# Patient Record
Sex: Female | Born: 1935
Health system: Southern US, Community
[De-identification: ages and names within clinical notes are randomized; demographics above are authoritative.]

## PROBLEM LIST (undated history)

## (undated) DIAGNOSIS — C439 Malignant melanoma of skin, unspecified: Secondary | ICD-10-CM

## (undated) DIAGNOSIS — E785 Hyperlipidemia, unspecified: Secondary | ICD-10-CM

## (undated) DIAGNOSIS — G20A1 Parkinson's disease without dyskinesia, without mention of fluctuations: Secondary | ICD-10-CM

## (undated) DIAGNOSIS — E039 Hypothyroidism, unspecified: Secondary | ICD-10-CM

## (undated) DIAGNOSIS — I10 Essential (primary) hypertension: Secondary | ICD-10-CM

## (undated) DIAGNOSIS — G629 Polyneuropathy, unspecified: Secondary | ICD-10-CM

## (undated) DIAGNOSIS — G20 Parkinson's disease: Secondary | ICD-10-CM

## (undated) DIAGNOSIS — I350 Nonrheumatic aortic (valve) stenosis: Secondary | ICD-10-CM

## (undated) DIAGNOSIS — K5909 Other constipation: Secondary | ICD-10-CM

## (undated) DIAGNOSIS — I00 Rheumatic fever without heart involvement: Secondary | ICD-10-CM

## (undated) HISTORY — DX: Essential (primary) hypertension: I10

## (undated) HISTORY — DX: Hyperlipidemia, unspecified: E78.5

## (undated) HISTORY — DX: Gilbert syndrome: E80.4

## (undated) HISTORY — DX: Malignant melanoma of skin, unspecified: C43.9

## (undated) HISTORY — DX: Hypothyroidism, unspecified: E03.9

## (undated) HISTORY — DX: Nonrheumatic aortic (valve) stenosis: I35.0

## (undated) HISTORY — DX: Polyneuropathy, unspecified: G62.9

## (undated) HISTORY — DX: Rheumatic fever without heart involvement: I00

---

## 1961-05-27 HISTORY — PX: ABDOMINAL HYSTERECTOMY: SHX81

## 1970-05-27 HISTORY — PX: HEMORRHOID SURGERY: SHX153

## 1998-05-27 HISTORY — PX: COLONOSCOPY: SHX174

## 1998-11-16 ENCOUNTER — Other Ambulatory Visit: Admission: RE | Admit: 1998-11-16 | Discharge: 1998-11-16 | Payer: Self-pay | Admitting: Obstetrics & Gynecology

## 1998-11-27 ENCOUNTER — Encounter: Payer: Self-pay | Admitting: Internal Medicine

## 2004-03-14 ENCOUNTER — Emergency Department (HOSPITAL_COMMUNITY): Admission: EM | Admit: 2004-03-14 | Discharge: 2004-03-14 | Payer: Self-pay | Admitting: Emergency Medicine

## 2004-03-22 ENCOUNTER — Other Ambulatory Visit: Admission: RE | Admit: 2004-03-22 | Discharge: 2004-03-22 | Payer: Self-pay | Admitting: Obstetrics & Gynecology

## 2004-04-17 ENCOUNTER — Ambulatory Visit: Payer: Self-pay | Admitting: Internal Medicine

## 2004-04-23 ENCOUNTER — Ambulatory Visit: Payer: Self-pay | Admitting: Internal Medicine

## 2004-05-04 ENCOUNTER — Ambulatory Visit: Payer: Self-pay | Admitting: Internal Medicine

## 2005-02-13 ENCOUNTER — Ambulatory Visit: Payer: Self-pay | Admitting: Internal Medicine

## 2005-05-09 ENCOUNTER — Ambulatory Visit: Payer: Self-pay | Admitting: Internal Medicine

## 2006-04-30 ENCOUNTER — Ambulatory Visit: Payer: Self-pay | Admitting: Internal Medicine

## 2006-04-30 LAB — CONVERTED CEMR LAB
ALT: 18 units/L (ref 0–40)
Alkaline Phosphatase: 28 units/L — ABNORMAL LOW (ref 39–117)
Basophils Relative: 0.5 % (ref 0.0–1.0)
Chol/HDL Ratio, serum: 2.9
GFR calc non Af Amer: 65 mL/min
Glomerular Filtration Rate, Af Am: 79 mL/min/{1.73_m2}
Glucose, Bld: 85 mg/dL (ref 70–99)
Monocytes Absolute: 0.4 10*3/uL (ref 0.2–0.7)
Platelets: 194 10*3/uL (ref 150–400)
Potassium: 3.8 meq/L (ref 3.5–5.1)
RBC: 4.83 M/uL (ref 3.87–5.11)
Sodium: 141 meq/L (ref 135–145)
Total Bilirubin: 1.5 mg/dL — ABNORMAL HIGH (ref 0.3–1.2)
Total Protein: 6.7 g/dL (ref 6.0–8.3)
VLDL: 8 mg/dL (ref 0–40)
WBC: 5.5 10*3/uL (ref 4.5–10.5)

## 2006-08-27 ENCOUNTER — Ambulatory Visit: Payer: Self-pay | Admitting: Internal Medicine

## 2006-08-27 LAB — CONVERTED CEMR LAB
Cholesterol: 187 mg/dL (ref 0–200)
HDL: 79.2 mg/dL (ref >39.0)
VLDL: 10 mg/dL (ref 0–40)

## 2006-11-13 ENCOUNTER — Encounter (INDEPENDENT_AMBULATORY_CARE_PROVIDER_SITE_OTHER): Payer: Self-pay | Admitting: *Deleted

## 2007-08-10 ENCOUNTER — Telehealth (INDEPENDENT_AMBULATORY_CARE_PROVIDER_SITE_OTHER): Payer: Self-pay | Admitting: *Deleted

## 2007-08-12 ENCOUNTER — Telehealth (INDEPENDENT_AMBULATORY_CARE_PROVIDER_SITE_OTHER): Payer: Self-pay | Admitting: *Deleted

## 2007-08-14 ENCOUNTER — Ambulatory Visit: Payer: Self-pay | Admitting: Internal Medicine

## 2007-08-14 LAB — CONVERTED CEMR LAB: Vit D, 1,25-Dihydroxy: 21 — ABNORMAL LOW (ref 30–89)

## 2007-08-19 LAB — CONVERTED CEMR LAB
ALT: 19 units/L (ref 0–35)
Albumin: 3.9 g/dL (ref 3.5–5.2)
Alkaline Phosphatase: 31 units/L — ABNORMAL LOW (ref 39–117)
BUN: 16 mg/dL (ref 6–23)
Basophils Absolute: 0 10*3/uL (ref 0.0–0.1)
Basophils Relative: 0.2 % (ref 0.0–1.0)
Calcium: 9.4 mg/dL (ref 8.4–10.5)
Cholesterol: 228 mg/dL (ref 0–200)
Eosinophils Absolute: 0.1 10*3/uL (ref 0.0–0.6)
GFR calc Af Amer: 90 mL/min
GFR calc non Af Amer: 75 mL/min
HDL: 95.3 mg/dL (ref >39.0)
Lymphocytes Relative: 23.2 % (ref 12.0–46.0)
MCV: 96.7 fL (ref 78.0–100.0)
Monocytes Relative: 6.3 % (ref 3.0–11.0)
Neutro Abs: 3.8 10*3/uL (ref 1.4–7.7)
Platelets: 172 10*3/uL (ref 150–400)
VLDL: 11 mg/dL (ref 0–40)

## 2007-08-20 ENCOUNTER — Encounter (INDEPENDENT_AMBULATORY_CARE_PROVIDER_SITE_OTHER): Payer: Self-pay | Admitting: *Deleted

## 2007-08-21 ENCOUNTER — Ambulatory Visit: Payer: Self-pay | Admitting: Internal Medicine

## 2007-08-21 DIAGNOSIS — E785 Hyperlipidemia, unspecified: Secondary | ICD-10-CM

## 2007-08-21 DIAGNOSIS — M810 Age-related osteoporosis without current pathological fracture: Secondary | ICD-10-CM

## 2007-08-21 DIAGNOSIS — E039 Hypothyroidism, unspecified: Secondary | ICD-10-CM | POA: Insufficient documentation

## 2007-08-21 DIAGNOSIS — I8393 Asymptomatic varicose veins of bilateral lower extremities: Secondary | ICD-10-CM | POA: Insufficient documentation

## 2007-08-21 LAB — CONVERTED CEMR LAB
Cholesterol, target level: 200 mg/dL
HDL goal, serum: 40 mg/dL

## 2007-08-28 ENCOUNTER — Encounter (INDEPENDENT_AMBULATORY_CARE_PROVIDER_SITE_OTHER): Payer: Self-pay | Admitting: *Deleted

## 2007-08-28 ENCOUNTER — Ambulatory Visit: Payer: Self-pay | Admitting: Internal Medicine

## 2007-08-29 LAB — CONVERTED CEMR LAB: Vit D, 1,25-Dihydroxy: 22 — ABNORMAL LOW (ref 30–89)

## 2007-08-31 ENCOUNTER — Encounter (INDEPENDENT_AMBULATORY_CARE_PROVIDER_SITE_OTHER): Payer: Self-pay | Admitting: *Deleted

## 2007-08-31 ENCOUNTER — Telehealth (INDEPENDENT_AMBULATORY_CARE_PROVIDER_SITE_OTHER): Payer: Self-pay | Admitting: *Deleted

## 2007-10-01 ENCOUNTER — Ambulatory Visit: Payer: Self-pay | Admitting: Internal Medicine

## 2007-10-01 DIAGNOSIS — E559 Vitamin D deficiency, unspecified: Secondary | ICD-10-CM

## 2007-10-01 LAB — CONVERTED CEMR LAB: HDL goal, serum: 50 mg/dL

## 2007-12-31 ENCOUNTER — Ambulatory Visit: Payer: Self-pay | Admitting: Internal Medicine

## 2008-01-04 ENCOUNTER — Encounter (INDEPENDENT_AMBULATORY_CARE_PROVIDER_SITE_OTHER): Payer: Self-pay | Admitting: *Deleted

## 2008-01-04 LAB — CONVERTED CEMR LAB: Vit D, 1,25-Dihydroxy: 35 (ref 30–89)

## 2008-06-29 ENCOUNTER — Ambulatory Visit: Payer: Self-pay | Admitting: Internal Medicine

## 2008-07-03 LAB — CONVERTED CEMR LAB: Vit D, 1,25-Dihydroxy: 26 — ABNORMAL LOW (ref 30–89)

## 2008-07-05 ENCOUNTER — Encounter (INDEPENDENT_AMBULATORY_CARE_PROVIDER_SITE_OTHER): Payer: Self-pay | Admitting: *Deleted

## 2008-07-05 ENCOUNTER — Telehealth (INDEPENDENT_AMBULATORY_CARE_PROVIDER_SITE_OTHER): Payer: Self-pay | Admitting: *Deleted

## 2008-11-16 ENCOUNTER — Telehealth (INDEPENDENT_AMBULATORY_CARE_PROVIDER_SITE_OTHER): Payer: Self-pay | Admitting: *Deleted

## 2009-01-02 ENCOUNTER — Encounter (INDEPENDENT_AMBULATORY_CARE_PROVIDER_SITE_OTHER): Payer: Self-pay | Admitting: *Deleted

## 2009-01-02 ENCOUNTER — Ambulatory Visit: Payer: Self-pay | Admitting: Internal Medicine

## 2009-01-02 LAB — CONVERTED CEMR LAB
HDL: 83 mg/dL (ref >39.00)
TSH: 1.63 microintl units/mL (ref 0.35–5.50)
Triglycerides: 44 mg/dL (ref 0.0–149.0)

## 2009-01-04 ENCOUNTER — Encounter: Payer: Self-pay | Admitting: Internal Medicine

## 2009-01-04 ENCOUNTER — Encounter (INDEPENDENT_AMBULATORY_CARE_PROVIDER_SITE_OTHER): Payer: Self-pay | Admitting: *Deleted

## 2009-01-16 ENCOUNTER — Telehealth (INDEPENDENT_AMBULATORY_CARE_PROVIDER_SITE_OTHER): Payer: Self-pay | Admitting: *Deleted

## 2009-04-11 ENCOUNTER — Telehealth (INDEPENDENT_AMBULATORY_CARE_PROVIDER_SITE_OTHER): Payer: Self-pay | Admitting: *Deleted

## 2009-04-18 ENCOUNTER — Ambulatory Visit: Payer: Self-pay | Admitting: Internal Medicine

## 2009-04-18 DIAGNOSIS — E041 Nontoxic single thyroid nodule: Secondary | ICD-10-CM | POA: Insufficient documentation

## 2009-04-18 DIAGNOSIS — T887XXA Unspecified adverse effect of drug or medicament, initial encounter: Secondary | ICD-10-CM

## 2009-04-18 DIAGNOSIS — M6740 Ganglion, unspecified site: Secondary | ICD-10-CM

## 2009-04-25 ENCOUNTER — Encounter: Admission: RE | Admit: 2009-04-25 | Discharge: 2009-04-25 | Payer: Self-pay | Admitting: Internal Medicine

## 2009-04-25 LAB — CONVERTED CEMR LAB
AST: 31 units/L (ref 0–37)
Albumin: 3.8 g/dL (ref 3.5–5.2)
Basophils Relative: 0.8 % (ref 0.0–3.0)
CO2: 30 meq/L (ref 19–32)
Calcium: 8.7 mg/dL (ref 8.4–10.5)
Cholesterol: 203 mg/dL — ABNORMAL HIGH (ref 0–200)
Creatinine, Ser: 0.9 mg/dL (ref 0.4–1.2)
Eosinophils Relative: 0.9 % (ref 0.0–5.0)
Hemoglobin: 14.1 g/dL (ref 12.0–15.0)
Lymphocytes Relative: 23.7 % (ref 12.0–46.0)
MCHC: 33.5 g/dL (ref 30.0–36.0)
Monocytes Relative: 5.6 % (ref 3.0–12.0)
Neutro Abs: 3.2 10*3/uL (ref 1.4–7.7)
Neutrophils Relative %: 69 % (ref 43.0–77.0)
RBC: 4.33 M/uL (ref 3.87–5.11)
Sodium: 144 meq/L (ref 135–145)
TSH: 4.33 microintl units/mL (ref 0.35–5.50)
Total CHOL/HDL Ratio: 2
Total Protein: 6.7 g/dL (ref 6.0–8.3)
Triglycerides: 35 mg/dL (ref 0.0–149.0)
VLDL: 7 mg/dL (ref 0.0–40.0)
Vit D, 25-Hydroxy: 22 ng/mL — ABNORMAL LOW (ref 30–89)
WBC: 4.6 10*3/uL (ref 4.5–10.5)

## 2009-04-26 ENCOUNTER — Encounter (INDEPENDENT_AMBULATORY_CARE_PROVIDER_SITE_OTHER): Payer: Self-pay | Admitting: *Deleted

## 2009-04-27 ENCOUNTER — Ambulatory Visit: Payer: Self-pay | Admitting: Internal Medicine

## 2009-04-27 LAB — CONVERTED CEMR LAB
OCCULT 1: NEGATIVE
OCCULT 2: NEGATIVE
OCCULT 3: NEGATIVE

## 2009-04-28 ENCOUNTER — Encounter (INDEPENDENT_AMBULATORY_CARE_PROVIDER_SITE_OTHER): Payer: Self-pay | Admitting: *Deleted

## 2009-06-16 ENCOUNTER — Telehealth (INDEPENDENT_AMBULATORY_CARE_PROVIDER_SITE_OTHER): Payer: Self-pay | Admitting: *Deleted

## 2009-07-03 ENCOUNTER — Telehealth: Payer: Self-pay | Admitting: Internal Medicine

## 2009-09-04 ENCOUNTER — Encounter (INDEPENDENT_AMBULATORY_CARE_PROVIDER_SITE_OTHER): Payer: Self-pay | Admitting: Nurse Practitioner

## 2010-02-28 ENCOUNTER — Telehealth (INDEPENDENT_AMBULATORY_CARE_PROVIDER_SITE_OTHER): Payer: Self-pay | Admitting: *Deleted

## 2010-03-20 ENCOUNTER — Telehealth (INDEPENDENT_AMBULATORY_CARE_PROVIDER_SITE_OTHER): Payer: Self-pay | Admitting: *Deleted

## 2010-04-10 ENCOUNTER — Telehealth: Payer: Self-pay | Admitting: Internal Medicine

## 2010-06-04 ENCOUNTER — Telehealth (INDEPENDENT_AMBULATORY_CARE_PROVIDER_SITE_OTHER): Payer: Self-pay | Admitting: *Deleted

## 2010-06-26 NOTE — Progress Notes (Signed)
Summary: MED CHANGE  BONIVAprior auth IN PROCESS [employer]BS  Phone Note Refill Request   Refills Requested: Medication #1:  ACTONEL 35 MG  TABS 1 by mouth q week prior auth [id number]  Initial call taken by: Jeremy Johann CMA,  July 03, 2009 4:30 PM  Follow-up for Phone Call        awaiting faxed............Marland KitchenFelecia Deloach CMA  July 03, 2009 4:52 PM    Additional Follow-up for Phone Call Additional follow up Details #1::        PREFERRED ORAL BISPHOSPHONATE: ALENDRONATE OR BONIVA. PT HAS NO RECORD OF TAKING ANYTHING BUT THE ACTONEL.dr hopper pls advise..............Marland KitchenFelecia Deloach CMA  July 03, 2009 4:57 PM     Additional Follow-up for Phone Call Additional follow up Details #2::    tell her insurance co's decision. Recommend Boniva 150 mg ONCE monthly in place of Actonel .#3 ,RX3  Additional Follow-up for Phone Call Additional follow up Details #3:: Details for Additional Follow-up Action Taken: pt aware, rx sent to pharmacy....................Marland KitchenFelecia Deloach CMA  July 04, 2009 10:16 AM   New/Updated Medications: BONIVA 150 MG TABS (IBANDRONATE SODIUM) 1 monthly Prescriptions: BONIVA 150 MG TABS (IBANDRONATE SODIUM) 1 monthly  #3 x 3   Entered and Authorized by:   Marga Melnick MD   Signed by:   Jeremy Johann CMA on 07/04/2009   Method used:   Faxed to ...       [facility]* (mail-order)             ,          Ph: [phone]       Fax: [phone]   RxID:   [id number]

## 2010-06-26 NOTE — Progress Notes (Signed)
Summary: REFILL PRAVASTATIN  Phone Note Refill Request Call back at Home Phone [phone] Message from:  Patient on April 10, 2010 9:54 AM  Refills Requested: Medication #1:  PRAVASTATIN SODIUM 20 MG  TABS 1 by mouth once daily**LABS DUE NOW**   Last Refilled: 01/22/2010   Notes: PER PATIENT SEND ORDER TO [facility], NOT TO LOCAL PHARMACY Initial call taken by: Magdalen Spatz [facility],  April 10, 2010 9:54 AM    Prescriptions: PRAVASTATIN SODIUM 20 MG  TABS (PRAVASTATIN SODIUM) 1 by mouth once daily**LABS DUE NOW**  #90 Tablet x 0   Entered by:   Shonna Chock CMA   Authorized by:   Marga Melnick MD   Signed by:   Shonna Chock CMA on 04/10/2010   Method used:   Faxed to ...       [facility]* (retail)             ,          Ph: [phone]       Fax: [phone]   RxID:   [id number]

## 2010-06-26 NOTE — Letter (Signed)
Summary: RHEUMATOLOGY & CLINICAL IMMUNOLOGY  RHEUMATOLOGY & CLINICAL IMMUNOLOGY   Imported By: Arta Bruce 11/08/2009 12:26:48  _____________________________________________________________________  External Attachment:    Type:   Image     Comment:   External Document

## 2010-06-26 NOTE — Progress Notes (Signed)
Summary: BONIVA REFILL   Phone Note Refill Request Call back at Home Phone [phone] Message from:  Patient on March 20, 2010 8:45 AM  Refills Requested: Medication #1:  BONIVA 150 MG TABS 1 monthly. NEEDS TO GO TO [facility]  Initial call taken by: Jerolyn Shin,  March 20, 2010 8:46 AM  Follow-up for Phone Call        Left message on machine for patient to return call when avaliable, Reason for call:   We filled Boniva on 02/28/10, ? if patient checked with [facility] to see if med already shipped Follow-up by: Shonna Chock CMA,  March 20, 2010 10:47 AM  Additional Follow-up for Phone Call Additional follow up Details #1::        Left message on machine for patient to return call when avaliable, Reason for call:  see above Additional Follow-up by: Shonna Chock CMA,  March 20, 2010 3:47 PM    Additional Follow-up for Phone Call Additional follow up Details #2::    I spoke with patient and she indicated that she did recently receive a box with 3 but thinks the pharmacy left off the three refills, patient indicated she will check with [facility] and call the office back if needed   Shonna Chock CMA  March 21, 2010 1:13 PM

## 2010-06-26 NOTE — Progress Notes (Signed)
Summary: boniva refill   Phone Note Refill Request Message from:  Patient  Refills Requested: Medication #1:  BONIVA 150 MG TABS 1 monthly. [facility]  Initial call taken by: Okey Regal [place],  February 28, 2010 9:58 AM    Prescriptions: BONIVA 150 MG TABS (IBANDRONATE SODIUM) 1 monthly  #3 x 3   Entered by:   Doristine Devoid CMA   Authorized by:   Marga Melnick MD   Signed by:   Doristine Devoid CMA on 02/28/2010   Method used:   Electronically to        [facility]* (retail)             ,          Ph: [phone]       Fax: [phone]   RxID:   [id number]

## 2010-06-26 NOTE — Progress Notes (Signed)
Summary: refill  Phone Note Refill Request Message from:  Fax from Pharmacy on [facility] fax [phone]  Refills Requested: Medication #1:  ACTONEL 35 MG  TABS 1 by mouth q week Initial call taken by: Barb Merino,  June 16, 2009 8:55 AM    Prescriptions: ACTONEL 35 MG  TABS (RISEDRONATE SODIUM) 1 by mouth q week  #12 Tablet x 3   Entered by:   Shonna Chock   Authorized by:   Marga Melnick MD   Signed by:   Shonna Chock on 06/16/2009   Method used:   Faxed to ...       [facility]* (mail-order)             ,          Ph: [phone]       Fax: [phone]   RxID:   [id number]

## 2010-06-27 ENCOUNTER — Ambulatory Visit: Admit: 2010-06-27 | Payer: Self-pay | Admitting: Internal Medicine

## 2010-06-27 ENCOUNTER — Encounter: Payer: Self-pay | Admitting: Internal Medicine

## 2010-06-27 ENCOUNTER — Encounter (INDEPENDENT_AMBULATORY_CARE_PROVIDER_SITE_OTHER): Payer: Medicare Other | Admitting: Internal Medicine

## 2010-06-27 ENCOUNTER — Other Ambulatory Visit: Payer: Self-pay | Admitting: Internal Medicine

## 2010-06-27 DIAGNOSIS — Z0000 Encounter for general adult medical examination without abnormal findings: Secondary | ICD-10-CM

## 2010-06-27 DIAGNOSIS — K219 Gastro-esophageal reflux disease without esophagitis: Secondary | ICD-10-CM

## 2010-06-27 DIAGNOSIS — Z136 Encounter for screening for cardiovascular disorders: Secondary | ICD-10-CM

## 2010-06-27 DIAGNOSIS — E039 Hypothyroidism, unspecified: Secondary | ICD-10-CM

## 2010-06-27 DIAGNOSIS — E785 Hyperlipidemia, unspecified: Secondary | ICD-10-CM

## 2010-06-27 DIAGNOSIS — E559 Vitamin D deficiency, unspecified: Secondary | ICD-10-CM

## 2010-06-27 DIAGNOSIS — M810 Age-related osteoporosis without current pathological fracture: Secondary | ICD-10-CM

## 2010-06-27 LAB — HEPATIC FUNCTION PANEL
Albumin: 3.9 g/dL (ref 3.5–5.2)
Bilirubin, Direct: 0.3 mg/dL (ref 0.0–0.3)
Total Protein: 6.3 g/dL (ref 6.0–8.3)

## 2010-06-27 LAB — LIPID PANEL
Cholesterol: 206 mg/dL — ABNORMAL HIGH (ref 0–200)
Total CHOL/HDL Ratio: 2
Triglycerides: 29 mg/dL (ref 0.0–149.0)

## 2010-06-27 LAB — CBC WITH DIFFERENTIAL/PLATELET
Basophils Absolute: 0 10*3/uL (ref 0.0–0.1)
Basophils Relative: 0.4 % (ref 0.0–3.0)
Eosinophils Absolute: 0 10*3/uL (ref 0.0–0.7)
Lymphocytes Relative: 26.4 % (ref 12.0–46.0)
MCHC: 34.5 g/dL (ref 30.0–36.0)
MCV: 96.5 fl (ref 78.0–100.0)
Monocytes Absolute: 0.3 10*3/uL (ref 0.1–1.0)
Neutro Abs: 3 10*3/uL (ref 1.4–7.7)
Neutrophils Relative %: 66.1 % (ref 43.0–77.0)
RBC: 4.42 Mil/uL (ref 3.87–5.11)
RDW: 13.3 % (ref 11.5–14.6)

## 2010-06-27 LAB — BASIC METABOLIC PANEL
CO2: 30 mEq/L (ref 19–32)
Calcium: 9 mg/dL (ref 8.4–10.5)
Chloride: 106 mEq/L (ref 96–112)
Creatinine, Ser: 0.8 mg/dL (ref 0.4–1.2)
Glucose, Bld: 73 mg/dL (ref 70–99)

## 2010-06-27 LAB — LDL CHOLESTEROL, DIRECT: Direct LDL: 94.8 mg/dL

## 2010-06-28 LAB — CONVERTED CEMR LAB: Vit D, 25-Hydroxy: 28 ng/mL — ABNORMAL LOW (ref 30–89)

## 2010-06-28 NOTE — Progress Notes (Signed)
Summary: RX  Phone Note Refill Request Call back at Home Phone [phone] Message from:  Patient  Refills Requested: Medication #1:  BONIVA 150 MG TABS 1 monthly.   Dosage confirmed as above?Dosage Confirmed   Supply Requested: 3 months MAIL TO [facility] WITH A NEW SCRIPT  Initial call taken by: Freddy Jaksch,  June 04, 2010 9:36 AM    Prescriptions: BONIVA 150 MG TABS (IBANDRONATE SODIUM) 1 monthly  #3 x 2   Entered by:   Shonna Chock CMA   Authorized by:   Marga Melnick MD   Signed by:   Shonna Chock CMA on 06/04/2010   Method used:   Faxed to ...       [facility]* (retail)             ,          Ph: [phone]       Fax: [phone]   RxID:   [id number]

## 2010-07-04 NOTE — Assessment & Plan Note (Signed)
Summary: complete physical/renew meds//kn---confirmed  1/30   ///sph   Vital Signs:  Patient profile:   77 year old female Height:      61.25 inches Weight:      118.8 pounds BMI:     22.34 Temp:     97.9 degrees F oral Pulse rate:   72 / minute Resp:     15 per minute BP sitting:   134 / 80  (left arm) Cuff size:   regular  Vitals Entered By: Shonna Chock CMA (June 27, 2010 9:16 AM) CC: CPX with fasting labs  Comments Patient states she had a TD vaccine (less than 10 years ago) due to leg injury. Patient will check home record.  Vision Screening:Left eye with correction: 20 / 25 Right eye with correction: 20 / 25 Both eyes with correction: 20 / 25        Vision Entered By: Shonna Chock CMA (June 27, 2010 9:19 AM)   CC:  CPX with fasting labs .  History of Present Illness: Here for Medicare AWV: 1.Risk factors based on Past M, S, F history:see Diagnoses; chart updated 2.Physical Activities: physically active , still working 3.Depression/mood: no issues 4.Hearing: whisper heard @ 6 ft 5.ADL's:no limitations  6.Fall Risk: no issues 7.Home Safety: safety proofed, burglar alarm in place 8.Height, weight, &visual acuity:see VS 9.Counseling: POA & Living Will in place; Pneumovax declined; she does not take Flu shot 10.Labs ordered based on risk factors: see Orders 11. Referral Coordination: none requested ; Mammograms & Gyn appt (Dr Aldona Bar) every 2 years  12. Care Plan: see Instructions 13.Cognitive Assessment: Oriented X3 ; memory & recall excellent   ; "WORLD " spelled backwards; mood & affect normal.    Hyperlipidemia Follow-Up: The patient denies muscle aches, GI upset, abdominal pain, flushing, itching, constipation, diarrhea, and fatigue.  The patient denies the following symptoms: chest pain/pressure, dypsnea, palpitations, syncope, and pedal edema.  Compliance with medications (by patient report) has been near 100%.  Dietary compliance has been good.     Preventive Screening-Counseling & Management  Alcohol-Tobacco     Alcohol drinks/day: 0     Smoking Status: never  Caffeine-Diet-Exercise     Caffeine use/day: 21-2 cups / day  Hep-HIV-STD-Contraception     Dental Visit-last 6 months annually     Sun Exposure-Excessive: no  Safety-Violence-Falls     Seat Belt Use: yes     Smoke Detectors: yes      Blood Transfusions:  no.        Travel History:  [place] remotely.    Current Medications (verified): 1)  Pravastatin Sodium 20 Mg  Tabs (Pravastatin Sodium) .Marland Kitchen.. 1 By Mouth Once Daily**labs Due Now** 2)  Levothroid 75 Mcg  Tabs (Levothyroxine Sodium) .Marland Kitchen.. 1 By Mouth Qd 3)  Align   Caps (Misc Intestinal Flora Regulat) .Marland Kitchen.. 1 By Mouth Once Daily 4)  Multivitamins  Tabs (Multiple Vitamin) .Marland Kitchen.. 1 By Mouth Once Daily 5)  Vitamin D (Ergocalciferol) 5000 Unit Caps (Ergocalciferol) .Marland Kitchen.. 1 Once Daily 6)  Boniva 150 Mg Tabs (Ibandronate Sodium) .Marland Kitchen.. 1 Monthly  Allergies: 1)  ! Sulfa 2)  ! * Alinda Money  Past History:  Past Medical History:  HYPERLIPIDEMIA (ICD-272.4): LDL 189(1227/0 small dense), HDL 85, TG 77. LDL goal = < 150. Osteoporosis: T score -3.0 @ LS spine , -2.4 @ hip in 2000 Hypothyroidism Past history of rheumatic fever Gilbert's syndrome  Past Surgical History: Hysterectomy @ 40, no HRT Hemorrhoidectomy Flex sigmoid 1999;  Colonoscopy negative  Family History: brother: elevated lipids mother : CVA, Alzheimer's M uncle: lung cancer son :multiple myeloma son: DVT   Social History: Never Smoked Occupation:VP/ Designer, industrial/product & Security Alcohol use-no Caffeine use/day:  21-2 cups / day Dental Care w/in 6 mos.:  annually Sun Exposure-Excessive:  no Seat Belt Use:  yes Blood Transfusions:  no  Review of Systems  The patient denies anorexia, fever, weight loss, weight gain, vision loss, decreased hearing, hoarseness, prolonged cough, hemoptysis, melena, hematochezia, severe indigestion/heartburn,  hematuria, suspicious skin lesions, unusual weight change, abnormal bleeding, enlarged lymph nodes, and angioedema.         Reflux symptoms with greasey foods , onions & spicey foods. Derm:  Denies changes in nail beds, dryness, and hair loss. Neuro:  Denies numbness and tingling. Endo:  No temperature intolerance.  Physical Exam  General:  well-nourished, appears younger than age; alert,appropriate and cooperative throughout examination Head:  Normocephalic and atraumatic without obvious abnormalities. Eyes:  No corneal or conjunctival inflammation noted.Perrla. Funduscopic exam benign, without hemorrhages, exudates or papilledema. No lid lag Ears:  External ear exam shows no significant lesions or deformities.  Otoscopic examination reveals clear canals, tympanic membranes are intact bilaterally without bulging, retraction, inflammation or discharge. Hearing is grossly normal bilaterally. Nose:  External nasal examination shows no deformity or inflammation. Nasal mucosa are pink and moist without lesions or exudates. Septum deviated to L  Mouth:  Oral mucosa and oropharynx without lesions or exudates.  Teeth in good repair. Neck:  No deformities, masses, or tenderness noted. Lungs:  Normal respiratory effort, chest expands symmetrically. Lungs are clear to auscultation, no crackles or wheezes. Heart:  normal rate, regular rhythm, no gallop, no rub, no JVD, no HJR, and grade 1 /6 systolic murmur @ base.   Abdomen:  Bowel sounds positive,abdomen soft and non-tender without masses, organomegaly or hernias noted. No AAA Genitalia:  Dr. Aldona Bar Msk:  Slight lordosis thoracic spine Pulses:  R and L carotid,radial,dorsalis pedis and posterior tibial pulses are full and equal bilaterally Extremities:  No clubbing, cyanosis, edema. Minor OA hand changes  noted with normal full range of motion of all joints.  Mild crepitus of knees Neurologic:  alert & oriented X3 and DTRs symmetrical and normal.  no  tremor Skin:  Intact without suspicious lesions or rashes. Plethora of hands Cervical Nodes:  No lymphadenopathy noted Axillary Nodes:  No palpable lymphadenopathy Psych:  memory intact for recent and remote, normally interactive, and good eye contact.     Impression & Recommendations:  Problem # 1:  PREVENTIVE HEALTH CARE (ICD-V70.0)  Orders: Medicare -1st Annual Wellness Visit [phone])  Problem # 2:  HYPERLIPIDEMIA (ICD-272.4)  Her updated medication list for this problem includes:    Pravastatin Sodium 20 Mg Tabs (Pravastatin sodium) .Marland Kitchen... 1 by mouth once daily**labs due now**  Orders: EKG w/ Interpretation (93000) Venipuncture (60454) TLB-Lipid Panel (80061-LIPID) TLB-BMP (Basic Metabolic Panel-BMET) (80048-METABOL) TLB-Hepatic/Liver Function Pnl (80076-HEPATIC)  Problem # 3:  HYPOTHYROIDISM (ICD-244.9)  Her updated medication list for this problem includes:    Levothroid 75 Mcg Tabs (Levothyroxine sodium) .Marland Kitchen... 1 by mouth qd  Orders: Venipuncture (09811) TLB-TSH (Thyroid Stimulating Hormone) (84443-TSH)  Problem # 4:  OSTEOPOROSIS (ICD-733.00)  Her updated medication list for this problem includes:    Boniva 150 Mg Tabs (Ibandronate sodium) .Marland Kitchen... 1 monthly  Orders: Venipuncture (91478)  Problem # 5:  VITAMIN D DEFICIENCY (ICD-268.9)  Orders: Venipuncture (29562) T-Vitamin D (25-Hydroxy) (13086-57846)  Problem # 6:  ESOPHAGEAL REFLUX (ICD-530.81)  Orders: TLB-CBC Platelet - w/Differential (85025-CBCD)  Complete Medication List: 1)  Pravastatin Sodium 20 Mg Tabs (Pravastatin sodium) .Marland Kitchen.. 1 by mouth once daily**labs due now** 2)  Levothroid 75 Mcg Tabs (Levothyroxine sodium) .Marland Kitchen.. 1 by mouth qd 3)  Align Caps (Misc intestinal flora regulat) .Marland Kitchen.. 1 by mouth once daily 4)  Multivitamins Tabs (Multiple vitamin) .Marland Kitchen.. 1 by mouth once daily 5)  Vitamin D (ergocalciferol) 5000 Unit Caps (ergocalciferol)  .Marland Kitchen.. 1 once daily 6)  Boniva 150 Mg Tabs (Ibandronate  sodium) .Marland Kitchen.. 1 monthly  Patient Instructions: 1)  Please consider the preventive measures as discussed.   Orders Added: 1)  Medicare -1st Annual Wellness Visit [G0438] 2)  Est. Patient Level III [16109] 3)  EKG w/ Interpretation [93000] 4)  Venipuncture [36415] 5)  TLB-Lipid Panel [80061-LIPID] 6)  TLB-BMP (Basic Metabolic Panel-BMET) [80048-METABOL] 7)  TLB-CBC Platelet - w/Differential [85025-CBCD] 8)  TLB-Hepatic/Liver Function Pnl [80076-HEPATIC] 9)  TLB-TSH (Thyroid Stimulating Hormone) [84443-TSH] 10)  T-Vitamin D (25-Hydroxy) [60454-09811]     Appended Document: complete physical/renew meds//kn---confirmed  1/30   ///sph

## 2010-07-27 ENCOUNTER — Telehealth (INDEPENDENT_AMBULATORY_CARE_PROVIDER_SITE_OTHER): Payer: Self-pay | Admitting: *Deleted

## 2010-08-02 NOTE — Progress Notes (Signed)
Summary: refill request   Phone Note Refill Request Message from:  Fax from Pharmacy  Refills Requested: Medication #1:  LEVOTHROID 75 MCG  TABS 1 by mouth qd [facility] [phone]   Method Requested: Fax to Mail Away Pharmacy Initial call taken by: Shonna Chock CMA,  July 27, 2010 10:04 AM    Prescriptions: LEVOTHROID 75 MCG  TABS (LEVOTHYROXINE SODIUM) 1 by mouth qd  #90 Tablet x 3   Entered by:   Shonna Chock CMA   Authorized by:   Marga Melnick MD   Signed by:   Shonna Chock CMA on 07/27/2010   Method used:   Faxed to ...       [facility]* (retail)             ,          Ph: [phone]       Fax: [phone]   RxID:   [id number]

## 2011-02-15 ENCOUNTER — Other Ambulatory Visit: Payer: Self-pay | Admitting: Internal Medicine

## 2011-02-15 MED ORDER — IBANDRONATE SODIUM 150 MG PO TABS: 150.0000 mg | ORAL_TABLET | ORAL | Status: DC

## 2011-02-15 NOTE — Telephone Encounter (Signed)
Patient needs refill for  boniva (generic) - [facility]

## 2011-03-01 ENCOUNTER — Encounter: Payer: Self-pay | Admitting: Internal Medicine

## 2011-04-16 ENCOUNTER — Ambulatory Visit: Payer: Medicare Other | Admitting: Internal Medicine

## 2011-04-22 ENCOUNTER — Encounter: Payer: Self-pay | Admitting: Internal Medicine

## 2011-04-26 ENCOUNTER — Encounter: Payer: Self-pay | Admitting: Internal Medicine

## 2011-04-26 ENCOUNTER — Ambulatory Visit (INDEPENDENT_AMBULATORY_CARE_PROVIDER_SITE_OTHER): Payer: Medicare Other | Admitting: Internal Medicine

## 2011-04-26 DIAGNOSIS — M810 Age-related osteoporosis without current pathological fracture: Secondary | ICD-10-CM

## 2011-04-26 DIAGNOSIS — E559 Vitamin D deficiency, unspecified: Secondary | ICD-10-CM

## 2011-04-26 DIAGNOSIS — E039 Hypothyroidism, unspecified: Secondary | ICD-10-CM

## 2011-04-26 DIAGNOSIS — E785 Hyperlipidemia, unspecified: Secondary | ICD-10-CM

## 2011-04-26 LAB — HEPATIC FUNCTION PANEL
ALT: 18 U/L (ref 0–35)
AST: 34 U/L (ref 0–37)
Albumin: 3.8 g/dL (ref 3.5–5.2)
Alkaline Phosphatase: 29 U/L — ABNORMAL LOW (ref 39–117)
Total Protein: 6.8 g/dL (ref 6.0–8.3)

## 2011-04-26 LAB — BASIC METABOLIC PANEL
Calcium: 8.7 mg/dL (ref 8.4–10.5)
Creatinine, Ser: 0.7 mg/dL (ref 0.4–1.2)
Sodium: 142 mEq/L (ref 135–145)

## 2011-04-26 LAB — LIPID PANEL
Cholesterol: 203 mg/dL — ABNORMAL HIGH (ref 0–200)
HDL: 84.6 mg/dL (ref >39.00)
Triglycerides: 46 mg/dL (ref 0.0–149.0)

## 2011-04-26 NOTE — Patient Instructions (Signed)
Please see if your insurance company will cover Reclast for osteoporosis. Please stop the oral agent as you're showing some bone loss despite continuous medicine. 5 years should be optimal therapy with oral agent.

## 2011-04-26 NOTE — Progress Notes (Signed)
  Subjective:    Patient ID: Natalie Lam, female    DOB: July 11, 1931, 77 y.o.   MRN: [id number]  HPI Dyslipidemia assessment: Prior Advanced Lipid Testing: NMR Lipoprofile.   Family history of premature CAD/ MI: no MI .  Nutrition: heart healthy .  Exercise: yard work, walking  . Diabetes : no . HTN: no. Smoking history  : never .   Weight :  Slightly down , worried about son.  Thyroid function monitor  Medications status(change in dose/brand/mode of administration):no change Constitutional:  Fatigue:no; Sleep pattern:good; Appetite:down due to worry  Visual change(blurred/diplopia/visual loss):no Hoarseness:no; Swallowing issues:no Cardiovascular: Palpitations:no; Racing:no; Irregularity:no Derm: Change in nails/hair/skin:no Neuro: Numbness/tingling:no; Tremor:no Psych: Anxiety:no; Depression:no Endo: Temperature intolerance: Heat:no; Cold:no      Review of Systems ROS:  chest pain : no ;claudication: no;  abd pain/bowel changes: no ; myalgias:no;   memory loss: no Lab results reviewed        Objective:   Physical Exam  Gen.: Thin but well-nourished; in no acute distress Eyes: Extraocular motion intact; no lid lag or proptosis Neck: full ROM ; R lobe > L w/o nodules Heart: Normal rhythm and rate without  gallop, or extra heart sounds. Grade 1/6 soft LSB systolic murmur Lungs: Chest clear to auscultation without rales,rales, wheezes Neuro:Deep tendon reflexes are equal and within normal limits; no tremor  Skin: Warm and dry without significant lesions or rashes; no onycholysis Psych: Normally communicative and interactive; no abnormal mood or affect clinically.        Assessment & Plan:  #1  see Problem List with Assessments & Recommendations  #2 she has had @ least 6.5  years of oral bisphosphonates; there has been some decrease in bone density at the hip despite ongoing therapy. Parenteral therapy option should be assessed. Adequate vitamin D replacement is  essential  Plan: see Orders

## 2011-04-30 NOTE — Progress Notes (Signed)
  Subjective:    Patient ID: Natalie Lam, female    DOB: 1931/11/16, 77 y.o.   MRN: [id number]  HPI    Review of Systems     Objective:   Physical Exam        Assessment & Plan:

## 2011-04-30 NOTE — Progress Notes (Signed)
  Subjective:    Patient ID: Natalie Lam, female    DOB: May 09, 1932, 77 y.o.   MRN: [id number]  HPI    Review of Systems     Objective:   Physical Exam        Assessment & Plan:  Problems addressed include the osteoporosis which is progressive despite therapy. The bisphosphonate will be discontinued. Research will be made as to which parenteral agent is covered by insurance.

## 2011-05-27 ENCOUNTER — Encounter: Payer: Self-pay | Admitting: Internal Medicine

## 2011-07-01 ENCOUNTER — Telehealth: Payer: Self-pay

## 2011-07-01 NOTE — Telephone Encounter (Signed)
Left message on voicemail for patient to return call to discuss if she would like to consider Reclast (BONE BUILDING THERAPY), if yes we need to schedule patient an appointment for BMP (DX: Osteoprosis), once labs received order and lab results to be faxed to facility of patient's choice [facility] or [facility]

## 2011-07-02 NOTE — Telephone Encounter (Signed)
Patient returned phone call and states that she does not want to have Reclast at this time.

## 2011-07-02 NOTE — Telephone Encounter (Signed)
Noted  

## 2011-08-22 ENCOUNTER — Other Ambulatory Visit: Payer: Self-pay | Admitting: Internal Medicine

## 2011-08-22 MED ORDER — PRAVASTATIN SODIUM 20 MG PO TABS: 20.0000 mg | ORAL_TABLET | Freq: Every day | ORAL | Status: DC

## 2011-08-22 MED ORDER — LEVOTHYROXINE SODIUM 75 MCG PO TABS: 75.0000 ug | ORAL_TABLET | Freq: Every day | ORAL | Status: DC

## 2011-08-22 NOTE — Telephone Encounter (Signed)
Refills for  Pravastatin tab 20mg  qty 90, 1PO QD &  Levothyroxine Tab qty 90, 1PO QD Doesn't show written here

## 2012-01-06 ENCOUNTER — Telehealth: Payer: Self-pay | Admitting: Internal Medicine

## 2012-01-06 MED ORDER — LEVOTHYROXINE SODIUM 75 MCG PO TABS: 75.0000 ug | ORAL_TABLET | Freq: Every day | ORAL | Status: DC

## 2012-01-06 MED ORDER — PRAVASTATIN SODIUM 20 MG PO TABS: 20.0000 mg | ORAL_TABLET | Freq: Every day | ORAL | Status: DC

## 2012-01-06 NOTE — Telephone Encounter (Signed)
Refill: Levothyroxine tablet. Take 1 by mouth daily. 90 day supply Pravastatin 20mg  tablet. Take 1 by mouth daily. 90 day supply

## 2012-01-06 NOTE — Telephone Encounter (Signed)
RXs sent.

## 2012-04-08 ENCOUNTER — Other Ambulatory Visit: Payer: Self-pay | Admitting: Internal Medicine

## 2012-04-08 NOTE — Telephone Encounter (Signed)
refill levothyrox (0.075M) tablet, take one by mouth daily -- requesting 90 day supply, last wrt 8.12.13 #90-wt/1-refill --NOTE OV scheduled for 11.21.13 NOT A CPE

## 2012-04-08 NOTE — Telephone Encounter (Signed)
Spoke with patient, patient states she has an appointment on 04/16/2012 and she will further follow-up then

## 2012-04-08 NOTE — Telephone Encounter (Signed)
Left message on voicemail for patient to return call when available. Reason for call rx was filled 12/2011, additional refills not due until 06/2012

## 2012-04-16 ENCOUNTER — Encounter: Payer: Self-pay | Admitting: Internal Medicine

## 2012-04-16 ENCOUNTER — Ambulatory Visit (INDEPENDENT_AMBULATORY_CARE_PROVIDER_SITE_OTHER): Payer: Medicare Other | Admitting: Internal Medicine

## 2012-04-16 VITALS — BP 138/88 | HR 69 | Temp 97.6°F | Wt 111.0 lb

## 2012-04-16 DIAGNOSIS — E559 Vitamin D deficiency, unspecified: Secondary | ICD-10-CM

## 2012-04-16 DIAGNOSIS — R748 Abnormal levels of other serum enzymes: Secondary | ICD-10-CM

## 2012-04-16 DIAGNOSIS — E785 Hyperlipidemia, unspecified: Secondary | ICD-10-CM

## 2012-04-16 DIAGNOSIS — M810 Age-related osteoporosis without current pathological fracture: Secondary | ICD-10-CM

## 2012-04-16 DIAGNOSIS — E039 Hypothyroidism, unspecified: Secondary | ICD-10-CM | POA: Diagnosis not present

## 2012-04-16 DIAGNOSIS — E876 Hypokalemia: Secondary | ICD-10-CM | POA: Diagnosis not present

## 2012-04-16 LAB — HEPATIC FUNCTION PANEL
AST: 29 U/L (ref 0–37)
Bilirubin, Direct: 0.2 mg/dL (ref 0.0–0.3)
Total Bilirubin: 1.2 mg/dL (ref 0.3–1.2)

## 2012-04-16 LAB — CK: Total CK: 82 U/L (ref 7–177)

## 2012-04-16 LAB — LIPID PANEL
Cholesterol: 223 mg/dL — ABNORMAL HIGH (ref 0–200)
Total CHOL/HDL Ratio: 2
Triglycerides: 38 mg/dL (ref 0.0–149.0)
VLDL: 7.6 mg/dL (ref 0.0–40.0)

## 2012-04-16 LAB — BASIC METABOLIC PANEL
Chloride: 102 mEq/L (ref 96–112)
Creatinine, Ser: 0.9 mg/dL (ref 0.4–1.2)
Potassium: 3.6 mEq/L (ref 3.5–5.1)
Sodium: 140 mEq/L (ref 135–145)

## 2012-04-16 MED ORDER — PRAVASTATIN SODIUM 20 MG PO TABS: 20.0000 mg | ORAL_TABLET | Freq: Every day | ORAL | Status: DC

## 2012-04-16 MED ORDER — LEVOTHYROXINE SODIUM 75 MCG PO TABS: 75.0000 ug | ORAL_TABLET | Freq: Every day | ORAL | Status: DC

## 2012-04-16 NOTE — Assessment & Plan Note (Signed)
I have recommended that she consider week last or early parenteral medications. She is not a candidate for bisphosphonates because of the 6.5 years of bisphosphonates and her age

## 2012-04-16 NOTE — Patient Instructions (Addendum)
If you activate My Chart; the results can be released to you as soon as they populate from the lab. If you choose not to use this program; the labs have to be reviewed, copied & mailed   causing a delay in getting the results to you.   Please consider audiology evaluation for hearing evaluation status.  Please check with your insurance company to see if they cover Reclast or Prolia, agents used to treat osteoporosis.

## 2012-04-16 NOTE — Assessment & Plan Note (Signed)
Lipids will be reassessed along with liver function and CPK

## 2012-04-16 NOTE — Progress Notes (Signed)
  Subjective:    Patient ID: Natalie Lam, female    DOB: 1932-05-06, 78 y.o.   MRN: [id number]  HPI  #1 Thyroid function monitor : There's been no change in the dose or mode of administration of her thyroid. She has a history of left thyroid nodule; thyroid ultrasound 04/25/09 revealed no definite nodule. Her last TSH was 3.33 on 04/26/11     #2  Dyslipidemia assessment: She's physically active walking daily 20-30 minutes. She is a low-fat low-sodium diet. Prior advanced cholesterol testing reveals her LDL goal be less than 160. There is no premature coronary disease in family. There's also no history of diabetes, hypertension, or smoking.  #3 hypokalemia: Her potassium was 3.2 on 04/26/11. She is on no diuretics and takes no laxatives.  #4 osteoporosis. Her last bone density was 02/15/11; this revealed significant osteoporosis at the radius with a T score of -3.6. She has had over 5 years of bisphosphonates. Her vitamin D level was last checked 04/26/11 and was in the low therapeutic range at 41.    Review of Systems  Constitutional: Weight change: no; Fatigue:no; Sleep pattern:no; Appetite:no  Visual change(blurred/diplopia/visual loss):no Hoarseness:no; Swallowing issues:no Cardiovascular: Palpitations:no; Racing:no; Irregularity:no GI: Constipation:no; Diarrhea:no Derm: Change in nails/hair/skin:no Neuro: Numbness/tingling:no; Tremor:no Psych: Anxiety:no; Depression:no; Panic attacks:no Endo: Temperature intolerance: Heat:no; Cold:no     Objective:   Physical Exam      Gen.: Thin but well-nourished; in no acute distress Eyes: Extraocular motion intact; no lid lag or proptosis Ears: patent canals; dull R TM; decreased hearing Neck : small thyroid w/o nodules Heart: Normal rhythm and rate without  gallop or extra heart sounds. Grade 1.5/6 systolic murmur  With neck radiation Lungs: Chest clear to auscultation without rales,rales, wheezes Neuro:Deep tendon reflexes are equal  and within normal limits; no tremor  Skin: Warm and dry without significant lesions or rashes; no onycholysis Psych: Normally communicative and interactive; no abnormal mood or affect clinically.                                                                           Assessment & Plan:  1). Hypothyoroidism: Continue taking synthroid 31mcg1 tablet daily.  2). Dyslipidemia: Pravastatin 20mg  1tablet daily 3). Osteoporosis  4). Hearing evaluation:offered  referral to Audiologist for hearing evaluation but declined . Educated pt about cleaning ear with mineral oil and hydrogen peroxide rather than Q-tips.

## 2012-04-16 NOTE — Assessment & Plan Note (Signed)
No nodules are palpable on exam today

## 2012-04-16 NOTE — Assessment & Plan Note (Signed)
TSH should be checked; excessive supplementation should be avoided because of the osteoporosis

## 2012-04-21 ENCOUNTER — Other Ambulatory Visit: Payer: Self-pay

## 2012-04-21 DIAGNOSIS — E785 Hyperlipidemia, unspecified: Secondary | ICD-10-CM

## 2012-04-21 MED ORDER — LEVOTHYROXINE SODIUM 75 MCG PO TABS: ORAL_TABLET | ORAL | Status: DC

## 2012-06-29 DIAGNOSIS — L570 Actinic keratosis: Secondary | ICD-10-CM | POA: Diagnosis not present

## 2012-06-29 DIAGNOSIS — L821 Other seborrheic keratosis: Secondary | ICD-10-CM | POA: Diagnosis not present

## 2012-06-29 DIAGNOSIS — Z85828 Personal history of other malignant neoplasm of skin: Secondary | ICD-10-CM | POA: Diagnosis not present

## 2012-06-29 DIAGNOSIS — D485 Neoplasm of uncertain behavior of skin: Secondary | ICD-10-CM | POA: Diagnosis not present

## 2012-07-06 ENCOUNTER — Other Ambulatory Visit (INDEPENDENT_AMBULATORY_CARE_PROVIDER_SITE_OTHER): Payer: Medicare Other

## 2012-07-06 ENCOUNTER — Ambulatory Visit: Payer: Medicare Other | Admitting: Internal Medicine

## 2012-07-06 DIAGNOSIS — E039 Hypothyroidism, unspecified: Secondary | ICD-10-CM

## 2012-07-07 ENCOUNTER — Other Ambulatory Visit: Payer: Medicare Other

## 2012-07-07 ENCOUNTER — Encounter: Payer: Self-pay | Admitting: Internal Medicine

## 2012-07-07 ENCOUNTER — Ambulatory Visit (INDEPENDENT_AMBULATORY_CARE_PROVIDER_SITE_OTHER): Payer: Medicare Other | Admitting: Internal Medicine

## 2012-07-07 VITALS — BP 128/80 | HR 65 | Temp 98.0°F | Wt 112.6 lb

## 2012-07-07 DIAGNOSIS — R030 Elevated blood-pressure reading, without diagnosis of hypertension: Secondary | ICD-10-CM | POA: Diagnosis not present

## 2012-07-07 MED ORDER — LEVOTHYROXINE SODIUM 75 MCG PO TABS: ORAL_TABLET | ORAL | Status: DC

## 2012-07-07 NOTE — Progress Notes (Signed)
  Subjective:    Patient ID: Natalie Lam, female    DOB: 1931-07-27, 79 y.o.   MRN: [id number]  HPI    06/29/12 her blood pressure was noted to be elevated @ her Dermatologist office; she was having several skin lesions removed. She believes that some of these lesions were basal cell cancer but not advanced. She is unsure of the level of her blood pressure at that time No PMH HYPERTENSION  ;but she's had elevated blood pressure at time of stress.  Home blood pressure  not monitored  Patient is on no BP medications  Exercise program as walking daily  for 10-15 minutes  No specific dietary program but she restricts salt in her diet     Review of Systems  No chest pain, palpitations, dyspnea, claudication,edema or paroxysmal nocturnal dyspnea described  No significant lightheadedness, headache, epistaxis, or syncope           Objective:   Physical Exam Thin but  healthy and well-nourished & in no acute distress  No carotid bruits are present.No neck pain distention present at 10 - 15 degrees.   Heart rhythm and rate are normal with no  gallops.Grade 1/6 systolic murmur @ R base  Chest is clear with no increased work of breathing  There is no evidence of aortic aneurysm or renal artery bruits  Abdomen soft with no organomegaly or masses. No HJR  No clubbing, cyanosis or edema present.  Pedal pulses are intact   No ischemic skin changes are present .   Alert and oriented. Strength, tone, DTRs reflexes normal          Assessment & Plan:  #1 history of elevated blood pressure without diagnosis of hypertension in context of stressful clinical situation  #2 exogenous stress; she has a strong faith which sustains her. She  declines any medication for stress management.

## 2012-07-07 NOTE — Patient Instructions (Addendum)
Minimal Blood Pressure Goal= AVERAGE < 140/90;  Ideal is an AVERAGE < 135/85. This AVERAGE should be calculated from @ least 5-7 BP readings taken @ different times of day on different days of week. You should not respond to isolated BP readings , but rather the AVERAGE for that week .Please bring your  blood pressure cuff to office visits to verify that it is reliable.It  can also be checked against the blood pressure device at the pharmacy. Finger or wrist cuffs are not dependable; an arm cuff is. To increase  Potassium (K+) increase citrus fruits & bananas in diet and use the salt substitute No Salt, which contains  potassium , to season food @ the table.  Review and correct the record as indicated. Please share record with all medical staff seen.  Please  schedule non fasting Labs in 12 weeks: BMET, TSH. PLEASE BRING THESE INSTRUCTIONS TO FOLLOW UP  LAB APPOINTMENT.This will guarantee correct labs are drawn, eliminating need for repeat blood sampling ( needle sticks ! ). Diagnoses /Codes: 244.9,276.8

## 2012-07-07 NOTE — Addendum Note (Signed)
Addended byPecola Lawless on: 07/07/2012 12:38 PM   Modules accepted: Orders

## 2012-07-13 ENCOUNTER — Encounter: Payer: Self-pay | Admitting: Internal Medicine

## 2012-07-14 ENCOUNTER — Encounter: Payer: Self-pay | Admitting: Internal Medicine

## 2012-07-14 DIAGNOSIS — C4490 Unspecified malignant neoplasm of skin, unspecified: Secondary | ICD-10-CM | POA: Insufficient documentation

## 2012-07-15 DIAGNOSIS — C44319 Basal cell carcinoma of skin of other parts of face: Secondary | ICD-10-CM | POA: Diagnosis not present

## 2012-07-22 DIAGNOSIS — Z483 Aftercare following surgery for neoplasm: Secondary | ICD-10-CM | POA: Diagnosis not present

## 2012-07-22 DIAGNOSIS — Z4802 Encounter for removal of sutures: Secondary | ICD-10-CM | POA: Diagnosis not present

## 2012-07-22 DIAGNOSIS — C44319 Basal cell carcinoma of skin of other parts of face: Secondary | ICD-10-CM | POA: Diagnosis not present

## 2012-07-29 DIAGNOSIS — Z4802 Encounter for removal of sutures: Secondary | ICD-10-CM | POA: Diagnosis not present

## 2012-12-09 DIAGNOSIS — L570 Actinic keratosis: Secondary | ICD-10-CM | POA: Diagnosis not present

## 2012-12-09 DIAGNOSIS — Z85828 Personal history of other malignant neoplasm of skin: Secondary | ICD-10-CM | POA: Diagnosis not present

## 2012-12-30 ENCOUNTER — Other Ambulatory Visit: Payer: Self-pay

## 2013-03-30 ENCOUNTER — Other Ambulatory Visit: Payer: Self-pay | Admitting: *Deleted

## 2013-03-30 MED ORDER — LEVOTHYROXINE SODIUM 75 MCG PO TABS: ORAL_TABLET | ORAL | Status: DC

## 2013-03-30 NOTE — Telephone Encounter (Signed)
Levothyroxine refill sent to pharmacy. OV due 

## 2013-04-01 ENCOUNTER — Other Ambulatory Visit: Payer: Self-pay

## 2013-04-02 ENCOUNTER — Other Ambulatory Visit: Payer: Self-pay | Admitting: *Deleted

## 2013-04-02 DIAGNOSIS — E785 Hyperlipidemia, unspecified: Secondary | ICD-10-CM

## 2013-04-02 MED ORDER — PRAVASTATIN SODIUM 20 MG PO TABS: 20.0000 mg | ORAL_TABLET | Freq: Every day | ORAL | Status: DC

## 2013-04-02 NOTE — Telephone Encounter (Signed)
Pravastatin refill sent to pharmacy. OV due 

## 2013-06-14 ENCOUNTER — Telehealth: Payer: Self-pay | Admitting: Internal Medicine

## 2013-06-14 ENCOUNTER — Other Ambulatory Visit: Payer: Self-pay | Admitting: Internal Medicine

## 2013-06-14 DIAGNOSIS — R748 Abnormal levels of other serum enzymes: Secondary | ICD-10-CM

## 2013-06-14 DIAGNOSIS — K219 Gastro-esophageal reflux disease without esophagitis: Secondary | ICD-10-CM

## 2013-06-14 DIAGNOSIS — E559 Vitamin D deficiency, unspecified: Secondary | ICD-10-CM

## 2013-06-14 DIAGNOSIS — E039 Hypothyroidism, unspecified: Secondary | ICD-10-CM

## 2013-06-14 DIAGNOSIS — E785 Hyperlipidemia, unspecified: Secondary | ICD-10-CM

## 2013-06-14 DIAGNOSIS — M810 Age-related osteoporosis without current pathological fracture: Secondary | ICD-10-CM

## 2013-06-14 NOTE — Telephone Encounter (Signed)
Patient is currently scheduled to come in for a medication Follow-up on Monday, 1/26. She needs to know if Dr. Linna Darner wants her to have labs prior to coming in for her f/u. Please advise.

## 2013-06-14 NOTE — Telephone Encounter (Signed)
Orders entered

## 2013-06-14 NOTE — Telephone Encounter (Signed)
Called and left message for patient to please call our office and make an appointment for labs. JG//CMA

## 2013-06-14 NOTE — Telephone Encounter (Signed)
Please advise 

## 2013-06-15 ENCOUNTER — Other Ambulatory Visit (INDEPENDENT_AMBULATORY_CARE_PROVIDER_SITE_OTHER): Payer: Medicare Other

## 2013-06-15 DIAGNOSIS — E785 Hyperlipidemia, unspecified: Secondary | ICD-10-CM

## 2013-06-15 DIAGNOSIS — E559 Vitamin D deficiency, unspecified: Secondary | ICD-10-CM

## 2013-06-15 DIAGNOSIS — R748 Abnormal levels of other serum enzymes: Secondary | ICD-10-CM | POA: Diagnosis not present

## 2013-06-15 DIAGNOSIS — K219 Gastro-esophageal reflux disease without esophagitis: Secondary | ICD-10-CM

## 2013-06-15 DIAGNOSIS — E039 Hypothyroidism, unspecified: Secondary | ICD-10-CM | POA: Diagnosis not present

## 2013-06-15 LAB — BASIC METABOLIC PANEL
BUN: 16 mg/dL (ref 6–23)
CHLORIDE: 105 meq/L (ref 96–112)
CO2: 29 mEq/L (ref 19–32)
CREATININE: 0.8 mg/dL (ref 0.4–1.2)
Calcium: 8.9 mg/dL (ref 8.4–10.5)
GFR: 69.43 mL/min (ref >60.00)
Glucose, Bld: 82 mg/dL (ref 70–99)
Potassium: 3.5 mEq/L (ref 3.5–5.1)
SODIUM: 141 meq/L (ref 135–145)

## 2013-06-15 LAB — CBC WITH DIFFERENTIAL/PLATELET
Basophils Absolute: 0 10*3/uL (ref 0.0–0.1)
Basophils Relative: 0.5 % (ref 0.0–3.0)
Eosinophils Absolute: 0.1 10*3/uL (ref 0.0–0.7)
Eosinophils Relative: 1.4 % (ref 0.0–5.0)
HCT: 44.8 % (ref 36.0–46.0)
HEMOGLOBIN: 15.2 g/dL — AB (ref 12.0–15.0)
LYMPHS PCT: 24.8 % (ref 12.0–46.0)
Lymphs Abs: 1.2 10*3/uL (ref 0.7–4.0)
MCHC: 34 g/dL (ref 30.0–36.0)
MCV: 93.8 fl (ref 78.0–100.0)
MONOS PCT: 7.6 % (ref 3.0–12.0)
Monocytes Absolute: 0.4 10*3/uL (ref 0.1–1.0)
NEUTROS ABS: 3.3 10*3/uL (ref 1.4–7.7)
Neutrophils Relative %: 65.7 % (ref 43.0–77.0)
Platelets: 177 10*3/uL (ref 150.0–400.0)
RBC: 4.78 Mil/uL (ref 3.87–5.11)
RDW: 13.9 % (ref 11.5–14.6)
WBC: 5 10*3/uL (ref 4.5–10.5)

## 2013-06-15 LAB — HEPATIC FUNCTION PANEL
ALK PHOS: 35 U/L — AB (ref 39–117)
ALT: 17 U/L (ref 0–35)
AST: 26 U/L (ref 0–37)
Albumin: 4 g/dL (ref 3.5–5.2)
BILIRUBIN DIRECT: 0.2 mg/dL (ref 0.0–0.3)
TOTAL PROTEIN: 7.1 g/dL (ref 6.0–8.3)
Total Bilirubin: 1.3 mg/dL — ABNORMAL HIGH (ref 0.3–1.2)

## 2013-06-15 LAB — LIPID PANEL
Cholesterol: 230 mg/dL — ABNORMAL HIGH (ref 0–200)
HDL: 111.6 mg/dL (ref >39.00)
Total CHOL/HDL Ratio: 2
Triglycerides: 40 mg/dL (ref 0.0–149.0)
VLDL: 8 mg/dL (ref 0.0–40.0)

## 2013-06-15 LAB — LDL CHOLESTEROL, DIRECT: Direct LDL: 105.9 mg/dL

## 2013-06-15 LAB — TSH: TSH: 4.2 u[IU]/mL (ref 0.35–5.50)

## 2013-06-18 LAB — VITAMIN D 1,25 DIHYDROXY
VITAMIN D3 1, 25 (OH): 53 pg/mL
Vitamin D 1, 25 (OH)2 Total: 53 pg/mL (ref 18–72)

## 2013-06-21 ENCOUNTER — Encounter: Payer: Self-pay | Admitting: Internal Medicine

## 2013-06-21 ENCOUNTER — Ambulatory Visit (INDEPENDENT_AMBULATORY_CARE_PROVIDER_SITE_OTHER): Payer: Medicare Other | Admitting: Internal Medicine

## 2013-06-21 VITALS — BP 191/85 | HR 67 | Temp 98.3°F | Wt 116.2 lb

## 2013-06-21 DIAGNOSIS — E785 Hyperlipidemia, unspecified: Secondary | ICD-10-CM

## 2013-06-21 DIAGNOSIS — R030 Elevated blood-pressure reading, without diagnosis of hypertension: Secondary | ICD-10-CM

## 2013-06-21 DIAGNOSIS — E039 Hypothyroidism, unspecified: Secondary | ICD-10-CM

## 2013-06-21 DIAGNOSIS — Z0000 Encounter for general adult medical examination without abnormal findings: Secondary | ICD-10-CM | POA: Diagnosis not present

## 2013-06-21 MED ORDER — HYDROCHLOROTHIAZIDE 12.5 MG PO CAPS: 12.5000 mg | ORAL_CAPSULE | Freq: Every day | ORAL | Status: DC

## 2013-06-21 MED ORDER — LEVOTHYROXINE SODIUM 75 MCG PO TABS: ORAL_TABLET | ORAL | Status: DC

## 2013-06-21 MED ORDER — PRAVASTATIN SODIUM 20 MG PO TABS: 20.0000 mg | ORAL_TABLET | Freq: Every day | ORAL | Status: DC

## 2013-06-21 NOTE — Progress Notes (Signed)
Pre visit review using our clinic review tool, if applicable. No additional management support is needed unless otherwise documented below in the visit note. 

## 2013-06-21 NOTE — Patient Instructions (Signed)
Minimal Blood Pressure Goal= AVERAGE < 150/90;  Ideal is an AVERAGE < 135/85. This AVERAGE should be calculated from @ least 5-7 BP readings taken @ different times of day on different days of week. You should not respond to isolated BP readings , but rather the AVERAGE for that week .Please bring your  blood pressure cuff to office visits to verify that it is reliable.It  can also be checked against the blood pressure device at the pharmacy. Finger or wrist cuffs are not dependable; an arm cuff is.Fill the  prescription for the BP medication if BP NOT @ goal  Of < 150/90based on  7 to 14 day average.

## 2013-06-21 NOTE — Progress Notes (Signed)
Subjective:    Patient ID: Natalie Lam Reasons, female    DOB: 1932-02-04, 80 y.o.   MRN: [id number]  HPI Medicare Wellness Visit: Psychosocial and medical history were reviewed as required by Medicare (history related to abuse, antisocial behavior , firearm risk). Social history: Caffeine: 2 cups coffee day , Alcohol:no  , Tobacco LPF:XTKWI Exercise:see below Personal safety/fall risk: No falls. House is free of cords and rugs on floor. Lives alone. Limitations of activities of daily living: none Seatbelt/ smoke alarm use: yes Healthcare Power of Attorney/Living Will status: yes, has both Ophthalmologic exam status: 10 years ago. Plans to make appointment. Hearing evaluation status: no hearing problems. No formal testing done. Orientation: Oriented X 3 Memory and recall: recalls 3 words after spelling backword Depression/anxiety assessment: denies depression. Takes pleasure in doing things. Foreign travel history:none Immunization status for influenza/pneumonia/ shingles /tetanus: does not take flu shot. Has not had pneumovax or shingles vaccination. SOC reviewed Transfusion history: none Preventive health care maintenance status: Colonoscopy less than 10 years ago-no polyps.  [id number] mammogram/Pap as per protocol/standard care: had in  2014 Dental care: yearly, exam 04/2013 Chart reviewed and updated. Active issues reviewed and addressed as documented below.    Review of Systems  A heart healthy diet is followed; exercise encompasses 30 minutes 3-4  times per week as  walking without symptoms.  Family history is neg  for premature coronary disease. Advanced cholesterol testing reveals  LDL goal is less than 160 . To date no statin.  Low dose ASA not taken due to easy bruising Specifically denied are  chest pain, palpitations, dyspnea, or claudication.       Objective:   Physical Exam Gen.: Thin but healthy and well-nourished in appearance. Alert, appropriate and cooperative  throughout exam. Appears younger than stated age  Head: Normocephalic without obvious abnormalities; no alopecia  Eyes: No corneal or conjunctival inflammation noted. Pupils equal round reactive to light and accommodation. Extraocular motion intact. Fundal exam is benign without hemorrhages, exudate, papilledema.  Vision grossly normal with lenses Ears: External  ear exam reveals no significant lesions or deformities. Canals clear .TMs normal. Hearing is grossly normal bilaterally. Nose: External nasal exam reveals no deformity or inflammation. Nasal mucosa are pink and moist. No lesions or exudates noted.   Mouth: Oral mucosa and oropharynx reveal no lesions or exudates. Teeth in good repair. Neck: No deformities, masses, or tenderness noted. Range of motion decreased. Thyroid small. Lungs: Normal respiratory effort; chest expands symmetrically. Lungs are clear to auscultation without rales, wheezes, or increased work of breathing. Heart: Normal rate and rhythm. Normal S1 and S2. No gallop, click, or rub. Grade 1.5 /6 systolic murmur @ base with L neck radiation. Repeat 150/90 Abdomen: Bowel sounds normal; abdomen soft and nontender. No masses, organomegaly or hernias noted. Genitalia:  as per Gyn                                  Musculoskeletal/extremities:   Accentuated curvature of  thoracic spine. No clubbing, cyanosis, edema, or significant extremity  deformity noted. Range of motion normal .Tone & strength normal. Hand joints normal . Fingernail  health good. Able to lie down & sit up w/o help. Negative SLR bilaterally Vascular: Carotid, radial artery, dorsalis pedis and  posterior tibial pulses are full and equal. No bruits present.varicose veins bilaterally Neurologic: Alert and oriented x3. Deep tendon reflexes symmetrical and normal.  Gait with  short steps; decreased arm swing  .      Skin: Intact without suspicious lesions or rashes. Lymph: No cervical, axillary lymphadenopathy  present. Psych: Mood and affect are normal. Normally interactive                                                                                        Assessment & Plan:  #1 Medicare Wellness Exam; criteria met ; data entered #2 Problem List/Diagnoses reviewed Plan:  Assessments made/ Orders entered

## 2013-06-24 ENCOUNTER — Telehealth: Payer: Self-pay

## 2013-06-24 ENCOUNTER — Other Ambulatory Visit: Payer: Self-pay | Admitting: Internal Medicine

## 2013-06-24 DIAGNOSIS — I10 Essential (primary) hypertension: Secondary | ICD-10-CM

## 2013-06-24 MED ORDER — LOSARTAN POTASSIUM 50 MG PO TABS: ORAL_TABLET | ORAL | Status: DC

## 2013-06-24 NOTE — Telephone Encounter (Signed)
Patient presents to the office after speaking with [facility] the evening prior. BP has been elevated even after taking the HCTZ that she was prescribed at her Annual Wellness Exam on Monday. She reports reading at the pharmacy of 202/105. She states that she took 3 12.5mg  HCTZ over the course of yesterday.  Manual BP check today 190/120. Consult with Hopp who recommends that patient begin taking Losartan 50mg . Take one immediately (patient given new Rx). Post lunch take the HCTZ with OJ. Take the second Losartan 50mg  in the evening.  Friday 06/25/2013 take Losartan 50mg  in the morning. Follow up appt at 2:30 with Hopp. Enacted orders with patient who repeats directions. Also, demonstrated to the patient how to use her new BP cuff. Patient given CB number with ext in case she has questions and or concerns before her follow up appt.

## 2013-06-25 ENCOUNTER — Encounter: Payer: Self-pay | Admitting: Internal Medicine

## 2013-06-25 ENCOUNTER — Ambulatory Visit (INDEPENDENT_AMBULATORY_CARE_PROVIDER_SITE_OTHER): Payer: Medicare Other | Admitting: Internal Medicine

## 2013-06-25 VITALS — BP 173/82 | HR 64 | Temp 98.4°F | Wt 115.4 lb

## 2013-06-25 DIAGNOSIS — I10 Essential (primary) hypertension: Secondary | ICD-10-CM | POA: Insufficient documentation

## 2013-06-25 NOTE — Assessment & Plan Note (Signed)
  Goals discussed BMET & OV 4 weeks

## 2013-06-25 NOTE — Patient Instructions (Signed)
Minimal Blood Pressure Goal= AVERAGE < 150/90;  Ideal is an AVERAGE < 140/85. This AVERAGE should be calculated from @ least 5-7 BP readings taken @ different times of day on different days of week. You should not respond to isolated BP readings , but rather the AVERAGE for that week .Please bring your  blood pressure cuff to office visits to verify that it is reliable.It  can also be checked against the blood pressure device at the pharmacy.  Labs in 4 weeks @ [facility]. Take HCTZ once daily with OJ.

## 2013-06-25 NOTE — Progress Notes (Signed)
   Subjective:    Patient ID: Natalie Lam, female    DOB: [birth year], 80 y.o.   MRN: [id number]  HPI  Blood pressure up to 915 systolic; BP dropped with initiation of CCB. Compliant with anti hypertemsive medication. No lightheadedness or other adverse medication effect described.  She is stressed about her son's health issues     Review of Systems  Significant headaches, epistaxis, chest pain, palpitations, exertional dyspnea, claudication, paroxysmal nocturnal dyspnea, or edema absent.       Objective:   Physical Exam  Thin but  healthy and well-nourished & in no acute distress   No carotid bruits are present.No neck pain distention present sitting. Thyroid normal to palpation  Heart rhythm and rate are normal with no significant murmurs or gallops. Accentuated S1.BP 189/96 with her cuff  Chest is clear with no increased work of breathing  There is no evidence of aortic aneurysm or renal artery bruits  Abdomen soft with no organomegaly or masses.   No clubbing, cyanosis or edema present.  Pedal pulses are intact   No ischemic skin changes are present .   Alert and oriented. Strength, tone, DTRs reflexes normal          Assessment & Plan:  See Current Assessment & Plan in Problem List under specific Diagnosis Stress ; meds declined

## 2013-06-25 NOTE — Progress Notes (Signed)
Pre visit review using our clinic review tool, if applicable. No additional management support is needed unless otherwise documented below in the visit note. 

## 2013-12-02 ENCOUNTER — Other Ambulatory Visit: Payer: Self-pay

## 2013-12-02 MED ORDER — LOSARTAN POTASSIUM 50 MG PO TABS: ORAL_TABLET | ORAL | Status: DC

## 2013-12-27 ENCOUNTER — Encounter: Payer: Medicare Other | Admitting: Internal Medicine

## 2014-01-21 ENCOUNTER — Ambulatory Visit (INDEPENDENT_AMBULATORY_CARE_PROVIDER_SITE_OTHER): Payer: Medicare Other | Admitting: Internal Medicine

## 2014-01-21 ENCOUNTER — Other Ambulatory Visit (INDEPENDENT_AMBULATORY_CARE_PROVIDER_SITE_OTHER): Payer: Medicare Other

## 2014-01-21 ENCOUNTER — Encounter: Payer: Self-pay | Admitting: Internal Medicine

## 2014-01-21 VITALS — BP 142/88 | HR 71 | Resp 13 | Ht 60.5 in | Wt 117.0 lb

## 2014-01-21 DIAGNOSIS — E039 Hypothyroidism, unspecified: Secondary | ICD-10-CM

## 2014-01-21 DIAGNOSIS — I10 Essential (primary) hypertension: Secondary | ICD-10-CM

## 2014-01-21 DIAGNOSIS — M810 Age-related osteoporosis without current pathological fracture: Secondary | ICD-10-CM

## 2014-01-21 DIAGNOSIS — E785 Hyperlipidemia, unspecified: Secondary | ICD-10-CM

## 2014-01-21 DIAGNOSIS — K219 Gastro-esophageal reflux disease without esophagitis: Secondary | ICD-10-CM | POA: Diagnosis not present

## 2014-01-21 DIAGNOSIS — E559 Vitamin D deficiency, unspecified: Secondary | ICD-10-CM

## 2014-01-21 LAB — LIPID PANEL
Cholesterol: 227 mg/dL — ABNORMAL HIGH (ref 0–200)
HDL: 94 mg/dL
LDL Cholesterol: 125 mg/dL — ABNORMAL HIGH (ref 0–99)
NonHDL: 133
Total CHOL/HDL Ratio: 2
Triglycerides: 41 mg/dL (ref 0.0–149.0)
VLDL: 8.2 mg/dL (ref 0.0–40.0)

## 2014-01-21 LAB — HEPATIC FUNCTION PANEL
ALK PHOS: 31 U/L — AB (ref 39–117)
ALT: 22 U/L (ref 0–35)
AST: 30 U/L (ref 0–37)
Albumin: 3.8 g/dL (ref 3.5–5.2)
BILIRUBIN DIRECT: 0.3 mg/dL (ref 0.0–0.3)
TOTAL PROTEIN: 6.7 g/dL (ref 6.0–8.3)
Total Bilirubin: 1.9 mg/dL — ABNORMAL HIGH (ref 0.2–1.2)

## 2014-01-21 LAB — CBC WITH DIFFERENTIAL/PLATELET
BASOS PCT: 0.2 % (ref 0.0–3.0)
Basophils Absolute: 0 10*3/uL (ref 0.0–0.1)
EOS PCT: 0.5 % (ref 0.0–5.0)
Eosinophils Absolute: 0 10*3/uL (ref 0.0–0.7)
HCT: 45.9 % (ref 36.0–46.0)
Hemoglobin: 15.4 g/dL — ABNORMAL HIGH (ref 12.0–15.0)
LYMPHS PCT: 18.5 % (ref 12.0–46.0)
Lymphs Abs: 1 10*3/uL (ref 0.7–4.0)
MCHC: 33.6 g/dL (ref 30.0–36.0)
MCV: 95.1 fl (ref 78.0–100.0)
Monocytes Absolute: 0.3 10*3/uL (ref 0.1–1.0)
Monocytes Relative: 6.1 % (ref 3.0–12.0)
Neutro Abs: 4.1 10*3/uL (ref 1.4–7.7)
Neutrophils Relative %: 74.7 % (ref 43.0–77.0)
Platelets: 171 10*3/uL (ref 150.0–400.0)
RBC: 4.83 Mil/uL (ref 3.87–5.11)
RDW: 13.6 % (ref 11.5–15.5)
WBC: 5.5 10*3/uL (ref 4.0–10.5)

## 2014-01-21 LAB — TSH: TSH: 2.53 u[IU]/mL (ref 0.35–4.50)

## 2014-01-21 LAB — BASIC METABOLIC PANEL
BUN: 19 mg/dL (ref 6–23)
CALCIUM: 9.2 mg/dL (ref 8.4–10.5)
CO2: 30 mEq/L (ref 19–32)
Chloride: 104 mEq/L (ref 96–112)
Creatinine, Ser: 0.9 mg/dL (ref 0.4–1.2)
GFR: 64.02 mL/min (ref >60.00)
Glucose, Bld: 78 mg/dL (ref 70–99)
Potassium: 4.2 mEq/L (ref 3.5–5.1)
SODIUM: 140 meq/L (ref 135–145)

## 2014-01-21 LAB — VITAMIN D 25 HYDROXY (VIT D DEFICIENCY, FRACTURES): VITD: 30.71 ng/mL (ref 30.00–100.00)

## 2014-01-21 NOTE — Assessment & Plan Note (Addendum)
BMD recommended but declined

## 2014-01-21 NOTE — Assessment & Plan Note (Signed)
Blood pressure goals reviewed. BMET 

## 2014-01-21 NOTE — Patient Instructions (Signed)
Your next office appointment will be determined based upon review of your pending labs. Those instructions will be transmitted to you through My Chart Minimal Blood Pressure Goal= AVERAGE < 150/90;  Ideal is an AVERAGE < 135/85. This AVERAGE should be calculated from @ least 5-7 BP readings taken @ different times of day on different days of week. You should not respond to isolated BP readings , but rather the AVERAGE for that week .Please bring your  blood pressure cuff to office visits to verify that it is reliable.It  can also be checked against the blood pressure device at the pharmacy.

## 2014-01-21 NOTE — Assessment & Plan Note (Addendum)
Lipids, LFTs, TSH  

## 2014-01-21 NOTE — Assessment & Plan Note (Signed)
TSH 

## 2014-01-21 NOTE — Progress Notes (Signed)
   Subjective:    Patient ID: Natalie Lam Reasons, female    DOB: June 12, 1931, 80 y.o.   MRN: [id number]  HPI  She is here to assess active health issues & conditions. PMH, FH, & Social history verified & updated   She has been compliant with her blood pressure, statin, and thyroid medications. She denies any associated adverse effects.  Blood pressure average is 140/80.  She is on a heart healthy, low-salt diet. She walks intermittently throughout the day; she does not have a specific exercise program.  She denies any active cardiopulmonary or GI symptoms.    Review of Systems   Chest pain, palpitations, tachycardia, exertional dyspnea, paroxysmal nocturnal dyspnea, claudication or edema are absent.  Unexplained weight loss, abdominal pain, significant dyspepsia, dysphagia, melena, rectal bleeding, or persistently small caliber stools are denied.     Objective:   Physical Exam Positive or pertinent findings include: Thin but well nourished. She appears much younger than her stated age.  The nasal septum is deviated to left. There is decreased range of motion of cervical spine. There is accentuation of the upper thoracic curve. The aorta is palpable with associated faint bruit. No aneurysm is present clinically. There is lateral deviation of the great toes. She exhibits a slow shuffling gait.  Eyes: No conjunctival inflammation or scleral icterus is present. Oral exam: Dental hygiene is good. Lips and gums are healthy appearing.There is no oropharyngeal erythema or exudate noted.  Heart:  Normal rate and regular rhythm. S1 and S2 normal without gallop, murmur, click, rub or other extra sounds   Lungs:Chest clear to auscultation; no wheezes, rhonchi,rales ,or rubs present.No increased work of breathing.  Abdomen: bowel sounds normal, soft and non-tender without masses, organomegaly or hernias noted.  No guarding or rebound. Skin:Warm & dry.  Intact without suspicious lesions or rashes  ; no jaundice or tenting Lymphatic: No lymphadenopathy is noted about the head, neck, axilla Strength , tone & DTRs WNL.            Assessment & Plan:  See Current Assessment & Plan in Problem List under specific Diagnosis.  Bone density followup discussed but declined  Also physical therapy referral to assess gait and balance issues also declined.

## 2014-01-21 NOTE — Assessment & Plan Note (Signed)
CBC

## 2014-01-21 NOTE — Assessment & Plan Note (Signed)
Vit D level.

## 2014-01-21 NOTE — Progress Notes (Signed)
Pre visit review using our clinic review tool, if applicable. No additional management support is needed unless otherwise documented below in the visit note. 

## 2014-04-11 ENCOUNTER — Other Ambulatory Visit: Payer: Self-pay

## 2014-04-11 DIAGNOSIS — R030 Elevated blood-pressure reading, without diagnosis of hypertension: Secondary | ICD-10-CM

## 2014-04-11 MED ORDER — HYDROCHLOROTHIAZIDE 12.5 MG PO CAPS: 12.5000 mg | ORAL_CAPSULE | Freq: Every day | ORAL | Status: DC

## 2014-08-05 ENCOUNTER — Telehealth: Payer: Self-pay

## 2014-08-05 NOTE — Telephone Encounter (Signed)
Patient was called on 1.11.16 and declined at that time.  Postponement documented

## 2014-08-11 ENCOUNTER — Observation Stay (HOSPITAL_BASED_OUTPATIENT_CLINIC_OR_DEPARTMENT_OTHER)
Admission: EM | Admit: 2014-08-11 | Discharge: 2014-08-12 | Disposition: A | Discharge status: Home / Self Care | Payer: Medicare Other | Attending: Internal Medicine | Admitting: Internal Medicine

## 2014-08-11 ENCOUNTER — Encounter (HOSPITAL_BASED_OUTPATIENT_CLINIC_OR_DEPARTMENT_OTHER): Payer: Self-pay

## 2014-08-11 ENCOUNTER — Emergency Department (HOSPITAL_BASED_OUTPATIENT_CLINIC_OR_DEPARTMENT_OTHER): Payer: Medicare Other

## 2014-08-11 ENCOUNTER — Observation Stay (HOSPITAL_COMMUNITY): Payer: Medicare Other

## 2014-08-11 DIAGNOSIS — M810 Age-related osteoporosis without current pathological fracture: Secondary | ICD-10-CM | POA: Diagnosis not present

## 2014-08-11 DIAGNOSIS — I350 Nonrheumatic aortic (valve) stenosis: Secondary | ICD-10-CM | POA: Diagnosis not present

## 2014-08-11 DIAGNOSIS — Z807 Family history of other malignant neoplasms of lymphoid, hematopoietic and related tissues: Secondary | ICD-10-CM | POA: Insufficient documentation

## 2014-08-11 DIAGNOSIS — M4854XA Collapsed vertebra, not elsewhere classified, thoracic region, initial encounter for fracture: Secondary | ICD-10-CM | POA: Diagnosis not present

## 2014-08-11 DIAGNOSIS — S01511A Laceration without foreign body of lip, initial encounter: Secondary | ICD-10-CM

## 2014-08-11 DIAGNOSIS — I10 Essential (primary) hypertension: Secondary | ICD-10-CM | POA: Diagnosis present

## 2014-08-11 DIAGNOSIS — R011 Cardiac murmur, unspecified: Secondary | ICD-10-CM | POA: Diagnosis present

## 2014-08-11 DIAGNOSIS — K219 Gastro-esophageal reflux disease without esophagitis: Secondary | ICD-10-CM | POA: Diagnosis present

## 2014-08-11 DIAGNOSIS — Z9114 Patient's other noncompliance with medication regimen: Secondary | ICD-10-CM | POA: Diagnosis not present

## 2014-08-11 DIAGNOSIS — E785 Hyperlipidemia, unspecified: Secondary | ICD-10-CM | POA: Diagnosis not present

## 2014-08-11 DIAGNOSIS — E039 Hypothyroidism, unspecified: Secondary | ICD-10-CM | POA: Diagnosis not present

## 2014-08-11 DIAGNOSIS — R262 Difficulty in walking, not elsewhere classified: Secondary | ICD-10-CM | POA: Insufficient documentation

## 2014-08-11 DIAGNOSIS — Z90710 Acquired absence of both cervix and uterus: Secondary | ICD-10-CM | POA: Insufficient documentation

## 2014-08-11 DIAGNOSIS — Z801 Family history of malignant neoplasm of trachea, bronchus and lung: Secondary | ICD-10-CM | POA: Insufficient documentation

## 2014-08-11 DIAGNOSIS — J329 Chronic sinusitis, unspecified: Secondary | ICD-10-CM | POA: Diagnosis not present

## 2014-08-11 DIAGNOSIS — Z808 Family history of malignant neoplasm of other organs or systems: Secondary | ICD-10-CM | POA: Diagnosis not present

## 2014-08-11 DIAGNOSIS — R55 Syncope and collapse: Secondary | ICD-10-CM | POA: Diagnosis not present

## 2014-08-11 DIAGNOSIS — S0181XA Laceration without foreign body of other part of head, initial encounter: Secondary | ICD-10-CM | POA: Diagnosis not present

## 2014-08-11 DIAGNOSIS — S0990XA Unspecified injury of head, initial encounter: Secondary | ICD-10-CM | POA: Diagnosis not present

## 2014-08-11 DIAGNOSIS — S0081XA Abrasion of other part of head, initial encounter: Secondary | ICD-10-CM | POA: Diagnosis not present

## 2014-08-11 DIAGNOSIS — R030 Elevated blood-pressure reading, without diagnosis of hypertension: Secondary | ICD-10-CM | POA: Diagnosis not present

## 2014-08-11 LAB — COMPREHENSIVE METABOLIC PANEL
ALT: 20 U/L (ref 0–35)
ANION GAP: 8 (ref 5–15)
AST: 33 U/L (ref 0–37)
Albumin: 3.6 g/dL (ref 3.5–5.2)
Alkaline Phosphatase: 34 U/L — ABNORMAL LOW (ref 39–117)
BUN: 21 mg/dL (ref 6–23)
CO2: 26 mmol/L (ref 19–32)
CREATININE: 0.81 mg/dL (ref 0.50–1.10)
Calcium: 8.8 mg/dL (ref 8.4–10.5)
Chloride: 107 mmol/L (ref 96–112)
GFR calc Af Amer: 77 mL/min — ABNORMAL LOW (ref >90)
GFR calc non Af Amer: 67 mL/min — ABNORMAL LOW (ref >90)
GLUCOSE: 89 mg/dL (ref 70–99)
Potassium: 3.8 mmol/L (ref 3.5–5.1)
Sodium: 141 mmol/L (ref 135–145)
Total Bilirubin: 1.3 mg/dL — ABNORMAL HIGH (ref 0.3–1.2)
Total Protein: 6.4 g/dL (ref 6.0–8.3)

## 2014-08-11 LAB — URINALYSIS, ROUTINE W REFLEX MICROSCOPIC
Bilirubin Urine: NEGATIVE
Glucose, UA: NEGATIVE mg/dL
Hgb urine dipstick: NEGATIVE
KETONES UR: 15 mg/dL — AB
Leukocytes, UA: NEGATIVE
Nitrite: NEGATIVE
Protein, ur: NEGATIVE mg/dL
SPECIFIC GRAVITY, URINE: 1.018 (ref 1.005–1.030)
Urobilinogen, UA: 1 mg/dL (ref 0.0–1.0)
pH: 6 (ref 5.0–8.0)

## 2014-08-11 LAB — PHOSPHORUS: Phosphorus: 2.7 mg/dL (ref 2.3–4.6)

## 2014-08-11 LAB — CBC WITH DIFFERENTIAL/PLATELET
Basophils Absolute: 0 10*3/uL (ref 0.0–0.1)
Basophils Relative: 0 % (ref 0–1)
EOS ABS: 0 10*3/uL (ref 0.0–0.7)
EOS PCT: 1 % (ref 0–5)
HCT: 43.8 % (ref 36.0–46.0)
Hemoglobin: 14.4 g/dL (ref 12.0–15.0)
LYMPHS PCT: 12 % (ref 12–46)
Lymphs Abs: 0.7 10*3/uL (ref 0.7–4.0)
MCH: 32.3 pg (ref 26.0–34.0)
MCHC: 32.9 g/dL (ref 30.0–36.0)
MCV: 98.2 fL (ref 78.0–100.0)
Monocytes Absolute: 0.4 10*3/uL (ref 0.1–1.0)
Monocytes Relative: 7 % (ref 3–12)
NEUTROS ABS: 4.3 10*3/uL (ref 1.7–7.7)
Neutrophils Relative %: 80 % — ABNORMAL HIGH (ref 43–77)
Platelets: 145 10*3/uL — ABNORMAL LOW (ref 150–400)
RBC: 4.46 MIL/uL (ref 3.87–5.11)
RDW: 13 % (ref 11.5–15.5)
WBC: 5.5 10*3/uL (ref 4.0–10.5)

## 2014-08-11 LAB — TROPONIN I
Troponin I: <0.03 ng/mL (ref <0.031)
Troponin I: <0.03 ng/mL (ref <0.031)

## 2014-08-11 LAB — MAGNESIUM: MAGNESIUM: 2 mg/dL (ref 1.5–2.5)

## 2014-08-11 LAB — TSH: TSH: 2.818 u[IU]/mL (ref 0.350–4.500)

## 2014-08-11 MED ORDER — ADULT MULTIVITAMIN W/MINERALS CH
1.0000 | ORAL_TABLET | Freq: Every day | ORAL | Status: DC
  Administered 2014-08-12: 1 via ORAL
  Filled 2014-08-11: qty 1

## 2014-08-11 MED ORDER — LEVOTHYROXINE SODIUM 75 MCG PO TABS
75.0000 ug | ORAL_TABLET | Freq: Every day | ORAL | Status: DC
Start: 2014-08-12 — End: 2014-08-12
  Administered 2014-08-12: 75 ug via ORAL
  Filled 2014-08-11: qty 1

## 2014-08-11 MED ORDER — TETANUS-DIPHTH-ACELL PERTUSSIS 5-2.5-18.5 LF-MCG/0.5 IM SUSP
0.5000 mL | Freq: Once | INTRAMUSCULAR | Status: AC
  Administered 2014-08-11: 0.5 mL via INTRAMUSCULAR
  Filled 2014-08-11: qty 0.5

## 2014-08-11 MED ORDER — HYDROCODONE-ACETAMINOPHEN 5-325 MG PO TABS: 1.0000 | ORAL_TABLET | Freq: Four times a day (QID) | ORAL | Status: DC | PRN

## 2014-08-11 MED ORDER — SODIUM CHLORIDE 0.9 % IJ SOLN
3.0000 mL | Freq: Two times a day (BID) | INTRAMUSCULAR | Status: DC
  Administered 2014-08-11 – 2014-08-12 (×2): 3 mL via INTRAVENOUS

## 2014-08-11 MED ORDER — LIDOCAINE HCL (PF) 1 % IJ SOLN: 30.0000 mL | Freq: Once | INTRAMUSCULAR | Status: DC

## 2014-08-11 MED ORDER — ONDANSETRON HCL 4 MG/2ML IJ SOLN: 4.0000 mg | Freq: Four times a day (QID) | INTRAMUSCULAR | Status: DC | PRN

## 2014-08-11 MED ORDER — SODIUM CHLORIDE 0.9 % IV SOLN: INTRAVENOUS | Status: DC

## 2014-08-11 MED ORDER — LIDOCAINE HCL 2 % EX GEL
CUTANEOUS | Status: AC
  Filled 2014-08-11: qty 20

## 2014-08-11 MED ORDER — PRAVASTATIN SODIUM 20 MG PO TABS
20.0000 mg | ORAL_TABLET | Freq: Every day | ORAL | Status: DC
  Administered 2014-08-11 – 2014-08-12 (×2): 20 mg via ORAL
  Filled 2014-08-11 (×2): qty 1

## 2014-08-11 MED ORDER — HYDROCHLOROTHIAZIDE 12.5 MG PO CAPS
12.5000 mg | ORAL_CAPSULE | Freq: Every day | ORAL | Status: DC
  Administered 2014-08-11 – 2014-08-12 (×2): 12.5 mg via ORAL
  Filled 2014-08-11 (×2): qty 1

## 2014-08-11 MED ORDER — ACETAMINOPHEN 325 MG PO TABS
650.0000 mg | ORAL_TABLET | Freq: Four times a day (QID) | ORAL | Status: DC | PRN
  Administered 2014-08-12: 650 mg via ORAL
  Filled 2014-08-11: qty 2

## 2014-08-11 MED ORDER — LOSARTAN POTASSIUM 50 MG PO TABS
50.0000 mg | ORAL_TABLET | Freq: Every day | ORAL | Status: DC
  Administered 2014-08-12: 50 mg via ORAL
  Filled 2014-08-11: qty 1

## 2014-08-11 MED ORDER — HYDRALAZINE HCL 20 MG/ML IJ SOLN: 10.0000 mg | Freq: Three times a day (TID) | INTRAMUSCULAR | Status: DC | PRN

## 2014-08-11 MED ORDER — ONDANSETRON HCL 4 MG PO TABS: 4.0000 mg | ORAL_TABLET | Freq: Four times a day (QID) | ORAL | Status: DC | PRN

## 2014-08-11 MED ORDER — MULTIVITAMINS PO CAPS: 1.0000 | ORAL_CAPSULE | Freq: Every day | ORAL | Status: DC

## 2014-08-11 MED ORDER — LIDOCAINE HCL 2 % EX GEL
1.0000 "application " | Freq: Once | CUTANEOUS | Status: AC
  Administered 2014-08-11: 1 via TOPICAL

## 2014-08-11 MED ORDER — ACETAMINOPHEN 650 MG RE SUPP: 650.0000 mg | Freq: Four times a day (QID) | RECTAL | Status: DC | PRN

## 2014-08-11 MED ORDER — LIDOCAINE HCL 2 % IJ SOLN
INTRAMUSCULAR | Status: AC
  Administered 2014-08-11: 400 mg
  Filled 2014-08-11: qty 20

## 2014-08-11 NOTE — Progress Notes (Signed)
Pt arrived from [facility], slid to bed from stretcher. REsting comfortably in bed, A&Ox4. Denies pain/dizziness at present. On tele box 14, confirmed w/ CCMD. VSS. Dr Aileen Fass paged to make aware of pt's arrival. Pt and dtr oriented to callbell and environment.

## 2014-08-11 NOTE — ED Provider Notes (Signed)
CSN: [id number]     Arrival date & time 08/11/14  1227 History   First MD Initiated Contact with Patient 08/11/14 1256     Chief Complaint  Patient presents with  . Near Syncope     (Consider location/radiation/quality/duration/timing/severity/associated sxs/prior Treatment) HPI  81 year old female presents after falling in the parking lot at [facility]. She had backed her car into the proximal spine was prepared to get out to go into the store when a cine she stood up she felt immediately lightheaded and had to crouch down to the ground. She states she did not pass out, but she currently hit her face on either the tire or the asphalt. She sustained a small lip laceration to her lower lip as well as an abrasion to her right face. Denies any preceding palpitations, chest pain, shortness of breath, or headache. Feels normal now. She has HCTZ for hypertension at home but does not typically take it because her blood pressures usually normal. She has not felt ill prior to this and has not been having any vomiting or diarrhea. No urinary symptoms. No weakness or numbness. Patient is unsure of her last tetanus immunization. Denies any tooth pain or jaw pain. Did not hit her head. Is not on a blood thinners.  Past Medical History  Diagnosis Date  . Hyperlipidemia   . Hypothyroidism   . Gilbert syndrome   . Rheumatic fever   . Osteoporosis    Past Surgical History  Procedure Laterality Date  . Abdominal hysterectomy      no BSO; no abnormal PAPs  . Hemorrhoid surgery    . Colonoscopy      negative; [place]   Family History  Problem Relation Age of Onset  . Dementia Mother     Alzheimers  . Stroke Mother     ? 55  . Hyperlipidemia Brother   . Cancer Son     multiple myeloma  . Lung cancer Maternal Uncle     smoker  . Pancreatic cancer Son   . Heart attack Neg Hx   . Diabetes Neg Hx   . Hearing loss Neg Hx    History  Substance Use Topics  . Smoking status: Never Smoker   .  Smokeless tobacco: Not on file  . Alcohol Use: No   OB History    No data available     Review of Systems  Respiratory: Negative for shortness of breath.   Cardiovascular: Negative for chest pain.  Gastrointestinal: Negative for nausea, vomiting and abdominal pain.  Genitourinary: Negative for dysuria.  Skin: Positive for wound.  Neurological: Positive for syncope. Negative for weakness, numbness and headaches.  All other systems reviewed and are negative.     Allergies  Sulfonamide derivatives  Home Medications   Prior to Admission medications   Medication Sig Start Date End Date Taking? Authorizing Provider  Calcium Carbonate-Vitamin D (CALCIUM 600/VITAMIN D PO) Take by mouth daily.    Historical Provider, MD  Digestive Enzymes TABS Take by mouth. 1-2 BY MOUTH DAILY    Historical Provider, MD  hydrochlorothiazide (MICROZIDE) 12.5 MG capsule Take 1 capsule (12.5 mg total) by mouth daily. 04/11/14   Hendricks Limes, MD  levothyroxine (SYNTHROID, LEVOTHROID) 75 MCG tablet 1 by mouth daily EXCEPT 1 1/2 on Tue/Thurs AND Sat, recheck labs 10 weeks 06/21/13   Hendricks Limes, MD  losartan (COZAAR) 50 MG tablet 1 bid 12/02/13   Hendricks Limes, MD  Multiple Vitamin (MULTIVITAMIN) capsule Take 1 capsule  by mouth daily.      Historical Provider, MD  pravastatin (PRAVACHOL) 20 MG tablet Take 1 tablet (20 mg total) by mouth daily. 06/21/13   Hendricks Limes, MD   BP 179/78 mmHg  Pulse 64  Temp(Src) 98.5 F (36.9 C) (Oral)  Resp 20  Ht _0  (1.575 m)  Wt 120 lb (54.432 kg)  BMI 21.94 kg/m2  SpO2 98% Physical Exam  Constitutional: She is oriented to person, place, and time. She appears well-developed and well-nourished.  HENT:  Head: Normocephalic and atraumatic.  Right Ear: External ear normal.  Left Ear: External ear normal.  Nose: Nose normal.  Mouth/Throat:    Eyes: EOM are normal. Pupils are equal, round, and reactive to light. Right eye exhibits no discharge. Left eye  exhibits no discharge.  Neck: Neck supple.  Cardiovascular: Normal rate and regular rhythm.   Murmur heard. Pulmonary/Chest: Effort normal and breath sounds normal.  Abdominal: Soft. There is no tenderness.  Neurological: She is alert and oriented to person, place, and time.  Skin: Skin is warm and dry.  Nursing note and vitals reviewed.   ED Course  NERVE BLOCK Date/Time: 08/11/2014 3:33 PM Performed by: Sherwood Gambler Authorized by: Sherwood Gambler Consent: Verbal consent obtained. Risks and benefits: risks, benefits and alternatives were discussed Consent given by: patient Indications: wound distortion Body area: face/mouth Nerve: mental Laterality: right Patient sedated: no Preparation: Patient was prepped and draped in the usual sterile fashion. Local anesthetic: lidocaine 2% without epinephrine Outcome: pain improved Patient tolerance: Patient tolerated the procedure well with no immediate complications  LACERATION REPAIR Date/Time: 08/11/2014 3:34 PM Performed by: Sherwood Gambler Authorized by: Sherwood Gambler Consent: Verbal consent obtained. Risks and benefits: risks, benefits and alternatives were discussed Consent given by: patient Body area: mouth Location details: lower lip, interior Laceration length: 0.5 cm Foreign bodies: no foreign bodies Tendon involvement: none Nerve involvement: none Vascular damage: no Anesthesia: nerve block Patient sedated: no Preparation: Patient was prepped and draped in the usual sterile fashion. Debridement: none Degree of undermining: none Mucous membrane closure: 5-0 Chromic gut Number of sutures: 2 Approximation: close Approximation difficulty: simple Dressing: 4x4 sterile gauze Patient tolerance: Patient tolerated the procedure well with no immediate complications   (including critical care time) Labs Review Labs Reviewed  COMPREHENSIVE METABOLIC PANEL - Abnormal; Notable for the following:    Alkaline  Phosphatase 34 (*)    Total Bilirubin 1.3 (*)    GFR calc non Af Amer 67 (*)    GFR calc Af Amer 77 (*)    All other components within normal limits  CBC WITH DIFFERENTIAL/PLATELET - Abnormal; Notable for the following:    Platelets 145 (*)    Neutrophils Relative % 80 (*)    All other components within normal limits  URINALYSIS, ROUTINE W REFLEX MICROSCOPIC - Abnormal; Notable for the following:    Ketones, ur 15 (*)    All other components within normal limits  TROPONIN I    Imaging Review Dg Chest 2 View  08/11/2014   CLINICAL DATA:  Near syncope  EXAM: CHEST  2 VIEW  COMPARISON:  None.  FINDINGS: No cardiomegaly. There is mild aortic tortuosity which is likely from a moderate thoracolumbar scoliosis. No evidence of aortic enlargement. No visible aortic valve calcification. Mild blunting of the posterior costophrenic sulci but not definitive for effusion. There is no edema, consolidation, or pneumothorax. There are at least 2 mid thoracic compression fractures, with indeterminate numbering. Associated height loss is  mild.  IMPRESSION: 1. No active cardiopulmonary disease. 2. Scoliosis with 2 mid thoracic compression fractures.   Electronically Signed   By: Monte Fantasia M.D.   On: 08/11/2014 14:09   Ct Maxillofacial Wo Cm  08/11/2014   CLINICAL DATA:  Patient fell outside today, syncope, right-sided facial swelling and lip laceration  EXAM: CT MAXILLOFACIAL WITHOUT CONTRAST  TECHNIQUE: Multidetector CT imaging of the maxillofacial structures was performed. Multiplanar CT image reconstructions were also generated. A small metallic BB was placed on the right temple in order to reliably differentiate right from left.  COMPARISON:  None.  FINDINGS: Mild mucosal thickening of frontal and ethmoid sinuses. Moderate right maxillary and mild left maxillary sinus mucosal thickening. No air-fluid levels.  No facial bone fractures.  Orbits are intact.  Nasal bones intact.  IMPRESSION: No fracture.   Chronic sinusitis.   Electronically Signed   By: Skipper Cliche M.D.   On: 08/11/2014 14:11     EKG Interpretation   Date/Time:  Thursday August 11 2014 12:47:39 EDT Ventricular Rate:  62 PR Interval:  168 QRS Duration: 92 QT Interval:  444 QTC Calculation: 450 R Axis:   9 Text Interpretation:  Normal sinus rhythm Possible Anterior infarct , age  undetermined Abnormal ECG No old tracing to compare Confirmed by [place]   MD, [place] ([id number]) on 08/11/2014 12:57:04 PM      MDM   Final diagnoses:  Syncope, unspecified syncope type  Lip laceration, initial encounter    Patient with syncope as above. Minimal prodrome. Normal neuro exam, hit face but does not have head trauma. Lac repaired as above. Given apparent new murmur based on patient's report and chart review, she will need observation with telemetry and echo to further investigate cause of syncope. Does not appear in distress here, is hypertensive but no signs of heart failure. Dr. Olevia Bowens accepts to [facility] in admission and transfer.    Sherwood Gambler, MD 08/11/14 1539

## 2014-08-11 NOTE — ED Notes (Signed)
MD at bedside.performing suture

## 2014-08-11 NOTE — Progress Notes (Signed)
81 year old with past medical history of hypertension noncompliant with her HCTZ comes in for syncope, as per ED physician she was getting out of her car at the grocery store when she passed out and fell to the ground. She's never had an echo and according to the emergency room physician she has Holosystolic murmur in the aortic area, we're consulted for syncope evaluation. She is coming to a [facility] telemetry.

## 2014-08-11 NOTE — ED Notes (Signed)
[facility] informed of transport; ETA 1.5 hours.  [facility] called and will be here to transport pt; ETA 15 minutes.  Report called to Hulan Amato, RN [facility]

## 2014-08-11 NOTE — ED Notes (Signed)
Patient was in her car at [facility], getting out of her car and felt lightheaded and fell to the ground.  Unknown LOC.  Laceration to lower lip and abrasion to upper lip.

## 2014-08-11 NOTE — ED Notes (Signed)
Pt out of room for testing. 

## 2014-08-11 NOTE — H&P (Signed)
[facility] History and Physical  Natalie Lam XBJ:[id number] DOB: [birth year] DOA: 08/11/2014  Referring physician: Dr. Regenia Skeeter  PCP: Unice Cobble, MD   Chief Complaint: syncope  HPI: Natalie Lam is a 81 y.o. female with PMH significant for HTN, hypothyroidism, HLD, hx of rheumatic fever, gilbert's syndrome and GERD; who presented to [facility] due to syncope episode. Patient reports feeling in her normal state of health when she went to [employer] around mid-day on admission day, after getting out of her car  And started walking towards inside the store she found herself sitting on the floor next to front tire. No fully recollection of how she fell; but able to get up, open car door and seat inside car. Law office present in the area saw patient struggling and on examination noticing she was bleeding from her mouth and called 911. Patient was send to ED for further evaluation and treatment. After fixing her lips injury, admission for syncope work up was requested. ED work up was pretty unremarkable.  Of note, patient denies CP, palpitations, SOB, fever, not changes in eating or drinking habits; no dysuria, no HA's, no blurred vision, N/V, diarrhea, melena or focal weakness.    Review of Systems:  Negative except as mentioned on HPI  Past Medical History  Diagnosis Date  . Hyperlipidemia   . Hypothyroidism   . Gilbert syndrome   . Rheumatic fever   . Osteoporosis    Past Surgical History  Procedure Laterality Date  . Abdominal hysterectomy      no BSO; no abnormal PAPs  . Hemorrhoid surgery    . Colonoscopy      negative; [place]   Social History:  reports that she has never smoked. She does not have any smokeless tobacco history on file. She reports that she does not drink alcohol or use illicit drugs.  Allergies  Allergen Reactions  . Sulfonamide Derivatives     REACTION:generalized  swelling  . Sulfa Antibiotics Swelling    Family History  Problem Relation Age  of Onset  . Dementia Mother     Alzheimers  . Stroke Mother     ? 56  . Hyperlipidemia Brother   . Cancer Son     multiple myeloma  . Lung cancer Maternal Uncle     smoker  . Pancreatic cancer Son   . Heart attack Neg Hx   . Diabetes Neg Hx   . Hearing loss Neg Hx     Prior to Admission medications   Medication Sig Start Date End Date Taking? Authorizing Provider  Calcium Carbonate-Vitamin D (CALCIUM 600/VITAMIN D PO) Take by mouth daily.   Yes Historical Provider, MD  Digestive Enzymes TABS Take by mouth. 1-2 BY MOUTH DAILY   Yes Historical Provider, MD  hydrochlorothiazide (MICROZIDE) 12.5 MG capsule Take 1 capsule (12.5 mg total) by mouth daily. 04/11/14  Yes Hendricks Limes, MD  levothyroxine (SYNTHROID, LEVOTHROID) 75 MCG tablet 1 by mouth daily EXCEPT 1 1/2 on Tue/Thurs AND Sat, recheck labs 10 weeks 06/21/13  Yes Hendricks Limes, MD  losartan (COZAAR) 50 MG tablet 1 bid 12/02/13  Yes Hendricks Limes, MD  Multiple Vitamin (MULTIVITAMIN) capsule Take 1 capsule by mouth daily.     Yes Historical Provider, MD  pravastatin (PRAVACHOL) 20 MG tablet Take 1 tablet (20 mg total) by mouth daily. 06/21/13  Yes Hendricks Limes, MD   Physical Exam: Filed Vitals:   08/11/14 1400 08/11/14 1617 08/11/14 1639 08/11/14 1738  BP:  174/94 172/70 208/93 179/76  Pulse: 73 59 63 55  Temp:    98.6 F (37 C)  TempSrc:    Oral  Resp: _0 Height:      Weight:      SpO2: 96% 98% 97% 99%    Wt Readings from Last 3 Encounters:  08/11/14 54.432 kg (120 lb)  01/21/14 53.071 kg (117 lb)  06/25/13 52.345 kg (115 lb 6.4 oz)    General:  Appears calm and comfortable; reports no major complains other than mild pain in lower aspect of her face. Denies palpitations, CP and SOB> no fever. Eyes: PERRL, normal lids, irises & conjunctiva; no nystagmus, no icterus  ENT: grossly normal hearing, no erythema or exudates inside her mouth; positive lip injury (see below); no drainage from ears or  nostrils  Neck: no LAD, masses or thyromegaly Cardiovascular: RRR, positive SEM, no rubs or gallops; 1+ JVD appreciated on exam. Also with trace LE edema bilaterally. Telemetry: SR, no arrhythmias  Respiratory: CTA bilaterally, no w/r/r. Normal respiratory effort. Abdomen: soft, nt, nd; positive BS Skin: abrasio/bruise in her chin; also with stiches in her lower lip; no active bleeding or signs of infection appreciated. Otherwise no rash or induration seen on exam Musculoskeletal: grossly normal tone BUE/BLE; no joint swelling Psychiatric: grossly normal mood and affect, speech fluent and appropriate Neurologic: grossly non-focal. MS 5/5 bilaterally, no drift; midline uvula, no nystagmus and normal finger to nose bilaterally. Patient AAOX3          Labs on Admission:  Basic Metabolic Panel:  Recent Labs Lab 08/11/14 1300  NA 141  K 3.8  CL 107  CO2 26  GLUCOSE 89  BUN 21  CREATININE 0.81  CALCIUM 8.8   Liver Function Tests:  Recent Labs Lab 08/11/14 1300  AST 33  ALT 20  ALKPHOS 34*  BILITOT 1.3*  PROT 6.4  ALBUMIN 3.6   CBC:  Recent Labs Lab 08/11/14 1300  WBC 5.5  NEUTROABS 4.3  HGB 14.4  HCT 43.8  MCV 98.2  PLT 145*   Cardiac Enzymes:  Recent Labs Lab 08/11/14 1300  TROPONINI <0.03    Radiological Exams on Admission: Dg Chest 2 View  08/11/2014   CLINICAL DATA:  Near syncope  EXAM: CHEST  2 VIEW  COMPARISON:  None.  FINDINGS: No cardiomegaly. There is mild aortic tortuosity which is likely from a moderate thoracolumbar scoliosis. No evidence of aortic enlargement. No visible aortic valve calcification. Mild blunting of the posterior costophrenic sulci but not definitive for effusion. There is no edema, consolidation, or pneumothorax. There are at least 2 mid thoracic compression fractures, with indeterminate numbering. Associated height loss is mild.  IMPRESSION: 1. No active cardiopulmonary disease. 2. Scoliosis with 2 mid thoracic compression  fractures.   Electronically Signed   By: Monte Fantasia M.D.   On: 08/11/2014 14:09   Ct Maxillofacial Wo Cm  08/11/2014   CLINICAL DATA:  Patient fell outside today, syncope, right-sided facial swelling and lip laceration  EXAM: CT MAXILLOFACIAL WITHOUT CONTRAST  TECHNIQUE: Multidetector CT imaging of the maxillofacial structures was performed. Multiplanar CT image reconstructions were also generated. A small metallic BB was placed on the right temple in order to reliably differentiate right from left.  COMPARISON:  None.  FINDINGS: Mild mucosal thickening of frontal and ethmoid sinuses. Moderate right maxillary and mild left maxillary sinus mucosal thickening. No air-fluid levels.  No facial bone fractures.  Orbits are intact.  Nasal bones intact.  IMPRESSION: No fracture.  Chronic sinusitis.   Electronically Signed   By: Skipper Cliche M.D.   On: 08/11/2014 14:11    EKG:  No acute ischemic changes appreciated; sinus rhythm; poor R wave progression   Assessment/Plan 1-Syncope: with concerns for TIA vs secondary to SEM appreciated on exam vs due to orthostatic changes. -will admit to telemetry -will check 2-D echo, carotid dopplers, CT head w/o contrast and orthostatic vital signs -if CT neg start patient on baby aspirin on daily basis (this can be carried at discharge for secondary prevention) -will also check TSH, cycle troponin and B12 levels -PT/OT consulted -no signs of infection in her UA and/or CXR; no prodromic symptoms or palpitations reproted by patient  2-Hypothyroidism: continue synthroid and check TSH level -will adjust as needed base on results  3-HLD (hyperlipidemia): will check lipid panel. -continue statins  4-Esophageal reflux: continue PPI  5-HTN (hypertension): uncontrolled on admission; but patient with mild pain from fall and has not taken her meds today. -given concerns for TIA, will be slightly permissive with some degree of HTN -hydralazine PRN would be  ordered -resume home antihypertensive regimen -watch closely diuretic effects and orthostatic changes  6-Gilbert's syndrome: chronic and stable. -mild elevation on bilirubin and alk phos appreciated  7-Heart murmur, systolic: appreciated on exam; suggesting aortic stenosis by location. Patient was not aware of murmur -follow 2-D echo  8-skin abrasion and lips injury with fall: no fracture seen on maxillofacial CT -status post stiches while in ED -continue PRN pain meds -no active bleeding appreciated   Code Status: Full DVT Prophylaxis:SCD's Family Communication: daughter and daughter in-law at bedside Disposition Plan: observation, telemetry. LOS < 2 midnights  Time spent: 50 minutes  Barton Dubois [facility] Pager [phone]

## 2014-08-11 NOTE — ED Notes (Signed)
MD at bedside. 

## 2014-08-12 DIAGNOSIS — I10 Essential (primary) hypertension: Secondary | ICD-10-CM | POA: Diagnosis not present

## 2014-08-12 DIAGNOSIS — R55 Syncope and collapse: Secondary | ICD-10-CM | POA: Diagnosis not present

## 2014-08-12 DIAGNOSIS — R011 Cardiac murmur, unspecified: Secondary | ICD-10-CM | POA: Diagnosis not present

## 2014-08-12 LAB — BASIC METABOLIC PANEL
Anion gap: 5 (ref 5–15)
BUN: 16 mg/dL (ref 6–23)
CHLORIDE: 107 mmol/L (ref 96–112)
CO2: 26 mmol/L (ref 19–32)
Calcium: 8.6 mg/dL (ref 8.4–10.5)
Creatinine, Ser: 0.8 mg/dL (ref 0.50–1.10)
GFR calc non Af Amer: 68 mL/min — ABNORMAL LOW (ref >90)
GFR, EST AFRICAN AMERICAN: 79 mL/min — AB (ref >90)
GLUCOSE: 85 mg/dL (ref 70–99)
POTASSIUM: 3.5 mmol/L (ref 3.5–5.1)
SODIUM: 138 mmol/L (ref 135–145)

## 2014-08-12 LAB — CBC
HCT: 43 % (ref 36.0–46.0)
Hemoglobin: 14.2 g/dL (ref 12.0–15.0)
MCH: 31.9 pg (ref 26.0–34.0)
MCHC: 33 g/dL (ref 30.0–36.0)
MCV: 96.6 fL (ref 78.0–100.0)
Platelets: 138 10*3/uL — ABNORMAL LOW (ref 150–400)
RBC: 4.45 MIL/uL (ref 3.87–5.11)
RDW: 12.8 % (ref 11.5–15.5)
WBC: 6.6 10*3/uL (ref 4.0–10.5)

## 2014-08-12 LAB — LIPID PANEL
Cholesterol: 199 mg/dL (ref 0–200)
HDL: 87 mg/dL (ref >39)
LDL Cholesterol: 104 mg/dL — ABNORMAL HIGH (ref 0–99)
Total CHOL/HDL Ratio: 2.3 RATIO
Triglycerides: 38 mg/dL (ref <150)
VLDL: 8 mg/dL (ref 0–40)

## 2014-08-12 LAB — TROPONIN I
Troponin I: <0.03 ng/mL (ref <0.031)
Troponin I: <0.03 ng/mL (ref <0.031)

## 2014-08-12 LAB — VITAMIN B12: Vitamin B-12: 765 pg/mL (ref 211–911)

## 2014-08-12 NOTE — Evaluation (Signed)
Physical Therapy Evaluation-1x Patient Details Name: Natalie Lam MRN: [id number] DOB: 01-May-1932 Today's Date: 08/12/2014   History of Present Illness  81 yo female admitted with syncopal episode, fall, face laceration. Hx of HTN.   Clinical Impression  On eval, pt was overall Min guard assist for mobility-able to walk ~450 feet without an assistive device. LOB x1 during first few feet of ambulation requiring Min assist to correct. No other instances of LOB during session. Pt denied lightheadedness/dizziness-BP 121/66 sitting and 128/67 standing. Recommended to pt that she take her time when changing positions (supine>sit, sit>stand). Recommend OP PT follow up for postural impairments and higher level balance training. Daughter present-states she has already made arrangement for pt to being OP therapy.     Follow Up Recommendations Outpatient PT    Equipment Recommendations  None recommended by PT    Recommendations for Other Services OT consult     Precautions / Restrictions Precautions Precautions: Fall Restrictions Weight Bearing Restrictions: No      Mobility  Bed Mobility               General bed mobility comments: pt oob in recliner  Transfers Overall transfer level: Needs assistance Equipment used: Rolling walker (2 wheeled) Transfers: Sit to/from Stand Sit to Stand: Min guard         General transfer comment: Min guard for safety  Ambulation/Gait Ambulation/Gait assistance: Min guard;Min assist Ambulation Distance (Feet): 450 Feet Assistive device: None Gait Pattern/deviations: Decreased stride length;Decreased step length - left;Decreased step length - right;Trunk flexed     General Gait Details: LOB x1 during first few steps requiring Min assist to correct. No other instances of LOB noted during session.   Stairs            Wheelchair Mobility    Modified Rankin (Stroke Patients Only)       Balance Overall balance assessment:  History of Falls;Needs assistance Sitting-balance support: No upper extremity supported;Feet supported Sitting balance-Leahy Scale: Normal     Standing balance support: No upper extremity supported;During functional activity Standing balance-Leahy Scale: Good Standing balance comment: EO/EC static standing, narrow BOS static standing-no difficulty.              High level balance activites: Side stepping;Backward walking;Direction changes;Turns;Sudden stops;Head turns High Level Balance Comments: Had pt perform above tasks. Increased time to complet tasks noted-Min guard assist             Pertinent Vitals/Pain Pain Assessment: No/denies pain    Home Living Family/patient expects to be discharged to:: Private residence Living Arrangements: Alone Available Help at Discharge: Family Type of Home: House Home Access: Stairs to enter   Technical brewer of Steps: 1 Home Layout: One level Home Equipment: None      Prior Function Level of Independence: Independent               Hand Dominance        Extremity/Trunk Assessment   Upper Extremity Assessment: Defer to OT evaluation           Lower Extremity Assessment: Generalized weakness      Cervical / Trunk Assessment: Kyphotic;Other exceptions  Communication   Communication: No difficulties  Cognition Arousal/Alertness: Awake/alert Behavior During Therapy: WFL for tasks assessed/performed Overall Cognitive Status: Within Functional Limits for tasks assessed                      General Comments      Exercises  Assessment/Plan    PT Assessment All further PT needs can be met in the next venue of care  PT Diagnosis Difficulty walking   PT Problem List Decreased balance;Other (comment) (Impaired posture)  PT Treatment Interventions     PT Goals (Current goals can be found in the Care Plan section) Acute Rehab PT Goals Patient Stated Goal: home.  PT Goal Formulation: All  assessment and education complete, DC therapy    Frequency     Barriers to discharge        Co-evaluation               End of Session Equipment Utilized During Treatment: Gait belt Activity Tolerance: Patient tolerated treatment well Patient left: in chair;with call bell/phone within reach;with family/visitor present      Functional Assessment Tool Used: clinical judgement Functional Limitation: Mobility: Walking and moving around Mobility: Walking and Moving Around Current Status ([id number]): At least 1 percent but less than 20 percent impaired, limited or restricted Mobility: Walking and Moving Around Goal Status [phone]): At least 1 percent but less than 20 percent impaired, limited or restricted Mobility: Walking and Moving Around Discharge Status [phone]): At least 1 percent but less than 20 percent impaired, limited or restricted    Time: 0923-0946 PT Time Calculation (min) (ACUTE ONLY): 23 min   Charges:   PT Evaluation $Initial PT Evaluation Tier I: 1 Procedure PT Treatments $Gait Training: 8-22 mins   PT G Codes:   PT G-Codes **NOT FOR INPATIENT CLASS** Functional Assessment Tool Used: clinical judgement Functional Limitation: Mobility: Walking and moving around Mobility: Walking and Moving Around Current Status (A7011): At least 1 percent but less than 20 percent impaired, limited or restricted Mobility: Walking and Moving Around Goal Status [phone]): At least 1 percent but less than 20 percent impaired, limited or restricted Mobility: Walking and Moving Around Discharge Status [phone]): At least 1 percent but less than 20 percent impaired, limited or restricted    Weston Anna, MPT Pager: [phone]

## 2014-08-12 NOTE — Care Management Note (Addendum)
    Page 1 of 1   08/12/2014     3:33:00 PM CARE MANAGEMENT NOTE 08/12/2014  Patient:  MAIZE, BRITTINGHAM   Account Number:  [id number]  Date Initiated:  08/12/2014  Documentation initiated by:  Dessa Phi  Subjective/Objective Assessment:   81 y/o f admitted w/Syncope.     Action/Plan:   From home.   Anticipated DC Date:  08/12/2014   Anticipated DC Plan:  [place]  CM consult  Outpatient Services - Pt will follow up      Choice offered to / List presented to:             Status of service:  Completed, signed off Medicare Important Message given?   (If response is "NO", the following Medicare IM given date fields will be blank) Date Medicare IM given:   Medicare IM given by:   Date Additional Medicare IM given:   Additional Medicare IM given by:    Discharge Disposition:  HOME/SELF CARE  Per UR Regulation:  Reviewed for med. necessity/level of care/duration of stay  If discussed at [place] of Stay Meetings, dates discussed:    Comments:  08/12/14 Dessa Phi RN BSN NCM [phone] Patient was already going to otpt PT, no script needed.[place] orders placed but not [facility]bound-not candidate for HHC.MD notified & agrees.No further d/c needs. PT-otpt rehab.Will provide otpt resource locations.Md please provide patient w/manual script for otpt rehab w/dx.No further dc needs.

## 2014-08-12 NOTE — Evaluation (Signed)
Occupational Therapy Evaluation Patient Details Name: Natalie Lam MRN: [id number] DOB: Nov 07, 1931 Today's Date: 08/12/2014    History of Present Illness 81 yo female admitted with syncopal episode, fall, face laceration. Hx of HTN.    Clinical Impression   Pt overall at min guard assist level with ADL. Family will be with pt initially at d/c. No DME needs. All education for safety recommendations made and pt verbalizes understanding. Sign off for OT.    Follow Up Recommendations  No OT follow up;Supervision - Intermittent    Equipment Recommendations  None recommended by OT    Recommendations for Other Services       Precautions / Restrictions Precautions Precautions: Fall Restrictions Weight Bearing Restrictions: No      Mobility Bed Mobility               General bed mobility comments: at EOB  Transfers Overall transfer level: Needs assistance Equipment used: None Transfers: Sit to/from Stand Sit to Stand: Min guard         General transfer comment: Min guard for safety    Balance Overall balance assessment: History of Falls;Needs assistance Sitting-balance support: No upper extremity supported;Feet supported Sitting balance-Leahy Scale: Normal                               ADL Overall ADL's : Needs assistance/impaired Eating/Feeding: Independent;Sitting   Grooming: Wash/dry hands;Standing;Min guard   Upper Body Bathing: Set up;Sitting   Lower Body Bathing: Min guard;Sit to/from stand   Upper Body Dressing : Set up;Sitting   Lower Body Dressing: Min guard;Sit to/from stand   Toilet Transfer: Min guard;Ambulation;Comfort height toilet   Toileting- Clothing Manipulation and Hygiene: Min guard;Sit to/from stand   Tub/ Shower Transfer: Tub transfer;Min guard     General ADL Comments: Pt's daughter present for session and can stay with pt initially. Practiced stepping over tub edge and pt was min guard assist. Pt usually  soaks in bathtub but recommended pt initially stand for shower either in walk in shower or tub for safety rather than trying to get down into tub and stand back up. Pt verbalized understanding. She states she has a bath brush she can use to wash LEs in standing for more safety. Discussed pacing self and going slower at first as pt reports she is used to walking fast. Also discussed sitting down to thread on clothing and then stand to pull up for more safety also.      Vision     Perception     Praxis      Pertinent Vitals/Pain Pain Assessment: No/denies pain     Hand Dominance     Extremity/Trunk Assessment Upper Extremity Assessment Upper Extremity Assessment: Overall WFL for tasks assessed      Cervical / Trunk Assessment Cervical / Trunk Assessment: Kyphotic;Other exceptions Cervical / Trunk Exceptions: R lateral trunk flexion   Communication Communication Communication: No difficulties   Cognition Arousal/Alertness: Awake/alert Behavior During Therapy: WFL for tasks assessed/performed Overall Cognitive Status: Within Functional Limits for tasks assessed                     General Comments       Exercises       Shoulder Instructions      Home Living Family/patient expects to be discharged to:: Private residence Living Arrangements: Alone Available Help at Discharge: Family Type of Home: House Home Access: Stairs to  enter Entrance Stairs-Number of Steps: 1   Home Layout: One level     Bathroom Shower/Tub: Tub/shower unit;Walk-in shower   Bathroom Toilet: Handicapped height     Home Equipment: None          Prior Functioning/Environment Level of Independence: Independent             OT Diagnosis: Generalized weakness   OT Problem List:     OT Treatment/Interventions:      OT Goals(Current goals can be found in the care plan section) Acute Rehab OT Goals Patient Stated Goal: home.  OT Goal Formulation: With patient/family  OT  Frequency:     Barriers to D/C:            Co-evaluation              End of Session    Activity Tolerance: Patient tolerated treatment well Patient left: in chair;with call bell/phone within reach;with family/visitor present   Time: 4076-8088 OT Time Calculation (min): 22 min Charges:  OT Evaluation $Initial OT Evaluation Tier I: 1 Procedure G-Codes: OT G-codes **NOT FOR INPATIENT CLASS** Functional Assessment Tool Used: clinical judgement Functional Limitation: Self care Self Care Current Status (P1031): At least 1 percent but less than 20 percent impaired, limited or restricted Self Care Goal Status (R9458): At least 1 percent but less than 20 percent impaired, limited or restricted Self Care Discharge Status [phone]): At least 1 percent but less than 20 percent impaired, limited or restricted  Natalie Lam, Natalie Lam 08/12/2014, 12:40 PM

## 2014-08-12 NOTE — Progress Notes (Signed)
Echocardiogram 2D Echocardiogram has been performed.  Darlina Sicilian M 08/12/2014, 11:27 AM

## 2014-08-12 NOTE — Progress Notes (Signed)
UR completed 

## 2014-08-12 NOTE — Progress Notes (Signed)
[facility] PROGRESS NOTE   Assessment/Plan: Syncope and collapse/Heart murmur, systolic: - No events on telemetry, cardiac enzyme are negative. - Ct head no acute finding no asymmetrical weakness. No sign of infection. - TSH and B12 with in normal. - Echo pending. Orthostatic negative.  Gilbert's syndrome - follow up with PCP.    Essential HTN (hypertension) - BP is stable cont antihypertensive medications.   Esophageal reflux - cont PPI.  Hypothyroidism - cont synthroid.  HLD (hyperlipidemia): - statins.   Code Status: full Family Communication: daughter  Disposition Plan: observation   Consultants:  None  Procedures:  CT head ans maxillofacial  CXR  Antibiotics:  none   HPI/Subjective: No complains feels great  Objective: Filed Vitals:   08/11/14 1639 08/11/14 1738 08/11/14 2205 08/12/14 0425  BP: 208/93 179/76 160/70 127/64  Pulse: 63 55 85 59  Temp:  98.6 F (37 C) 98.3 F (36.8 C) 97.6 F (36.4 C)  TempSrc:  Oral Oral Oral  Resp: 18 18 18 14   Height:      Weight:      SpO2: 97% 99% 100% 99%    Intake/Output Summary (Last 24 hours) at 08/12/14 0759 Last data filed at 08/12/14 0129  Gross per 24 hour  Intake      0 ml  Output    150 ml  Net   -150 ml   Filed Weights   08/11/14 1229 08/11/14 1250  Weight: 52.164 kg (115 lb) 54.432 kg (120 lb)    Exam:  General: Alert, awake, oriented x3, in no acute distress.  HEENT: No bruits, no goiter.  Heart: Regular rate and rhythm, without murmurs, rubs, gallops.  Lungs: Good air movement, bilateral air movement.  Abdomen: Soft, nontender, nondistended, positive bowel sounds.  Neuro: Grossly intact, nonfocal.   Data Reviewed: Basic Metabolic Panel:  Recent Labs Lab 08/11/14 1300 08/11/14 2109 08/12/14 0308  NA 141  --  138  K 3.8  --  3.5  CL 107  --  107  CO2 26  --  26  GLUCOSE 89  --  85  BUN 21  --  16  CREATININE 0.81  --  0.80  CALCIUM 8.8  --  8.6  MG  --   2.0  --   PHOS  --  2.7  --    Liver Function Tests:  Recent Labs Lab 08/11/14 1300  AST 33  ALT 20  ALKPHOS 34*  BILITOT 1.3*  PROT 6.4  ALBUMIN 3.6   No results for input(s): LIPASE, AMYLASE in the last 168 hours. No results for input(s): AMMONIA in the last 168 hours. CBC:  Recent Labs Lab 08/11/14 1300 08/12/14 0308  WBC 5.5 6.6  NEUTROABS 4.3  --   HGB 14.4 14.2  HCT 43.8 43.0  MCV 98.2 96.6  PLT 145* 138*   Cardiac Enzymes:  Recent Labs Lab 08/11/14 1300 08/11/14 2109 08/12/14 0308  TROPONINI <0.03 <0.03 <0.03   BNP (last 3 results) No results for input(s): BNP in the last 8760 hours.  ProBNP (last 3 results) No results for input(s): PROBNP in the last 8760 hours.  CBG: No results for input(s): GLUCAP in the last 168 hours.  No results found for this or any previous visit (from the past 240 hour(s)).   Studies: Dg Chest 2 View  08/11/2014   CLINICAL DATA:  Near syncope  EXAM: CHEST  2 VIEW  COMPARISON:  None.  FINDINGS: No cardiomegaly. There is mild aortic tortuosity which  is likely from a moderate thoracolumbar scoliosis. No evidence of aortic enlargement. No visible aortic valve calcification. Mild blunting of the posterior costophrenic sulci but not definitive for effusion. There is no edema, consolidation, or pneumothorax. There are at least 2 mid thoracic compression fractures, with indeterminate numbering. Associated height loss is mild.  IMPRESSION: 1. No active cardiopulmonary disease. 2. Scoliosis with 2 mid thoracic compression fractures.   Electronically Signed   By: Monte Fantasia M.D.   On: 08/11/2014 14:09   Ct Head Wo Contrast  08/12/2014   CLINICAL DATA:  Acute onset of syncope. Hit forehead on ground. Facial abrasions. Initial encounter.  EXAM: CT HEAD WITHOUT CONTRAST  TECHNIQUE: Contiguous axial images were obtained from the base of the skull through the vertex without intravenous contrast.  COMPARISON:  None.  FINDINGS: There is no  evidence of acute infarction, mass lesion, or intra- or extra-axial hemorrhage on CT.  Prominence of the ventricles and sulci reflects mild to moderate cortical volume loss. Cerebellar atrophy is noted. Scattered periventricular and subcortical white matter change likely reflects small vessel ischemic microangiopathy.  The brainstem and fourth ventricle are within normal limits. The basal ganglia are unremarkable in appearance. The cerebral hemispheres demonstrate grossly normal gray-white differentiation. No mass effect or midline shift is seen.  There is no evidence of fracture; visualized osseous structures are unremarkable in appearance. The orbits are within normal limits. The paranasal sinuses and mastoid air cells are well-aerated. Mild soft tissue swelling is noted overlying the right maxilla.  IMPRESSION: 1. No acute intracranial pathology seen on CT. 2. Mild to moderate cortical volume loss and scattered small vessel ischemic microangiopathy. 3. Mild soft tissue swelling overlying the right maxilla.   Electronically Signed   By: Garald Balding M.D.   On: 08/12/2014 00:13   Ct Maxillofacial Wo Cm  08/11/2014   CLINICAL DATA:  Patient fell outside today, syncope, right-sided facial swelling and lip laceration  EXAM: CT MAXILLOFACIAL WITHOUT CONTRAST  TECHNIQUE: Multidetector CT imaging of the maxillofacial structures was performed. Multiplanar CT image reconstructions were also generated. A small metallic BB was placed on the right temple in order to reliably differentiate right from left.  COMPARISON:  None.  FINDINGS: Mild mucosal thickening of frontal and ethmoid sinuses. Moderate right maxillary and mild left maxillary sinus mucosal thickening. No air-fluid levels.  No facial bone fractures.  Orbits are intact.  Nasal bones intact.  IMPRESSION: No fracture.  Chronic sinusitis.   Electronically Signed   By: Skipper Cliche M.D.   On: 08/11/2014 14:11    Scheduled Meds: . hydrochlorothiazide  12.5  mg Oral Daily  . levothyroxine  75 mcg Oral QAC breakfast  . losartan  50 mg Oral Daily  . multivitamin with minerals  1 tablet Oral Daily  . pravastatin  20 mg Oral Daily  . sodium chloride  3 mL Intravenous Q12H   Continuous Infusions: . sodium chloride       Charlynne Cousins  [facility] Pager [phone]. If 7PM-7AM, please contact night-coverage at [email or web address], password [facility] 08/12/2014, 7:59 AM

## 2014-08-12 NOTE — Discharge Summary (Signed)
Physician Discharge Summary  Natalie Lam VVO:[id number] DOB: May 13, 1932 DOA: 08/11/2014  PCP: Unice Cobble, MD  Admit date: 08/11/2014 Discharge date: 08/12/2014  Time spent: 30 minutes  Recommendations for Outpatient Follow-up:  1. Follow up with cardiology in 1 week  Discharge Diagnoses:  Principal Problem:   Syncope Active Problems:   Hypothyroidism   HLD (hyperlipidemia)   Esophageal reflux   HTN (hypertension)   Gilbert's syndrome   Heart murmur, systolic   Discharge Condition: stable  Diet recommendation: heart healthy  Filed Weights   08/11/14 1229 08/11/14 1250  Weight: 52.164 kg (115 lb) 54.432 kg (120 lb)    History of present illness:  81 y.o. female with PMH significant for HTN, hypothyroidism, HLD, hx of rheumatic fever, gilbert's syndrome and GERD; who presented to [facility] due to syncope episode. Patient reports feeling in her normal state of health when she went to [employer] around mid-day on admission day, after getting out of her car And started walking towards inside the store she found herself sitting on the floor next to front tire. No fully recollection of how she fell; but able to get up, open car door and seat inside car. Law office present in the area saw patient struggling and on examination noticing she was bleeding from her mouth and called 911. Patient was send to ED for further evaluation and treatment. After fixing her lips injury, admission for syncope work up was requested. ED work up was pretty unremarkable.  Hospital Course:  Syncope and collapse/Heart murmur, systolic: - No events on telemetry, cardiac enzyme are negative. - Ct head no acute finding no asymmetrical weakness on physical exam, no lost of control of sphincter. No sign of infection on U/A or CXR - TSH and B12 with in normal. - Echo as below mild AS. Orthostatic negative. - follow up with cardiology as an outpatient.  Gilbert's syndrome - follow up with PCP.   Essential  HTN (hypertension) - BP is stable cont antihypertensive medications.  Esophageal reflux - cont PPI.   Procedures:  Echo 3.18.2016: There was mild focal basal hypertrophy of the septum. Systolic function was normal. The estimated ejection fraction was in the range of 55% to 60%. Aortic valve: There was mild stenosis. Valve area (Vmax): 1.53 cm^2.  Consultations:  none  Discharge Exam: Filed Vitals:   08/12/14 1403  BP: 138/62  Pulse: 58  Temp: 98.2 F (36.8 C)  Resp: 18    General: A&O x3 Cardiovascular: RRR Respiratory: good air movement CTA B/L  Discharge Instructions   Discharge Instructions    Diet - low sodium heart healthy    Complete by:  As directed      Increase activity slowly    Complete by:  As directed           Current Discharge Medication List    CONTINUE these medications which have NOT CHANGED   Details  Calcium Carbonate-Vitamin D (CALCIUM 600/VITAMIN D PO) Take by mouth daily.    Digestive Enzymes TABS Take by mouth. 1-2 BY MOUTH DAILY    hydrochlorothiazide (MICROZIDE) 12.5 MG capsule Take 1 capsule (12.5 mg total) by mouth daily. Qty: 30 capsule, Refills: 5   Associated Diagnoses: Elevated blood pressure reading without diagnosis of hypertension    levothyroxine (SYNTHROID, LEVOTHROID) 75 MCG tablet 1 by mouth daily EXCEPT 1 1/2 on Tue/Thurs AND Sat, recheck labs 10 weeks Qty: 96 tablet, Refills: 3   Associated Diagnoses: Unspecified hypothyroidism    losartan (COZAAR) 50 MG tablet  1 bid Qty: 60 tablet, Refills: 5    Multiple Vitamin (MULTIVITAMIN) capsule Take 1 capsule by mouth daily.      pravastatin (PRAVACHOL) 20 MG tablet Take 1 tablet (20 mg total) by mouth daily. Qty: 90 tablet, Refills: 3   Associated Diagnoses: Other and unspecified hyperlipidemia       Allergies  Allergen Reactions  . Sulfonamide Derivatives     REACTION:generalized  swelling  . Sulfa Antibiotics Swelling   Follow-up Information    Follow  up with Minus Breeding, MD In 1 week.   Specialty:  Cardiology   Why:  hospital follow up   Contact information:   [address] [place] [address] [phone]        The results of significant diagnostics from this hospitalization (including imaging, microbiology, ancillary and laboratory) are listed below for reference.    Significant Diagnostic Studies: Dg Chest 2 View  08/11/2014   CLINICAL DATA:  Near syncope  EXAM: CHEST  2 VIEW  COMPARISON:  None.  FINDINGS: No cardiomegaly. There is mild aortic tortuosity which is likely from a moderate thoracolumbar scoliosis. No evidence of aortic enlargement. No visible aortic valve calcification. Mild blunting of the posterior costophrenic sulci but not definitive for effusion. There is no edema, consolidation, or pneumothorax. There are at least 2 mid thoracic compression fractures, with indeterminate numbering. Associated height loss is mild.  IMPRESSION: 1. No active cardiopulmonary disease. 2. Scoliosis with 2 mid thoracic compression fractures.   Electronically Signed   By: Monte Fantasia M.D.   On: 08/11/2014 14:09   Ct Head Wo Contrast  08/12/2014   CLINICAL DATA:  Acute onset of syncope. Hit forehead on ground. Facial abrasions. Initial encounter.  EXAM: CT HEAD WITHOUT CONTRAST  TECHNIQUE: Contiguous axial images were obtained from the base of the skull through the vertex without intravenous contrast.  COMPARISON:  None.  FINDINGS: There is no evidence of acute infarction, mass lesion, or intra- or extra-axial hemorrhage on CT.  Prominence of the ventricles and sulci reflects mild to moderate cortical volume loss. Cerebellar atrophy is noted. Scattered periventricular and subcortical white matter change likely reflects small vessel ischemic microangiopathy.  The brainstem and fourth ventricle are within normal limits. The basal ganglia are unremarkable in appearance. The cerebral hemispheres demonstrate grossly normal  gray-white differentiation. No mass effect or midline shift is seen.  There is no evidence of fracture; visualized osseous structures are unremarkable in appearance. The orbits are within normal limits. The paranasal sinuses and mastoid air cells are well-aerated. Mild soft tissue swelling is noted overlying the right maxilla.  IMPRESSION: 1. No acute intracranial pathology seen on CT. 2. Mild to moderate cortical volume loss and scattered small vessel ischemic microangiopathy. 3. Mild soft tissue swelling overlying the right maxilla.   Electronically Signed   By: Garald Balding M.D.   On: 08/12/2014 00:13   Ct Maxillofacial Wo Cm  08/11/2014   CLINICAL DATA:  Patient fell outside today, syncope, right-sided facial swelling and lip laceration  EXAM: CT MAXILLOFACIAL WITHOUT CONTRAST  TECHNIQUE: Multidetector CT imaging of the maxillofacial structures was performed. Multiplanar CT image reconstructions were also generated. A small metallic BB was placed on the right temple in order to reliably differentiate right from left.  COMPARISON:  None.  FINDINGS: Mild mucosal thickening of frontal and ethmoid sinuses. Moderate right maxillary and mild left maxillary sinus mucosal thickening. No air-fluid levels.  No facial bone fractures.  Orbits are intact.  Nasal bones  intact.  IMPRESSION: No fracture.  Chronic sinusitis.   Electronically Signed   By: Skipper Cliche M.D.   On: 08/11/2014 14:11    Microbiology: No results found for this or any previous visit (from the past 240 hour(s)).   Labs: Basic Metabolic Panel:  Recent Labs Lab 08/11/14 1300 08/11/14 2109 08/12/14 0308  NA 141  --  138  K 3.8  --  3.5  CL 107  --  107  CO2 26  --  26  GLUCOSE 89  --  85  BUN 21  --  16  CREATININE 0.81  --  0.80  CALCIUM 8.8  --  8.6  MG  --  2.0  --   PHOS  --  2.7  --    Liver Function Tests:  Recent Labs Lab 08/11/14 1300  AST 33  ALT 20  ALKPHOS 34*  BILITOT 1.3*  PROT 6.4  ALBUMIN 3.6   No  results for input(s): LIPASE, AMYLASE in the last 168 hours. No results for input(s): AMMONIA in the last 168 hours. CBC:  Recent Labs Lab 08/11/14 1300 08/12/14 0308  WBC 5.5 6.6  NEUTROABS 4.3  --   HGB 14.4 14.2  HCT 43.8 43.0  MCV 98.2 96.6  PLT 145* 138*   Cardiac Enzymes:  Recent Labs Lab 08/11/14 1300 08/11/14 2109 08/12/14 0308 08/12/14 0900  TROPONINI <0.03 <0.03 <0.03 <0.03   BNP: BNP (last 3 results) No results for input(s): BNP in the last 8760 hours.  ProBNP (last 3 results) No results for input(s): PROBNP in the last 8760 hours.  CBG: No results for input(s): GLUCAP in the last 168 hours.     Signed:  Charlynne Cousins  [facility] 08/12/2014, 2:44 PM

## 2014-08-15 ENCOUNTER — Telehealth: Payer: Self-pay | Admitting: Cardiology

## 2014-08-15 DIAGNOSIS — R55 Syncope and collapse: Secondary | ICD-10-CM | POA: Diagnosis not present

## 2014-08-15 DIAGNOSIS — I10 Essential (primary) hypertension: Secondary | ICD-10-CM | POA: Diagnosis not present

## 2014-08-15 DIAGNOSIS — E039 Hypothyroidism, unspecified: Secondary | ICD-10-CM | POA: Diagnosis not present

## 2014-08-15 NOTE — Telephone Encounter (Signed)
Spoke with [name] regarding the post hospital visit with cardiology that was ordered by Dr. Percival Spanish for Mrs. Natalie Lam.  [name] states that the patient wants to see her PCP and get her advice before scheduling with cardiology.  I gave her my phone number and extension and told her if she changes her mind to please call.

## 2014-08-17 DIAGNOSIS — H2513 Age-related nuclear cataract, bilateral: Secondary | ICD-10-CM | POA: Diagnosis not present

## 2014-08-18 ENCOUNTER — Ambulatory Visit (INDEPENDENT_AMBULATORY_CARE_PROVIDER_SITE_OTHER): Payer: Medicare Other | Admitting: Internal Medicine

## 2014-08-18 ENCOUNTER — Encounter: Payer: Self-pay | Admitting: Internal Medicine

## 2014-08-18 VITALS — BP 130/90 | HR 56 | Temp 98.0°F | Ht 62.0 in | Wt 119.2 lb

## 2014-08-18 DIAGNOSIS — R55 Syncope and collapse: Secondary | ICD-10-CM

## 2014-08-18 DIAGNOSIS — R0989 Other specified symptoms and signs involving the circulatory and respiratory systems: Secondary | ICD-10-CM | POA: Insufficient documentation

## 2014-08-18 DIAGNOSIS — I350 Nonrheumatic aortic (valve) stenosis: Secondary | ICD-10-CM | POA: Insufficient documentation

## 2014-08-18 NOTE — Progress Notes (Signed)
   Subjective:    Patient ID: Natalie Lam Reasons, female    DOB: 10/09/1931, 81 y.o.   MRN: [id number]  HPI She is here in follow-up after hospitalization for syncope 3/17-3/18/16. She got out of her car to go into a restaurant when she felt light headed and sat down. She struck her chin and lip on the wheel well. There was no cardiac or neurologic prodrome prior to the event. She was taken by EMS to the emergency room. Extensive workup was negative including cardiac enzymes; CT of the brain; and extensive labs. The platelet count was minimally reduced @ 138,000. LDL was minimally increased at 104. HDL is protective at 87. GFR was slightly reduced at 68. Telemetry revealed no dysrhythmias. Echocardiogram revealed mild aortic stenosis. She was not orthostatic during the hospitalization.  Cataract surgery by Dr. Valetta Close is pending.  Review of Systems   Prior to the event she was only taken her HCTZ once a week. She's had no benign positional vertigo or orthostatic symptoms.  Denied were any change in heart rhythm or rate prior to the event. There was no associated chest pain or shortness of breath .  Also specifically denied prior to the episode were headache, limb weakness, tingling, or numbness. No seizure activity noted.     Objective:   Physical Exam Pertinent or positive findings include: She has faint ecchymosis over the right maxilla and right chin.  There are bilateral carotid bruits versus radiation of a grade 1 systolic murmur at the base.  The thoracic spine curvature is accentuated. The aorta is palpable; a slight bruit is present. There is no aortic aneurysm.   Gen.:  Thin but adequately nourished; in no acute distress Eyes: Extraocular motion intact; no lid lag , proptosis , or nystagmus Neck: full ROM; no masses ; thyroid normal  Heart: Normal rhythm and rate without gallop or extra heart sounds Lungs: Chest clear to auscultation without rales,rales, wheezes Neuro:Deep tendon  reflexes are equal and within normal limits; no tremor  Skin/Nails: Warm and dry without significant lesions or rashes; no onycholysis Lymphatic: no cervical or axillary LA Psych: Normally communicative and interactive; no abnormal mood or affect clinically.         Assessment & Plan:  #1 syncope  #2 aortic stenosis, mild  #3 carotid bruits versus radiation of the murmur  #4 hypertension  Plan: Carotid Dopplers will be performed. Cardiology follow-up the pursued to assess severity of aortic stenosis and to assess the need for event Holter monitor.

## 2014-08-18 NOTE — Patient Instructions (Addendum)
The carotid Doppler will be scheduled and you'll be notified of the time. Please schedule the Cardiology follow-up. The to specific questions would be what is the degree of aortic stenosis and is an Event Holter monitor indicated because of the syncope.  Perform isometric exercise of calves  ( while seated go up on toes to count of 5 & then onto heels for 5 count). Repeat  4- 5 times prior to standing if you've been seated or supine for any significant period of time as BP drops with such positions.

## 2014-08-18 NOTE — Progress Notes (Signed)
Pre visit review using our clinic review tool, if applicable. No additional management support is needed unless otherwise documented below in the visit note. 

## 2014-08-24 ENCOUNTER — Other Ambulatory Visit: Payer: Self-pay

## 2014-08-24 DIAGNOSIS — R030 Elevated blood-pressure reading, without diagnosis of hypertension: Secondary | ICD-10-CM

## 2014-08-24 MED ORDER — HYDROCHLOROTHIAZIDE 12.5 MG PO CAPS: 12.5000 mg | ORAL_CAPSULE | Freq: Every day | ORAL | Status: DC

## 2014-08-26 HISTORY — PX: MELANOMA EXCISION: SHX5266

## 2014-09-06 ENCOUNTER — Ambulatory Visit (HOSPITAL_COMMUNITY)
Admission: RE | Admit: 2014-09-06 | Discharge: 2014-09-06 | Disposition: A | Discharge status: Home / Self Care | Payer: Medicare Other | Source: Ambulatory Visit | Attending: Internal Medicine | Admitting: Internal Medicine

## 2014-09-06 DIAGNOSIS — I6523 Occlusion and stenosis of bilateral carotid arteries: Secondary | ICD-10-CM | POA: Diagnosis not present

## 2014-09-06 DIAGNOSIS — R55 Syncope and collapse: Secondary | ICD-10-CM | POA: Insufficient documentation

## 2014-09-06 DIAGNOSIS — R0989 Other specified symptoms and signs involving the circulatory and respiratory systems: Secondary | ICD-10-CM

## 2014-09-06 NOTE — Progress Notes (Signed)
Carotid duplex completed.  [place]

## 2014-09-07 DIAGNOSIS — C4362 Malignant melanoma of left upper limb, including shoulder: Secondary | ICD-10-CM | POA: Diagnosis not present

## 2014-09-07 DIAGNOSIS — L821 Other seborrheic keratosis: Secondary | ICD-10-CM | POA: Diagnosis not present

## 2014-09-07 DIAGNOSIS — Z85828 Personal history of other malignant neoplasm of skin: Secondary | ICD-10-CM | POA: Diagnosis not present

## 2014-09-07 DIAGNOSIS — D485 Neoplasm of uncertain behavior of skin: Secondary | ICD-10-CM | POA: Diagnosis not present

## 2014-09-07 DIAGNOSIS — L814 Other melanin hyperpigmentation: Secondary | ICD-10-CM | POA: Diagnosis not present

## 2014-09-15 ENCOUNTER — Other Ambulatory Visit: Payer: Self-pay | Admitting: Internal Medicine

## 2014-09-15 ENCOUNTER — Other Ambulatory Visit: Payer: Self-pay

## 2014-09-15 DIAGNOSIS — E038 Other specified hypothyroidism: Secondary | ICD-10-CM

## 2014-09-15 MED ORDER — LEVOTHYROXINE SODIUM 75 MCG PO TABS: ORAL_TABLET | ORAL | Status: DC

## 2014-09-20 DIAGNOSIS — M5136 Other intervertebral disc degeneration, lumbar region: Secondary | ICD-10-CM | POA: Diagnosis not present

## 2014-09-20 DIAGNOSIS — S22070A Wedge compression fracture of T9-T10 vertebra, initial encounter for closed fracture: Secondary | ICD-10-CM | POA: Diagnosis not present

## 2014-09-20 DIAGNOSIS — M9903 Segmental and somatic dysfunction of lumbar region: Secondary | ICD-10-CM | POA: Diagnosis not present

## 2014-09-20 DIAGNOSIS — M9902 Segmental and somatic dysfunction of thoracic region: Secondary | ICD-10-CM | POA: Diagnosis not present

## 2014-09-20 DIAGNOSIS — S22060A Wedge compression fracture of T7-T8 vertebra, initial encounter for closed fracture: Secondary | ICD-10-CM | POA: Diagnosis not present

## 2014-09-20 DIAGNOSIS — M4004 Postural kyphosis, thoracic region: Secondary | ICD-10-CM | POA: Diagnosis not present

## 2014-09-20 DIAGNOSIS — M4126 Other idiopathic scoliosis, lumbar region: Secondary | ICD-10-CM | POA: Diagnosis not present

## 2014-09-21 DIAGNOSIS — M5136 Other intervertebral disc degeneration, lumbar region: Secondary | ICD-10-CM | POA: Diagnosis not present

## 2014-09-21 DIAGNOSIS — M9902 Segmental and somatic dysfunction of thoracic region: Secondary | ICD-10-CM | POA: Diagnosis not present

## 2014-09-21 DIAGNOSIS — S22060A Wedge compression fracture of T7-T8 vertebra, initial encounter for closed fracture: Secondary | ICD-10-CM | POA: Diagnosis not present

## 2014-09-21 DIAGNOSIS — M9903 Segmental and somatic dysfunction of lumbar region: Secondary | ICD-10-CM | POA: Diagnosis not present

## 2014-09-21 DIAGNOSIS — M4126 Other idiopathic scoliosis, lumbar region: Secondary | ICD-10-CM | POA: Diagnosis not present

## 2014-09-21 DIAGNOSIS — S22070A Wedge compression fracture of T9-T10 vertebra, initial encounter for closed fracture: Secondary | ICD-10-CM | POA: Diagnosis not present

## 2014-09-21 DIAGNOSIS — M4004 Postural kyphosis, thoracic region: Secondary | ICD-10-CM | POA: Diagnosis not present

## 2014-09-22 DIAGNOSIS — M4004 Postural kyphosis, thoracic region: Secondary | ICD-10-CM | POA: Diagnosis not present

## 2014-09-22 DIAGNOSIS — S22060A Wedge compression fracture of T7-T8 vertebra, initial encounter for closed fracture: Secondary | ICD-10-CM | POA: Diagnosis not present

## 2014-09-22 DIAGNOSIS — M9903 Segmental and somatic dysfunction of lumbar region: Secondary | ICD-10-CM | POA: Diagnosis not present

## 2014-09-22 DIAGNOSIS — M5136 Other intervertebral disc degeneration, lumbar region: Secondary | ICD-10-CM | POA: Diagnosis not present

## 2014-09-22 DIAGNOSIS — M4126 Other idiopathic scoliosis, lumbar region: Secondary | ICD-10-CM | POA: Diagnosis not present

## 2014-09-22 DIAGNOSIS — S22070A Wedge compression fracture of T9-T10 vertebra, initial encounter for closed fracture: Secondary | ICD-10-CM | POA: Diagnosis not present

## 2014-09-22 DIAGNOSIS — M9902 Segmental and somatic dysfunction of thoracic region: Secondary | ICD-10-CM | POA: Diagnosis not present

## 2014-09-23 DIAGNOSIS — M5136 Other intervertebral disc degeneration, lumbar region: Secondary | ICD-10-CM | POA: Diagnosis not present

## 2014-09-23 DIAGNOSIS — S22070A Wedge compression fracture of T9-T10 vertebra, initial encounter for closed fracture: Secondary | ICD-10-CM | POA: Diagnosis not present

## 2014-09-23 DIAGNOSIS — M9903 Segmental and somatic dysfunction of lumbar region: Secondary | ICD-10-CM | POA: Diagnosis not present

## 2014-09-23 DIAGNOSIS — M9902 Segmental and somatic dysfunction of thoracic region: Secondary | ICD-10-CM | POA: Diagnosis not present

## 2014-09-23 DIAGNOSIS — M4004 Postural kyphosis, thoracic region: Secondary | ICD-10-CM | POA: Diagnosis not present

## 2014-09-23 DIAGNOSIS — M4126 Other idiopathic scoliosis, lumbar region: Secondary | ICD-10-CM | POA: Diagnosis not present

## 2014-09-23 DIAGNOSIS — S22060A Wedge compression fracture of T7-T8 vertebra, initial encounter for closed fracture: Secondary | ICD-10-CM | POA: Diagnosis not present

## 2014-09-26 DIAGNOSIS — S22060A Wedge compression fracture of T7-T8 vertebra, initial encounter for closed fracture: Secondary | ICD-10-CM | POA: Diagnosis not present

## 2014-09-26 DIAGNOSIS — M4126 Other idiopathic scoliosis, lumbar region: Secondary | ICD-10-CM | POA: Diagnosis not present

## 2014-09-26 DIAGNOSIS — M9903 Segmental and somatic dysfunction of lumbar region: Secondary | ICD-10-CM | POA: Diagnosis not present

## 2014-09-26 DIAGNOSIS — M5136 Other intervertebral disc degeneration, lumbar region: Secondary | ICD-10-CM | POA: Diagnosis not present

## 2014-09-26 DIAGNOSIS — M4004 Postural kyphosis, thoracic region: Secondary | ICD-10-CM | POA: Diagnosis not present

## 2014-09-26 DIAGNOSIS — S22070A Wedge compression fracture of T9-T10 vertebra, initial encounter for closed fracture: Secondary | ICD-10-CM | POA: Diagnosis not present

## 2014-09-26 DIAGNOSIS — M9902 Segmental and somatic dysfunction of thoracic region: Secondary | ICD-10-CM | POA: Diagnosis not present

## 2014-10-06 ENCOUNTER — Ambulatory Visit (INDEPENDENT_AMBULATORY_CARE_PROVIDER_SITE_OTHER): Payer: Medicare Other | Admitting: Cardiology

## 2014-10-06 ENCOUNTER — Encounter: Payer: Self-pay | Admitting: Cardiology

## 2014-10-06 VITALS — BP 172/82 | HR 64 | Ht 64.0 in | Wt 121.6 lb

## 2014-10-06 DIAGNOSIS — I350 Nonrheumatic aortic (valve) stenosis: Secondary | ICD-10-CM | POA: Diagnosis not present

## 2014-10-06 DIAGNOSIS — I10 Essential (primary) hypertension: Secondary | ICD-10-CM | POA: Diagnosis not present

## 2014-10-06 NOTE — Patient Instructions (Signed)
Continue your current therapy  You are clear for cataract surgery and dental work.  I will see you in one year.

## 2014-10-07 NOTE — Progress Notes (Signed)
Cardiology Office Note   Date:  10/07/2014   ID:  Natalie Lam, DOB 1931-09-05, MRN [id number]  PCP:  Unice Cobble, MD  Cardiologist:   Gracie Gupta Martinique, MD   Chief Complaint  Patient presents with  . Follow-up    need a release for catarac surgery,, no other concerns      History of Present Illness: Natalie Lam is a 81 y.o. female who presents for evaluation of aortic stenosis at the request of Dr. Linna Darner. She is Dr. Orene Desanctis Molpus's mother in law. She was admitted in March with a near syncopal episode. She states suddenly her legs gave way and she slumped down hitting her lip on the car. She doesn't think she passed out. She had not been taking her BP medication regularly and on admission her BP was very high. Her regular BP meds were resumed and BP normalized. Other evaluation in hospital showed no acute pathology. Echo showed mild AS with normal LV function. No arrhythmia seen. She is feeling very well now. No recurrent dizziness. No chest pain or SOB. She is independent and lives alone. She is scheduled for cataract surgery and dental work and wants to know that it is safe.     Past Medical History  Diagnosis Date  . Hyperlipidemia   . Hypothyroidism   . Gilbert syndrome   . Rheumatic fever   . Osteoporosis   . Aortic stenosis, mild   . HTN (hypertension)     Past Surgical History  Procedure Laterality Date  . Abdominal hysterectomy      no BSO; no abnormal PAPs  . Hemorrhoid surgery    . Colonoscopy      negative; [place]     Current Outpatient Prescriptions  Medication Sig Dispense Refill  . Calcium Carbonate-Vitamin D (CALCIUM 600/VITAMIN D PO) Take by mouth daily.    . Digestive Enzymes TABS Take by mouth. 1-2 BY MOUTH DAILY    . hydrochlorothiazide (MICROZIDE) 12.5 MG capsule Take 1 capsule (12.5 mg total) by mouth daily. 30 capsule 5  . levothyroxine (SYNTHROID, LEVOTHROID) 75 MCG tablet 1 by mouth daily EXCEPT 1 1/2 on Tue/Thurs AND Sat, recheck labs 10  weeks 96 tablet 1  . losartan (COZAAR) 50 MG tablet 1 bid 60 tablet 5  . Multiple Vitamin (MULTIVITAMIN) capsule Take 1 capsule by mouth daily.      . pravastatin (PRAVACHOL) 20 MG tablet TAKE 1 BY MOUTH DAILY 90 tablet 1   No current facility-administered medications for this visit.    Allergies:   Sulfonamide derivatives and Sulfa antibiotics    Social History:  The patient  reports that she has never smoked. She does not have any smokeless tobacco history on file. She reports that she does not drink alcohol or use illicit drugs.   Family History:  The patient's family history includes Cancer in her son; Dementia in her mother; Hyperlipidemia in her brother; Lung cancer in her maternal uncle; Pancreatic cancer in her son; Stroke in her mother. There is no history of Heart attack, Diabetes, or Hearing loss.    ROS:  Please see the history of present illness.   Otherwise, review of systems are positive for none.   All other systems are reviewed and negative.    PHYSICAL EXAM: VS:  BP 172/82 mmHg  Pulse 64  Ht 5\' 4"  (1.626 m)  Wt 121 lb 9.6 oz (55.157 kg)  BMI 20.86 kg/m2 , BMI Body mass index is 20.86 kg/(m^2). GEN: Well  nourished, well developed, in no acute distress HEENT: normal Neck: no JVD, carotid bruits, or masses. Soft radiated murmur to carotids.  Cardiac: RRR; 2/6 systolic murmur RUSB, no rubs, or gallops,no edema  Respiratory:  clear to auscultation bilaterally, normal work of breathing GI: soft, nontender, nondistended, + BS MS: no deformity or atrophy Skin: warm and dry, no rash Neuro:  Strength and sensation are intact Psych: euthymic mood, full affect   EKG:  EKG is not ordered today. The ekg ordered today demonstrates N/A   Recent Labs: 08/11/2014: ALT 20; Magnesium 2.0; TSH 2.818 08/12/2014: BUN 16; Creatinine 0.80; Hemoglobin 14.2; Platelets 138*; Potassium 3.5; Sodium 138    Lipid Panel    Component Value Date/Time   CHOL 199 08/12/2014 0308   TRIG  38 08/12/2014 0308   TRIG 39 04/30/2006 1135   HDL 87 08/12/2014 0308   CHOLHDL 2.3 08/12/2014 0308   CHOLHDL 2.9 CALC 04/30/2006 1135   VLDL 8 08/12/2014 0308   LDLCALC 104* 08/12/2014 0308   LDLDIRECT 105.9 06/15/2013 0839   LDLDIRECT 171.9 04/30/2006 1135      Wt Readings from Last 3 Encounters:  10/06/14 121 lb 9.6 oz (55.157 kg)  08/18/14 119 lb 4 oz (54.091 kg)  08/11/14 120 lb (54.432 kg)      Other studies Reviewed: Additional studies/ records that were reviewed today include: Hospital records from March.  Review of the above records demonstrates:  Echo: Study Conclusions  - Left ventricle: The cavity size was normal. Wall thickness was increased in a pattern of mild LVH. There was mild focal basal hypertrophy of the septum. Systolic function was normal. The estimated ejection fraction was in the range of 55% to 60%. - Aortic valve: There was mild stenosis. Valve area (Vmax): 1.53 cm^2. - Atrial septum: No defect or patent foramen ovale was identified.  Carotid dopplers are normal.   CLINICAL DATA: Acute onset of syncope. Hit forehead on ground. Facial abrasions. Initial encounter.  EXAM: CT HEAD WITHOUT CONTRAST  TECHNIQUE: Contiguous axial images were obtained from the base of the skull through the vertex without intravenous contrast.  COMPARISON: None.  FINDINGS: There is no evidence of acute infarction, mass lesion, or intra- or extra-axial hemorrhage on CT.  Prominence of the ventricles and sulci reflects mild to moderate cortical volume loss. Cerebellar atrophy is noted. Scattered periventricular and subcortical white matter change likely reflects small vessel ischemic microangiopathy.  The brainstem and fourth ventricle are within normal limits. The basal ganglia are unremarkable in appearance. The cerebral hemispheres demonstrate grossly normal gray-white differentiation. No mass effect or midline shift is seen.  There is  no evidence of fracture; visualized osseous structures are unremarkable in appearance. The orbits are within normal limits. The paranasal sinuses and mastoid air cells are well-aerated. Mild soft tissue swelling is noted overlying the right maxilla.  IMPRESSION: 1. No acute intracranial pathology seen on CT. 2. Mild to moderate cortical volume loss and scattered small vessel ischemic microangiopathy. 3. Mild soft tissue swelling overlying the right maxilla.   Electronically Signed  By: Garald Balding M.D.  On: 08/12/2014 00:13    ASSESSMENT AND PLAN:  1.  Mild aortic stenosis. She is asymptomatic and it is very unlikely that her aortic stenosis will ever cause her problems at her age. Reassurance given. Will follow up in one year.  2. Near syncope. Appears related to uncontrolled HTN. Encouraged her to comply with medication.   Current medicines are reviewed at length with the patient today.  The patient does not have concerns regarding medicines.  The following changes have been made:  no change  Labs/ tests ordered today include: none  No orders of the defined types were placed in this encounter.     Disposition:   FU with Dr. Martinique in 1 year  Signed, Jemila Camille Martinique, [place]  10/07/2014 2:24 PM    [facility] [address], [place], [place], [address] Phone [phone], Fax [phone]

## 2014-10-10 DIAGNOSIS — M4004 Postural kyphosis, thoracic region: Secondary | ICD-10-CM | POA: Diagnosis not present

## 2014-10-10 DIAGNOSIS — S22070A Wedge compression fracture of T9-T10 vertebra, initial encounter for closed fracture: Secondary | ICD-10-CM | POA: Diagnosis not present

## 2014-10-10 DIAGNOSIS — M9903 Segmental and somatic dysfunction of lumbar region: Secondary | ICD-10-CM | POA: Diagnosis not present

## 2014-10-10 DIAGNOSIS — M9902 Segmental and somatic dysfunction of thoracic region: Secondary | ICD-10-CM | POA: Diagnosis not present

## 2014-10-10 DIAGNOSIS — M4126 Other idiopathic scoliosis, lumbar region: Secondary | ICD-10-CM | POA: Diagnosis not present

## 2014-10-10 DIAGNOSIS — M5136 Other intervertebral disc degeneration, lumbar region: Secondary | ICD-10-CM | POA: Diagnosis not present

## 2014-10-10 DIAGNOSIS — S22060A Wedge compression fracture of T7-T8 vertebra, initial encounter for closed fracture: Secondary | ICD-10-CM | POA: Diagnosis not present

## 2014-10-12 DIAGNOSIS — M4004 Postural kyphosis, thoracic region: Secondary | ICD-10-CM | POA: Diagnosis not present

## 2014-10-12 DIAGNOSIS — S22070A Wedge compression fracture of T9-T10 vertebra, initial encounter for closed fracture: Secondary | ICD-10-CM | POA: Diagnosis not present

## 2014-10-12 DIAGNOSIS — M4126 Other idiopathic scoliosis, lumbar region: Secondary | ICD-10-CM | POA: Diagnosis not present

## 2014-10-12 DIAGNOSIS — S22060A Wedge compression fracture of T7-T8 vertebra, initial encounter for closed fracture: Secondary | ICD-10-CM | POA: Diagnosis not present

## 2014-10-12 DIAGNOSIS — M9903 Segmental and somatic dysfunction of lumbar region: Secondary | ICD-10-CM | POA: Diagnosis not present

## 2014-10-12 DIAGNOSIS — M5136 Other intervertebral disc degeneration, lumbar region: Secondary | ICD-10-CM | POA: Diagnosis not present

## 2014-10-12 DIAGNOSIS — M9902 Segmental and somatic dysfunction of thoracic region: Secondary | ICD-10-CM | POA: Diagnosis not present

## 2014-10-13 DIAGNOSIS — C4362 Malignant melanoma of left upper limb, including shoulder: Secondary | ICD-10-CM | POA: Diagnosis not present

## 2014-10-13 DIAGNOSIS — Z85828 Personal history of other malignant neoplasm of skin: Secondary | ICD-10-CM | POA: Diagnosis not present

## 2014-10-20 DIAGNOSIS — Z5189 Encounter for other specified aftercare: Secondary | ICD-10-CM | POA: Diagnosis not present

## 2014-10-20 DIAGNOSIS — C439 Malignant melanoma of skin, unspecified: Secondary | ICD-10-CM | POA: Diagnosis not present

## 2014-10-27 DIAGNOSIS — Z4802 Encounter for removal of sutures: Secondary | ICD-10-CM | POA: Diagnosis not present

## 2014-12-06 DIAGNOSIS — L089 Local infection of the skin and subcutaneous tissue, unspecified: Secondary | ICD-10-CM | POA: Diagnosis not present

## 2014-12-06 DIAGNOSIS — Z85820 Personal history of malignant melanoma of skin: Secondary | ICD-10-CM | POA: Diagnosis not present

## 2014-12-06 DIAGNOSIS — C4490 Unspecified malignant neoplasm of skin, unspecified: Secondary | ICD-10-CM | POA: Diagnosis not present

## 2014-12-06 DIAGNOSIS — I83029 Varicose veins of left lower extremity with ulcer of unspecified site: Secondary | ICD-10-CM | POA: Diagnosis not present

## 2014-12-06 DIAGNOSIS — Z85828 Personal history of other malignant neoplasm of skin: Secondary | ICD-10-CM | POA: Diagnosis not present

## 2014-12-06 DIAGNOSIS — I872 Venous insufficiency (chronic) (peripheral): Secondary | ICD-10-CM | POA: Diagnosis not present

## 2014-12-06 DIAGNOSIS — C439 Malignant melanoma of skin, unspecified: Secondary | ICD-10-CM | POA: Diagnosis not present

## 2014-12-09 ENCOUNTER — Encounter: Payer: Self-pay | Admitting: Internal Medicine

## 2014-12-09 ENCOUNTER — Other Ambulatory Visit (INDEPENDENT_AMBULATORY_CARE_PROVIDER_SITE_OTHER): Payer: Medicare Other

## 2014-12-09 ENCOUNTER — Ambulatory Visit (INDEPENDENT_AMBULATORY_CARE_PROVIDER_SITE_OTHER): Payer: Medicare Other | Admitting: Internal Medicine

## 2014-12-09 ENCOUNTER — Ambulatory Visit (INDEPENDENT_AMBULATORY_CARE_PROVIDER_SITE_OTHER)
Admission: RE | Admit: 2014-12-09 | Discharge: 2014-12-09 | Disposition: A | Discharge status: Home / Self Care | Payer: Medicare Other | Source: Ambulatory Visit | Attending: Internal Medicine | Admitting: Internal Medicine

## 2014-12-09 VITALS — BP 138/90 | HR 60 | Temp 98.4°F | Resp 16 | Wt 120.0 lb

## 2014-12-09 DIAGNOSIS — R0600 Dyspnea, unspecified: Secondary | ICD-10-CM

## 2014-12-09 DIAGNOSIS — Z85820 Personal history of malignant melanoma of skin: Secondary | ICD-10-CM

## 2014-12-09 DIAGNOSIS — R5383 Other fatigue: Secondary | ICD-10-CM | POA: Diagnosis not present

## 2014-12-09 DIAGNOSIS — M436 Torticollis: Secondary | ICD-10-CM | POA: Insufficient documentation

## 2014-12-09 DIAGNOSIS — R269 Unspecified abnormalities of gait and mobility: Secondary | ICD-10-CM

## 2014-12-09 DIAGNOSIS — R0602 Shortness of breath: Secondary | ICD-10-CM | POA: Diagnosis not present

## 2014-12-09 DIAGNOSIS — R609 Edema, unspecified: Secondary | ICD-10-CM

## 2014-12-09 LAB — BASIC METABOLIC PANEL
BUN: 29 mg/dL — ABNORMAL HIGH (ref 6–23)
CO2: 31 mEq/L (ref 19–32)
CREATININE: 0.96 mg/dL (ref 0.40–1.20)
Calcium: 9.5 mg/dL (ref 8.4–10.5)
Chloride: 104 mEq/L (ref 96–112)
GFR: 59.29 mL/min — AB (ref >60.00)
Glucose, Bld: 85 mg/dL (ref 70–99)
POTASSIUM: 3.7 meq/L (ref 3.5–5.1)
Sodium: 141 mEq/L (ref 135–145)

## 2014-12-09 LAB — CBC WITH DIFFERENTIAL/PLATELET
Basophils Absolute: 0 10*3/uL (ref 0.0–0.1)
Basophils Relative: 0.4 % (ref 0.0–3.0)
Eosinophils Absolute: 0 10*3/uL (ref 0.0–0.7)
Eosinophils Relative: 0.5 % (ref 0.0–5.0)
HEMATOCRIT: 44.6 % (ref 36.0–46.0)
HEMOGLOBIN: 14.8 g/dL (ref 12.0–15.0)
LYMPHS PCT: 20.4 % (ref 12.0–46.0)
Lymphs Abs: 1.2 10*3/uL (ref 0.7–4.0)
MCHC: 33.3 g/dL (ref 30.0–36.0)
MCV: 95.7 fl (ref 78.0–100.0)
MONOS PCT: 6.5 % (ref 3.0–12.0)
Monocytes Absolute: 0.4 10*3/uL (ref 0.1–1.0)
NEUTROS ABS: 4.1 10*3/uL (ref 1.4–7.7)
NEUTROS PCT: 72.2 % (ref 43.0–77.0)
Platelets: 160 10*3/uL (ref 150.0–400.0)
RBC: 4.66 Mil/uL (ref 3.87–5.11)
RDW: 14.4 % (ref 11.5–15.5)
WBC: 5.7 10*3/uL (ref 4.0–10.5)

## 2014-12-09 LAB — HEPATIC FUNCTION PANEL
ALBUMIN: 4.1 g/dL (ref 3.5–5.2)
ALT: 22 U/L (ref 0–35)
AST: 29 U/L (ref 0–37)
Alkaline Phosphatase: 35 U/L — ABNORMAL LOW (ref 39–117)
BILIRUBIN DIRECT: 0.3 mg/dL (ref 0.0–0.3)
TOTAL PROTEIN: 6.8 g/dL (ref 6.0–8.3)
Total Bilirubin: 1.3 mg/dL — ABNORMAL HIGH (ref 0.2–1.2)

## 2014-12-09 LAB — TSH: TSH: 5.44 u[IU]/mL — ABNORMAL HIGH (ref 0.35–4.50)

## 2014-12-09 NOTE — Progress Notes (Signed)
Pre visit review using our clinic review tool, if applicable. No additional management support is needed unless otherwise documented below in the visit note. 

## 2014-12-09 NOTE — Progress Notes (Signed)
   Subjective:    Patient ID: Natalie Lam, female    DOB: March 04, 1932, 81 y.o.   MRN: [id number]  HPI She is here with multiple concerns which are delineated in detail by  her daughter.  Since her syncopal episode in March of this year she has had progressive deterioration with malaise, fatigue, and "failure to thrive" according to her daughter. They're planning on moving her into [facility].  She's had a melanoma removed from the left forearm in May by Dr. Rhona Raider of [facility].   She injured her left shin and it is healing slowly.  She has developed dyspnea as well as intermittent significant pedal edema. This is in the context of aortic stenosis.  She restricts salt and is on a heart healthy diet. Her blood pressure has averaged less than 140/90 for the most part.     Review of Systems  Chest pain, palpitations, tachycardia, paroxysmal nocturnal dyspnea, or claudication are absent. No unexplained weight loss, abdominal pain, significant dyspepsia, dysphagia, melena, rectal bleeding, or persistently small caliber stools. Dysuria, pyuria, hematuria, frequency, nocturia or polyuria are denied. Change in hair, skin, nails denied. No bowel changes of constipation or diarrhea. No intolerance to heat or cold.     Objective:   Physical Exam  Pertinent or positive findings include: She appears frail. Her head & neck are tilted to the right. There is marked increased tension in the posterior cervical musculature. Facies are somewhat masklike. The left tympanic membrane is dull. She has a grade 1 systolic murmur at the right base. Abdomen slightly protuberant. The left shin is surgically dressed. She has 1/2+ ankle edema. She has varicosities and spiders of the lower extremities. There is also scattered faint erythematous hyperpigmentation skin changes  without evidence of lymphangitis or cellulitis. Her gait is short and choppy. She turns slowly in a narrow circumference.  General  appearance :adequately nourished; in no distress.  Eyes: No conjunctival inflammation or scleral icterus is present.  Oral exam:  Lips and gums are healthy appearing.There is no oropharyngeal erythema or exudate noted. Dental hygiene is good.  Heart:  Normal rate and regular rhythm. S1 and S2 normal without gallop, click, rub or other extra sounds    Lungs:Chest clear to auscultation; no wheezes, rhonchi,rales ,or rubs present.No increased work of breathing.   Abdomen: bowel sounds normal, soft and non-tender without masses, organomegaly or hernias noted.  No guarding or rebound.   Vascular : all pulses equal ; no bruits present.  Skin:Warm & dry.  Intact without suspicious lesions or rashes ; no tenting or jaundice   Lymphatic: No lymphadenopathy is noted about the head, neck, axilla  Neuro: Strength & DTRs normal.        Assessment & Plan:  #1 fatigue  #2 dyspnea  #3 edema  #4 recent melanoma  #5 torticollis clinically  #6 possible movement disorder  Plan: See orders and recommendations

## 2014-12-09 NOTE — Patient Instructions (Signed)
  Your next office appointment will be determined based upon review of your pending labs  and  xrays  Those written interpretation of the lab results and instructions will be transmitted to you by My Chart   Critical results will be called.   Followup as needed for any active or acute issue. Please report any significant change in your symptoms.  Please consider evaluation by the movement disorder specialist, Dr. Carles Collet

## 2014-12-10 ENCOUNTER — Other Ambulatory Visit: Payer: Self-pay | Admitting: Internal Medicine

## 2014-12-10 DIAGNOSIS — E034 Atrophy of thyroid (acquired): Secondary | ICD-10-CM

## 2014-12-13 ENCOUNTER — Other Ambulatory Visit: Payer: Self-pay | Admitting: Emergency Medicine

## 2014-12-13 ENCOUNTER — Telehealth: Payer: Self-pay | Admitting: Emergency Medicine

## 2014-12-13 ENCOUNTER — Encounter: Payer: Self-pay | Admitting: Neurology

## 2014-12-13 ENCOUNTER — Other Ambulatory Visit: Payer: Medicare Other

## 2014-12-13 ENCOUNTER — Ambulatory Visit (INDEPENDENT_AMBULATORY_CARE_PROVIDER_SITE_OTHER): Payer: Medicare Other | Admitting: Neurology

## 2014-12-13 VITALS — BP 110/80 | HR 72 | Ht 60.0 in | Wt 121.0 lb

## 2014-12-13 DIAGNOSIS — G629 Polyneuropathy, unspecified: Secondary | ICD-10-CM

## 2014-12-13 DIAGNOSIS — G609 Hereditary and idiopathic neuropathy, unspecified: Secondary | ICD-10-CM | POA: Diagnosis not present

## 2014-12-13 MED ORDER — LOSARTAN POTASSIUM 50 MG PO TABS: ORAL_TABLET | ORAL | Status: DC

## 2014-12-13 NOTE — Telephone Encounter (Signed)
Please call patients daughter at

## 2014-12-13 NOTE — Telephone Encounter (Signed)
See the note below: [phone]

## 2014-12-13 NOTE — Patient Instructions (Signed)
1. Your provider has requested that you have labwork completed today. Please go to [facility] on the second floor of this building before leaving the office today. You do not need to check in. If you are not called within 15 minutes please check with the front desk.  2. We recommend that you use a cane.

## 2014-12-13 NOTE — Progress Notes (Signed)
Natalie Lam was seen today in the movement disorders clinic for neurologic consultation at the request of Unice Cobble, MD.  The patient is accompanied by her daughter who supplements the history.  Prior records made available to me were reviewed.  The patient has a history of gait instability.  Pts sx's started in March, 2016.  Pt thinks that it started quickly.  She remembers one particular day in March that she went to [facility] and she backed into the space and she realized that she tried to walk into [employer] and just felt that she would fall if she didn't get back in the car.  She had a near syncopal episode which was later determined due to HTN and mild AS.  After that, she seemed to go slowly down hill.  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  She had PT in May which helped her back but didn't really seem to help the gait.  Pt is getting ready to move to [facility] assisted living.  Pt states that she just has a "fear of falling because so many people my age do."  Admits that depression contributes to sx's but states that doing better than she was.  Pt denies lateralizing weakness/paresthesias.  Denies speech change but states that she "tires easily."  Denies feet paresthesias.      Specific Symptoms:  Tremor: No. Voice: denies hypophonic speech Sleep: sleeps well  Vivid Dreams:  Yes.   (only if eats at night)  Acting out dreams:  No. Wet Pillows: No. Postural symptoms:  Yes.   (no cane/walker to ambulate)  Falls?  No. Bradykinesia symptoms: slow movements Loss of smell:  No. Loss of taste:  No. Urinary Incontinence:  No. Difficulty Swallowing:  No. Handwriting, micrographia: No. (perhaps a little smaller) Trouble with ADL's:  No.  Trouble buttoning clothing: No. Depression:  Yes.   (multiple deaths in family) Memory changes:  Yes.   (but complicated by  deaths, moving, closing family business; still drives in familiar territory and parks far away to avoid other cars; pays monthly bills without issue; cooks for self until moves to [facility]) Hallucinations:  No.  visual distortions: No. N/V:  No. Lightheaded:  No.   Neuroimaging has previously been performed.  It is  available for my review today.  A CT of the brain was performed on 08/12/2014 after near syncopal episode.  I reviewed this.  There is atrophy and rather significant cervical small vessel disease.  A carotid ultrasound was performed on 09/06/2014.  This demonstrated 1-49% stenosis bilaterally.   ALLERGIES:   Allergies  Allergen Reactions  . Sulfonamide Derivatives     REACTION:generalized  swelling  . Sulfa Antibiotics Swelling    CURRENT MEDICATIONS:  Outpatient Encounter Prescriptions as of 12/13/2014  Medication Sig  . Calcium Carbonate-Vitamin D (CALCIUM 600/VITAMIN D PO) Take by mouth daily.  . Digestive Enzymes TABS Take by mouth. 1-2 BY MOUTH DAILY  . hydrochlorothiazide (MICROZIDE) 12.5 MG capsule Take 1 capsule (12.5 mg total) by mouth daily.  Marland Kitchen levothyroxine (SYNTHROID, LEVOTHROID) 75 MCG tablet 1 by mouth daily EXCEPT 1 1/2 on Tue/Thurs AND Sat, recheck labs 10 weeks  . losartan (COZAAR) 50 MG tablet 1 bid  . Multiple Vitamin (MULTIVITAMIN) capsule Take 1 capsule by mouth daily.    . pravastatin (PRAVACHOL) 20 MG tablet TAKE 1 BY MOUTH DAILY   No  facility-administered encounter medications on file as of 12/13/2014.    PAST MEDICAL HISTORY:   Past Medical History  Diagnosis Date  . Hyperlipidemia   . Hypothyroidism   . Gilbert syndrome   . Rheumatic fever   . Osteoporosis   . Aortic stenosis, mild   . HTN (hypertension)     PAST SURGICAL HISTORY:   Past Surgical History  Procedure Laterality Date  . Abdominal hysterectomy      no BSO; no abnormal PAPs  . Hemorrhoid surgery    . Colonoscopy      negative; [place]    SOCIAL HISTORY:   History     Social History  . Marital Status: Widowed    Spouse Name: N/A  . Number of Children: N/A  . Years of Education: N/A   Occupational History  . Not on file.   Social History Main Topics  . Smoking status: Never Smoker   . Smokeless tobacco: Not on file  . Alcohol Use: No  . Drug Use: No  . Sexual Activity: Not on file   Other Topics Concern  . Not on file   Social History Narrative    FAMILY HISTORY:   Family Status  Relation Status Death Age  . Son      DVT    ROS:  Some LE edema.  A complete 10 system review of systems was obtained and was unremarkable apart from what is mentioned above.  PHYSICAL EXAMINATION:    VITALS:   Filed Vitals:   12/13/14 0838  BP: 110/80  Pulse: 72  Height: 5' (1.524 m)  Weight: 121 lb (54.885 kg)    GEN:  The patient appears stated age and is in NAD. HEENT:  Normocephalic, atraumatic.  The mucous membranes are moist. The superficial temporal arteries are without ropiness or tenderness. CV:  RRR with 3/6 SEM Lungs:  CTAB Neck/HEME:  There are no carotid bruits bilaterally.  Neurological examination:  Orientation: Had trouble with trail making.  Did well with orientation. Montreal Cognitive Assessment  12/13/2014  Visuospatial/ Executive (0/5) 3  Naming (0/3) 2  Attention: Read list of digits (0/2) 2  Attention: Read list of letters (0/1) 1  Attention: Serial 7 subtraction starting at 100 (0/3) 1  Language: Repeat phrase (0/2) 2  Language : Fluency (0/1) 1  Abstraction (0/2) 2  Delayed Recall (0/5) 0  Orientation (0/6) 6  Total 20  Adjusted Score (based on education) 21    Cranial nerves: There is good facial symmetry. Pupils are equal round and reactive to light bilaterally. Fundoscopic exam reveals clear margins bilaterally. Extraocular muscles are intact. The visual fields are full to confrontational testing. The speech is fluent and clear. Soft palate rises symmetrically and there is no tongue deviation. Hearing is  intact to conversational tone. Sensation: Sensation is intact to light and pinprick throughout (facial, trunk, extremities).  Pinprick is decreased in a stocking distribution.  Vibration is decreased distally.  Proprioception is mildly decreased at bilateral big toe.  There is no extension with double simultaneous stimulation.  There is no sensory dermatomal level identified.   Motor: Strength is 5/5 in the bilateral upper and lower extremities.   Shoulder shrug is equal and symmetric.  There is no pronator drift. Deep tendon reflexes: Deep tendon reflexes are 2/4 at the bilateral biceps, triceps, brachioradialis, patella and absent at the bilateral achilles. Plantar responses are downgoing bilaterally.  Movement examination: Tone: There is normal tone in the bilateral upper extremities.  The tone  in the lower extremities is normal.  Abnormal movements: None Coordination:  There is no decremation with RAM's, with any form of RAMS, including alternating supination and pronation of the forearm, hand opening and closing, finger taps, heel taps and toe taps. Gait and Station: The patient uses her hands to get out of the chair.  Once up, she steadies herself and then walks in quick short steps out of the room.  She walks very quickly.  She has mild camptocormia.  She walks very fast down the hall.    Labs:  Lab Results  Component Value Date   [id number] 092 08/11/2014   No results found for: HGBA1C    ASSESSMENT/PLAN:  1.  Gait instability, likely due to peripheral neuropathy.  -Long discussion with the patient and her daughter today.  I saw no evidence of a neurodegenerative movement disorder such as Parkinson's disease.  I did see strong physical examination evidence of peripheral neuropathy.  I talked with the patient and her daughter about this today.  Greater than 50% of the 45 minute visit was spent counseling.  We discussed safety.  We discussed protection of the feet and safety in the  home.  We discussed using a cane as an ambulatory assistive device.  We discussed EMG, but decided to hold off on that.  -Most lab work has already been completed.  Her B12 was normal.  Her glucose was normal.  We will check SPEP/UPEP with immunofixation.  -I really have no objection to her going to [facility] assisted [facility].  I think this is appropriate level of care for her.  Told her I'll be happy to see her back in the future, if she needs me.  For now, we just left it as a prn visit.  Thank you, Dr. Linna Darner, for allowing me to participate in her care.  Please feel free to contact me should there be any questions or concerns.

## 2014-12-13 NOTE — Telephone Encounter (Signed)
-----   Message from Hendricks Limes, MD sent at 12/12/2014  7:47 AM EDT ----- Pleas tell her daughter Mrs. Molphus Dr tat will see her 7/19.

## 2014-12-13 NOTE — Telephone Encounter (Signed)
LVM for daughter to call back.  

## 2014-12-14 ENCOUNTER — Telehealth: Payer: Self-pay | Admitting: Emergency Medicine

## 2014-12-14 NOTE — Telephone Encounter (Signed)
He did not receive the fax. Can you please re fax it. I did verify the fax number

## 2014-12-14 NOTE — Telephone Encounter (Signed)
I thought daughter was to pick it up; if not , FAX (multiple pages)

## 2014-12-14 NOTE — Telephone Encounter (Signed)
Fax has been sent again to [facility], LVM for daughter to call back if they did not receive it. If not then she will need to come pick up the papers or have them mailed. The fax is 18 pages long and will not send correctly

## 2014-12-14 NOTE — Telephone Encounter (Signed)
Pts daughter called in. Notes were faxed to [place] to [place] at [phone]. She stated that pt saw Dr Tat yesterday and was dxed with bilateral peripheral neuropathy rather than dementia.

## 2014-12-15 LAB — SPEP & IFE WITH QIG
ALPHA-1-GLOBULIN: 0.3 g/dL (ref 0.2–0.3)
ALPHA-2-GLOBULIN: 0.6 g/dL (ref 0.5–0.9)
Albumin ELP: 4.1 g/dL (ref 3.8–4.8)
BETA GLOBULIN: 0.4 g/dL (ref 0.4–0.6)
Beta 2: 0.3 g/dL (ref 0.2–0.5)
GAMMA GLOBULIN: 0.9 g/dL (ref 0.8–1.7)
IGA: 158 mg/dL (ref 69–380)
IgG (Immunoglobin G), Serum: 1000 mg/dL (ref 690–1700)
IgM, Serum: 21 mg/dL — ABNORMAL LOW (ref 52–322)
Total Protein, Serum Electrophoresis: 6.5 g/dL (ref 6.1–8.1)

## 2014-12-16 LAB — UIFE/LIGHT CHAINS/TP QN, 24-HR UR
Albumin, U: DETECTED
Alpha 1, Urine: DETECTED — AB
Alpha 2, Urine: DETECTED — AB
BETA UR: DETECTED — AB
Gamma Globulin, Urine: DETECTED — AB
Total Protein, Urine: 19 mg/dL (ref 5–24)

## 2014-12-26 ENCOUNTER — Encounter: Payer: Self-pay | Admitting: Emergency Medicine

## 2014-12-26 ENCOUNTER — Telehealth: Payer: Self-pay | Admitting: Internal Medicine

## 2014-12-26 NOTE — Telephone Encounter (Signed)
OK for Assisted Living  Diet: Diet of choice, no added salt

## 2014-12-26 NOTE — Telephone Encounter (Signed)
Butch Penny from [facility] called and she Needs an order for assistant living and diet orders. She can be reached at [phone]

## 2014-12-26 NOTE — Telephone Encounter (Signed)
Please advise 

## 2014-12-26 NOTE — Telephone Encounter (Signed)
Sent letter with orders for assisted living and diet orders to Butch Penny with Social Work at [facility]. [phone]

## 2015-01-01 ENCOUNTER — Encounter: Payer: Self-pay | Admitting: Internal Medicine

## 2015-01-01 DIAGNOSIS — R030 Elevated blood-pressure reading, without diagnosis of hypertension: Secondary | ICD-10-CM

## 2015-01-01 DIAGNOSIS — E038 Other specified hypothyroidism: Secondary | ICD-10-CM

## 2015-01-02 ENCOUNTER — Emergency Department (HOSPITAL_BASED_OUTPATIENT_CLINIC_OR_DEPARTMENT_OTHER): Payer: Medicare Other

## 2015-01-02 ENCOUNTER — Emergency Department (HOSPITAL_BASED_OUTPATIENT_CLINIC_OR_DEPARTMENT_OTHER)
Admission: EM | Admit: 2015-01-02 | Discharge: 2015-01-02 | Disposition: A | Discharge status: Home / Self Care | Payer: Medicare Other | Attending: Emergency Medicine | Admitting: Emergency Medicine

## 2015-01-02 ENCOUNTER — Encounter (HOSPITAL_BASED_OUTPATIENT_CLINIC_OR_DEPARTMENT_OTHER): Payer: Self-pay

## 2015-01-02 DIAGNOSIS — Z8669 Personal history of other diseases of the nervous system and sense organs: Secondary | ICD-10-CM | POA: Diagnosis not present

## 2015-01-02 DIAGNOSIS — Z79899 Other long term (current) drug therapy: Secondary | ICD-10-CM | POA: Diagnosis not present

## 2015-01-02 DIAGNOSIS — Z85820 Personal history of malignant melanoma of skin: Secondary | ICD-10-CM | POA: Diagnosis not present

## 2015-01-02 DIAGNOSIS — Z8679 Personal history of other diseases of the circulatory system: Secondary | ICD-10-CM | POA: Diagnosis not present

## 2015-01-02 DIAGNOSIS — Y999 Unspecified external cause status: Secondary | ICD-10-CM | POA: Insufficient documentation

## 2015-01-02 DIAGNOSIS — E039 Hypothyroidism, unspecified: Secondary | ICD-10-CM | POA: Diagnosis not present

## 2015-01-02 DIAGNOSIS — M810 Age-related osteoporosis without current pathological fracture: Secondary | ICD-10-CM | POA: Insufficient documentation

## 2015-01-02 DIAGNOSIS — M79605 Pain in left leg: Secondary | ICD-10-CM | POA: Diagnosis present

## 2015-01-02 DIAGNOSIS — E785 Hyperlipidemia, unspecified: Secondary | ICD-10-CM | POA: Insufficient documentation

## 2015-01-02 DIAGNOSIS — L03116 Cellulitis of left lower limb: Secondary | ICD-10-CM | POA: Diagnosis not present

## 2015-01-02 DIAGNOSIS — W228XXA Striking against or struck by other objects, initial encounter: Secondary | ICD-10-CM | POA: Diagnosis not present

## 2015-01-02 DIAGNOSIS — Y929 Unspecified place or not applicable: Secondary | ICD-10-CM | POA: Insufficient documentation

## 2015-01-02 DIAGNOSIS — Z0389 Encounter for observation for other suspected diseases and conditions ruled out: Secondary | ICD-10-CM | POA: Diagnosis not present

## 2015-01-02 DIAGNOSIS — Y939 Activity, unspecified: Secondary | ICD-10-CM | POA: Diagnosis not present

## 2015-01-02 DIAGNOSIS — S81832A Puncture wound without foreign body, left lower leg, initial encounter: Secondary | ICD-10-CM | POA: Diagnosis not present

## 2015-01-02 HISTORY — DX: Polyneuropathy, unspecified: G62.9

## 2015-01-02 LAB — CBC WITH DIFFERENTIAL/PLATELET
BASOS PCT: 0 % (ref 0–1)
Basophils Absolute: 0 10*3/uL (ref 0.0–0.1)
EOS PCT: 1 % (ref 0–5)
Eosinophils Absolute: 0 10*3/uL (ref 0.0–0.7)
HCT: 41.5 % (ref 36.0–46.0)
HEMOGLOBIN: 13.5 g/dL (ref 12.0–15.0)
LYMPHS ABS: 1.1 10*3/uL (ref 0.7–4.0)
Lymphocytes Relative: 18 % (ref 12–46)
MCH: 32.1 pg (ref 26.0–34.0)
MCHC: 32.5 g/dL (ref 30.0–36.0)
MCV: 98.8 fL (ref 78.0–100.0)
MONOS PCT: 9 % (ref 3–12)
Monocytes Absolute: 0.6 10*3/uL (ref 0.1–1.0)
Neutro Abs: 4.4 10*3/uL (ref 1.7–7.7)
Neutrophils Relative %: 72 % (ref 43–77)
PLATELETS: 155 10*3/uL (ref 150–400)
RBC: 4.2 MIL/uL (ref 3.87–5.11)
RDW: 13.7 % (ref 11.5–15.5)
WBC: 6.1 10*3/uL (ref 4.0–10.5)

## 2015-01-02 LAB — BASIC METABOLIC PANEL
ANION GAP: 8 (ref 5–15)
BUN: 36 mg/dL — ABNORMAL HIGH (ref 6–20)
CO2: 29 mmol/L (ref 22–32)
Calcium: 9 mg/dL (ref 8.9–10.3)
Chloride: 106 mmol/L (ref 101–111)
Creatinine, Ser: 1.02 mg/dL — ABNORMAL HIGH (ref 0.44–1.00)
GFR calc non Af Amer: 50 mL/min — ABNORMAL LOW (ref >60)
GFR, EST AFRICAN AMERICAN: 58 mL/min — AB (ref >60)
Glucose, Bld: 85 mg/dL (ref 65–99)
Potassium: 3.8 mmol/L (ref 3.5–5.1)
SODIUM: 143 mmol/L (ref 135–145)

## 2015-01-02 MED ORDER — FLUCONAZOLE 150 MG PO TABS: 150.0000 mg | ORAL_TABLET | Freq: Every day | ORAL | Status: DC | PRN

## 2015-01-02 MED ORDER — CLINDAMYCIN HCL 300 MG PO CAPS: 300.0000 mg | ORAL_CAPSULE | Freq: Three times a day (TID) | ORAL | Status: DC

## 2015-01-02 MED ORDER — CLINDAMYCIN PHOSPHATE 600 MG/50ML IV SOLN
600.0000 mg | Freq: Once | INTRAVENOUS | Status: AC
  Administered 2015-01-02: 600 mg via INTRAVENOUS
  Filled 2015-01-02: qty 50

## 2015-01-02 NOTE — ED Notes (Signed)
Injured left LE approx 6 weeks ago-states area is not healing-completed keflex approx 1 week ago-has been dx with neuropathy

## 2015-01-02 NOTE — Discharge Instructions (Signed)
Take clinda three times daily for 10 days.   Follow up with your doctor.  Return to ER if you have fever, wound not healing, purulent discharge from wound.

## 2015-01-02 NOTE — ED Provider Notes (Signed)
CSN: [id number]     Arrival date & time 01/02/15  1417 History  This chart was scribed for Wandra Arthurs, MD by Meriel Pica, ED Scribe. This patient was seen in room MH02/MH02 and the patient's care was started 3:16 PM.   Chief Complaint  Patient presents with  . Leg Pain   The history is provided by the patient. No language interpreter was used.    HPI Comments: Natalie Lam is a 81 y.o. female, with a PMhx of HLD, hypothyroidism, osteoporosis, HTN and neuropathy, who presents to the Emergency Department complaining of a constant, moderate, non-healing wound to lower, lateral, left leg with associated pain s/p injury that occurred 8 weeks ago. Daughter-in-law reports pt sustained a puncture wound injury 8 weeks ago when she hit her leg on a plant. Pt finished a course of Keflex for 14 days approximately 1 week ago and wound is still not healing appropriately per daughter-in-law. Pt recently visited her PCP, Dr. Linna Darner, where she had unremarkable labs drawn. Tetanus UTD (1 year ago). Denies fevers or PMhx of DM.   Past Medical History  Diagnosis Date  . Hyperlipidemia   . Hypothyroidism   . Gilbert syndrome   . Rheumatic fever   . Osteoporosis   . Aortic stenosis, mild   . HTN (hypertension)   . Melanoma   . Neuropathy    Past Surgical History  Procedure Laterality Date  . Abdominal hysterectomy      no BSO; no abnormal PAPs  . Hemorrhoid surgery    . Colonoscopy      negative; [place]  . Melanoma excision  08/2014   Family History  Problem Relation Age of Onset  . Dementia Mother     Alzheimers  . Stroke Mother     ? 28  . Hyperlipidemia Brother   . Cancer Son     multiple myeloma  . Lung cancer Maternal Uncle     smoker  . Pancreatic cancer Son   . Heart attack Neg Hx   . Diabetes Neg Hx   . Hearing loss Neg Hx    History  Substance Use Topics  . Smoking status: Never Smoker   . Smokeless tobacco: Not on file  . Alcohol Use: No   OB History    No data  available     Review of Systems  Constitutional: Negative for fever.  Musculoskeletal: Positive for myalgias.  Skin: Positive for wound.  All other systems reviewed and are negative.  Allergies  Sulfonamide derivatives and Sulfa antibiotics  Home Medications   Prior to Admission medications   Medication Sig Start Date End Date Taking? Authorizing Provider  Calcium Carbonate-Vitamin D (CALCIUM 600/VITAMIN D PO) Take by mouth daily.    Historical Provider, MD  Digestive Enzymes TABS Take by mouth. 1-2 BY MOUTH DAILY    Historical Provider, MD  hydrochlorothiazide (MICROZIDE) 12.5 MG capsule Take 1 capsule (12.5 mg total) by mouth daily. 08/24/14   Hendricks Limes, MD  levothyroxine (SYNTHROID, LEVOTHROID) 75 MCG tablet 1 by mouth daily EXCEPT 1 1/2 on Tue/Thurs AND Sat, recheck labs 10 weeks 09/15/14   Hendricks Limes, MD  losartan (COZAAR) 50 MG tablet 1 bid 12/13/14   Hendricks Limes, MD  Multiple Vitamin (MULTIVITAMIN) capsule Take 1 capsule by mouth daily.      Historical Provider, MD  pravastatin (PRAVACHOL) 20 MG tablet TAKE 1 BY MOUTH DAILY 09/15/14   Hendricks Limes, MD   BP 144/76 mmHg  Pulse 66  Temp(Src) 98.3 F (36.8 C) (Oral)  Resp 18  Ht _0  (1.575 m)  Wt 120 lb (54.432 kg)  BMI 21.94 kg/m2  SpO2 96% Physical Exam  Constitutional: She appears well-developed and well-nourished. No distress.  HENT:  Head: Normocephalic.  Eyes: Conjunctivae are normal. Right eye exhibits no discharge. Left eye exhibits no discharge. No scleral icterus.  Neck: No JVD present.  Cardiovascular: Normal rate, regular rhythm and normal heart sounds.   Pulmonary/Chest: Effort normal and breath sounds normal. No respiratory distress. She has no wheezes.  Musculoskeletal:  Small puncture wound to left tibia with surrounding bruising; 2+ peripheral pulses; neurovascular intact. No subcutaneous air. No obvious foreign body   Neurological: She is alert. Coordination normal.  Skin: Skin is  warm. No rash noted. No erythema. No pallor.  Psychiatric: She has a normal mood and affect. Her behavior is normal.  Nursing note and vitals reviewed.   ED Course  Procedures  DIAGNOSTIC STUDIES: Oxygen Saturation is 96% on RA, adequate by my interpretation.    COORDINATION OF CARE: 3:22 PM Discussed treatment plan with pt and daughter. Will order diagnostic labs and Xray of left tibia. Will also order clindamycin IV. Pt and daughter acknowledge and agree to plan.   Labs Review Labs Reviewed  BASIC METABOLIC PANEL - Abnormal; Notable for the following:    BUN 36 (*)    Creatinine, Ser 1.02 (*)    GFR calc non Af Amer 50 (*)    GFR calc Af Amer 58 (*)    All other components within normal limits  CBC WITH DIFFERENTIAL/PLATELET    Imaging Review Dg Tibia/fibula Left  01/02/2015   CLINICAL DATA:  81 year old female with with history of penetrating trauma to the lateral aspect of the left lower leg 6 weeks ago.  EXAM: LEFT TIBIA AND FIBULA - 2 VIEW  COMPARISON:  No priors.  FINDINGS: Multiple views of the left tibia and fibula demonstrate no acute displaced fracture, subluxation, dislocation, or soft tissue abnormality. Specifically, no retained radiopaque foreign body in the soft tissues.  IMPRESSION: 1. Negative radiographs of the left tibia and fibula.   Electronically Signed   By: Vinnie Langton M.D.   On: 01/02/2015 15:55     EKG Interpretation None      MDM   Final diagnoses:  None   Natalie Lam is a 81 y.o. female here with L leg nonhealing ulcer. Has no hx of diabetes. Will check xray to r/o foreign body, check labs and give clinda.   4:36 PM WBC nl. Xray showed no foreign body. Will dc home with clinda. Gave strict return precautions. Patient will go to [facility] assisted living in several days.   I personally performed the services described in this documentation, which was scribed in my presence. The recorded information has been reviewed and is  accurate.    Wandra Arthurs, MD 01/02/15 [id number]

## 2015-01-04 NOTE — Telephone Encounter (Signed)
Rx per pcp pended. Sending message for verification and review of meds to send.   Per pcp fill 3, however pcp has 4 that is prescribed. i have pended the 4 for PCP review.

## 2015-01-04 NOTE — Telephone Encounter (Signed)
Please fill X 3 mos

## 2015-01-04 NOTE — Telephone Encounter (Signed)
Daughter states that she no longer needs this.  States she has turned her mothers care over to [facility].

## 2015-01-04 NOTE — Telephone Encounter (Signed)
LVM for daughter to call back to verify which [facility] the pt will be using, there are mult ones.

## 2015-01-06 ENCOUNTER — Telehealth: Payer: Self-pay | Admitting: Emergency Medicine

## 2015-01-06 NOTE — Telephone Encounter (Signed)
Orders are being sent to [facility]. Pt will be under the care of an in house provider starting 01/11/15.

## 2015-01-06 NOTE — Telephone Encounter (Signed)
Her daughter states she is now under care of staff MD @ that facility; that MD needs to address this

## 2015-01-06 NOTE — Telephone Encounter (Signed)
Spoke with a nurse from [facility], they are requesting written orders for the OTC medications/supplements that pt is taking.   Vit D3 200 units (OTC) Daily Probiotic(OTC) prn Digestive enzyme tabs, need to verify whether pt is taking 1 or 2 tablets per day.   Pt signed No Code agreement at [facility], they are needing a order for DNR. They will be faxing over the No Code agreement that the pt has signed.   Order for PT & OT to evaluate and treat. These orders are being faxed over as well.

## 2015-01-11 ENCOUNTER — Encounter: Payer: Self-pay | Admitting: Internal Medicine

## 2015-01-11 ENCOUNTER — Non-Acute Institutional Stay: Payer: Medicare Other | Admitting: Internal Medicine

## 2015-01-11 VITALS — BP 120/78 | HR 60 | Temp 97.6°F | Ht 60.5 in | Wt 119.0 lb

## 2015-01-11 DIAGNOSIS — R269 Unspecified abnormalities of gait and mobility: Secondary | ICD-10-CM

## 2015-01-11 DIAGNOSIS — M810 Age-related osteoporosis without current pathological fracture: Secondary | ICD-10-CM | POA: Diagnosis not present

## 2015-01-11 DIAGNOSIS — R2681 Unsteadiness on feet: Secondary | ICD-10-CM | POA: Diagnosis not present

## 2015-01-11 DIAGNOSIS — E041 Nontoxic single thyroid nodule: Secondary | ICD-10-CM | POA: Diagnosis not present

## 2015-01-11 DIAGNOSIS — Z9181 History of falling: Secondary | ICD-10-CM | POA: Diagnosis not present

## 2015-01-11 DIAGNOSIS — R5383 Other fatigue: Secondary | ICD-10-CM | POA: Diagnosis not present

## 2015-01-11 DIAGNOSIS — G609 Hereditary and idiopathic neuropathy, unspecified: Secondary | ICD-10-CM | POA: Diagnosis not present

## 2015-01-11 DIAGNOSIS — I8393 Asymptomatic varicose veins of bilateral lower extremities: Secondary | ICD-10-CM

## 2015-01-11 DIAGNOSIS — R278 Other lack of coordination: Secondary | ICD-10-CM | POA: Diagnosis not present

## 2015-01-11 DIAGNOSIS — S22000S Wedge compression fracture of unspecified thoracic vertebra, sequela: Secondary | ICD-10-CM | POA: Diagnosis not present

## 2015-01-11 DIAGNOSIS — R2689 Other abnormalities of gait and mobility: Secondary | ICD-10-CM | POA: Diagnosis not present

## 2015-01-11 DIAGNOSIS — M6281 Muscle weakness (generalized): Secondary | ICD-10-CM | POA: Diagnosis not present

## 2015-01-11 DIAGNOSIS — G9009 Other idiopathic peripheral autonomic neuropathy: Secondary | ICD-10-CM | POA: Diagnosis not present

## 2015-01-11 DIAGNOSIS — G63 Polyneuropathy in diseases classified elsewhere: Secondary | ICD-10-CM | POA: Diagnosis not present

## 2015-01-11 DIAGNOSIS — Z85820 Personal history of malignant melanoma of skin: Secondary | ICD-10-CM | POA: Diagnosis not present

## 2015-01-11 DIAGNOSIS — E785 Hyperlipidemia, unspecified: Secondary | ICD-10-CM

## 2015-01-11 DIAGNOSIS — S22000A Wedge compression fracture of unspecified thoracic vertebra, initial encounter for closed fracture: Secondary | ICD-10-CM | POA: Insufficient documentation

## 2015-01-11 DIAGNOSIS — S81802S Unspecified open wound, left lower leg, sequela: Secondary | ICD-10-CM

## 2015-01-11 DIAGNOSIS — I350 Nonrheumatic aortic (valve) stenosis: Secondary | ICD-10-CM

## 2015-01-11 DIAGNOSIS — E559 Vitamin D deficiency, unspecified: Secondary | ICD-10-CM

## 2015-01-11 DIAGNOSIS — S22008G Other fracture of unspecified thoracic vertebra, subsequent encounter for fracture with delayed healing: Secondary | ICD-10-CM | POA: Diagnosis not present

## 2015-01-11 DIAGNOSIS — K219 Gastro-esophageal reflux disease without esophagitis: Secondary | ICD-10-CM

## 2015-01-11 DIAGNOSIS — I10 Essential (primary) hypertension: Secondary | ICD-10-CM

## 2015-01-11 DIAGNOSIS — S81832S Puncture wound without foreign body, left lower leg, sequela: Secondary | ICD-10-CM

## 2015-01-11 DIAGNOSIS — M436 Torticollis: Secondary | ICD-10-CM | POA: Diagnosis not present

## 2015-01-11 DIAGNOSIS — Z66 Do not resuscitate: Secondary | ICD-10-CM

## 2015-01-11 MED ORDER — VITAMIN D 50 MCG (2000 UT) PO CAPS: 2000.0000 [IU] | ORAL_CAPSULE | Freq: Every day | ORAL | Status: DC

## 2015-01-11 NOTE — Progress Notes (Signed)
Patient ID: Natalie Lam, female   DOB: Dec 10, 1931, 81 y.o.   MRN: [id number]   Location:  [facility]  Goals of Care:Advanced Directive information Does patient have an advance directive?: Yes, Type of Advance Directive: [place];Living will, Does patient want to make changes to advanced directive?: No - Patient declined  Chief Complaint  Patient presents with  . Medical Management of Chronic Issues    New patient to get est with [facility]. Here with daughter [name]    HPI: Patient is a 81 y.o. white female seen in the [facility] today to establish with the practice and to manage her medical conditions.  Says she occasionally notes numbness/pain.  Pain has come and gone for years.  Admits to difficulty with walking since her fall in March.  Had PT for one month after that.  Has a terrible fear of falling.  Has difficulty getting started.  Gets better after she gets going.  Will have to turn around 2-3 times to get going.  Previously, the PT had been more for her spinal decompression.    When she had her fall on the way to the mailbox, she hit her elbow. Feet had been on ice.     Othe time in March, she hit her lip, was able to get back onto the carpet.     Had the surgery on her left arm for the melanoma.    Hit the elbow slightly and has bruises on left arm.    Left lower leg chronic wound where she hit a gardenia bush.  Bled and didn't tell anyone.  Has gotten keflex for 10 days 6-7 wks ago.  Last Monday, she went to [facility] and she's been on clindamycin 336m po tid for 10 days from 8/8.  Also has diflucan to prevent yeast infection only if she gets it.  Xrays were negative for foreign body/osteo.  PRN probiotic only for as needed if she gets.    Digestive enzymes two in am if eats greasy foods, seeds or milk.       Needs cataract surgery and had clearance, but not done yet. Fall in March interfered.  Mentioned that Dr.  MEllie Lunchcomes here and she's in the same practice as her current ophthalmologist.  Senile osteoporosis: S/P 5 yrs Actonel & 1.5 yrs of Boniva No PMH of fractures No FH Osteoporosis BMD 10/12: T score -3.6 @ forearm ; 5.2 % decrease @ R hip  Has thoracic compression fxs on imaging.  Last physical with Dr. HLinna Darnerwas in July before [facility]    Review of Systems:  Review of Systems  Constitutional: Negative for fever, chills and weight loss.  HENT: Positive for hearing loss. Negative for congestion, ear pain and tinnitus.   Eyes: Positive for blurred vision.       Wears glasses; needs cataract surgery  Respiratory: Positive for shortness of breath. Negative for cough and sputum production.        On exertion; says she doesn't do as much as she used to; had some emphysematous changes on xrays recently  Cardiovascular: Positive for leg swelling. Negative for chest pain.       Chronic venous insufficiency and varicosities  Gastrointestinal: Positive for heartburn and constipation. Negative for abdominal pain, blood in stool and melena.       Has good appetite most of the time; constipation sometimes--war prune juice helps, but not a problem if  she eats enough fiber  Genitourinary: Positive for frequency. Negative for urgency.       Does drink a lot of fluids; will just get up once early in the am if drinks late at night  Musculoskeletal: Positive for falls.  Skin:       Place on left leg/open, but healing slowly; left arm abrasion  Neurological: Positive for tingling and sensory change. Negative for headaches.       No vertigo; balance is poor with neuropathy  Endo/Heme/Allergies: Bruises/bleeds easily.  Psychiatric/Behavioral: Positive for memory loss. The patient does not have insomnia.        She says she's having difficulty adjusting to a new location with finding items--missed only 1 on MMSE, failed clock; has had a lot of stress over the past couple of years with losses  and moving    Past Medical History  Diagnosis Date  . Hyperlipidemia   . Hypothyroidism   . Gilbert syndrome   . Rheumatic fever   . Osteoporosis   . Aortic stenosis, mild   . HTN (hypertension)   . Melanoma   . Neuropathy     Past Surgical History  Procedure Laterality Date  . Abdominal hysterectomy  1963    no BSO; no abnormal PAPs  . Hemorrhoid surgery  1972  . Colonoscopy  2000    negative; [place]  . Melanoma excision  08/2014    Social History:   reports that she has never smoked. She has never used smokeless tobacco. She reports that she does not drink alcohol or use illicit drugs.   Family History  Problem Relation Age of Onset  . Dementia Mother     Alzheimers  . Stroke Mother     ? 11  . Cancer Son     multiple myeloma  . Lung cancer Maternal Uncle     smoker  . Pancreatic cancer Son   . Heart attack Neg Hx   . Diabetes Neg Hx   . Hearing loss Neg Hx   . Heart disease Brother     Allergies  Allergen Reactions  . Sulfonamide Derivatives     REACTION:generalized  swelling  . Sulfa Antibiotics Swelling    Medications: Patient's Medications  New Prescriptions   CHOLECALCIFEROL (VITAMIN D) 2000 UNITS CAPS    Take 1 capsule (2,000 Units total) by mouth daily.  Previous Medications   CALCIUM CARBONATE-VITAMIN D (CALCIUM 600/VITAMIN D PO)    Take by mouth daily.   CLINDAMYCIN (CLEOCIN) 300 MG CAPSULE    Take 1 capsule (300 mg total) by mouth 3 (three) times daily. X 10 days   DIGESTIVE ENZYMES TABS    Take by mouth. 1-2 BY MOUTH DAILY   FLUCONAZOLE (DIFLUCAN) 150 MG TABLET    Take 1 tablet (150 mg total) by mouth daily as needed.   HYDROCHLOROTHIAZIDE (MICROZIDE) 12.5 MG CAPSULE    Take 1 capsule (12.5 mg total) by mouth daily.   LEVOTHYROXINE (SYNTHROID, LEVOTHROID) 75 MCG TABLET    1 by mouth daily EXCEPT 1 1/2 on Tue/Thurs AND Sat, recheck labs 10 weeks   LOSARTAN (COZAAR) 50 MG TABLET    1 bid   MULTIPLE VITAMIN (MULTIVITAMIN) CAPSULE    Take 1  capsule by mouth daily.     PRAVASTATIN (PRAVACHOL) 20 MG TABLET    TAKE 1 BY MOUTH DAILY  Modified Medications   No medications on file  Discontinued Medications   No medications on file     Physical Exam: Danley Danker  Vitals:   01/11/15 1002  BP: 120/78  Pulse: 60  Temp: 97.6 F (36.4 C)  TempSrc: Oral  Height: 5' 0.5" (1.537 m)  Weight: 119 lb (53.978 kg)  SpO2: 99%   Body mass index is 22.85 kg/(m^2). Physical Exam  Constitutional: She is oriented to person, place, and time. She appears well-developed and well-nourished. No distress.  HENT:  Head: Normocephalic and atraumatic.  Right Ear: External ear normal.  Left Ear: External ear normal.  Nose: Nose normal.  Mouth/Throat: Oropharynx is clear and moist. No oropharyngeal exudate.  Hearing loss--hearing aides, but still does not hear well  Eyes: Conjunctivae and EOM are normal. Pupils are equal, round, and reactive to light.  glasses  Neck: Normal range of motion. Neck supple. No JVD present. No tracheal deviation present. No thyromegaly present.  Cardiovascular: Normal rate, regular rhythm and intact distal pulses.  Exam reveals no gallop and no friction rub.   Murmur heard. Pulmonary/Chest: Effort normal and breath sounds normal. No respiratory distress. She has no wheezes.  Abdominal: Soft. Bowel sounds are normal. She exhibits distension. She exhibits no mass. There is no tenderness. There is no rebound and no guarding. No hernia.  Musculoskeletal: Normal range of motion. She exhibits no edema or tenderness.  Kyphoscoliosis severe  Neurological: She is alert and oriented to person, place, and time. She has normal reflexes. No cranial nerve deficit.  Short term memory loss notable during appt--repeated questions and information several times; GAIT:  Had a hard time starting to walk, likes to hold onto furniture, takes short steps, but then festinating gait; stooped posture  Skin:  Left lateral shin with wound that is  dressed; has chronic venous insufficiency changes of both legs  Psychiatric: She has a normal mood and affect. Her behavior is normal. Judgment and thought content normal.     Labs reviewed: Basic Metabolic Panel:  Recent Labs  01/21/14 1012  08/11/14 2109 08/12/14 0308 12/09/14 1155 01/02/15 1550  NA 140  < >  --  138 141 143  K 4.2  < >  --  3.5 3.7 3.8  CL 104  < >  --  107 104 106  CO2 30  < >  --  26 31 29   GLUCOSE 78  < >  --  85 85 85  BUN 19  < >  --  16 29* 36*  CREATININE 0.9  < >  --  0.80 0.96 1.02*  CALCIUM 9.2  < >  --  8.6 9.5 9.0  MG  --   --  2.0  --   --   --   PHOS  --   --  2.7  --   --   --   TSH 2.53  --  2.818  --  5.44*  --   < > = values in this interval not displayed. Liver Function Tests:  Recent Labs  01/21/14 1012 08/11/14 1300 12/09/14 1155  AST 30 33 29  ALT 22 20 22   ALKPHOS 31* 34* 35*  BILITOT 1.9* 1.3* 1.3*  PROT 6.7 6.4 6.8  ALBUMIN 3.8 3.6 4.1   No results for input(s): LIPASE, AMYLASE in the last 8760 hours. No results for input(s): AMMONIA in the last 8760 hours. CBC:  Recent Labs  08/11/14 1300 08/12/14 0308 12/09/14 1155 01/02/15 1550  WBC 5.5 6.6 5.7 6.1  NEUTROABS 4.3  --  4.1 4.4  HGB 14.4 14.2 14.8 13.5  HCT 43.8 43.0 44.6 41.5  MCV  98.2 96.6 95.7 98.8  PLT 145* 138* 160.0 155   Lipid Panel:  Recent Labs  01/21/14 1012 08/12/14 0308  CHOL 227* 199  HDL 94.00 87  LDLCALC 125* 104*  TRIG 41.0 38  CHOLHDL 2 2.3    Patient Care Team: Gayland Curry, DO as PCP - General (Geriatric Medicine) Jola Schmidt, MD as Consulting Physician (Ophthalmology) Ludwig Clarks, DO as Consulting Physician (Neurology) Peter M Martinique, MD as Consulting Physician (Cardiology)  Assessment/Plan 1. Hereditary and idiopathic peripheral neuropathy -this has been her largest problem--interfering dramatically in her walking and safety -referral written for PT to help her with her gait, balance, safety awareness, use of  assistive device (she uses the rails in [place], but will not always have these off campus so needs to be comfortable with cane or walker) -has seen Dr. Carles Collet in reference to this -does not c/o pain with this, just loss of sensation  2. Aortic valve stenosis -echo was 08/12/14 with EF 55-60% and mild stenosis with valve area of 1.53 cm^2 -no syncope, angina or dyspnea -has seen Dr. Martinique  3. History of melanoma -had wide excision of area on her left arm -also has some fh/o melanoma and other cancers noted -no new lesions  4. Senile osteoporosis -last bone density was 01/2011 -S/P 5 yrs Actonel & 1.5 yrs of Boniva No PMH of fractures No FH Osteoporosis BMD 9/12: T score -3.6 @ forearm ; 5.2 % decrease @ R hip  Has thoracic compression fxs on imaging Is on ca with D and additional D 2000 units daily Needs repeat bone density Referred to PT so she can continue weightbearing exercise  5. Vitamin D deficiency -cont ca with D and additional D  6. Thoracic compression fracture, sequela -noted; has severe kyphoscoliosis that interferes with ability to see when ambulating--worsens her gait instability problem from her neuropathy -will need bone density and plan on prolia if she has had continued decline off oral bisphosphonates  7. Gilbert's syndrome -made note of so when we do her liver panel I am not surprised by results; chronic  8. Abnormality of gait -again, referred for PT and recommended she use a cane -has had prior falls  9. HLD (hyperlipidemia) -continues on pravachol 34m; last lipids 08/12/14 with LDL just above goal at 104--will need to reassess next time  10. THYROID NODULE, LEFT -noted on reviewed [place] tissue head/neck in 11/10 -report says no clear nodule was seen -has hypothyroidism and she takes synthroid 771m daily--reviewed importance of this being on empty stomach separate from other meds -reassess TSH next time as last TSH was slightly elevated at 5.44 in July  when she had her annual exam with Dr. HoLinna Darnernd he ordered a recheck at his office, but she won't be returning there now  1163Essential hypertension -bp well controlled with current regimen -her daughter requests that [place] check her bp daily so order was written and will monitor results -cont hctz and losartan for this -will need to hold these if she gets ill/dehydrated to prevent acute renal failure  12. Varicose veins of both lower extremities -noted along with chronic venous insufficiency changes--has wound on left leg that is slowly healing  13. Penetrating injury of lower extremity, left, sequela -had imaging showing no osteomyelitis -completing clindamycin abx for this 8/18 and is getting ongoing wound care--cont to monitor--appears to be healing -has prn order for diflucan if she gets a yeast infection from the abx but she is not  having a problem and probiotic was ordered the same way  14. Gastroesophageal reflux disease, esophagitis presence not specified -she uses digestive enzymes each morning as needed for this--2 tabs right now -she notes symptoms correlated more with fatty foods and these help her so they were continued  15. Advance directive indicates patient wish for do-not-resuscitate status - reviewed and completed [facility] today -advance directives also copied and will be scanned into record - DNR (Do Not Resuscitate)  Labs/tests ordered:  Orders Placed This Encounter  Procedures  . DNR (Do Not Resuscitate)    Order Specific Question:  In the event of cardiac or respiratory ARREST    Answer:  Do not call a "code blue"    Order Specific Question:  In the event of cardiac or respiratory ARREST    Answer:  Do not perform Intubation, CPR, defibrillation or ACLS    Order Specific Question:  In the event of cardiac or respiratory ARREST    Answer:  Use medication by any route, position, wound care, and other measures to relive pain and suffering. May use oxygen, suction  and manual treatment of airway obstruction as needed for comfort.  will do labs at med mgt appt  Next appt: 3 mos med mgt  Kru Allman L. Shellie Goettl, D.O. [employer] [address], [place] [address] Cell Phone (Mon-Fri 8am-5pm):  [phone] On Call:  [phone] & follow prompts after 5pm & weekends Office Phone:  [phone] Office Fax:  [phone]

## 2015-01-12 DIAGNOSIS — G9009 Other idiopathic peripheral autonomic neuropathy: Secondary | ICD-10-CM | POA: Diagnosis not present

## 2015-01-12 DIAGNOSIS — M6281 Muscle weakness (generalized): Secondary | ICD-10-CM | POA: Diagnosis not present

## 2015-01-12 DIAGNOSIS — I350 Nonrheumatic aortic (valve) stenosis: Secondary | ICD-10-CM | POA: Diagnosis not present

## 2015-01-12 DIAGNOSIS — G63 Polyneuropathy in diseases classified elsewhere: Secondary | ICD-10-CM | POA: Diagnosis not present

## 2015-01-12 DIAGNOSIS — R278 Other lack of coordination: Secondary | ICD-10-CM | POA: Diagnosis not present

## 2015-01-12 DIAGNOSIS — M436 Torticollis: Secondary | ICD-10-CM | POA: Diagnosis not present

## 2015-01-13 DIAGNOSIS — R278 Other lack of coordination: Secondary | ICD-10-CM | POA: Diagnosis not present

## 2015-01-13 DIAGNOSIS — M436 Torticollis: Secondary | ICD-10-CM | POA: Diagnosis not present

## 2015-01-13 DIAGNOSIS — G63 Polyneuropathy in diseases classified elsewhere: Secondary | ICD-10-CM | POA: Diagnosis not present

## 2015-01-13 DIAGNOSIS — G9009 Other idiopathic peripheral autonomic neuropathy: Secondary | ICD-10-CM | POA: Diagnosis not present

## 2015-01-13 DIAGNOSIS — M6281 Muscle weakness (generalized): Secondary | ICD-10-CM | POA: Diagnosis not present

## 2015-01-13 DIAGNOSIS — I350 Nonrheumatic aortic (valve) stenosis: Secondary | ICD-10-CM | POA: Diagnosis not present

## 2015-01-16 DIAGNOSIS — M436 Torticollis: Secondary | ICD-10-CM | POA: Diagnosis not present

## 2015-01-16 DIAGNOSIS — M6281 Muscle weakness (generalized): Secondary | ICD-10-CM | POA: Diagnosis not present

## 2015-01-16 DIAGNOSIS — G63 Polyneuropathy in diseases classified elsewhere: Secondary | ICD-10-CM | POA: Diagnosis not present

## 2015-01-16 DIAGNOSIS — G9009 Other idiopathic peripheral autonomic neuropathy: Secondary | ICD-10-CM | POA: Diagnosis not present

## 2015-01-16 DIAGNOSIS — R278 Other lack of coordination: Secondary | ICD-10-CM | POA: Diagnosis not present

## 2015-01-16 DIAGNOSIS — I350 Nonrheumatic aortic (valve) stenosis: Secondary | ICD-10-CM | POA: Diagnosis not present

## 2015-01-18 DIAGNOSIS — I350 Nonrheumatic aortic (valve) stenosis: Secondary | ICD-10-CM | POA: Diagnosis not present

## 2015-01-18 DIAGNOSIS — M436 Torticollis: Secondary | ICD-10-CM | POA: Diagnosis not present

## 2015-01-18 DIAGNOSIS — R278 Other lack of coordination: Secondary | ICD-10-CM | POA: Diagnosis not present

## 2015-01-18 DIAGNOSIS — M6281 Muscle weakness (generalized): Secondary | ICD-10-CM | POA: Diagnosis not present

## 2015-01-18 DIAGNOSIS — G63 Polyneuropathy in diseases classified elsewhere: Secondary | ICD-10-CM | POA: Diagnosis not present

## 2015-01-18 DIAGNOSIS — G9009 Other idiopathic peripheral autonomic neuropathy: Secondary | ICD-10-CM | POA: Diagnosis not present

## 2015-01-20 DIAGNOSIS — I350 Nonrheumatic aortic (valve) stenosis: Secondary | ICD-10-CM | POA: Diagnosis not present

## 2015-01-20 DIAGNOSIS — R278 Other lack of coordination: Secondary | ICD-10-CM | POA: Diagnosis not present

## 2015-01-20 DIAGNOSIS — G63 Polyneuropathy in diseases classified elsewhere: Secondary | ICD-10-CM | POA: Diagnosis not present

## 2015-01-20 DIAGNOSIS — M436 Torticollis: Secondary | ICD-10-CM | POA: Diagnosis not present

## 2015-01-20 DIAGNOSIS — G9009 Other idiopathic peripheral autonomic neuropathy: Secondary | ICD-10-CM | POA: Diagnosis not present

## 2015-01-20 DIAGNOSIS — M6281 Muscle weakness (generalized): Secondary | ICD-10-CM | POA: Diagnosis not present

## 2015-01-23 DIAGNOSIS — G63 Polyneuropathy in diseases classified elsewhere: Secondary | ICD-10-CM | POA: Diagnosis not present

## 2015-01-23 DIAGNOSIS — R278 Other lack of coordination: Secondary | ICD-10-CM | POA: Diagnosis not present

## 2015-01-23 DIAGNOSIS — G9009 Other idiopathic peripheral autonomic neuropathy: Secondary | ICD-10-CM | POA: Diagnosis not present

## 2015-01-23 DIAGNOSIS — M436 Torticollis: Secondary | ICD-10-CM | POA: Diagnosis not present

## 2015-01-23 DIAGNOSIS — I350 Nonrheumatic aortic (valve) stenosis: Secondary | ICD-10-CM | POA: Diagnosis not present

## 2015-01-23 DIAGNOSIS — M6281 Muscle weakness (generalized): Secondary | ICD-10-CM | POA: Diagnosis not present

## 2015-01-24 DIAGNOSIS — G9009 Other idiopathic peripheral autonomic neuropathy: Secondary | ICD-10-CM | POA: Diagnosis not present

## 2015-01-24 DIAGNOSIS — M6281 Muscle weakness (generalized): Secondary | ICD-10-CM | POA: Diagnosis not present

## 2015-01-24 DIAGNOSIS — M436 Torticollis: Secondary | ICD-10-CM | POA: Diagnosis not present

## 2015-01-24 DIAGNOSIS — G63 Polyneuropathy in diseases classified elsewhere: Secondary | ICD-10-CM | POA: Diagnosis not present

## 2015-01-24 DIAGNOSIS — R278 Other lack of coordination: Secondary | ICD-10-CM | POA: Diagnosis not present

## 2015-01-24 DIAGNOSIS — I350 Nonrheumatic aortic (valve) stenosis: Secondary | ICD-10-CM | POA: Diagnosis not present

## 2015-01-25 DIAGNOSIS — I350 Nonrheumatic aortic (valve) stenosis: Secondary | ICD-10-CM | POA: Diagnosis not present

## 2015-01-25 DIAGNOSIS — M6281 Muscle weakness (generalized): Secondary | ICD-10-CM | POA: Diagnosis not present

## 2015-01-25 DIAGNOSIS — M436 Torticollis: Secondary | ICD-10-CM | POA: Diagnosis not present

## 2015-01-25 DIAGNOSIS — Z029 Encounter for administrative examinations, unspecified: Secondary | ICD-10-CM

## 2015-01-25 DIAGNOSIS — G63 Polyneuropathy in diseases classified elsewhere: Secondary | ICD-10-CM | POA: Diagnosis not present

## 2015-01-25 DIAGNOSIS — R278 Other lack of coordination: Secondary | ICD-10-CM | POA: Diagnosis not present

## 2015-01-25 DIAGNOSIS — G9009 Other idiopathic peripheral autonomic neuropathy: Secondary | ICD-10-CM | POA: Diagnosis not present

## 2015-01-27 DIAGNOSIS — I350 Nonrheumatic aortic (valve) stenosis: Secondary | ICD-10-CM | POA: Diagnosis not present

## 2015-01-27 DIAGNOSIS — M6281 Muscle weakness (generalized): Secondary | ICD-10-CM | POA: Diagnosis not present

## 2015-01-27 DIAGNOSIS — R278 Other lack of coordination: Secondary | ICD-10-CM | POA: Diagnosis not present

## 2015-01-27 DIAGNOSIS — G63 Polyneuropathy in diseases classified elsewhere: Secondary | ICD-10-CM | POA: Diagnosis not present

## 2015-01-27 DIAGNOSIS — G9009 Other idiopathic peripheral autonomic neuropathy: Secondary | ICD-10-CM | POA: Diagnosis not present

## 2015-01-27 DIAGNOSIS — M436 Torticollis: Secondary | ICD-10-CM | POA: Diagnosis not present

## 2015-01-27 DIAGNOSIS — S22008G Other fracture of unspecified thoracic vertebra, subsequent encounter for fracture with delayed healing: Secondary | ICD-10-CM | POA: Diagnosis not present

## 2015-01-27 DIAGNOSIS — R2681 Unsteadiness on feet: Secondary | ICD-10-CM | POA: Diagnosis not present

## 2015-01-27 DIAGNOSIS — Z9181 History of falling: Secondary | ICD-10-CM | POA: Diagnosis not present

## 2015-01-27 DIAGNOSIS — R2689 Other abnormalities of gait and mobility: Secondary | ICD-10-CM | POA: Diagnosis not present

## 2015-01-27 DIAGNOSIS — R5383 Other fatigue: Secondary | ICD-10-CM | POA: Diagnosis not present

## 2015-01-30 ENCOUNTER — Encounter (HOSPITAL_BASED_OUTPATIENT_CLINIC_OR_DEPARTMENT_OTHER): Payer: Self-pay

## 2015-01-30 ENCOUNTER — Emergency Department (HOSPITAL_BASED_OUTPATIENT_CLINIC_OR_DEPARTMENT_OTHER): Payer: Medicare Other

## 2015-01-30 ENCOUNTER — Emergency Department (HOSPITAL_BASED_OUTPATIENT_CLINIC_OR_DEPARTMENT_OTHER)
Admission: EM | Admit: 2015-01-30 | Discharge: 2015-01-30 | Disposition: A | Discharge status: Home / Self Care | Payer: Medicare Other | Attending: Emergency Medicine | Admitting: Emergency Medicine

## 2015-01-30 DIAGNOSIS — M7989 Other specified soft tissue disorders: Secondary | ICD-10-CM | POA: Insufficient documentation

## 2015-01-30 DIAGNOSIS — Z85820 Personal history of malignant melanoma of skin: Secondary | ICD-10-CM | POA: Diagnosis not present

## 2015-01-30 DIAGNOSIS — E785 Hyperlipidemia, unspecified: Secondary | ICD-10-CM | POA: Diagnosis not present

## 2015-01-30 DIAGNOSIS — Z79899 Other long term (current) drug therapy: Secondary | ICD-10-CM | POA: Diagnosis not present

## 2015-01-30 DIAGNOSIS — I10 Essential (primary) hypertension: Secondary | ICD-10-CM | POA: Diagnosis not present

## 2015-01-30 DIAGNOSIS — M810 Age-related osteoporosis without current pathological fracture: Secondary | ICD-10-CM | POA: Diagnosis not present

## 2015-01-30 DIAGNOSIS — E039 Hypothyroidism, unspecified: Secondary | ICD-10-CM | POA: Diagnosis not present

## 2015-01-30 DIAGNOSIS — Z8669 Personal history of other diseases of the nervous system and sense organs: Secondary | ICD-10-CM | POA: Insufficient documentation

## 2015-01-30 LAB — CBC WITH DIFFERENTIAL/PLATELET
Basophils Absolute: 0 10*3/uL (ref 0.0–0.1)
Basophils Relative: 0 % (ref 0–1)
Eosinophils Absolute: 0 10*3/uL (ref 0.0–0.7)
Eosinophils Relative: 1 % (ref 0–5)
HEMATOCRIT: 41.2 % (ref 36.0–46.0)
Hemoglobin: 13.5 g/dL (ref 12.0–15.0)
Lymphocytes Relative: 17 % (ref 12–46)
Lymphs Abs: 0.8 10*3/uL (ref 0.7–4.0)
MCH: 32.4 pg (ref 26.0–34.0)
MCHC: 32.8 g/dL (ref 30.0–36.0)
MCV: 98.8 fL (ref 78.0–100.0)
MONOS PCT: 8 % (ref 3–12)
Monocytes Absolute: 0.4 10*3/uL (ref 0.1–1.0)
NEUTROS ABS: 3.4 10*3/uL (ref 1.7–7.7)
NEUTROS PCT: 74 % (ref 43–77)
Platelets: 132 10*3/uL — ABNORMAL LOW (ref 150–400)
RBC: 4.17 MIL/uL (ref 3.87–5.11)
RDW: 13.4 % (ref 11.5–15.5)
WBC: 4.5 10*3/uL (ref 4.0–10.5)

## 2015-01-30 LAB — COMPREHENSIVE METABOLIC PANEL
ALT: 21 U/L (ref 14–54)
AST: 32 U/L (ref 15–41)
Albumin: 3.5 g/dL (ref 3.5–5.0)
Alkaline Phosphatase: 32 U/L — ABNORMAL LOW (ref 38–126)
Anion gap: 6 (ref 5–15)
BUN: 21 mg/dL — ABNORMAL HIGH (ref 6–20)
CO2: 29 mmol/L (ref 22–32)
Calcium: 8.7 mg/dL — ABNORMAL LOW (ref 8.9–10.3)
Chloride: 106 mmol/L (ref 101–111)
Creatinine, Ser: 0.79 mg/dL (ref 0.44–1.00)
GFR calc Af Amer: >60 mL/min (ref >60)
GFR calc non Af Amer: >60 mL/min (ref >60)
Glucose, Bld: 90 mg/dL (ref 65–99)
POTASSIUM: 3.6 mmol/L (ref 3.5–5.1)
SODIUM: 141 mmol/L (ref 135–145)
Total Bilirubin: 0.8 mg/dL (ref 0.3–1.2)
Total Protein: 6.3 g/dL — ABNORMAL LOW (ref 6.5–8.1)

## 2015-01-30 LAB — PROTIME-INR
INR: 1.07 (ref 0.00–1.49)
Prothrombin Time: 14.1 seconds (ref 11.6–15.2)

## 2015-01-30 LAB — D-DIMER, QUANTITATIVE: D-Dimer, Quant: 1.2 ug/mL-FEU — ABNORMAL HIGH (ref 0.00–0.48)

## 2015-01-30 NOTE — ED Provider Notes (Signed)
CSN: [id number]     Arrival date & time 01/30/15  1134 History   First MD Initiated Contact with Patient 01/30/15 1208     Chief Complaint  Patient presents with  . Leg Swelling     (Consider location/radiation/quality/duration/timing/severity/associated sxs/prior Treatment) HPI Comments: Patient is an 81 year old female with history of hypertension, hyperlipidemia, Gilbert syndrome, and recent penetrating wound to the left leg with infection requiring antibiotics. She presents today for evaluation of swelling of her left calf in the absence of any injury or trauma. She denies any calf pain, chest pain, or shortness of breath. She is concerned about the possibility of a blood clot.  The history is provided by the patient.    Past Medical History  Diagnosis Date  . Hyperlipidemia   . Hypothyroidism   . Gilbert syndrome   . Rheumatic fever   . Osteoporosis   . Aortic stenosis, mild   . HTN (hypertension)   . Melanoma   . Neuropathy    Past Surgical History  Procedure Laterality Date  . Abdominal hysterectomy  1963    no BSO; no abnormal PAPs  . Hemorrhoid surgery  1972  . Colonoscopy  2000    negative; [place]  . Melanoma excision  08/2014   Family History  Problem Relation Age of Onset  . Dementia Mother     Alzheimers  . Stroke Mother     ? 61  . Cancer Son     multiple myeloma  . Lung cancer Maternal Uncle     smoker  . Pancreatic cancer Son   . Heart attack Neg Hx   . Diabetes Neg Hx   . Hearing loss Neg Hx   . Heart disease Brother    Social History  Substance Use Topics  . Smoking status: Never Smoker   . Smokeless tobacco: Never Used  . Alcohol Use: No   OB History    No data available     Review of Systems  All other systems reviewed and are negative.     Allergies  Sulfonamide derivatives and Sulfa antibiotics  Home Medications   Prior to Admission medications   Medication Sig Start Date End Date Taking? Authorizing Provider  Omeprazole  (PRILOSEC PO) Take by mouth.   Yes Historical Provider, MD  Calcium Carbonate-Vitamin D (CALCIUM 600/VITAMIN D PO) Take by mouth daily.    Historical Provider, MD  Cholecalciferol (VITAMIN D) 2000 UNITS CAPS Take 1 capsule (2,000 Units total) by mouth daily. 01/11/15   [name] [name], DO  clindamycin (CLEOCIN) 300 MG capsule Take 1 capsule (300 mg total) by mouth 3 (three) times daily. X 10 days 01/02/15   Wandra Arthurs, MD  Digestive Enzymes TABS Take by mouth. 1-2 BY MOUTH DAILY    Historical Provider, MD  hydrochlorothiazide (MICROZIDE) 12.5 MG capsule Take 1 capsule (12.5 mg total) by mouth daily. 08/24/14   Hendricks Limes, MD  levothyroxine (SYNTHROID, LEVOTHROID) 75 MCG tablet 1 by mouth daily EXCEPT 1 1/2 on Tue/Thurs AND Sat, recheck labs 10 weeks 09/15/14   Hendricks Limes, MD  losartan (COZAAR) 50 MG tablet 1 bid 12/13/14   Hendricks Limes, MD  Multiple Vitamin (MULTIVITAMIN) capsule Take 1 capsule by mouth daily.      Historical Provider, MD  pravastatin (PRAVACHOL) 20 MG tablet TAKE 1 BY MOUTH DAILY 09/15/14   Hendricks Limes, MD   BP 137/71 mmHg  Pulse 57  Temp(Src) 98.2 F (36.8 C) (Oral)  Resp 16  Ht 5' 1"  (1.549 m)  Wt 120 lb (54.432 kg)  BMI 22.69 kg/m2  SpO2 97% Physical Exam  Constitutional: She is oriented to person, place, and time. She appears well-developed and well-nourished. No distress.  HENT:  Head: Normocephalic and atraumatic.  Neck: Normal range of motion. Neck supple.  Cardiovascular: Normal rate and regular rhythm.  Exam reveals no gallop and no friction rub.   No murmur heard. Pulmonary/Chest: Effort normal and breath sounds normal. No respiratory distress. She has no wheezes.  Abdominal: Soft. Bowel sounds are normal. She exhibits no distension. There is no tenderness.  Musculoskeletal: Normal range of motion.  There is slight swelling of the left calf with no overlying warmth, erythema, or palpable abnormality. DP pulses are easily palpable bilaterally.  Homans sign is absent. Sensation is intact to the remainder of the extremity and right extremity.  Neurological: She is alert and oriented to person, place, and time.  Skin: Skin is warm and dry. She is not diaphoretic.  Nursing note and vitals reviewed.   ED Course  Procedures (including critical care time) Labs Review Labs Reviewed  COMPREHENSIVE METABOLIC PANEL  CBC WITH DIFFERENTIAL/PLATELET  D-DIMER, QUANTITATIVE (NOT AT [facility])  PROTIME-INR    Imaging Review No results found. I have personally reviewed and evaluated these images and lab results as part of my medical decision-making.   EKG Interpretation None      MDM   Final diagnoses:  None    Ultrasound reveals no evidence for DVT. I doubt this is cellulitis as there is no warmth, redness, or fluctuance that would suggest an abscess. Remainder of the laboratory studies are essentially unremarkable with the exception of a d-dimer of 1.2. I suspect this is likely age-related as the ultrasound is negative. She is having no chest pain or shortness of breath that makes me feel the need to pursue a pulmonary embolism. At this point she will be discharged to home with instructions to elevate her legs, rest, and follow-up as needed if symptoms worsen or change.    Veryl Speak, MD 01/30/15 [phone]

## 2015-01-30 NOTE — ED Notes (Signed)
Left leg swelling since Friday-recent cellulitis with treatment

## 2015-01-30 NOTE — Discharge Instructions (Signed)
Elevate your legs as frequently as possible for the next several days.  Return to the emergency department if you develop redness, pain, high fever, chest pain, or shortness of breath.  Follow up with your primary Dr. if not improving in the next week.   Edema Edema is an abnormal buildup of fluids in your bodytissues. Edema is somewhatdependent on gravity to pull the fluid to the lowest place in your body. That makes the condition more common in the legs and thighs (lower extremities). Painless swelling of the feet and ankles is common and becomes more likely as you get older. It is also common in looser tissues, like around your eyes.  When the affected area is squeezed, the fluid may move out of that spot and leave a dent for a few moments. This dent is called pitting.  CAUSES  There are many possible causes of edema. Eating too much salt and being on your feet or sitting for a long time can cause edema in your legs and ankles. Hot weather may make edema worse. Common medical causes of edema include:  Heart failure.  Liver disease.  Kidney disease.  Weak blood vessels in your legs.  Cancer.  An injury.  Pregnancy.  Some medications.  Obesity. SYMPTOMS  Edema is usually painless.Your skin may look swollen or shiny.  DIAGNOSIS  Your health care provider may be able to diagnose edema by asking about your medical history and doing a physical exam. You may need to have tests such as X-rays, an electrocardiogram, or blood tests to check for medical conditions that may cause edema.  TREATMENT  Edema treatment depends on the cause. If you have heart, liver, or kidney disease, you need the treatment appropriate for these conditions. General treatment may include:  Elevation of the affected body part above the level of your heart.  Compression of the affected body part. Pressure from elastic bandages or support stockings squeezes the tissues and forces fluid back into the blood  vessels. This keeps fluid from entering the tissues.  Restriction of fluid and salt intake.  Use of a water pill (diuretic). These medications are appropriate only for some types of edema. They pull fluid out of your body and make you urinate more often. This gets rid of fluid and reduces swelling, but diuretics can have side effects. Only use diuretics as directed by your health care provider. HOME CARE INSTRUCTIONS   Keep the affected body part above the level of your heart when you are lying down.   Do not sit still or stand for prolonged periods.   Do not put anything directly under your knees when lying down.  Do not wear constricting clothing or garters on your upper legs.   Exercise your legs to work the fluid back into your blood vessels. This may help the swelling go down.   Wear elastic bandages or support stockings to reduce ankle swelling as directed by your health care provider.   Eat a low-salt diet to reduce fluid if your health care provider recommends it.   Only take medicines as directed by your health care provider. SEEK MEDICAL CARE IF:   Your edema is not responding to treatment.  You have heart, liver, or kidney disease and notice symptoms of edema.  You have edema in your legs that does not improve after elevating them.   You have sudden and unexplained weight gain. SEEK IMMEDIATE MEDICAL CARE IF:   You develop shortness of breath or chest pain.  You cannot breathe when you lie down.  You develop pain, redness, or warmth in the swollen areas.   You have heart, liver, or kidney disease and suddenly get edema.  You have a fever and your symptoms suddenly get worse. MAKE SURE YOU:   Understand these instructions.  Will watch your condition.  Will get help right away if you are not doing well or get worse. Document Released: 05/13/2005 Document Revised: 09/27/2013 Document Reviewed: 03/05/2013 [facility] Patient Information 2015  [place], [place]. This information is not intended to replace advice given to you by your health care provider. Make sure you discuss any questions you have with your health care provider.

## 2015-01-31 DIAGNOSIS — G9009 Other idiopathic peripheral autonomic neuropathy: Secondary | ICD-10-CM | POA: Diagnosis not present

## 2015-01-31 DIAGNOSIS — M6281 Muscle weakness (generalized): Secondary | ICD-10-CM | POA: Diagnosis not present

## 2015-01-31 DIAGNOSIS — M436 Torticollis: Secondary | ICD-10-CM | POA: Diagnosis not present

## 2015-01-31 DIAGNOSIS — R278 Other lack of coordination: Secondary | ICD-10-CM | POA: Diagnosis not present

## 2015-01-31 DIAGNOSIS — I350 Nonrheumatic aortic (valve) stenosis: Secondary | ICD-10-CM | POA: Diagnosis not present

## 2015-01-31 DIAGNOSIS — G63 Polyneuropathy in diseases classified elsewhere: Secondary | ICD-10-CM | POA: Diagnosis not present

## 2015-02-01 DIAGNOSIS — M436 Torticollis: Secondary | ICD-10-CM | POA: Diagnosis not present

## 2015-02-01 DIAGNOSIS — G63 Polyneuropathy in diseases classified elsewhere: Secondary | ICD-10-CM | POA: Diagnosis not present

## 2015-02-01 DIAGNOSIS — G9009 Other idiopathic peripheral autonomic neuropathy: Secondary | ICD-10-CM | POA: Diagnosis not present

## 2015-02-01 DIAGNOSIS — M6281 Muscle weakness (generalized): Secondary | ICD-10-CM | POA: Diagnosis not present

## 2015-02-01 DIAGNOSIS — I350 Nonrheumatic aortic (valve) stenosis: Secondary | ICD-10-CM | POA: Diagnosis not present

## 2015-02-01 DIAGNOSIS — R278 Other lack of coordination: Secondary | ICD-10-CM | POA: Diagnosis not present

## 2015-02-02 ENCOUNTER — Non-Acute Institutional Stay: Payer: Medicare Other | Admitting: Adult Health

## 2015-02-02 DIAGNOSIS — M6281 Muscle weakness (generalized): Secondary | ICD-10-CM | POA: Diagnosis not present

## 2015-02-02 DIAGNOSIS — I872 Venous insufficiency (chronic) (peripheral): Secondary | ICD-10-CM

## 2015-02-02 DIAGNOSIS — I350 Nonrheumatic aortic (valve) stenosis: Secondary | ICD-10-CM | POA: Diagnosis not present

## 2015-02-02 DIAGNOSIS — L853 Xerosis cutis: Secondary | ICD-10-CM

## 2015-02-02 DIAGNOSIS — G63 Polyneuropathy in diseases classified elsewhere: Secondary | ICD-10-CM | POA: Diagnosis not present

## 2015-02-02 DIAGNOSIS — M436 Torticollis: Secondary | ICD-10-CM | POA: Diagnosis not present

## 2015-02-02 DIAGNOSIS — R278 Other lack of coordination: Secondary | ICD-10-CM | POA: Diagnosis not present

## 2015-02-02 DIAGNOSIS — G9009 Other idiopathic peripheral autonomic neuropathy: Secondary | ICD-10-CM | POA: Diagnosis not present

## 2015-02-03 DIAGNOSIS — G63 Polyneuropathy in diseases classified elsewhere: Secondary | ICD-10-CM | POA: Diagnosis not present

## 2015-02-03 DIAGNOSIS — R278 Other lack of coordination: Secondary | ICD-10-CM | POA: Diagnosis not present

## 2015-02-03 DIAGNOSIS — M436 Torticollis: Secondary | ICD-10-CM | POA: Diagnosis not present

## 2015-02-03 DIAGNOSIS — G9009 Other idiopathic peripheral autonomic neuropathy: Secondary | ICD-10-CM | POA: Diagnosis not present

## 2015-02-03 DIAGNOSIS — I350 Nonrheumatic aortic (valve) stenosis: Secondary | ICD-10-CM | POA: Diagnosis not present

## 2015-02-03 DIAGNOSIS — M6281 Muscle weakness (generalized): Secondary | ICD-10-CM | POA: Diagnosis not present

## 2015-02-06 DIAGNOSIS — G9009 Other idiopathic peripheral autonomic neuropathy: Secondary | ICD-10-CM | POA: Diagnosis not present

## 2015-02-06 DIAGNOSIS — M6281 Muscle weakness (generalized): Secondary | ICD-10-CM | POA: Diagnosis not present

## 2015-02-06 DIAGNOSIS — M436 Torticollis: Secondary | ICD-10-CM | POA: Diagnosis not present

## 2015-02-06 DIAGNOSIS — I350 Nonrheumatic aortic (valve) stenosis: Secondary | ICD-10-CM | POA: Diagnosis not present

## 2015-02-06 DIAGNOSIS — R278 Other lack of coordination: Secondary | ICD-10-CM | POA: Diagnosis not present

## 2015-02-06 DIAGNOSIS — G63 Polyneuropathy in diseases classified elsewhere: Secondary | ICD-10-CM | POA: Diagnosis not present

## 2015-02-07 ENCOUNTER — Non-Acute Institutional Stay: Payer: Medicare Other | Admitting: Internal Medicine

## 2015-02-07 ENCOUNTER — Encounter: Payer: Self-pay | Admitting: Adult Health

## 2015-02-07 DIAGNOSIS — G609 Hereditary and idiopathic neuropathy, unspecified: Secondary | ICD-10-CM

## 2015-02-07 DIAGNOSIS — M436 Torticollis: Secondary | ICD-10-CM | POA: Diagnosis not present

## 2015-02-07 DIAGNOSIS — I872 Venous insufficiency (chronic) (peripheral): Secondary | ICD-10-CM

## 2015-02-07 DIAGNOSIS — G9009 Other idiopathic peripheral autonomic neuropathy: Secondary | ICD-10-CM | POA: Diagnosis not present

## 2015-02-07 DIAGNOSIS — G63 Polyneuropathy in diseases classified elsewhere: Secondary | ICD-10-CM | POA: Diagnosis not present

## 2015-02-07 DIAGNOSIS — I350 Nonrheumatic aortic (valve) stenosis: Secondary | ICD-10-CM

## 2015-02-07 DIAGNOSIS — R278 Other lack of coordination: Secondary | ICD-10-CM | POA: Diagnosis not present

## 2015-02-07 DIAGNOSIS — M6281 Muscle weakness (generalized): Secondary | ICD-10-CM | POA: Diagnosis not present

## 2015-02-07 NOTE — Progress Notes (Signed)
Patient ID: Natalie Lam, female   DOB: 04/11/1932, 81 y.o.   MRN: [id number]     Patient Care Team: Gayland Curry, DO as PCP - General (Geriatric Medicine) Jola Schmidt, MD as Consulting Physician (Ophthalmology) Ludwig Clarks, DO as Consulting Physician (Neurology) Peter M Martinique, MD as Consulting Physician (Cardiology)  Nursing Home Location:  [place] of Service: ALF [phone])  Chief Complaint  Patient presents with  . Acute Visit    f/u edema    HPI:  81 y.o.female,  residing at [facility] [employer], [place]. I was asked to see her today due to edema in her legs, L>R.  She was seen at urgent care on 9/5 for edema on the left and a doppler returned negative. She has a hx of chronic venous insuff. She used to wear TED hose but stopped because she developed a slow healing wound on the left after hitting a bush. She was treated for cellulitis with Cleocin on 8/8 for 10 days and since that time the wound has healed. There have been no reports of fever, pain, red streaking up the leg or other systemic symptoms. Her CBC was normal on 9/5    Review of Systems:  Review of Systems  Constitutional: Negative for fever, diaphoresis, activity change and appetite change.  Respiratory: Negative for cough and shortness of breath.   Cardiovascular: Positive for leg swelling. Negative for chest pain and palpitations.       Has chronic venous stasis changes  Gastrointestinal: Negative for abdominal pain and abdominal distention.  Skin: Negative for rash and wound.  Neurological: Negative for dizziness, syncope and light-headedness.  Psychiatric/Behavioral: Negative for confusion and agitation.    Medications: Patient's Medications  New Prescriptions   No medications on file  Previous Medications   CALCIUM CARBONATE-VITAMIN D (CALCIUM 600/VITAMIN D PO)    Take by mouth daily.   CHOLECALCIFEROL (VITAMIN D) 2000 UNITS CAPS    Take 1 capsule (2,000  Units total) by mouth daily.   CLINDAMYCIN (CLEOCIN) 300 MG CAPSULE    Take 1 capsule (300 mg total) by mouth 3 (three) times daily. X 10 days   DIGESTIVE ENZYMES TABS    Take by mouth. 1-2 BY MOUTH DAILY   HYDROCHLOROTHIAZIDE (MICROZIDE) 12.5 MG CAPSULE    Take 1 capsule (12.5 mg total) by mouth daily.   LEVOTHYROXINE (SYNTHROID, LEVOTHROID) 75 MCG TABLET    1 by mouth daily EXCEPT 1 1/2 on Tue/Thurs AND Sat, recheck labs 10 weeks   LOSARTAN (COZAAR) 50 MG TABLET    1 bid   MULTIPLE VITAMIN (MULTIVITAMIN) CAPSULE    Take 1 capsule by mouth daily.     OMEPRAZOLE (PRILOSEC PO)    Take by mouth.   PRAVASTATIN (PRAVACHOL) 20 MG TABLET    TAKE 1 BY MOUTH DAILY  Modified Medications   No medications on file  Discontinued Medications   No medications on file     Physical Exam:  Filed Vitals:   02/07/15 1136  BP: 134/73  Pulse: 63  Temp: 97.4 F (36.3 C)  Resp: 17  Weight: 120 lb (54.432 kg)  SpO2: 99%    Physical Exam  Constitutional: She is oriented to person, place, and time and well-developed, well-nourished, and in no distress. No distress.  Neck: No JVD present.  Cardiovascular: Normal rate and regular rhythm.   bilat LE edema +1 with chronic venous stasis changes, no drainage, no wound, no tenderness, no warmth, no red  streaking  Pulmonary/Chest: Effort normal and breath sounds normal. No respiratory distress.  Abdominal: Soft. Bowel sounds are normal. She exhibits no distension. There is no tenderness.  Neurological: She is alert and oriented to person, place, and time.  Skin: Skin is warm and dry. She is not diaphoretic.  Very dry and scaly skin to BLE  Psychiatric: Affect normal.    Labs reviewed/Significant Diagnostic Results:  Basic Metabolic Panel:  Recent Labs  08/11/14 2109  12/09/14 1155 01/02/15 1550 01/30/15 1240  NA  --   < > 141 143 141  K  --   < > 3.7 3.8 3.6  CL  --   < > 104 106 106  CO2  --   < > 31 29 29   GLUCOSE  --   < > 85 85 90  BUN  --    < > 29* 36* 21*  CREATININE  --   < > 0.96 1.02* 0.79  CALCIUM  --   < > 9.5 9.0 8.7*  MG 2.0  --   --   --   --   PHOS 2.7  --   --   --   --   < > = values in this interval not displayed. Liver Function Tests:  Recent Labs  08/11/14 1300 12/09/14 1155 01/30/15 1240  AST 33 29 32  ALT 20 22 21   ALKPHOS 34* 35* 32*  BILITOT 1.3* 1.3* 0.8  PROT 6.4 6.8 6.3*  ALBUMIN 3.6 4.1 3.5   No results for input(s): LIPASE, AMYLASE in the last 8760 hours. No results for input(s): AMMONIA in the last 8760 hours. CBC:  Recent Labs  12/09/14 1155 01/02/15 1550 01/30/15 1240  WBC 5.7 6.1 4.5  NEUTROABS 4.1 4.4 3.4  HGB 14.8 13.5 13.5  HCT 44.6 41.5 41.2  MCV 95.7 98.8 98.8  PLT 160.0 155 132*   CBG: No results for input(s): GLUCAP in the last 8760 hours. TSH:  Recent Labs  08/11/14 2109 12/09/14 1155  TSH 2.818 5.44*   A1C: No results found for: HGBA1C Lipid Panel:  Recent Labs  08/12/14 0308  CHOL 199  HDL 87  LDLCALC 104*  TRIG 38  CHOLHDL 2.3   Reviewed left lower ext doppler on 01/30/15 -negative for DVT Reviewed left tib/fib xray from 01/02/15  -negative for acute fracture or osteo    Assessment/Plan  1) Chronic venous insuff -appears stable and symmetric at this time -there are no signs of cellulitis at this time -I have let her know that she can wear TED hose now that her wound has healed and that this will help with the edema, along with elevation -she is already on HCTZ, could consider lasix but would try to avoid this at first and see if the hose work since she is not SOB and at risk for falls  2) Xerosis -eucerin nightly to [place], [place] [phone]

## 2015-02-07 NOTE — Progress Notes (Signed)
Patient ID: Natalie Lam, female   DOB: 07-23-1931, 81 y.o.   MRN: [id number]  Location:  [place] [facility] Provider:  Rexene Edison. Mariea Clonts, D.O., C.M.D.  Code Status:  DNR Goals of care: Advanced Directive information Does patient have an advance directive?: Yes, Type of Advance Directive: [place];Living will, Does patient want to make changes to advanced directive?: No - Patient declined  Chief Complaint  Patient presents with  . Acute Visit    increased edema of legs, has been eating bacon, ham, salty foods    HPI:  81 yo white female recently admitted to assisted living was seen for an acute visit.  She has a h/o venous insufficiency with a prior venous ulcer (now healed) on her left leg.  Since she's new here, she been trying out all of the food choices.  She admits to having bacon or sausage for the past three days.  She has really enjoyed it, but also has noticed that her legs have become increasingly edematous.  The tops of her compression hose were becoming painful so her daughter helped to wrap them with ace wraps instead today.  When we removed them, her feet were temporarily quite purple, but it improved.  She also has chronic peripheral neuropathy in the feet. She denies increased dyspnea, cough, congestion, orthopnea, PND, chest pain.  She agrees to reduce high sodium foods now that she is aware that they were the source of her edema.  She was not weighed today to compare with her monthly weight on 9/1.  Review of Systems:  Review of Systems  Constitutional: Negative for fever, chills, weight loss and malaise/fatigue.  HENT: Positive for hearing loss.   Eyes: Negative for blurred vision.       Glasses  Respiratory: Negative for cough and shortness of breath.   Cardiovascular: Positive for leg swelling. Negative for chest pain, palpitations, orthopnea, claudication and PND.  Gastrointestinal: Negative for abdominal pain.  Genitourinary: Positive for frequency.  Negative for dysuria.  Musculoskeletal: Negative for falls.  Neurological: Positive for sensory change. Negative for weakness.       Chronic  Psychiatric/Behavioral: Positive for memory loss.       Mild    Past Medical History  Diagnosis Date  . Hyperlipidemia   . Hypothyroidism   . Gilbert syndrome   . Rheumatic fever   . Osteoporosis   . Aortic stenosis, mild   . HTN (hypertension)   . Melanoma ([place])   . Neuropathy [facility])     Patient Active Problem List   Diagnosis Date Noted  . Thoracic compression fracture 01/11/2015  . History of melanoma 12/09/2014  . Torticollis 12/09/2014  . Abnormality of gait 12/09/2014  . Bilateral carotid bruits 08/18/2014  . Aortic valve stenosis 08/18/2014  . Syncope 08/11/2014  . Gilbert's syndrome 08/11/2014  . Heart murmur, systolic 50/53/9767  . HTN (hypertension) 06/25/2013  . Skin cancer 07/14/2012  . Hypopotassemia 04/16/2012  . Abnormal serum level of alkaline phosphatase 04/16/2012  . Esophageal reflux 06/27/2010  . THYROID NODULE, LEFT 04/18/2009  . Vitamin D deficiency 10/01/2007  . Hypothyroidism 08/21/2007  . HLD (hyperlipidemia) 08/21/2007  . Varicose veins of both lower extremities 08/21/2007  . Senile osteoporosis 08/21/2007    Allergies  Allergen Reactions  . Sulfonamide Derivatives     REACTION:generalized  swelling  . Sulfa Antibiotics Swelling    Medications: Patient's Medications  New Prescriptions   No medications on file  Previous Medications   CALCIUM CARBONATE-VITAMIN  D (CALCIUM 600/VITAMIN D PO)    Take by mouth daily.   CHOLECALCIFEROL (VITAMIN D) 2000 UNITS CAPS    Take 1 capsule (2,000 Units total) by mouth daily.   CLINDAMYCIN (CLEOCIN) 300 MG CAPSULE    Take 1 capsule (300 mg total) by mouth 3 (three) times daily. X 10 days   DIGESTIVE ENZYMES TABS    Take by mouth. 1-2 BY MOUTH DAILY   HYDROCHLOROTHIAZIDE (MICROZIDE) 12.5 MG CAPSULE    Take 1 capsule (12.5 mg total) by mouth daily.    LEVOTHYROXINE (SYNTHROID, LEVOTHROID) 75 MCG TABLET    1 by mouth daily EXCEPT 1 1/2 on Tue/Thurs AND Sat, recheck labs 10 weeks   LOSARTAN (COZAAR) 50 MG TABLET    1 bid   MULTIPLE VITAMIN (MULTIVITAMIN) CAPSULE    Take 1 capsule by mouth daily.     OMEPRAZOLE (PRILOSEC PO)    Take by mouth.   PRAVASTATIN (PRAVACHOL) 20 MG TABLET    TAKE 1 BY MOUTH DAILY  Modified Medications   No medications on file  Discontinued Medications   No medications on file    Physical Exam: Filed Vitals:   01/26/15 1212 02/07/15 1209  BP:  151/85  Pulse:  65  Temp:  98 F (36.7 C)  Resp:  16  Weight: 120 lb 4.8 oz (54.568 kg)   SpO2:  98%   Body mass index is 22.74 kg/(m^2).  Physical Exam  Constitutional: She is oriented to person, place, and time.  Thin white female, nad  Neck: No JVD present.  Cardiovascular: Normal rate, regular rhythm, normal heart sounds and intact distal pulses.   Pulmonary/Chest: Effort normal and breath sounds normal. No respiratory distress. She has no rales.  Abdominal: Soft. Bowel sounds are normal. She exhibits no distension. There is no tenderness.  Musculoskeletal: Normal range of motion.  Walks with cane outside of apt  Neurological: She is alert and oriented to person, place, and time.  Skin: Skin is warm and dry.  Feet with purplish discoloration, some chronic venous insufficiency changes, 2+ edema at present  Psychiatric: She has a normal mood and affect.    Labs reviewed: Basic Metabolic Panel:  Recent Labs  08/11/14 2109  12/09/14 1155 01/02/15 1550 01/30/15 1240  NA  --   < > 141 143 141  K  --   < > 3.7 3.8 3.6  CL  --   < > 104 106 106  CO2  --   < > 31 29 29   GLUCOSE  --   < > 85 85 90  BUN  --   < > 29* 36* 21*  CREATININE  --   < > 0.96 1.02* 0.79  CALCIUM  --   < > 9.5 9.0 8.7*  MG 2.0  --   --   --   --   PHOS 2.7  --   --   --   --   < > = values in this interval not displayed.  Liver Function Tests:  Recent Labs  08/11/14 1300  12/09/14 1155 01/30/15 1240  AST 33 29 32  ALT 20 22 21   ALKPHOS 34* 35* 32*  BILITOT 1.3* 1.3* 0.8  PROT 6.4 6.8 6.3*  ALBUMIN 3.6 4.1 3.5    CBC:  Recent Labs  12/09/14 1155 01/02/15 1550 01/30/15 1240  WBC 5.7 6.1 4.5  NEUTROABS 4.1 4.4 3.4  HGB 14.8 13.5 13.5  HCT 44.6 41.5 41.2  MCV 95.7 98.8 98.8  PLT 160.0 155 132*    Lab Results  Component Value Date   TSH 5.44* 12/09/2014   No results found for: HGBA1C Lab Results  Component Value Date   CHOL 199 08/12/2014   HDL 87 08/12/2014   LDLCALC 104* 08/12/2014   LDLDIRECT 105.9 06/15/2013   TRIG 38 08/12/2014   CHOLHDL 2.3 08/12/2014     Patient Care Team: Gayland Curry, DO as PCP - General (Geriatric Medicine) Jola Schmidt, MD as Consulting Physician (Ophthalmology) Ludwig Clarks, DO as Consulting Physician (Neurology) Peter M Martinique, MD as Consulting Physician (Cardiology)  Assessment/Plan 1. Chronic venous insufficiency -avoid sausage, bacon, ham, other notably salty foods and adding salt to food -elevate feet at rest -wear compression hose daily when up, off at hs -lasix 38m po daily for 2 days and kcl 133m daily for 2 days, then stop (is also on hctz) -cont to monitor  2. Hereditary and idiopathic peripheral neuropathy -is her primary cause of balance problems, encouraged use of assistive device on a consistent basis -cont therapy  3. Aortic valve stenosis -stable at this time, asymptomatic  Family/ staff Communication: met with pt and her daughter, ToKenney Housemanduring visit today; also coordinated care with AL nurse, nurse supervisor  Labs/tests ordered:  No new labs at present  [place]. Perl Folmar, D.O. [facility] [place] [address] [address] Phone (Mon-Fri 8am-5pm):  [phone] Call:  [phone] follow prompts after 5pm & weekends Office Phone:  [phone] Fax:  [phone]

## 2015-02-08 DIAGNOSIS — M436 Torticollis: Secondary | ICD-10-CM | POA: Diagnosis not present

## 2015-02-08 DIAGNOSIS — I350 Nonrheumatic aortic (valve) stenosis: Secondary | ICD-10-CM | POA: Diagnosis not present

## 2015-02-08 DIAGNOSIS — M6281 Muscle weakness (generalized): Secondary | ICD-10-CM | POA: Diagnosis not present

## 2015-02-08 DIAGNOSIS — R278 Other lack of coordination: Secondary | ICD-10-CM | POA: Diagnosis not present

## 2015-02-08 DIAGNOSIS — G9009 Other idiopathic peripheral autonomic neuropathy: Secondary | ICD-10-CM | POA: Diagnosis not present

## 2015-02-08 DIAGNOSIS — G63 Polyneuropathy in diseases classified elsewhere: Secondary | ICD-10-CM | POA: Diagnosis not present

## 2015-02-09 DIAGNOSIS — I350 Nonrheumatic aortic (valve) stenosis: Secondary | ICD-10-CM | POA: Diagnosis not present

## 2015-02-09 DIAGNOSIS — M6281 Muscle weakness (generalized): Secondary | ICD-10-CM | POA: Diagnosis not present

## 2015-02-09 DIAGNOSIS — M436 Torticollis: Secondary | ICD-10-CM | POA: Diagnosis not present

## 2015-02-09 DIAGNOSIS — R278 Other lack of coordination: Secondary | ICD-10-CM | POA: Diagnosis not present

## 2015-02-09 DIAGNOSIS — G63 Polyneuropathy in diseases classified elsewhere: Secondary | ICD-10-CM | POA: Diagnosis not present

## 2015-02-09 DIAGNOSIS — G9009 Other idiopathic peripheral autonomic neuropathy: Secondary | ICD-10-CM | POA: Diagnosis not present

## 2015-02-13 DIAGNOSIS — M6281 Muscle weakness (generalized): Secondary | ICD-10-CM | POA: Diagnosis not present

## 2015-02-13 DIAGNOSIS — G63 Polyneuropathy in diseases classified elsewhere: Secondary | ICD-10-CM | POA: Diagnosis not present

## 2015-02-13 DIAGNOSIS — I350 Nonrheumatic aortic (valve) stenosis: Secondary | ICD-10-CM | POA: Diagnosis not present

## 2015-02-13 DIAGNOSIS — G9009 Other idiopathic peripheral autonomic neuropathy: Secondary | ICD-10-CM | POA: Diagnosis not present

## 2015-02-13 DIAGNOSIS — M436 Torticollis: Secondary | ICD-10-CM | POA: Diagnosis not present

## 2015-02-13 DIAGNOSIS — R278 Other lack of coordination: Secondary | ICD-10-CM | POA: Diagnosis not present

## 2015-02-25 ENCOUNTER — Encounter: Payer: Self-pay | Admitting: Internal Medicine

## 2015-03-17 ENCOUNTER — Encounter: Payer: Self-pay | Admitting: Internal Medicine

## 2015-03-20 ENCOUNTER — Non-Acute Institutional Stay: Payer: Medicare Other | Admitting: Adult Health

## 2015-03-20 ENCOUNTER — Encounter: Payer: Self-pay | Admitting: Adult Health

## 2015-03-20 DIAGNOSIS — G609 Hereditary and idiopathic neuropathy, unspecified: Secondary | ICD-10-CM | POA: Diagnosis not present

## 2015-03-20 DIAGNOSIS — I872 Venous insufficiency (chronic) (peripheral): Secondary | ICD-10-CM

## 2015-03-20 NOTE — Progress Notes (Signed)
Patient ID: Natalie Lam, female   DOB: 07/16/1931, 81 y.o.   MRN: [id number]     Patient Care Team: Gayland Curry, DO as PCP - General (Geriatric Medicine) Jola Schmidt, MD as Consulting Physician (Ophthalmology) Ludwig Clarks, DO as Consulting Physician (Neurology) Peter M Martinique, MD as Consulting Physician (Cardiology)  Nursing Home Location:  [place] of Service: ALF [phone])  Chief Complaint  Patient presents with  . Acute Visit    feet burning, pain    HPI:  81 y.o.female,  residing at [facility], [place] section. She has a hx of chronic venous insuff, HTN, AS, Hypothyroidism, Gilbert syndrome, bilateral carotid bruits, OP. I was asked to see her for progressively worsening pain to her feet with burning and pins/needles sensation. She reports that she has had a it a "long time" but never reported it. It has worsened over the past two weeks and has kept her from sleeping.  She has used tylenol with some relief. She has not had any injury to her feet. She has long standing issues with edema and wears compression hose which has helped.     Review of Systems:  Review of Systems  Constitutional: Negative for fever, diaphoresis, activity change and appetite change.  Respiratory: Negative for cough and shortness of breath.   Cardiovascular: Positive for leg swelling. Negative for chest pain and palpitations.       Has chronic venous stasis changes  Gastrointestinal: Negative for abdominal pain and abdominal distention.  Skin: Negative for rash and wound.  Neurological: Positive for numbness. Negative for dizziness, syncope and light-headedness.       Burning painful feet, heel, toes  Psychiatric/Behavioral: Negative for confusion and agitation.    Medications: Patient's Medications  New Prescriptions   No medications on file  Previous Medications   CALCIUM CARBONATE-VITAMIN D (CALCIUM 600/VITAMIN D PO)    Take by mouth daily.   CHOLECALCIFEROL (VITAMIN D) 2000 UNITS CAPS    Take 1 capsule (2,000 Units total) by mouth daily.   CLINDAMYCIN (CLEOCIN) 300 MG CAPSULE    Take 1 capsule (300 mg total) by mouth 3 (three) times daily. X 10 days   DIGESTIVE ENZYMES TABS    Take by mouth. 1-2 BY MOUTH DAILY   GABAPENTIN (NEURONTIN) 100 MG CAPSULE    Take 100 mg by mouth at bedtime.   HYDROCHLOROTHIAZIDE (MICROZIDE) 12.5 MG CAPSULE    Take 1 capsule (12.5 mg total) by mouth daily.   LEVOTHYROXINE (SYNTHROID, LEVOTHROID) 75 MCG TABLET    1 by mouth daily EXCEPT 1 1/2 on Tue/Thurs AND Sat, recheck labs 10 weeks   LOSARTAN (COZAAR) 50 MG TABLET    1 bid   MULTIPLE VITAMIN (MULTIVITAMIN) CAPSULE    Take 1 capsule by mouth daily.     OMEPRAZOLE (PRILOSEC PO)    Take by mouth.   PRAVASTATIN (PRAVACHOL) 20 MG TABLET    TAKE 1 BY MOUTH DAILY  Modified Medications   No medications on file  Discontinued Medications   No medications on file     Physical Exam:  Filed Vitals:   03/20/15 1334  BP: 149/79  Pulse: 64  Temp: 97.2 F (36.2 C)  Resp: 20  Weight: 122 lb 6.4 oz (55.52 kg)  SpO2: 99%   Wt Readings from Last 3 Encounters:  03/20/15 122 lb 6.4 oz (55.52 kg)  01/26/15 120 lb 4.8 oz (54.568 kg)  02/07/15 120 lb (54.432 kg)    Physical  Exam  Constitutional: She is oriented to person, place, and time and well-developed, well-nourished, and in no distress. No distress.  Neck: No JVD present.  Cardiovascular: Normal rate and regular rhythm.   bilat LE edema +1 with chronic venous stasis changes, no drainage, no wound, no tenderness, no warmth, no red streaking  Pulmonary/Chest: Effort normal and breath sounds normal. No respiratory distress.  Abdominal: Soft. Bowel sounds are normal. She exhibits no distension. There is no tenderness.  Neurological: She is alert and oriented to person, place, and time.  Skin: Skin is warm and dry. She is not diaphoretic.  No open areas to either foot  Psychiatric: Affect normal.     Labs reviewed/Significant Diagnostic Results:  Basic Metabolic Panel:  Recent Labs  08/11/14 2109  12/09/14 1155 01/02/15 1550 01/30/15 1240  NA  --   < > 141 143 141  K  --   < > 3.7 3.8 3.6  CL  --   < > 104 106 106  CO2  --   < > 31 29 29   GLUCOSE  --   < > 85 85 90  BUN  --   < > 29* 36* 21*  CREATININE  --   < > 0.96 1.02* 0.79  CALCIUM  --   < > 9.5 9.0 8.7*  MG 2.0  --   --   --   --   PHOS 2.7  --   --   --   --   < > = values in this interval not displayed. Liver Function Tests:  Recent Labs  08/11/14 1300 12/09/14 1155 01/30/15 1240  AST 33 29 32  ALT 20 22 21   ALKPHOS 34* 35* 32*  BILITOT 1.3* 1.3* 0.8  PROT 6.4 6.8 6.3*  ALBUMIN 3.6 4.1 3.5   No results for input(s): LIPASE, AMYLASE in the last 8760 hours. No results for input(s): AMMONIA in the last 8760 hours. CBC:  Recent Labs  12/09/14 1155 01/02/15 1550 01/30/15 1240  WBC 5.7 6.1 4.5  NEUTROABS 4.1 4.4 3.4  HGB 14.8 13.5 13.5  HCT 44.6 41.5 41.2  MCV 95.7 98.8 98.8  PLT 160.0 155 132*   CBG: No results for input(s): GLUCAP in the last 8760 hours. TSH:  Recent Labs  08/11/14 2109 12/09/14 1155  TSH 2.818 5.44*   A1C: No results found for: HGBA1C Lipid Panel:  Recent Labs  08/12/14 0308  CHOL 199  HDL 87  LDLCALC 104*  TRIG 38  CHOLHDL 2.3   Reviewed left lower ext doppler on 01/30/15 -negative for DVT Reviewed left tib/fib xray from 01/02/15  -negative for acute fracture or osteo    Assessment/Plan  1. Hereditary and idiopathic peripheral neuropathy -Neurontin 100 mg p.o. qhs -may increase if tolerates well  2. Chronic venous insufficiency -stable weight and legs look improved from last visit -continue compression hose and current meds  Cindi Carbon, [place] [phone]

## 2015-04-12 ENCOUNTER — Non-Acute Institutional Stay: Payer: Medicare Other | Admitting: Internal Medicine

## 2015-04-12 ENCOUNTER — Encounter: Payer: Self-pay | Admitting: Internal Medicine

## 2015-04-12 VITALS — BP 110/72 | HR 60 | Temp 97.6°F | Wt 121.0 lb

## 2015-04-12 DIAGNOSIS — I350 Nonrheumatic aortic (valve) stenosis: Secondary | ICD-10-CM | POA: Diagnosis not present

## 2015-04-12 DIAGNOSIS — G609 Hereditary and idiopathic neuropathy, unspecified: Secondary | ICD-10-CM

## 2015-04-12 DIAGNOSIS — N3941 Urge incontinence: Secondary | ICD-10-CM

## 2015-04-12 DIAGNOSIS — G3184 Mild cognitive impairment, so stated: Secondary | ICD-10-CM

## 2015-04-12 DIAGNOSIS — R2681 Unsteadiness on feet: Secondary | ICD-10-CM | POA: Diagnosis not present

## 2015-04-12 DIAGNOSIS — R5383 Other fatigue: Secondary | ICD-10-CM | POA: Diagnosis not present

## 2015-04-12 DIAGNOSIS — I872 Venous insufficiency (chronic) (peripheral): Secondary | ICD-10-CM | POA: Diagnosis not present

## 2015-04-12 NOTE — Progress Notes (Signed)
Patient ID: Natalie Lam, female   DOB: 1931-09-30, 81 y.o.   MRN: [id number]   Location:  [facility]  Code Status: DNR Goals of Care:Advanced Directive information Does patient have an advance directive?: Yes, Type of Advance Directive: [place];Living will;Out of facility DNR (pink MOST or yellow form), Pre-existing out of facility DNR order (yellow form or pink MOST form): Yellow form placed in chart (order not valid for inpatient use)  Chief Complaint  Patient presents with  . Medical Management of Chronic Issues    blood pressure, thyroid, peripheral neuropathy. Here with daughter [name]  . neuropathy pain    in feet and lower legs, comes and goes, getting worse  . incontinence    urine and bowel  . Gait Problem    getting worse, goes around in circles, walks with walker  . Fatigue    getting worse    HPI: Patient is a 81 y.o.  seen in the [facility] today for med mgt of chronic diseases and several ongoing concerns as above.  Pain in feet and legs is getting worse.  She took two tylenols this 5am.  She takes the gabapentin at 8:30pm.  Occasionally has pain, not all of the time.  Moves from feet especially heels into legs.  Was in her three middle toes only the one day.  She massaged them and it helped.  Last night was bad.  Had not needed the tylenol so much before.  Many times in left leg, occasionally in right leg.  Feels like the little bit of gabapentin does help.  Gait also getting worse over time.  She has difficulty getting started walking and goes in circles.  Feet seem to freeze in position.    She gets more fatigued walking to the main floor dining room or to the mailbox.    More difficulty now with holding her urine and bowels.  Knows she has to go but as soon as feet hit the floor, she goes.  Bad urgency.  She wears a pad--using the thicker ones at night.  Has always had daytime urinary frequency even before moving here.    The  staff are now helping her to dress b/c of fatigue and weakness.  They also help her put on her hose and take them off.    The insurance apparently did not authorize for AL level of care and pt's daughter and son-in-law request that I complete paperwork to get that authorized.  Pt was initially overwhelmed trying to go through her things upon moving so she refused her PT at first.  Wanted to put on hold and resume later. Pt also thought she needed to pay if she needed help with bathing and dressing.  Family is paying for her care right now. Now has rails on toilet to help.  Railings had to be lowered so she could reach her clothing.    Review of Systems:  Review of Systems  Constitutional: Positive for malaise/fatigue. Negative for fever and chills.  HENT: Negative for hearing loss.   Eyes:       Glasses  Respiratory: Positive for shortness of breath.   Cardiovascular: Positive for leg swelling. Negative for chest pain.       Chronic venous insufficiency  Gastrointestinal: Negative for abdominal pain.  Genitourinary: Negative for dysuria.  Musculoskeletal: Negative for falls.  Neurological: Positive for sensory change.  Psychiatric/Behavioral: Positive for memory loss. The patient does not have insomnia.  Past Medical History  Diagnosis Date  . Hyperlipidemia   . Hypothyroidism   . Gilbert syndrome   . Rheumatic fever   . Osteoporosis   . Aortic stenosis, mild   . HTN (hypertension)   . Melanoma ([place])   . Neuropathy [facility])     Past Surgical History  Procedure Laterality Date  . Abdominal hysterectomy  1963    no BSO; no abnormal PAPs  . Hemorrhoid surgery  1972  . Colonoscopy  2000    negative; [place]  . Melanoma excision  08/2014    Social History:   reports that she has never smoked. She has never used smokeless tobacco. She reports that she does not drink alcohol or use illicit drugs.  Allergies  Allergen Reactions  . Sulfonamide Derivatives      REACTION:generalized  swelling  . Sulfa Antibiotics Swelling    Medications: Patient's Medications  New Prescriptions   No medications on file  Previous Medications   CALCIUM CARBONATE-VITAMIN D (CALCIUM 600/VITAMIN D PO)    Take by mouth daily.   CHOLECALCIFEROL (VITAMIN D) 2000 UNITS CAPS    Take 1 capsule (2,000 Units total) by mouth daily.   CLINDAMYCIN (CLEOCIN) 300 MG CAPSULE    Take 1 capsule (300 mg total) by mouth 3 (three) times daily. X 10 days   DIGESTIVE ENZYMES TABS    Take by mouth. 1-2 BY MOUTH DAILY   GABAPENTIN (NEURONTIN) 100 MG CAPSULE    Take 100 mg by mouth at bedtime.   HYDROCHLOROTHIAZIDE (MICROZIDE) 12.5 MG CAPSULE    Take 1 capsule (12.5 mg total) by mouth daily.   LEVOTHYROXINE (SYNTHROID, LEVOTHROID) 75 MCG TABLET    1 by mouth daily EXCEPT 1 1/2 on Tue/Thurs AND Sat, recheck labs 10 weeks   LOSARTAN (COZAAR) 50 MG TABLET    1 bid   MULTIPLE VITAMIN (MULTIVITAMIN) CAPSULE    Take 1 capsule by mouth daily.     OMEPRAZOLE (PRILOSEC PO)    Take by mouth.   PRAVASTATIN (PRAVACHOL) 20 MG TABLET    TAKE 1 BY MOUTH DAILY  Modified Medications   No medications on file  Discontinued Medications   No medications on file     Physical Exam: Filed Vitals:   04/12/15 0930  BP: 110/72  Pulse: 60  Temp: 97.6 F (36.4 C)  TempSrc: Oral  Weight: 121 lb (54.885 kg)  SpO2: 96%   Body mass index is 22.87 kg/(m^2). Physical Exam  Constitutional: She appears well-developed and well-nourished. No distress.  Cardiovascular: Normal rate, regular rhythm and intact distal pulses.   Murmur heard. Mild bilateral edema with compression hose in place  Pulmonary/Chest: Effort normal and breath sounds normal. No respiratory distress.  Abdominal: Soft. Bowel sounds are normal.  Musculoskeletal: Normal range of motion.  Gait unsteady, requires walker and has difficulty getting started walking, must rest regularly  Neurological: She is alert. No cranial nerve deficit.    Oriented to person, place, but not precise time; diminished sensation to light touch and proprioception of feet and lower legs  Skin: Skin is warm and dry.  Psychiatric: She has a normal mood and affect.     Labs reviewed: Basic Metabolic Panel:  Recent Labs  08/11/14 2109  12/09/14 1155 01/02/15 1550 01/30/15 1240  NA  --   < > 141 143 141  K  --   < > 3.7 3.8 3.6  CL  --   < > 104 106 106  CO2  --   < >  31 29 29   GLUCOSE  --   < > 85 85 90  BUN  --   < > 29* 36* 21*  CREATININE  --   < > 0.96 1.02* 0.79  CALCIUM  --   < > 9.5 9.0 8.7*  MG 2.0  --   --   --   --   PHOS 2.7  --   --   --   --   TSH 2.818  --  5.44*  --   --   < > = values in this interval not displayed. Liver Function Tests:  Recent Labs  08/11/14 1300 12/09/14 1155 01/30/15 1240  AST 33 29 32  ALT 20 22 21   ALKPHOS 34* 35* 32*  BILITOT 1.3* 1.3* 0.8  PROT 6.4 6.8 6.3*  ALBUMIN 3.6 4.1 3.5   No results for input(s): LIPASE, AMYLASE in the last 8760 hours. No results for input(s): AMMONIA in the last 8760 hours. CBC:  Recent Labs  12/09/14 1155 01/02/15 1550 01/30/15 1240  WBC 5.7 6.1 4.5  NEUTROABS 4.1 4.4 3.4  HGB 14.8 13.5 13.5  HCT 44.6 41.5 41.2  MCV 95.7 98.8 98.8  PLT 160.0 155 132*   Lipid Panel:  Recent Labs  08/12/14 0308  CHOL 199  HDL 87  LDLCALC 104*  TRIG 38  CHOLHDL 2.3   No results found for: HGBA1C   Patient Care Team: Gayland Curry, DO as PCP - General (Geriatric Medicine) Jola Schmidt, MD as Consulting Physician (Ophthalmology) Ludwig Clarks, DO as Consulting Physician (Neurology) Peter M Martinique, MD as Consulting Physician (Cardiology)  Assessment/Plan 1. Hereditary and idiopathic peripheral neuropathy -has been progressive -increase gabapentin to 200mg  po bid for now and see how she does--suspect she will require more of this, but higher doses can affect balance and her age affects her gfr -continue use of walker for stability and help with bathing  and dressing due to fall risk  2. Chronic venous insufficiency -cont compression hose which pt needs assistance donning--they have been tremendously helpful along with low sodium diet  3. Unsteady gait -must use walker due to fall risk and neuropathy -no longer able to drive her car (family has sold it) due to this, neuropathy, cognitive losses  4. Urge urinary incontinence -also some occasional functional fecal incontinence -this seems primarily urge incontinence -recommended use of special underwear rather than pads  -requiring help with baths 3x weekly  5. Aortic valve stenosis -suspect this is progressing and contributing to her increased fatigue with minimal activity--it was mild in march  6. Other fatigue -multifactorial--pt had been trying to do more on her own than she was safely capable of, but is now getting assistance with her bathing, dressing, compression hose, transportation -also #5 may be worsening -will also f/u labs before her next appt:  Cbc, cmp, b12/folate, and TSH  7. Mild cognitive impairment with memory loss -repeats herself during the visit -mild memory loss on her MMSE -f/u labs as above which can affect cognition  Labs/tests ordered:Cbc, cmp, b12/folate, and TSH  Next appt:  3 mos med mgt with labs before  [place] L. Iniya Matzek, D.O. [facility] [address], [place] [address] Cell Phone (Mon-Fri 8am-5pm):  [phone] On Call:  [phone] & follow prompts after 5pm & weekends Office Phone:  [phone] Office Fax:  [phone]

## 2015-04-18 ENCOUNTER — Telehealth: Payer: Self-pay | Admitting: Internal Medicine

## 2015-04-18 DIAGNOSIS — R300 Dysuria: Secondary | ICD-10-CM | POA: Diagnosis not present

## 2015-04-18 DIAGNOSIS — Z029 Encounter for administrative examinations, unspecified: Secondary | ICD-10-CM

## 2015-04-18 NOTE — Telephone Encounter (Signed)
Ezlynn Cobo the daughter & HCPOA for Trista Gadsden called to check the status of the forms that patient gave to you 04/11/17 at her appointment at the [facility] clinic last week. [name] stated that you told them you would complete the paperwork over the weekend and they would be ready. I could not find them in the office so I took her name and phone # and told her we would look into it and call her back. She said she needs the paperwork before next week. I told her that we were closed Thursday & Friday and she said she HAS to have the papers BEFORE next week.     Please advise

## 2015-04-18 NOTE — Telephone Encounter (Signed)
Natalie Lam [phone]

## 2015-04-19 NOTE — Telephone Encounter (Signed)
Called, left message on voice mail, to [facility]. Dr. Mariea Clonts has completed the form, we have it at [facility] today, would she like to pick it up at [facility] or the office, call back.

## 2015-04-19 NOTE — Telephone Encounter (Signed)
Left message on voice mail for Natalie Lam , I gave the form to [name], nurse at [facility], she can pick it up when she brings her mother back at [employer]. She left a message on my voice mail she would pick form up at office on Friday, left message office is closed Thursday and Friday for Thanksgiving. Dr. Mariea Clonts started her on Cipro 500mg  twice daily for 7 days for UTI.

## 2015-04-19 NOTE — Telephone Encounter (Signed)
Noted.  I said I would probably get to it over the weekend.  I apologize that I did not.  I will attempt to complete it today if I have a few minutes of downtime.

## 2015-04-22 DIAGNOSIS — R2681 Unsteadiness on feet: Secondary | ICD-10-CM | POA: Insufficient documentation

## 2015-04-22 DIAGNOSIS — I872 Venous insufficiency (chronic) (peripheral): Secondary | ICD-10-CM | POA: Insufficient documentation

## 2015-04-22 DIAGNOSIS — G609 Hereditary and idiopathic neuropathy, unspecified: Secondary | ICD-10-CM | POA: Insufficient documentation

## 2015-04-22 DIAGNOSIS — N3941 Urge incontinence: Secondary | ICD-10-CM | POA: Insufficient documentation

## 2015-04-22 DIAGNOSIS — R5383 Other fatigue: Secondary | ICD-10-CM | POA: Insufficient documentation

## 2015-04-24 DIAGNOSIS — R2689 Other abnormalities of gait and mobility: Secondary | ICD-10-CM | POA: Diagnosis not present

## 2015-04-24 DIAGNOSIS — G9009 Other idiopathic peripheral autonomic neuropathy: Secondary | ICD-10-CM | POA: Diagnosis not present

## 2015-04-24 DIAGNOSIS — R278 Other lack of coordination: Secondary | ICD-10-CM | POA: Diagnosis not present

## 2015-04-24 DIAGNOSIS — R2681 Unsteadiness on feet: Secondary | ICD-10-CM | POA: Diagnosis not present

## 2015-04-24 DIAGNOSIS — Z9181 History of falling: Secondary | ICD-10-CM | POA: Diagnosis not present

## 2015-04-26 DIAGNOSIS — R278 Other lack of coordination: Secondary | ICD-10-CM | POA: Diagnosis not present

## 2015-04-26 DIAGNOSIS — G9009 Other idiopathic peripheral autonomic neuropathy: Secondary | ICD-10-CM | POA: Diagnosis not present

## 2015-04-26 DIAGNOSIS — R2689 Other abnormalities of gait and mobility: Secondary | ICD-10-CM | POA: Diagnosis not present

## 2015-04-26 DIAGNOSIS — Z9181 History of falling: Secondary | ICD-10-CM | POA: Diagnosis not present

## 2015-04-26 DIAGNOSIS — R2681 Unsteadiness on feet: Secondary | ICD-10-CM | POA: Diagnosis not present

## 2015-05-03 DIAGNOSIS — R278 Other lack of coordination: Secondary | ICD-10-CM | POA: Diagnosis not present

## 2015-05-03 DIAGNOSIS — R2689 Other abnormalities of gait and mobility: Secondary | ICD-10-CM | POA: Diagnosis not present

## 2015-05-03 DIAGNOSIS — G9009 Other idiopathic peripheral autonomic neuropathy: Secondary | ICD-10-CM | POA: Diagnosis not present

## 2015-05-03 DIAGNOSIS — R2681 Unsteadiness on feet: Secondary | ICD-10-CM | POA: Diagnosis not present

## 2015-05-03 DIAGNOSIS — Z9181 History of falling: Secondary | ICD-10-CM | POA: Diagnosis not present

## 2015-05-05 ENCOUNTER — Non-Acute Institutional Stay: Payer: Medicare Other | Admitting: Adult Health

## 2015-05-05 DIAGNOSIS — I872 Venous insufficiency (chronic) (peripheral): Secondary | ICD-10-CM

## 2015-05-08 ENCOUNTER — Encounter: Payer: Self-pay | Admitting: Adult Health

## 2015-05-08 NOTE — Progress Notes (Addendum)
Patient ID: Natalie Lam, female   DOB: 1932/05/21, 81 y.o.   MRN: [id number]     Patient Care Team: Gayland Curry, DO as PCP - General (Geriatric Medicine) Jola Schmidt, MD as Consulting Physician (Ophthalmology) Ludwig Clarks, DO as Consulting Physician (Neurology) Peter M Martinique, MD as Consulting Physician (Cardiology)  Nursing Home Location:  [place] of Service: Assisted living  Chief Complaint  Patient presents with  . Acute Visit    edema    HPI:  81 y.o.female,  residing at [employer], [place] section. She has a hx of chronic venous insuff, HTN, AS, Hypothyroidism, Gilbert syndrome, bilateral carotid bruits, peripheral neuropathy, and OP. I was asked to see her today by the staff for increased swelling in her feet. She is currently on HCTZ 12.5 qd and thigh high compression hose. She has no chest pain or sob. Her echo March revealed mild AS and EF of 65%. She reports that the swelling has improved since she used her compression stocking more often. Her daughter is concerned that the edema is getting worse.  Dr. Mariea Clonts previously prescribed lasix and the staff is requesting this again.     Review of Systems:  Review of Systems  Constitutional: Negative for fever, diaphoresis, activity change and appetite change.  Respiratory: Negative for cough and shortness of breath.   Cardiovascular: Positive for leg swelling. Negative for chest pain and palpitations.       Has chronic venous stasis changes  Gastrointestinal: Negative for abdominal pain and abdominal distention.  Skin: Negative for rash and wound.  Neurological: Positive for numbness. Negative for dizziness, syncope and light-headedness.       Burning painful feet, heel, toes  Psychiatric/Behavioral: Negative for confusion and agitation.    Medications: Patient's Medications  New Prescriptions   No medications on file  Previous Medications   ACETAMINOPHEN (TYLENOL)  325 MG TABLET    Take 650 mg by mouth. Take 2 every 6 hours as needed for pain   BISACODYL (BISACODYL) 5 MG EC TABLET    Take 5 mg by mouth daily as needed for moderate constipation.   CALCIUM CARBONATE-VITAMIN D (CALCIUM 600/VITAMIN D PO)    Take by mouth daily.   CHOLECALCIFEROL (VITAMIN D) 2000 UNITS CAPS    Take 1 capsule (2,000 Units total) by mouth daily.   CLINDAMYCIN (CLEOCIN) 300 MG CAPSULE    Take 1 capsule (300 mg total) by mouth 3 (three) times daily. X 10 days   DIGESTIVE ENZYMES TABS    Take by mouth. 1-2 BY MOUTH DAILY   GABAPENTIN (NEURONTIN) 100 MG CAPSULE    Take 100 mg by mouth at bedtime.   HYDROCHLOROTHIAZIDE (MICROZIDE) 12.5 MG CAPSULE    Take 1 capsule (12.5 mg total) by mouth daily.   LEVOTHYROXINE (SYNTHROID, LEVOTHROID) 75 MCG TABLET    1 by mouth daily EXCEPT 1 1/2 on Tue/Thurs AND Sat, recheck labs 10 weeks   LOSARTAN (COZAAR) 50 MG TABLET    1 bid   MULTIPLE VITAMIN (MULTIVITAMIN) CAPSULE    Take 1 capsule by mouth daily.     OMEPRAZOLE (PRILOSEC PO)    Take by mouth.   PRAVASTATIN (PRAVACHOL) 20 MG TABLET    TAKE 1 BY MOUTH DAILY  Modified Medications   No medications on file  Discontinued Medications   No medications on file     Physical Exam:  Filed Vitals:   05/05/15 1105  BP: 135/70  Pulse: 61  Temp: 97.8 F (36.6 C)  Resp: 20   Wt Readings from Last 3 Encounters:  04/12/15 121 lb (54.885 kg)  03/20/15 122 lb 6.4 oz (55.52 kg)  01/26/15 120 lb 4.8 oz (54.568 kg)    Physical Exam  Constitutional: She is oriented to person, place, and time and well-developed, well-nourished, and in no distress. No distress.  Neck: No JVD present.  Cardiovascular: Normal rate and regular rhythm.   bilat LE edema +1 with chronic venous stasis changes, no drainage, no wound, no tenderness, no warmth, no red streaking  Pulmonary/Chest: Effort normal and breath sounds normal. No respiratory distress.  Abdominal: Soft. Bowel sounds are normal. She exhibits no  distension. There is no tenderness.  Neurological: She is alert and oriented to person, place, and time.  Skin: Skin is warm and dry. She is not diaphoretic.  No open areas to either foot  Psychiatric: Affect normal.    Labs reviewed/Significant Diagnostic Results:  Basic Metabolic Panel:  Recent Labs  08/11/14 2109  12/09/14 1155 01/02/15 1550 01/30/15 1240  NA  --   < > 141 143 141  K  --   < > 3.7 3.8 3.6  CL  --   < > 104 106 106  CO2  --   < > 31 29 29   GLUCOSE  --   < > 85 85 90  BUN  --   < > 29* 36* 21*  CREATININE  --   < > 0.96 1.02* 0.79  CALCIUM  --   < > 9.5 9.0 8.7*  MG 2.0  --   --   --   --   PHOS 2.7  --   --   --   --   < > = values in this interval not displayed. Liver Function Tests:  Recent Labs  08/11/14 1300 12/09/14 1155 01/30/15 1240  AST 33 29 32  ALT 20 22 21   ALKPHOS 34* 35* 32*  BILITOT 1.3* 1.3* 0.8  PROT 6.4 6.8 6.3*  ALBUMIN 3.6 4.1 3.5   No results for input(s): LIPASE, AMYLASE in the last 8760 hours. No results for input(s): AMMONIA in the last 8760 hours. CBC:  Recent Labs  12/09/14 1155 01/02/15 1550 01/30/15 1240  WBC 5.7 6.1 4.5  NEUTROABS 4.1 4.4 3.4  HGB 14.8 13.5 13.5  HCT 44.6 41.5 41.2  MCV 95.7 98.8 98.8  PLT 160.0 155 132*   CBG: No results for input(s): GLUCAP in the last 8760 hours. TSH:  Recent Labs  08/11/14 2109 12/09/14 1155  TSH 2.818 5.44*   A1C: No results found for: HGBA1C Lipid Panel:  Recent Labs  08/12/14 0308  CHOL 199  HDL 87  LDLCALC 104*  TRIG 38  CHOLHDL 2.3   Reviewed left lower ext doppler on 01/30/15 -negative for DVT Reviewed left tib/fib xray from 01/02/15  -negative for acute fracture or osteo   Assessment/Plan  1) Chronic venous insufficiency -continue compression stockings -lasix 20 mg qd with Kdur 10 meq qd for 3 days -f/u BMP in 1 week -increase hctz to 25 mg qd once lasix completed to help with edema -avoid daily lasix with hctz at this point to avoid  dehydration and risk of falls   Cindi Carbon, [place] [phone]

## 2015-05-10 DIAGNOSIS — R2689 Other abnormalities of gait and mobility: Secondary | ICD-10-CM | POA: Diagnosis not present

## 2015-05-10 DIAGNOSIS — Z9181 History of falling: Secondary | ICD-10-CM | POA: Diagnosis not present

## 2015-05-10 DIAGNOSIS — R2681 Unsteadiness on feet: Secondary | ICD-10-CM | POA: Diagnosis not present

## 2015-05-10 DIAGNOSIS — R278 Other lack of coordination: Secondary | ICD-10-CM | POA: Diagnosis not present

## 2015-05-10 DIAGNOSIS — G9009 Other idiopathic peripheral autonomic neuropathy: Secondary | ICD-10-CM | POA: Diagnosis not present

## 2015-05-11 DIAGNOSIS — R5382 Chronic fatigue, unspecified: Secondary | ICD-10-CM | POA: Diagnosis not present

## 2015-05-17 DIAGNOSIS — G9009 Other idiopathic peripheral autonomic neuropathy: Secondary | ICD-10-CM | POA: Diagnosis not present

## 2015-05-17 DIAGNOSIS — R2681 Unsteadiness on feet: Secondary | ICD-10-CM | POA: Diagnosis not present

## 2015-05-17 DIAGNOSIS — Z9181 History of falling: Secondary | ICD-10-CM | POA: Diagnosis not present

## 2015-05-17 DIAGNOSIS — R2689 Other abnormalities of gait and mobility: Secondary | ICD-10-CM | POA: Diagnosis not present

## 2015-05-17 DIAGNOSIS — R278 Other lack of coordination: Secondary | ICD-10-CM | POA: Diagnosis not present

## 2015-06-01 DIAGNOSIS — R5382 Chronic fatigue, unspecified: Secondary | ICD-10-CM | POA: Diagnosis not present

## 2015-07-11 DIAGNOSIS — D509 Iron deficiency anemia, unspecified: Secondary | ICD-10-CM | POA: Diagnosis not present

## 2015-07-11 DIAGNOSIS — R5382 Chronic fatigue, unspecified: Secondary | ICD-10-CM | POA: Diagnosis not present

## 2015-07-11 DIAGNOSIS — D508 Other iron deficiency anemias: Secondary | ICD-10-CM | POA: Diagnosis not present

## 2015-07-11 DIAGNOSIS — R799 Abnormal finding of blood chemistry, unspecified: Secondary | ICD-10-CM | POA: Diagnosis not present

## 2015-07-11 DIAGNOSIS — E039 Hypothyroidism, unspecified: Secondary | ICD-10-CM | POA: Diagnosis not present

## 2015-07-11 LAB — HEPATIC FUNCTION PANEL
ALK PHOS: 31 U/L (ref 25–125)
ALT: 17 U/L (ref 7–35)
AST: 23 U/L (ref 13–35)
Bilirubin, Total: 1 mg/dL

## 2015-07-11 LAB — BASIC METABOLIC PANEL
BUN: 24 mg/dL — AB (ref 4–21)
Creatinine: 0.9 mg/dL (ref 0.5–1.1)
GLUCOSE: 84 mg/dL
Potassium: 3.9 mmol/L (ref 3.4–5.3)
Sodium: 145 mmol/L (ref 137–147)

## 2015-07-11 LAB — TSH: TSH: 0.11 u[IU]/mL — AB (ref 0.41–5.90)

## 2015-07-11 LAB — CBC AND DIFFERENTIAL
HCT: 42 % (ref 36–46)
HEMOGLOBIN: 14.2 g/dL (ref 12.0–16.0)
PLATELETS: 162 10*3/uL (ref 150–399)
WBC: 4.5 10^3/mL

## 2015-07-12 ENCOUNTER — Non-Acute Institutional Stay: Payer: Medicare Other | Admitting: Internal Medicine

## 2015-07-12 ENCOUNTER — Encounter: Payer: Self-pay | Admitting: Internal Medicine

## 2015-07-12 VITALS — BP 152/82 | HR 60 | Temp 98.0°F | Ht 61.0 in | Wt 124.0 lb

## 2015-07-12 DIAGNOSIS — R2681 Unsteadiness on feet: Secondary | ICD-10-CM | POA: Diagnosis not present

## 2015-07-12 DIAGNOSIS — E039 Hypothyroidism, unspecified: Secondary | ICD-10-CM | POA: Diagnosis not present

## 2015-07-12 DIAGNOSIS — G3184 Mild cognitive impairment, so stated: Secondary | ICD-10-CM | POA: Diagnosis not present

## 2015-07-12 DIAGNOSIS — G609 Hereditary and idiopathic neuropathy, unspecified: Secondary | ICD-10-CM

## 2015-07-12 DIAGNOSIS — I872 Venous insufficiency (chronic) (peripheral): Secondary | ICD-10-CM

## 2015-07-12 NOTE — Progress Notes (Signed)
Patient ID: Natalie Lam, female   DOB: 1931/12/25, 82 y.o.   MRN: [id number]    Location: [facility] Provider: Jaycion Treml L. Mariea Clonts, D.O., C.M.D.  Code Status: DNR Goals of Care: Advanced Directive information Does patient have an advance directive?: Yes, Type of Advance Directive: [place];Living will;Out of facility DNR (pink MOST or yellow form), Pre-existing out of facility DNR order (yellow form or pink MOST form): Yellow form placed in chart (order not valid for inpatient use)  Chief Complaint  Patient presents with  . Medical Management of Chronic Issues    medication management blood pressure, thyroid, peripheral neuropathy    HPI: Patient is a 82 y.o. female seen in the office today for med mgt of chronic diseases.  Still not walking on her own.  Has a fear of falling.  Pushes her rollator walker.    Says she forgot her appt.    Neuropathy:  Mostly painful in heels or toes.  Sometimes three middle toes in left foot.  Thinks she might be putting more pressure on those areas.  Occasionally asks for tylenol.  Doesn't take it unless she really needs it.  Neurontin is helping her quite a bit.    BP elevated today.  I reviewed her bps from AL and most are wnl.  Had to hurry this am b/c forgot appt.    Chronic venous insufficiency:  Edema doing well.  Wears her compression hose.  She had been overdoing bacon and sausage and has quit these.  Likes the water therapy for her legs.  Elevates her feet on her walker.  Sleeps in recliner.  Forgot her glasses and squinting this am.  Days vision far is better, but close is worse.  Had her melanoma removed from her left arm and skin cancer on her nose also.  Sees Dr. Jefm Miles at [facility].  Not all of the cancer was removed, she reports.    Reports that she was told never to take vaccines.  She did have her flu shot last year though.  Need to check with her daughter, [name].  Hypothyroidism:  TSH was low so reduced  synthroid to 65mcg from 11mcg.    Review of Systems:  Review of Systems  Constitutional: Negative for fever and chills.  HENT: Negative for congestion.   Eyes: Negative for blurred vision.       Glasses  Respiratory: Negative for shortness of breath.   Cardiovascular: Positive for leg swelling. Negative for chest pain.  Gastrointestinal: Negative for constipation.  Genitourinary: Negative for dysuria.  Musculoskeletal: Negative for falls.       Osteoporosis  Skin: Negative for rash.       Prior left arm melanoma and skin cancer on nose  Neurological: Positive for tingling and sensory change. Negative for loss of consciousness.  Endo/Heme/Allergies: Bruises/bleeds easily.  Psychiatric/Behavioral: Positive for memory loss.    Past Medical History  Diagnosis Date  . Hyperlipidemia   . Hypothyroidism   . Gilbert syndrome   . Rheumatic fever     does not take Flu shot   . Osteoporosis   . Aortic stenosis, mild   . HTN (hypertension)   . Melanoma ([place])   . Neuropathy [facility])     Past Surgical History  Procedure Laterality Date  . Abdominal hysterectomy  1963    no BSO; no abnormal PAPs  . Hemorrhoid surgery  1972  . Colonoscopy  2000    negative; [place]  . Melanoma excision  08/2014  Allergies  Allergen Reactions  . Sulfonamide Derivatives     REACTION:generalized  swelling  . Sulfa Antibiotics Swelling      Medication List       This list is accurate as of: 07/12/15 10:17 AM.  Always use your most recent med list.               acetaminophen 325 MG tablet  Commonly known as:  TYLENOL  Take 650 mg by mouth. Take 2 every 6 hours as needed for pain     bisacodyl 5 MG EC tablet  Generic drug:  bisacodyl  Take 5 mg by mouth daily as needed for moderate constipation.     CALCIUM 600/VITAMIN D PO  Take by mouth daily.     Digestive Enzymes Tabs  Take by mouth. Take 2 as needed for indigestion     gabapentin 100 MG capsule  Commonly known as:  NEURONTIN    Take 100 mg by mouth. Take 2 tablets =200 mg twice daily     hydrochlorothiazide 25 MG tablet  Commonly known as:  HYDRODIURIL  Take 25 mg by mouth daily.     levothyroxine 50 MCG tablet  Commonly known as:  SYNTHROID, LEVOTHROID  Take 50 mcg by mouth daily before breakfast.     losartan 50 MG tablet  Commonly known as:  COZAAR  1 bid     multivitamin capsule  Take 1 capsule by mouth daily.     pravastatin 20 MG tablet  Commonly known as:  PRAVACHOL  TAKE 1 BY MOUTH DAILY     PRILOSEC PO  Take by mouth.     Vitamin D 2000 units Caps  Take 1 capsule (2,000 Units total) by mouth daily.        Health Maintenance  Topic Date Due  . ZOSTAVAX  12/27/1993  . PNA vac Low Risk Adult (1 of 2 - PCV13) 12/28/1998  . INFLUENZA VACCINE  12/26/2015  . TETANUS/TDAP  08/10/2024  . DEXA SCAN  Completed    Physical Exam: Filed Vitals:   07/12/15 0954  BP: 152/82  Pulse: 60  Temp: 98 F (36.7 C)  TempSrc: Oral  Height: 5\' 1"  (1.549 m)  Weight: 124 lb (56.246 kg)  SpO2: 91%   Body mass index is 23.44 kg/(m^2). Physical Exam  Constitutional: She is oriented to person, place, and time. She appears well-developed and well-nourished. No distress.  Cardiovascular: Normal rate and regular rhythm.   Murmur heard. In aortic area; very mild peripheral edema wearing compression hose  Pulmonary/Chest: Effort normal and breath sounds normal. No respiratory distress.  Abdominal: Soft. Bowel sounds are normal.  Musculoskeletal:  Dowager's hump present/kyphosis, shuffling gait with rollator walker  Neurological: She is alert and oriented to person, place, and time. No cranial nerve deficit.  Skin: Skin is warm and dry.  Psychiatric: She has a normal mood and affect. Her behavior is normal.    Labs reviewed: Basic Metabolic Panel:  Recent Labs  08/11/14 2109  12/09/14 1155 01/02/15 1550 01/30/15 1240 07/11/15  NA  --   < > 141 143 141 145  K  --   < > 3.7 3.8 3.6 3.9  CL  --    < > 104 106 106  --   CO2  --   < > 31 29 29   --   GLUCOSE  --   < > 85 85 90  --   BUN  --   < > 29* 36* 21*  24*  CREATININE  --   < > 0.96 1.02* 0.79 0.9  CALCIUM  --   < > 9.5 9.0 8.7*  --   MG 2.0  --   --   --   --   --   PHOS 2.7  --   --   --   --   --   TSH 2.818  --  5.44*  --   --  0.11*  < > = values in this interval not displayed. Liver Function Tests:  Recent Labs  08/11/14 1300 12/09/14 1155 01/30/15 1240 07/11/15  AST 33 29 32 23  ALT 20 22 21 17   ALKPHOS 34* 35* 32* 31  BILITOT 1.3* 1.3* 0.8  --   PROT 6.4 6.8 6.3*  --   ALBUMIN 3.6 4.1 3.5  --    No results for input(s): LIPASE, AMYLASE in the last 8760 hours. No results for input(s): AMMONIA in the last 8760 hours. CBC:  Recent Labs  12/09/14 1155 01/02/15 1550 01/30/15 1240 07/11/15  WBC 5.7 6.1 4.5 4.5  NEUTROABS 4.1 4.4 3.4  --   HGB 14.8 13.5 13.5 14.2  HCT 44.6 41.5 41.2 42  MCV 95.7 98.8 98.8  --   PLT 160.0 155 132* 162   Lipid Panel:  Recent Labs  08/12/14 0308  CHOL 199  HDL 87  LDLCALC 104*  TRIG 38  CHOLHDL 2.3    Assessment/Plan 1. Hereditary and idiopathic peripheral neuropathy -cont gabapentin with great benefit and occasional prn tylenol  -well controlled per pt -cont use of rollator walker  2. Chronic venous insufficiency -doing well with compression hose use, elevating feet at rest, avoiding high sodium foods  3. Unsteady gait -cont rollator walker use, water aerobics and walking regularly for strength  4. Mild cognitive impairment with memory loss -cont to monitor cognition and need for assistance -reports she is doing her taxes--suspect family helping with this, I hope, given her memory loss  5. Hypothyroidism, unspecified hypothyroidism type -decreased synthroid yesterday due to low tsh -h/o thyroid nodule -f/u tsh in 6 wks  Labs/tests ordered:  F/u tsh in 6 wks Next appt:  3 mos med mgt; needs physical in July  Mekala Winger L. Yuvan Medinger,  D.O. [facility] [address], [place] [address] Cell Phone (Mon-Fri 8am-5pm):  [phone] On Call:  [phone] & follow prompts after 5pm & weekends Office Phone:  [phone] Office Fax:  [phone]

## 2015-07-13 DIAGNOSIS — E039 Hypothyroidism, unspecified: Secondary | ICD-10-CM | POA: Diagnosis not present

## 2015-08-02 DIAGNOSIS — D485 Neoplasm of uncertain behavior of skin: Secondary | ICD-10-CM | POA: Diagnosis not present

## 2015-08-02 DIAGNOSIS — L814 Other melanin hyperpigmentation: Secondary | ICD-10-CM | POA: Diagnosis not present

## 2015-08-02 DIAGNOSIS — Z483 Aftercare following surgery for neoplasm: Secondary | ICD-10-CM | POA: Diagnosis not present

## 2015-08-02 DIAGNOSIS — Z85820 Personal history of malignant melanoma of skin: Secondary | ICD-10-CM | POA: Diagnosis not present

## 2015-08-02 DIAGNOSIS — C4362 Malignant melanoma of left upper limb, including shoulder: Secondary | ICD-10-CM | POA: Diagnosis not present

## 2015-08-02 DIAGNOSIS — Z85828 Personal history of other malignant neoplasm of skin: Secondary | ICD-10-CM | POA: Diagnosis not present

## 2015-08-03 LAB — CBC AND DIFFERENTIAL
HCT: 37 % (ref 36–46)
Hemoglobin: 12.4 g/dL (ref 12.0–16.0)
Platelets: 238 10*3/uL (ref 150–399)
WBC: 6.8 10^3/mL

## 2015-08-03 LAB — BASIC METABOLIC PANEL
BUN: 13 mg/dL (ref 4–21)
Creatinine: 0.9 mg/dL (ref 0.5–1.1)
Glucose: 81 mg/dL
Potassium: 4.6 mmol/L (ref 3.4–5.3)
Sodium: 137 mmol/L (ref 137–147)

## 2015-08-22 DIAGNOSIS — E039 Hypothyroidism, unspecified: Secondary | ICD-10-CM | POA: Diagnosis not present

## 2015-08-22 LAB — TSH: TSH: 5.36 u[IU]/mL (ref <=5.90)

## 2015-09-12 ENCOUNTER — Telehealth: Payer: Self-pay | Admitting: Neurology

## 2015-09-13 ENCOUNTER — Encounter: Payer: Self-pay | Admitting: Neurology

## 2015-09-13 ENCOUNTER — Ambulatory Visit (INDEPENDENT_AMBULATORY_CARE_PROVIDER_SITE_OTHER): Payer: Medicare Other | Admitting: Neurology

## 2015-09-13 VITALS — BP 110/78 | HR 60 | Ht 62.0 in | Wt 126.0 lb

## 2015-09-13 DIAGNOSIS — G20A1 Parkinson's disease without dyskinesia, without mention of fluctuations: Secondary | ICD-10-CM | POA: Insufficient documentation

## 2015-09-13 DIAGNOSIS — G20 Parkinson's disease: Secondary | ICD-10-CM | POA: Diagnosis not present

## 2015-09-13 NOTE — Progress Notes (Signed)
Natalie Lam was seen today in the movement disorders clinic for neurologic consultation at the request of [name], TIFFANY, DO.  The patient is accompanied by her daughter who supplements the history.  Prior records made available to me were reviewed.  The patient has a history of gait instability.  Pts sx's started in March, 2016.  Pt thinks that it started quickly.  She remembers one particular day in March that she went to [facility] and she backed into the space and she realized that she tried to walk into [employer] and just felt that she would fall if she didn't get back in the car.  She had a near syncopal episode which was later determined due to HTN and mild AS.  After that, she seemed to go slowly down hill.  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  She had PT in May which helped her back but didn't really seem to help the gait.  Pt is getting ready to move to [facility] assisted [facility].  Pt states that she just has a "fear of falling because so many people my age do."  Admits that depression contributes to sx's but states that doing better than she was.  Pt denies lateralizing weakness/paresthesias.  Denies speech change but states that she "tires easily."  Denies feet paresthesias.      Specific Symptoms:  Tremor: No. Voice: denies hypophonic speech Sleep: sleeps well  Vivid Dreams:  Yes.   (only if eats at night)  Acting out dreams:  No. Wet Pillows: No. Postural symptoms:  Yes.   (no cane/walker to ambulate)  Falls?  No. Bradykinesia symptoms: slow movements Loss of smell:  No. Loss of taste:  No. Urinary Incontinence:  No. Difficulty Swallowing:  No. Handwriting, micrographia: No. (perhaps a little smaller) Trouble with ADL's:  No.  Trouble buttoning clothing: No. Depression:  Yes.   (multiple deaths in family) Memory changes:  Yes.   (but complicated by  deaths, moving, closing family business; still drives in familiar territory and parks far away to avoid other cars; pays monthly bills without issue; cooks for self until moves to [facility]) Hallucinations:  No.  visual distortions: No. N/V:  No. Lightheaded:  No.   Neuroimaging has previously been performed.  It is  available for my review today.  A CT of the brain was performed on 08/12/2014 after near syncopal episode.  I reviewed this.  There is atrophy and rather significant cervical small vessel disease.  A carotid ultrasound was performed on 09/06/2014.  This demonstrated 1-49% stenosis bilaterally.  09/13/15 update:  The patient is following up today, accompanied by her daughter who supplements the history.  I have not seen her since July, 2016 and that was only a one time visit.  She does have a history of peripheral neuropathy, which has led to gait instability.  Her peripheral neuropathy has also caused paresthesias in the feet and according to records she was started on gabapentin by the nurse practitioner at [facility] in October, 2016.  She states that she is f/u due to trouble walking for about 6 weeks.  Her daughter is a Marine scientist and agrees that the majority of the issue started about 6 weeks ago, with no new med changes.  Pt states that she is getting stuck in doorways.  "Once I get going, I am okay."   ALLERGIES:   Allergies  Allergen Reactions  . Sulfonamide Derivatives     REACTION:generalized  swelling  . Sulfa Antibiotics Swelling    CURRENT MEDICATIONS:  Outpatient Encounter Prescriptions as of 09/13/2015  Medication Sig  . acetaminophen (TYLENOL) 325 MG tablet Take 650 mg by mouth. Take 2 every 6 hours as needed for pain  . bisacodyl (BISACODYL) 5 MG EC tablet Take 5 mg by mouth daily as needed for moderate constipation.  . Calcium Carbonate-Vitamin D (CALCIUM 600/VITAMIN D PO) Take by mouth daily.  . Cholecalciferol (VITAMIN D) 2000 UNITS CAPS Take 1 capsule (2,000 Units  total) by mouth daily.  . Digestive Enzymes TABS Take by mouth. Take 2 as needed for indigestion  . gabapentin (NEURONTIN) 100 MG capsule Take 100 mg by mouth. Take 2 tablets =200 mg twice daily  . hydrochlorothiazide (HYDRODIURIL) 25 MG tablet Take 25 mg by mouth daily.  Marland Kitchen levothyroxine (SYNTHROID, LEVOTHROID) 50 MCG tablet Take one on Sunday, Monday, Wednesday, and Friday  . levothyroxine (SYNTHROID, LEVOTHROID) 75 MCG tablet Take one on Tuesday, Thursday, and Saturday.  . losartan (COZAAR) 50 MG tablet 1 bid  . Multiple Vitamin (MULTIVITAMIN) capsule Take 1 capsule by mouth daily.    . Omeprazole (PRILOSEC PO) Take by mouth.  . pravastatin (PRAVACHOL) 20 MG tablet TAKE 1 BY MOUTH DAILY  . Scar Treatment Products ([place] SPF 30 EX) Apply topically.   No facility-administered encounter medications on file as of 09/13/2015.    PAST MEDICAL HISTORY:   Past Medical History  Diagnosis Date  . Hyperlipidemia   . Hypothyroidism   . Gilbert syndrome   . Rheumatic fever     does not take Flu shot   . Osteoporosis   . Aortic stenosis, mild   . HTN (hypertension)   . Melanoma ([place])   . Neuropathy ([place])     PAST SURGICAL HISTORY:   Past Surgical History  Procedure Laterality Date  . Abdominal hysterectomy  1963    no BSO; no abnormal PAPs  . Hemorrhoid surgery  1972  . Colonoscopy  2000    negative; [place]  . Melanoma excision  08/2014    SOCIAL HISTORY:   Social History   Social History  . Marital Status: Widowed    Spouse Name: N/A  . Number of Children: N/A  . Years of Education: N/A   Occupational History  . retired own [employer]  . Smoking status: Never Smoker   . Smokeless tobacco: Never Used  . Alcohol Use: No  . Drug Use: No  . Sexual Activity: Not on file   Other Topics Concern  . Not on file   Social History Narrative   Lives at [place] [facility] 01/05/2015   Widowed   Never smoked   Exercise walking    Alcohol none   POA/Living Will          FAMILY HISTORY:   Family Status  Relation Status Death Age  . Mother Deceased     alzheimers, stroke  . Son Deceased     multiple myeloma  . Son Deceased     pancreatic cancer  . Father Deceased     unknown  . Brother Alive   . Brother Deceased   . Sister Alive   . Sister Alive   . Daughter Alive   . Daughter Deceased     decapitated in MVA    ROS:  Some LE edema.  A complete 10 system review of  systems was obtained and was unremarkable apart from what is mentioned above.  PHYSICAL EXAMINATION:    VITALS:   Filed Vitals:   09/13/15 1016  BP: 110/78  Pulse: 60  Height: _0  (1.575 m)  Weight: 126 lb (57.153 kg)    GEN:  The patient appears stated age and is in NAD. HEENT:  Normocephalic, atraumatic.  The mucous membranes are moist. The superficial temporal arteries are without ropiness or tenderness. CV:  RRR with 3/6 SEM Lungs:  CTAB Neck/HEME:  There are no carotid bruits bilaterally.  Neurological examination:  Orientation: Had trouble with trail making.  Did well with orientation. Montreal Cognitive Assessment  12/13/2014  Visuospatial/ Executive (0/5) 3  Naming (0/3) 2  Attention: Read list of digits (0/2) 2  Attention: Read list of letters (0/1) 1  Attention: Serial 7 subtraction starting at 100 (0/3) 1  Language: Repeat phrase (0/2) 2  Language : Fluency (0/1) 1  Abstraction (0/2) 2  Delayed Recall (0/5) 0  Orientation (0/6) 6  Total 20  Adjusted Score (based on education) 21    Cranial nerves: There is good facial symmetry.  Extraocular muscles are intact. The visual fields are full to confrontational testing. The speech is fluent and clear. Soft palate rises symmetrically and there is no tongue deviation. Hearing is intact to conversational tone. Sensation: Sensation is intact to light touch throughout Motor: Strength is 5/5 in the bilateral upper and lower extremities.   Shoulder shrug is equal and  symmetric.  There is no pronator drift.  Movement examination: Tone: There is mod increased tone in the LUE and mild increased on the RUE Abnormal movements: None Coordination:  There is decremation with all forms of RAMs, with any form of RAMS, including alternating supination and pronation of the forearm, hand opening and closing, finger taps, heel taps and toe taps. Gait and Station: The patient makes 2 attempts to get out of the chair, and then she is able to arise without the use of her hands.  She has start hesitation.  The left foot sticks to ground.  She does shuffle.  She turns en bloc.  Labs:  Lab Results  Component Value Date   [id number] 08/11/2014   No results found for: HGBA1C    ASSESSMENT/PLAN:  1.  Parkinson's disease  -When I saw her last July, she definitely did not meet the criteria for idiopathic Parkinson's disease, but certainly meets the criteria today.   -We discussed the diagnosis as well as pathophysiology of the disease.  We discussed treatment options as well as prognostic indicators.  Patient education was provided.  -Greater than 50% of the 45 minute visit was spent in counseling answering questions and talking about what to expect now as well as in the future.  We talked about safety in the home.  -We decided to add carbidopa/levodopa 25/100.  1/2 tab tid x 1 wk, then 1/2 in am & noon & 1 at night for a week, then 1/2 in am &1 at noon &night for a week, then 1 po tid.  Risks, benefits, side effects and alternative therapies were discussed.  The opportunity to ask questions was given and they were answered to the best of my ability.  The patient expressed understanding and willingness to follow the outlined treatment protocols.  -I will refer the patient for PT/OT at [facility].  We talked about the importance of safe, cardiovascular exercise in Parkinson's disease.  -We discussed community resources in the area including  patient support groups and  community exercise programs for PD and pt education was provided to the patient. 2.  F/u 8 weeks.  May need to increase carbidopa/levodopa given that she gets up so early and her day is really long but want to make sure that she has no s/e first.

## 2015-09-13 NOTE — Patient Instructions (Addendum)
1. Start Carbidopa Levodopa as follows: 1/2 tab three times a day before meals x 1 wk, then 1/2 in am & noon & 1 in evening for a week, then 1/2 in am &1 at noon &one in evening for a week, then 1 tablet three times a day before meals. 2. Physical and Occupational therapy order written for [facility].

## 2015-09-15 ENCOUNTER — Telehealth: Payer: Self-pay | Admitting: Neurology

## 2015-09-15 NOTE — Telephone Encounter (Signed)
Christey Gromer 08/08/1931. [facility] called needing to have progress notes faxed to them at [phone]. Thank you

## 2015-09-15 NOTE — Telephone Encounter (Signed)
Last office note faxed to [facility] at the number provided.

## 2015-09-19 DIAGNOSIS — R2681 Unsteadiness on feet: Secondary | ICD-10-CM | POA: Diagnosis not present

## 2015-09-19 DIAGNOSIS — R278 Other lack of coordination: Secondary | ICD-10-CM | POA: Diagnosis not present

## 2015-09-19 DIAGNOSIS — G20 Parkinson's disease: Secondary | ICD-10-CM | POA: Diagnosis not present

## 2015-09-19 DIAGNOSIS — R2689 Other abnormalities of gait and mobility: Secondary | ICD-10-CM | POA: Diagnosis not present

## 2015-09-20 DIAGNOSIS — G20 Parkinson's disease: Secondary | ICD-10-CM | POA: Diagnosis not present

## 2015-09-20 DIAGNOSIS — R278 Other lack of coordination: Secondary | ICD-10-CM | POA: Diagnosis not present

## 2015-09-26 ENCOUNTER — Encounter: Payer: Self-pay | Admitting: Neurology

## 2015-09-27 DIAGNOSIS — H5203 Hypermetropia, bilateral: Secondary | ICD-10-CM | POA: Diagnosis not present

## 2015-09-27 DIAGNOSIS — H2513 Age-related nuclear cataract, bilateral: Secondary | ICD-10-CM | POA: Diagnosis not present

## 2015-10-03 DIAGNOSIS — E039 Hypothyroidism, unspecified: Secondary | ICD-10-CM | POA: Diagnosis not present

## 2015-10-03 LAB — TSH: TSH: 2.77 u[IU]/mL (ref <=5.90)

## 2015-10-09 NOTE — Telephone Encounter (Signed)
Closed

## 2015-10-11 ENCOUNTER — Non-Acute Institutional Stay: Payer: Medicare Other | Admitting: Internal Medicine

## 2015-10-11 ENCOUNTER — Encounter: Payer: Self-pay | Admitting: Internal Medicine

## 2015-10-11 VITALS — BP 118/70 | HR 63 | Temp 98.4°F | Ht 62.0 in | Wt 129.0 lb

## 2015-10-11 DIAGNOSIS — R2681 Unsteadiness on feet: Secondary | ICD-10-CM

## 2015-10-11 DIAGNOSIS — E034 Atrophy of thyroid (acquired): Secondary | ICD-10-CM

## 2015-10-11 DIAGNOSIS — E038 Other specified hypothyroidism: Secondary | ICD-10-CM

## 2015-10-11 DIAGNOSIS — G609 Hereditary and idiopathic neuropathy, unspecified: Secondary | ICD-10-CM

## 2015-10-11 DIAGNOSIS — G20A1 Parkinson's disease without dyskinesia, without mention of fluctuations: Secondary | ICD-10-CM

## 2015-10-11 DIAGNOSIS — I872 Venous insufficiency (chronic) (peripheral): Secondary | ICD-10-CM | POA: Diagnosis not present

## 2015-10-11 DIAGNOSIS — I350 Nonrheumatic aortic (valve) stenosis: Secondary | ICD-10-CM | POA: Diagnosis not present

## 2015-10-11 DIAGNOSIS — G20 Parkinson's disease: Secondary | ICD-10-CM | POA: Diagnosis not present

## 2015-10-11 DIAGNOSIS — G3184 Mild cognitive impairment, so stated: Secondary | ICD-10-CM | POA: Diagnosis not present

## 2015-10-11 NOTE — Patient Instructions (Signed)
No medication changes today.

## 2015-10-11 NOTE — Progress Notes (Signed)
Location:  Occupational psychologist of Service:  Clinic (12)  Provider: Athelene Hursey L. Mariea Clonts, D.O., C.M.D.  Code Status: DNR Goals of Care:  Advanced Directives 10/11/2015  Does patient have an advance directive? Yes  Type of Paramedic of [place];Out of facility DNR (pink MOST or yellow form)  Copy of advanced directive(s) in chart? Yes  Pre-existing out of facility DNR order (yellow form or pink MOST form) Yellow form placed in chart (order not valid for inpatient use)   Chief Complaint  Patient presents with  . Medical Management of Chronic Issues    3 mth follow-up    HPI: Patient is a 82 y.o. female seen today for medical management of chronic diseases.    Since she was last seen, we had referred her to neurology and Dr. Carles Collet did feel that she has Parkinson's disease and her peripheral neuropathy.  She started her on sinemet.  She feels better and stronger.  She is doing more walking.  PT has given her things to do in the meantime.  She enjoys walking.    No recent falls.  She is using a rollator walker.  She is up to a full pill three times a day.    Neuropathic pains are better, too.  Usually heels and toes bother her.  Rarely legs.  If feet get warm, she feels pins and needles pricking.  The gabapentin 200mg  twice a day is quite helpful for her.  Three middle toes on left foot are the worse culprits. Heels bother her more if she is walking more.    BP excellent today.  Takes losartan and hctz.  Review of bps does show some variability up into 130s sometimes.    Says she has to really work hard to do small things compared to when she was young and so very active.  Always enjoyed making house plans.  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    Went to Dr. Ellie Lunch here last week or the week before and had a great report.  Has small cataracts, but not in need of surgery yet.  Got new glasses that are lighter  weight which she likes better.    She gave up her driving after her episode in March, but not due for renewal until next year.    Past Medical History  Diagnosis Date  . Hyperlipidemia   . Hypothyroidism   . Gilbert syndrome   . Rheumatic fever     does not take Flu shot   . Osteoporosis   . Aortic stenosis, mild   . HTN (hypertension)   . Melanoma ([place])   . Neuropathy [facility])     Past Surgical History  Procedure Laterality Date  . Abdominal hysterectomy  1963    no BSO; no abnormal PAPs  . Hemorrhoid surgery  1972  . Colonoscopy  2000    negative; [place]  . Melanoma excision  08/2014    Allergies  Allergen Reactions  . Sulfonamide Derivatives     REACTION:generalized  swelling  . Sulfa Antibiotics Swelling      Medication List       This list is accurate as of: 10/11/15  9:36 AM.  Always use your most recent med list.               acetaminophen 325 MG tablet  Commonly known as:  TYLENOL  Take 650 mg by mouth every 6 (six) hours  as needed.     bisacodyl 5 MG EC tablet  Generic drug:  bisacodyl  Take 5 mg by mouth daily as needed for moderate constipation.     CALCIUM 600/VITAMIN D PO  Take by mouth daily.     carbidopa-levodopa 25-100 MG tablet  Commonly known as:  SINEMET IR  Take 1 tablet by mouth 3 (three) times daily.     Digestive Enzymes Tabs  Take 2 tablets by mouth as needed.     gabapentin 100 MG capsule  Commonly known as:  NEURONTIN  Take 200 mg by mouth 2 (two) times daily.     hydrochlorothiazide 25 MG tablet  Commonly known as:  HYDRODIURIL  Take 25 mg by mouth daily.     levothyroxine 50 MCG tablet  Commonly known as:  SYNTHROID, LEVOTHROID  Take one on Sunday, Monday, Wednesday, and Friday     levothyroxine 75 MCG tablet  Commonly known as:  SYNTHROID, LEVOTHROID  Take one on Tuesday, Thursday, and Saturday.     losartan 50 MG tablet  Commonly known as:  COZAAR  Take 50 mg by mouth 2 (two) times daily.     [place] SPF 30  EX  Apply topically.     multivitamin capsule  Take 1 capsule by mouth daily.     omeprazole 20 MG capsule  Commonly known as:  PRILOSEC  Take 20 mg by mouth daily.     pravastatin 20 MG tablet  Commonly known as:  PRAVACHOL  TAKE 1 BY MOUTH DAILY     Vitamin D 2000 units Caps  Take 1 capsule (2,000 Units total) by mouth daily.        Review of Systems:  Review of Systems  Constitutional: Positive for malaise/fatigue. Negative for fever and chills.       Wt gain  HENT: Positive for hearing loss. Negative for congestion.        Rhinitis  Eyes:       Glasses  Respiratory: Negative for cough and shortness of breath.   Cardiovascular: Positive for leg swelling. Negative for chest pain and palpitations.       Edema well controlled with compression hose  Gastrointestinal: Negative for abdominal pain, constipation, blood in stool and melena.  Genitourinary: Negative for dysuria.  Musculoskeletal: Positive for joint pain and falls.  Neurological: Positive for tingling, tremors, sensory change and weakness. Negative for focal weakness, seizures and loss of consciousness.  Psychiatric/Behavioral: Positive for memory loss. Negative for depression. The patient is nervous/anxious.     Health Maintenance  Topic Date Due  . ZOSTAVAX  12/27/1993  . PNA vac Low Risk Adult (1 of 2 - PCV13) 12/28/1998  . INFLUENZA VACCINE  12/26/2015  . TETANUS/TDAP  08/10/2024  . DEXA SCAN  Completed    Physical Exam: Filed Vitals:   10/11/15 0849  BP: 118/70  Pulse: 63  Temp: 98.4 F (36.9 C)  TempSrc: Oral  Height: 5\' 2"  (1.575 m)  Weight: 129 lb (58.514 kg)  SpO2: 97%   Body mass index is 23.59 kg/(m^2). Physical Exam  Constitutional: She is oriented to person, place, and time. She appears well-developed and well-nourished. No distress.  HENT:  Hearing aides  Eyes:  glasses  Cardiovascular: Normal rate, regular rhythm and intact distal pulses.   Murmur heard. 3/6 systolic murmur  radiates to carotid and heard throughout precordium; chronic venous insufficiency; compression hose in place working well  Pulmonary/Chest: Effort normal and breath sounds normal. No respiratory distress. She has  no wheezes. She has no rales.  Abdominal: Soft. Bowel sounds are normal. She exhibits distension. She exhibits no mass. There is no tenderness. There is no rebound and no guarding.  Due to kyphosis  Musculoskeletal:  Shuffling gait, uses rollator walker  Neurological: She is alert and oriented to person, place, and time. No cranial nerve deficit. She exhibits abnormal muscle tone.  Diminished sensation in lower legs and feet  Skin: Skin is warm.  Psychiatric: She has a normal mood and affect.  Very pleasant lady, some short term memory loss during visit    Labs reviewed: Basic Metabolic Panel:  Recent Labs  12/09/14 1155 01/02/15 1550 01/30/15 1240 07/11/15  NA 141 143 141 145  K 3.7 3.8 3.6 3.9  CL 104 106 106  --   CO2 31 29 29   --   GLUCOSE 85 85 90  --   BUN 29* 36* 21* 24*  CREATININE 0.96 1.02* 0.79 0.9  CALCIUM 9.5 9.0 8.7*  --   TSH 5.44*  --   --  0.11*   Liver Function Tests:  Recent Labs  12/09/14 1155 01/30/15 1240 07/11/15  AST 29 32 23  ALT 22 21 17   ALKPHOS 35* 32* 31  BILITOT 1.3* 0.8  --   PROT 6.8 6.3*  --   ALBUMIN 4.1 3.5  --    No results for input(s): LIPASE, AMYLASE in the last 8760 hours. No results for input(s): AMMONIA in the last 8760 hours. CBC:  Recent Labs  12/09/14 1155 01/02/15 1550 01/30/15 1240 07/11/15  WBC 5.7 6.1 4.5 4.5  NEUTROABS 4.1 4.4 3.4  --   HGB 14.8 13.5 13.5 14.2  HCT 44.6 41.5 41.2 42  MCV 95.7 98.8 98.8  --   PLT 160.0 155 132* 162   Lipid Panel: No results for input(s): CHOL, HDL, LDLCALC, TRIG, CHOLHDL, LDLDIRECT in the last 8760 hours. No results found for: HGBA1C Lab Results  Component Value Date   TSH 0.11* 07/11/2015    Procedures since last visit: Neuro notes  reviewed.  Assessment/Plan 1. Parkinson's disease ([place]) -following now with Dr. Carles Collet -doing well with sinemet -cont use of walker--counseled at all times b/c she likes to furniture surf in her apt and has fallen doing this (does not recall though b/c fortunately no major injury)  2. Hereditary and idiopathic peripheral neuropathy -doing well with her gabapentin 200mg  po bid -she says it might be a little worse at times (see hpi), but she does not want to take more meds)  3. Mild cognitive impairment with memory loss -seems memory loss is worsening a bit, will f/u with mmse at her annual -not on meds at this point -in [place] and gets med mgt and needs some prompts for her ADLs at times  4. Hypothyroidism due to acquired atrophy of thyroid -cont synthroid 50 alternating with 69mcg -just had a normal TSH that was not abstracted--need to get in chart  5. Chronic venous insufficiency -cont compression hose, elevating feet at rest and avoiding high sodium foods that worsen this  6. Aortic valve stenosis -echo in March of 2016 showed EF 55-60 and only mild stenosis with valve area 1.53 cm^2 (vmax) with peak gradient of 60mmHg  7. Unsteady gait -due to PD and peripheral neuropathy, kyphoscoliosis -advised regular use of her walker at all times  Labs/tests ordered: no new (full panel in feb and tsh just had) Next appt:  02/14/2016 for med mgt   Eriel Dunckel L. Jaedan Schuman, D.O. Geriatrics  [facility] [address], [place] [address] Cell Phone (Mon-Fri 8am-5pm):  [phone] On Call:  [phone] & follow prompts after 5pm & weekends Office Phone:  [phone] Office Fax:  [phone]    1

## 2015-11-14 ENCOUNTER — Ambulatory Visit: Payer: BLUE CROSS/BLUE SHIELD | Admitting: Neurology

## 2015-11-22 DIAGNOSIS — D2211 Melanocytic nevi of right eyelid, including canthus: Secondary | ICD-10-CM | POA: Diagnosis not present

## 2015-11-22 DIAGNOSIS — D489 Neoplasm of uncertain behavior, unspecified: Secondary | ICD-10-CM | POA: Diagnosis not present

## 2015-11-22 DIAGNOSIS — L814 Other melanin hyperpigmentation: Secondary | ICD-10-CM | POA: Diagnosis not present

## 2015-11-22 DIAGNOSIS — D229 Melanocytic nevi, unspecified: Secondary | ICD-10-CM | POA: Diagnosis not present

## 2015-11-22 DIAGNOSIS — Z85820 Personal history of malignant melanoma of skin: Secondary | ICD-10-CM | POA: Diagnosis not present

## 2015-11-22 DIAGNOSIS — L570 Actinic keratosis: Secondary | ICD-10-CM | POA: Diagnosis not present

## 2015-11-24 ENCOUNTER — Encounter: Payer: Self-pay | Admitting: Neurology

## 2015-11-24 ENCOUNTER — Ambulatory Visit (INDEPENDENT_AMBULATORY_CARE_PROVIDER_SITE_OTHER): Payer: Medicare Other | Admitting: Neurology

## 2015-11-24 VITALS — BP 124/78 | HR 64 | Ht 63.0 in | Wt 128.0 lb

## 2015-11-24 DIAGNOSIS — Z85820 Personal history of malignant melanoma of skin: Secondary | ICD-10-CM | POA: Diagnosis not present

## 2015-11-24 DIAGNOSIS — G20 Parkinson's disease: Secondary | ICD-10-CM | POA: Diagnosis not present

## 2015-11-24 NOTE — Progress Notes (Signed)
Natalie Lam was seen today in the movement disorders clinic for neurologic consultation at the request of [name], TIFFANY, DO.  The patient is accompanied by her daughter who supplements the history.  Prior records made available to me were reviewed.  The patient has a history of gait instability.  Pts sx's started in March, 2016.  Pt thinks that it started quickly.  She remembers one particular day in March that she went to [facility] and she backed into the space and she realized that she tried to walk into [employer] and just felt that she would fall if she didn't get back in the car.  She had a near syncopal episode which was later determined due to HTN and mild AS.  After that, she seemed to go slowly down hill.  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  She had PT in May which helped her back but didn't really seem to help the gait.  Pt is getting ready to move to [facility] assisted [facility].  Pt states that she just has a "fear of falling because so many people my age do."  Admits that depression contributes to sx's but states that doing better than she was.  Pt denies lateralizing weakness/paresthesias.  Denies speech change but states that she "tires easily."  Denies feet paresthesias.      Specific Symptoms:  Tremor: No. Voice: denies hypophonic speech Sleep: sleeps well  Vivid Dreams:  Yes.   (only if eats at night)  Acting out dreams:  No. Wet Pillows: No. Postural symptoms:  Yes.   (no cane/walker to ambulate)  Falls?  No. Bradykinesia symptoms: slow movements Loss of smell:  No. Loss of taste:  No. Urinary Incontinence:  No. Difficulty Swallowing:  No. Handwriting, micrographia: No. (perhaps a little smaller) Trouble with ADL's:  No.  Trouble buttoning clothing: No. Depression:  Yes.   (multiple deaths in family) Memory changes:  Yes.   (but complicated by  deaths, moving, closing family business; still drives in familiar territory and parks far away to avoid other cars; pays monthly bills without issue; cooks for self until moves to [facility]) Hallucinations:  No.  visual distortions: No. N/V:  No. Lightheaded:  No.   Neuroimaging has previously been performed.  It is  available for my review today.  A CT of the brain was performed on 08/12/2014 after near syncopal episode.  I reviewed this.  There is atrophy and rather significant cervical small vessel disease.  A carotid ultrasound was performed on 09/06/2014.  This demonstrated 1-49% stenosis bilaterally.  09/13/15 update:  The patient is following up today, accompanied by her daughter who supplements the history.  I have not seen her since July, 2016 and that was only a one time visit.  She does have a history of peripheral neuropathy, which has led to gait instability.  Her peripheral neuropathy has also caused paresthesias in the feet and according to records she was started on gabapentin by the nurse practitioner at [facility] in October, 2016.  She states that she is f/u due to trouble walking for about 6 weeks.  Her daughter is a Marine scientist and agrees that the majority of the issue started about 6 weeks ago, with no new med changes.  Pt states that she is getting stuck in doorways.  "Once I get going, I am okay."  11/24/15 update:  Pt is  f/u today re: newly dx PD last visit. This patient is accompanied in the office by her child who supplements the history.  Reviewed records since last visit.  On levodopa tid now.  Tolerating it well.  Feels that she is doing much better.  States that she feels stronger in the legs and "I don't have to work to get across the doorway."  No hallucinations.  No lightheaded/near syncope.  No falls since last visit.  Did PT/OT at [facility] and feels it helped.  Uses rollator.     ALLERGIES:   Allergies  Allergen Reactions  . Sulfonamide Derivatives      REACTION:generalized  swelling  . Sulfa Antibiotics Swelling    CURRENT MEDICATIONS:  Outpatient Encounter Prescriptions as of 11/24/2015  Medication Sig  . acetaminophen (TYLENOL) 325 MG tablet Take 650 mg by mouth every 6 (six) hours as needed.   . bisacodyl (BISACODYL) 5 MG EC tablet Take 5 mg by mouth daily as needed for moderate constipation.  . Calcium Carbonate-Vitamin D (CALCIUM 600/VITAMIN D PO) Take by mouth daily.  . carbidopa-levodopa (SINEMET IR) 25-100 MG tablet Take 1 tablet by mouth 3 (three) times daily.   . Cholecalciferol (VITAMIN D) 2000 UNITS CAPS Take 1 capsule (2,000 Units total) by mouth daily.  . Digestive Enzymes TABS Take 2 tablets by mouth as needed.   . gabapentin (NEURONTIN) 100 MG capsule Take 200 mg by mouth 2 (two) times daily.   . hydrochlorothiazide (HYDRODIURIL) 25 MG tablet Take 25 mg by mouth daily.  [place] levothyroxine (SYNTHROID, LEVOTHROID) 50 MCG tablet Take one on Sunday, Monday, Wednesday, and Friday  . levothyroxine (SYNTHROID, LEVOTHROID) 75 MCG tablet Take one on Tuesday, Thursday, and Saturday.  . losartan (COZAAR) 50 MG tablet Take 50 mg by mouth 2 (two) times daily.  . Multiple Vitamin (MULTIVITAMIN) capsule Take 1 capsule by mouth daily.    Marland Kitchen omeprazole (PRILOSEC) 20 MG capsule Take 20 mg by mouth daily.   . pravastatin (PRAVACHOL) 20 MG tablet TAKE 1 BY MOUTH DAILY  . Scar Treatment Products ([place] SPF 30 EX) Apply topically.   No facility-administered encounter medications on file as of 11/24/2015.    PAST MEDICAL HISTORY:   Past Medical History  Diagnosis Date  . Hyperlipidemia   . Hypothyroidism   . Gilbert syndrome   . Rheumatic fever     does not take Flu shot   . Osteoporosis   . Aortic stenosis, mild   . HTN (hypertension)   . Melanoma ([place])   . Neuropathy ([place])     PAST SURGICAL HISTORY:   Past Surgical History  Procedure Laterality Date  . Abdominal hysterectomy  1963    no BSO; no abnormal PAPs  . Hemorrhoid  surgery  1972  . Colonoscopy  2000    negative; [place]  . Melanoma excision  08/2014    SOCIAL HISTORY:   Social History   Social History  . Marital Status: Widowed    Spouse Name: N/A  . Number of Children: N/A  . Years of Education: N/A   Occupational History  . retired own [employer]  . Smoking status: Never Smoker   . Smokeless tobacco: Never Used  . Alcohol Use: No  . Drug Use: No  . Sexual Activity: Not on file   Other Topics Concern  . Not on file   Social History Narrative   Lives at [facility] 01/05/2015   Widowed  Never smoked   Exercise walking   Alcohol none   POA/Living Will          FAMILY HISTORY:   Family Status  Relation Status Death Age  . Mother Deceased     alzheimers, stroke  . Son Deceased     multiple myeloma  . Son Deceased     pancreatic cancer  . Father Deceased     unknown  . Brother Alive   . Brother Deceased   . Sister Alive   . Sister Alive   . Daughter Alive   . Daughter Deceased     decapitated in MVA    ROS:  Some LE edema.  A complete 10 system review of systems was obtained and was unremarkable apart from what is mentioned above.  PHYSICAL EXAMINATION:    VITALS:   There were no vitals filed for this visit.  GEN:  The patient appears stated age and is in NAD. HEENT:  Normocephalic, atraumatic.  The mucous membranes are moist. The superficial temporal arteries are without ropiness or tenderness. CV:  RRR with 3/6 SEM Lungs:  CTAB Neck/HEME:  There are no carotid bruits bilaterally.  Neurological examination:  Orientation: Had trouble with trail making.  Did well with orientation. Montreal Cognitive Assessment  12/13/2014  Visuospatial/ Executive (0/5) 3  Naming (0/3) 2  Attention: Read list of digits (0/2) 2  Attention: Read list of letters (0/1) 1  Attention: Serial 7 subtraction starting at 100 (0/3) 1  Language: Repeat phrase (0/2) 2  Language :  Fluency (0/1) 1  Abstraction (0/2) 2  Delayed Recall (0/5) 0  Orientation (0/6) 6  Total 20  Adjusted Score (based on education) 21    Cranial nerves: There is good facial symmetry.  Extraocular muscles are intact. The visual fields are full to confrontational testing. The speech is fluent and clear. Soft palate rises symmetrically and there is no tongue deviation. Hearing is intact to conversational tone. Sensation: Sensation is intact to light touch throughout Motor: Strength is 5/5 in the bilateral upper and lower extremities.   Shoulder shrug is equal and symmetric.  There is no pronator drift.  Movement examination: Tone: There is normal tone bilaterally Abnormal movements: None Coordination:  There is decremation only with toe taps bilaterally and with heel taps on the L Gait and Station: The patient makes 2 attempts to get out of the chair, and then she is able to arise without the use of her hands.  She has start hesitation.  She does hesitate in the doorway.  The left foot sticks to ground with turning but she no longer shuffles without the walker.  Stride length is better and longer with the walker.    Labs:  Lab Results  Component Value Date   [id number] 08/11/2014   No results found for: HGBA1C    ASSESSMENT/PLAN:  1.  Parkinson's disease  -dx formally in 08/2015  -We discussed the diagnosis as well as pathophysiology of the disease.  We discussed treatment options as well as prognostic indicators.  Patient education was provided.  -continue to carbidopa/levodopa 25/100 tid.    -doing walking but encouraged her to add stationary bike and work with the physical therapist on getting on/off the bike.  PT told her that she should not bike but if they have a recumbant bike at [place], would love her to use that.  2.  Melanoma  -discussed with the patient that PD increases risk of melanoma and just had  one removed and sees [facility] dermatology.    3.  Follow up  is anticipated in the next few months, sooner should new neurologic issues arise.  Much greater than 50% of this visit was spent in counseling and coordinating care.  Total face to face time:  25 min

## 2015-12-22 LAB — TSH: TSH: 2.47 u[IU]/mL (ref <=5.90)

## 2016-01-17 DIAGNOSIS — Z79899 Other long term (current) drug therapy: Secondary | ICD-10-CM | POA: Diagnosis not present

## 2016-01-17 DIAGNOSIS — R319 Hematuria, unspecified: Secondary | ICD-10-CM | POA: Diagnosis not present

## 2016-01-17 DIAGNOSIS — N390 Urinary tract infection, site not specified: Secondary | ICD-10-CM | POA: Diagnosis not present

## 2016-01-22 ENCOUNTER — Ambulatory Visit: Payer: BLUE CROSS/BLUE SHIELD | Admitting: Physician Assistant

## 2016-01-23 ENCOUNTER — Non-Acute Institutional Stay: Payer: Medicare Other | Admitting: Internal Medicine

## 2016-01-23 ENCOUNTER — Encounter: Payer: Self-pay | Admitting: Internal Medicine

## 2016-01-23 DIAGNOSIS — R140 Abdominal distension (gaseous): Secondary | ICD-10-CM | POA: Diagnosis not present

## 2016-01-23 DIAGNOSIS — K5900 Constipation, unspecified: Secondary | ICD-10-CM | POA: Diagnosis not present

## 2016-01-23 DIAGNOSIS — R109 Unspecified abdominal pain: Secondary | ICD-10-CM | POA: Diagnosis not present

## 2016-01-23 NOTE — Progress Notes (Signed)
Patient ID: Natalie Lam, female   DOB: [birth year], 82 y.o.   MRN: [id number]  Location:   [facility] Room Number: [id number] Place of Service:  ALF (13) Provider:  Codi Folkerts L. Mariea Clonts, D.O., C.M.D.  Hollace Kinnier, DO  Patient Care Team: Gayland Curry, DO as PCP - General (Geriatric Medicine) Jola Schmidt, MD as Consulting Physician (Ophthalmology) Ludwig Clarks, DO as Consulting Physician (Neurology) Peter M Martinique, MD as Consulting Physician (Cardiology)  Extended Emergency Contact Information Primary Emergency Contact: [name] Address: [address]          [name] Phone: [phone] Mobile Phone: [phone] Relation: Daughter  Code Status:  DNR Goals of care: Advanced Directive information Advanced Directives 01/23/2016  Does patient have an advance directive? Yes  Type of Advance Directive Out of facility DNR (pink MOST or yellow form);[place];Living will  Does patient want to make changes to advanced directive? -  Copy of advanced directive(s) in chart? Yes  Would patient like information on creating an advanced directive? -  Pre-existing out of facility DNR order (yellow form or pink MOST form) Yellow form placed in chart (order not valid for inpatient use)     Chief Complaint  Patient presents with  . Acute Visit    abdominal distention    HPI:  Pt is a 82 y.o. female seen today for an acute visit for her abdominal distention.  Her daughter was very concerned about her increased constipation and abdominal distention.  I went to see Natalie Lam. She was in the dining room sitting alone waiting for her evening meal.  I spoke with her and she denied any abdominal pain whatsoever.  She had not noted any blood in her stool or dark stool.  Staff have been treating her constipation so she has BMs.  A KUB was ordered earlier in the day and it was negative for any obstruction just noting some retained  stool.  She has a family history of several different types of cancer so Natalie Lam was especially concerned about this.     Past Medical History:  Diagnosis Date  . Aortic stenosis, mild   . Gilbert syndrome   . HTN (hypertension)   . Hyperlipidemia   . Hypothyroidism   . Melanoma ([place])   . Neuropathy ([place])   . Osteoporosis   . Rheumatic fever    does not take Flu shot    Past Surgical History:  Procedure Laterality Date  . ABDOMINAL HYSTERECTOMY  1963   no BSO; no abnormal PAPs  . COLONOSCOPY  2000   negative; [place]  . [place]  . MELANOMA EXCISION  08/2014    Allergies  Allergen Reactions  . Sulfonamide Derivatives     REACTION:generalized  swelling  . Sulfa Antibiotics Swelling      Medication List       Accurate as of 01/23/16  2:30 PM. Always use your most recent med list.          bisacodyl 5 MG EC tablet Generic drug:  bisacodyl Take 5 mg by mouth daily as needed for moderate constipation.   CALCIUM 600/VITAMIN D PO Take by mouth daily.   carbidopa-levodopa 25-100 MG tablet Commonly known as:  SINEMET IR Take 1 tablet by mouth 3 (three) times daily.   docusate sodium 100 MG capsule Commonly known as:  COLACE Take 100 mg by mouth daily. For constipation, hold for loose  stool.   gabapentin 100 MG capsule Commonly known as:  NEURONTIN Take 200 mg by mouth 2 (two) times daily.   hydrochlorothiazide 25 MG tablet Commonly known as:  HYDRODIURIL Take 25 mg by mouth daily.   levothyroxine 50 MCG tablet Commonly known as:  SYNTHROID, LEVOTHROID Take one on Sunday, Monday, Wednesday, and Friday   levothyroxine 75 MCG tablet Commonly known as:  SYNTHROID, LEVOTHROID Take one on Tuesday, Thursday, and Saturday.   losartan 50 MG tablet Commonly known as:  COZAAR Take 50 mg by mouth 2 (two) times daily.   [place] SPF 30 EX Apply topically.   multivitamin capsule Take 1 capsule by mouth daily.   omeprazole 20 MG capsule Commonly  known as:  PRILOSEC Take 20 mg by mouth daily.   pravastatin 20 MG tablet Commonly known as:  PRAVACHOL TAKE 1 BY MOUTH DAILY   Vitamin D 2000 units Caps Take 1 capsule (2,000 Units total) by mouth daily.       Review of Systems  Constitutional: Negative for activity change, appetite change, chills and fatigue.  HENT: Negative for congestion.   Eyes: Negative for visual disturbance.       Glasses  Respiratory: Negative for shortness of breath.   Cardiovascular: Negative for chest pain and leg swelling.       Wears compression hose to control edema and avoids high sodium foods  Gastrointestinal: Positive for abdominal distention and constipation. Negative for abdominal pain, anal bleeding, blood in stool, diarrhea, nausea, rectal pain and vomiting.  Genitourinary: Negative for dysuria.  Musculoskeletal: Positive for gait problem.       Due to Parkinson's and neuropathy  Skin: Negative for color change and pallor.  Neurological: Positive for numbness.  Hematological: Negative for adenopathy. Bruises/bleeds easily.  Psychiatric/Behavioral:       Some short term memory loss    Immunization History  Administered Date(s) Administered  . Tdap 08/11/2014   Pertinent  Health Maintenance Due  Topic Date Due  . PNA vac Low Risk Adult (1 of 2 - PCV13) 12/28/1998  . INFLUENZA VACCINE  12/26/2015  . DEXA SCAN  Completed   Fall Risk  11/24/2015 10/11/2015 01/11/2015 08/18/2014 06/21/2013  Falls in the past year? No No Yes Yes No  Number falls in past yr: - - 2 or more 2 or more -  Injury with Fall? - - Yes No -  Follow up - - - Education provided -   Functional Status Survey:    Vitals:   01/23/16 1413  BP: 130/78  Pulse: (!) 56  Resp: 18  Temp: 97.6 F (36.4 C)  TempSrc: Oral  SpO2: 97%  Weight: 126 lb (57.2 kg)   Body mass index is 22.32 kg/m. Physical Exam  Constitutional: She appears well-developed and well-nourished. No distress.  Cardiovascular: Normal rate,  regular rhythm, normal heart sounds and intact distal pulses.   Compression hose in place  Pulmonary/Chest: Effort normal and breath sounds normal. No respiratory distress. She has no wheezes. She has no rales.  Abdominal: Bowel sounds are normal. She exhibits distension.  Abdomen could not properly be examined at that time due to pt being in dining area and meal arriving in the middle of the visit  Musculoskeletal: Normal range of motion.  Neurological: She is alert.  Oriented to person, place and basic time (not precise date)  Skin: Skin is warm and dry.  Psychiatric: She has a normal mood and affect. Her behavior is normal.  Very pleasant lady  Labs reviewed:  Recent Labs  01/30/15 1240 07/11/15  NA 141 145  K 3.6 3.9  CL 106  --   CO2 29  --   GLUCOSE 90  --   BUN 21* 24*  CREATININE 0.79 0.9  CALCIUM 8.7*  --     Recent Labs  01/30/15 1240 07/11/15  AST 32 23  ALT 21 17  ALKPHOS 32* 31  BILITOT 0.8  --   PROT 6.3*  --   ALBUMIN 3.5  --     Recent Labs  01/30/15 1240 07/11/15  WBC 4.5 4.5  NEUTROABS 3.4  --   HGB 13.5 14.2  HCT 41.2 42  MCV 98.8  --   PLT 132* 162   Lab Results  Component Value Date   TSH 2.77 10/03/2015   No results found for: HGBA1C Lab Results  Component Value Date   CHOL 199 08/12/2014   HDL 87 08/12/2014   LDLCALC 104 (H) 08/12/2014   LDLDIRECT 105.9 06/15/2013   TRIG 38 08/12/2014   CHOLHDL 2.3 08/12/2014    Significant Diagnostic Results in last 30 days:  KUB:  Negative for obstruction  Assessment/Plan 1. Abdominal distention -no obstruction or emergent problem -pt without any pain or red flags -will check hemoccult x 3 -if these are negative and increased constipation and distention persist, would further image at that time  2. Constipation, unspecified constipation type -cont colace plus bisacodyl prn -might consider change to senokot s   Family/ staff Communication: discussed with her daughter before visit,  nursing staff from [facility]  Labs/tests ordered:  KUB, hemoccult x 3

## 2016-01-24 ENCOUNTER — Emergency Department (HOSPITAL_BASED_OUTPATIENT_CLINIC_OR_DEPARTMENT_OTHER): Payer: Medicare Other

## 2016-01-24 ENCOUNTER — Encounter (HOSPITAL_BASED_OUTPATIENT_CLINIC_OR_DEPARTMENT_OTHER): Payer: Self-pay | Admitting: Emergency Medicine

## 2016-01-24 ENCOUNTER — Emergency Department (HOSPITAL_BASED_OUTPATIENT_CLINIC_OR_DEPARTMENT_OTHER)
Admission: EM | Admit: 2016-01-24 | Discharge: 2016-01-24 | Disposition: A | Discharge status: Home / Self Care | Payer: Medicare Other | Attending: Emergency Medicine | Admitting: Emergency Medicine

## 2016-01-24 DIAGNOSIS — N8302 Follicular cyst of left ovary: Secondary | ICD-10-CM | POA: Diagnosis not present

## 2016-01-24 DIAGNOSIS — K5730 Diverticulosis of large intestine without perforation or abscess without bleeding: Secondary | ICD-10-CM | POA: Diagnosis not present

## 2016-01-24 DIAGNOSIS — I10 Essential (primary) hypertension: Secondary | ICD-10-CM | POA: Insufficient documentation

## 2016-01-24 DIAGNOSIS — E039 Hypothyroidism, unspecified: Secondary | ICD-10-CM | POA: Insufficient documentation

## 2016-01-24 DIAGNOSIS — N83202 Unspecified ovarian cyst, left side: Secondary | ICD-10-CM | POA: Diagnosis not present

## 2016-01-24 DIAGNOSIS — Z79899 Other long term (current) drug therapy: Secondary | ICD-10-CM | POA: Diagnosis not present

## 2016-01-24 DIAGNOSIS — K5900 Constipation, unspecified: Secondary | ICD-10-CM

## 2016-01-24 DIAGNOSIS — G20 Parkinson's disease: Secondary | ICD-10-CM | POA: Insufficient documentation

## 2016-01-24 DIAGNOSIS — N83292 Other ovarian cyst, left side: Secondary | ICD-10-CM | POA: Diagnosis not present

## 2016-01-24 HISTORY — DX: Parkinson's disease without dyskinesia, without mention of fluctuations: G20.A1

## 2016-01-24 HISTORY — DX: Parkinson's disease: G20

## 2016-01-24 LAB — CBC WITH DIFFERENTIAL/PLATELET
Basophils Absolute: 0 10*3/uL (ref 0.0–0.1)
Basophils Relative: 0 %
Eosinophils Absolute: 0.1 10*3/uL (ref 0.0–0.7)
Eosinophils Relative: 1 %
HCT: 42.9 % (ref 36.0–46.0)
Hemoglobin: 14.3 g/dL (ref 12.0–15.0)
Lymphocytes Relative: 25 %
Lymphs Abs: 1.1 10*3/uL (ref 0.7–4.0)
MCH: 32.5 pg (ref 26.0–34.0)
MCHC: 33.3 g/dL (ref 30.0–36.0)
MCV: 97.5 fL (ref 78.0–100.0)
Monocytes Absolute: 0.4 10*3/uL (ref 0.1–1.0)
Monocytes Relative: 9 %
Neutro Abs: 2.8 10*3/uL (ref 1.7–7.7)
Neutrophils Relative %: 65 %
Platelets: 130 10*3/uL — ABNORMAL LOW (ref 150–400)
RBC: 4.4 MIL/uL (ref 3.87–5.11)
RDW: 12.9 % (ref 11.5–15.5)
WBC: 4.4 10*3/uL (ref 4.0–10.5)

## 2016-01-24 LAB — URINALYSIS, ROUTINE W REFLEX MICROSCOPIC
Bilirubin Urine: NEGATIVE
Glucose, UA: NEGATIVE mg/dL
Hgb urine dipstick: NEGATIVE
KETONES UR: NEGATIVE mg/dL
Leukocytes, UA: NEGATIVE
Nitrite: NEGATIVE
Protein, ur: NEGATIVE mg/dL
Specific Gravity, Urine: 1.016 (ref 1.005–1.030)
pH: 6.5 (ref 5.0–8.0)

## 2016-01-24 LAB — COMPREHENSIVE METABOLIC PANEL
ALT: 5 U/L — AB (ref 14–54)
AST: 23 U/L (ref 15–41)
Albumin: 3.7 g/dL (ref 3.5–5.0)
Alkaline Phosphatase: 32 U/L — ABNORMAL LOW (ref 38–126)
Anion gap: 6 (ref 5–15)
BUN: 22 mg/dL — AB (ref 6–20)
CO2: 31 mmol/L (ref 22–32)
Calcium: 9.2 mg/dL (ref 8.9–10.3)
Chloride: 100 mmol/L — ABNORMAL LOW (ref 101–111)
Creatinine, Ser: 0.89 mg/dL (ref 0.44–1.00)
GFR calc non Af Amer: 59 mL/min — ABNORMAL LOW (ref >60)
Glucose, Bld: 89 mg/dL (ref 65–99)
Potassium: 3.5 mmol/L (ref 3.5–5.1)
SODIUM: 137 mmol/L (ref 135–145)
Total Bilirubin: 1.5 mg/dL — ABNORMAL HIGH (ref 0.3–1.2)
Total Protein: 6.3 g/dL — ABNORMAL LOW (ref 6.5–8.1)

## 2016-01-24 MED ORDER — IOPAMIDOL (ISOVUE-300) INJECTION 61%
100.0000 mL | Freq: Once | INTRAVENOUS | Status: AC | PRN
  Administered 2016-01-24: 100 mL via INTRAVENOUS

## 2016-01-24 MED ORDER — POLYETHYLENE GLYCOL 3350 17 G PO PACK: 17.0000 g | PACK | Freq: Every day | ORAL | 0 refills | Status: DC

## 2016-01-24 NOTE — ED Notes (Signed)
PA at bedside.

## 2016-01-24 NOTE — ED Triage Notes (Signed)
Pt c/o ongoing constipation. Pt reports taking milk of magnesia Sunday with success. No BM since Monday. Facility where she lives would not give her any more. Also reports abd distention, denies pain.

## 2016-01-24 NOTE — Discharge Instructions (Signed)
Please use MiraLAX daily as needed for constipation. Please use anti-gas medication as needed for gas and bloating.

## 2016-01-24 NOTE — ED Provider Notes (Signed)
[facility] DEPT [facility] Provider Note   CSN: [id number] Arrival date & time: 01/24/16  1019     History   Chief Complaint Chief Complaint  Patient presents with  . Constipation    HPI Natalie Lam is a 82 y.o. female.  HPI   32 YOF presents today with complaints of constipation. Pt's daughter is at bedside who reports that over the last several months she has progressive difficulty with BM. She notes that she has had progressively hardened stools and less output. She notes that they have been using dietary modifications to increase bulk, with the addition of milk of mag. Pt states that her abdomen had become more distended; daughter noted that yesterday she had significant distention that was concerning to her. Pt notes she continues to pass gas but it has decreased. Last BM 3 days ago. Pt does admit to some nausea, no vomiting, fever, or acute abdominal pain other than the slight discomfort with distention. She does not a history of skin cancer, and sig family history of cancer; no active cancer at this time. No change in stool caliber. Surgical history of abdominal hysterectomy.    Past Medical History:  Diagnosis Date  . Aortic stenosis, mild   . Gilbert syndrome   . HTN (hypertension)   . Hyperlipidemia   . Hypothyroidism   . Melanoma ([place])   . Neuropathy ([place])   . Osteoporosis   . Parkinson's disease ([place])   . Rheumatic fever    does not take Flu shot     Patient Active Problem List   Diagnosis Date Noted  . Parkinson's disease ([place]) 09/13/2015  . Mild cognitive impairment with memory loss 04/22/2015  . Other fatigue 04/22/2015  . Urge urinary incontinence 04/22/2015  . Unsteady gait 04/22/2015  . Chronic venous insufficiency 04/22/2015  . Hereditary and idiopathic peripheral neuropathy 04/22/2015  . Thoracic compression fracture ([place]) 01/11/2015  . History of melanoma 12/09/2014  . Torticollis 12/09/2014  . Abnormality of gait 12/09/2014  . Bilateral  carotid bruits 08/18/2014  . Aortic valve stenosis 08/18/2014  . Syncope 08/11/2014  . Gilbert's syndrome 08/11/2014  . Heart murmur, systolic 90/30/0923  . HTN (hypertension) 06/25/2013  . Skin cancer 07/14/2012  . Hypopotassemia 04/16/2012  . Abnormal serum level of alkaline phosphatase 04/16/2012  . Esophageal reflux 06/27/2010  . THYROID NODULE, LEFT 04/18/2009  . Vitamin D deficiency 10/01/2007  . Hypothyroidism 08/21/2007  . HLD (hyperlipidemia) 08/21/2007  . Varicose veins of both lower extremities 08/21/2007  . Senile osteoporosis 08/21/2007    Past Surgical History:  Procedure Laterality Date  . ABDOMINAL HYSTERECTOMY  1963   no BSO; no abnormal PAPs  . COLONOSCOPY  2000   negative; [place]  . [place]  . MELANOMA EXCISION  08/2014    OB History    No data available       Home Medications    Prior to Admission medications   Medication Sig Start Date End Date Taking? Authorizing Provider  bisacodyl (BISACODYL) 5 MG EC tablet Take 5 mg by mouth daily as needed for moderate constipation.    Historical Provider, MD  Calcium Carbonate-Vitamin D (CALCIUM 600/VITAMIN D PO) Take by mouth daily.    Historical Provider, MD  carbidopa-levodopa (SINEMET IR) 25-100 MG tablet Take 1 tablet by mouth 3 (three) times daily.  09/28/15   Historical Provider, MD  Cholecalciferol (VITAMIN D) 2000 UNITS CAPS Take 1 capsule (2,000 Units total) by mouth daily. 01/11/15   [name]  [name], DO  docusate sodium (COLACE) 100 MG capsule Take 100 mg by mouth daily. For constipation, hold for loose stool.    Historical Provider, MD  gabapentin (NEURONTIN) 100 MG capsule Take 200 mg by mouth 2 (two) times daily.     Historical Provider, MD  hydrochlorothiazide (HYDRODIURIL) 25 MG tablet Take 25 mg by mouth daily.    Historical Provider, MD  levothyroxine (SYNTHROID, LEVOTHROID) 50 MCG tablet Take one on Sunday, Monday, Wednesday, and Friday    Historical Provider, MD  levothyroxine  (SYNTHROID, LEVOTHROID) 75 MCG tablet Take one on Tuesday, Thursday, and Saturday. 08/22/15   Historical Provider, MD  losartan (COZAAR) 50 MG tablet Take 50 mg by mouth 2 (two) times daily.    Historical Provider, MD  Multiple Vitamin (MULTIVITAMIN) capsule Take 1 capsule by mouth daily.      Historical Provider, MD  omeprazole (PRILOSEC) 20 MG capsule Take 20 mg by mouth daily.  10/04/15   Historical Provider, MD  polyethylene glycol (MIRALAX) packet Take 17 g by mouth daily. 01/24/16   Okey Regal, PA-C  pravastatin (PRAVACHOL) 20 MG tablet TAKE 1 BY MOUTH DAILY 09/15/14   Hendricks Limes, MD  Scar Treatment Products [facility] SPF 30 EX) Apply topically.    Historical Provider, MD    Family History Family History  Problem Relation Age of Onset  . Dementia Mother     Alzheimers  . Stroke Mother     ? 19  . Cancer Son     multiple myeloma  . Pancreatic cancer Son   . Heart disease Brother   . Lung cancer Maternal Uncle     smoker  . Heart attack Neg Hx   . Diabetes Neg Hx   . Hearing loss Neg Hx     Social History Social History  Substance Use Topics  . Smoking status: Never Smoker  . Smokeless tobacco: Never Used  . Alcohol use No     Allergies   Sulfonamide derivatives and Sulfa antibiotics   Review of Systems Review of Systems  All other systems reviewed and are negative.    Physical Exam Updated Vital Signs BP 138/71 (BP Location: Right Arm)   Pulse (!) 59   Temp 98 F (36.7 C) (Oral)   Resp 16   Ht '5\' 2"'  (1.575 m)   Wt 57.2 kg   SpO2 97%   BMI 23.05 kg/m   Physical Exam  Constitutional: She is oriented to person, place, and time. She appears well-developed and well-nourished.  HENT:  Head: Normocephalic and atraumatic.  Eyes: Conjunctivae are normal. Pupils are equal, round, and reactive to light. Right eye exhibits no discharge. Left eye exhibits no discharge. No scleral icterus.  Neck: Normal range of motion. No JVD present. No tracheal deviation  present.  Pulmonary/Chest: Effort normal. No stridor.  Abdominal: Soft. She exhibits distension. She exhibits no mass. There is no tenderness. There is no rebound and no guarding. No hernia.  Neurological: She is alert and oriented to person, place, and time. Coordination normal.  Psychiatric: She has a normal mood and affect. Her behavior is normal. Judgment and thought content normal.  Nursing note and vitals reviewed.    ED Treatments / Results  Labs (all labs ordered are listed, but only abnormal results are displayed) Labs Reviewed  COMPREHENSIVE METABOLIC PANEL - Abnormal; Notable for the following:       Result Value   Chloride 100 (*)    BUN 22 (*)    Total  Protein 6.3 (*)    ALT 5 (*)    Alkaline Phosphatase 32 (*)    Total Bilirubin 1.5 (*)    GFR calc non Af Amer 59 (*)    All other components within normal limits  CBC WITH DIFFERENTIAL/PLATELET - Abnormal; Notable for the following:    Platelets 130 (*)    All other components within normal limits  URINALYSIS, ROUTINE W REFLEX MICROSCOPIC (NOT AT [facility])    EKG  EKG Interpretation None       Radiology US Transvaginal Non-ob  Result Date: 01/24/2016 CLINICAL DATA:  82 year old female with cystic lesion of the left adnexal region noted on recent CT examination. Followup study. EXAM: TRANSABDOMINAL AND TRANSVAGINAL ULTRASOUND OF PELVIS TECHNIQUE: Both transabdominal and transvaginal ultrasound examinations of the pelvis were performed. Transabdominal technique was performed for global imaging of the pelvis including uterus, ovaries, adnexal regions, and pelvic cul-de-sac. It was necessary to proceed with endovaginal exam following the transabdominal exam to visualize the ovaries. COMPARISON:  No prior.  CT the abdomen and pelvis 01/24/2016. FINDINGS: Uterus Status post hysterectomy. Right ovary Measurements: 2.5 x 1.3 x 1.4 cm. Normal appearance/no adnexal mass. Left ovary Measurements: 5.1 x 3.6 x 4.7 cm. There is a  single anechoic lesion with increased through transmission measuring 3.5 x 3.3 x 3.3 cm. This lesion demonstrates no mural nodularity, internal septations or internal blood flow. Other findings No abnormal free fluid. IMPRESSION: 1. Large simple cyst in the left ovary has a benign appearance. Repeat pelvic ultrasound is recommended in 1 year to ensure the stability of this finding. This recommendation follows the consensus statement: Management of Asymptomatic Ovarian and Other Adnexal Cysts Imaged at [place]: Society of Radiologists in [place]. Radiology 2010; [phone]. 2. Status post hysterectomy. Electronically Signed   By: Vinnie Langton M.D.   On: 01/24/2016 14:13   US Pelvis Complete  Result Date: 01/24/2016 CLINICAL DATA:  82 year old female with cystic lesion of the left adnexal region noted on recent CT examination. Followup study. EXAM: TRANSABDOMINAL AND TRANSVAGINAL ULTRASOUND OF PELVIS TECHNIQUE: Both transabdominal and transvaginal ultrasound examinations of the pelvis were performed. Transabdominal technique was performed for global imaging of the pelvis including uterus, ovaries, adnexal regions, and pelvic cul-de-sac. It was necessary to proceed with endovaginal exam following the transabdominal exam to visualize the ovaries. COMPARISON:  No prior.  CT the abdomen and pelvis 01/24/2016. FINDINGS: Uterus Status post hysterectomy. Right ovary Measurements: 2.5 x 1.3 x 1.4 cm. Normal appearance/no adnexal mass. Left ovary Measurements: 5.1 x 3.6 x 4.7 cm. There is a single anechoic lesion with increased through transmission measuring 3.5 x 3.3 x 3.3 cm. This lesion demonstrates no mural nodularity, internal septations or internal blood flow. Other findings No abnormal free fluid. IMPRESSION: 1. Large simple cyst in the left ovary has a benign appearance. Repeat pelvic ultrasound is recommended in 1 year to ensure the stability of this finding. This recommendation  follows the consensus statement: Management of Asymptomatic Ovarian and Other Adnexal Cysts Imaged at [place]: Society of Radiologists in [place]. Radiology 2010; [phone]. 2. Status post hysterectomy. Electronically Signed   By: Vinnie Langton M.D.   On: 01/24/2016 14:13   Ct Abdomen Pelvis W Contrast  Result Date: 01/24/2016 CLINICAL DATA:  82 year old female with history of constipation, abdominal distention and discomfort for the past 2 days. Last bowel movement was on 01/22/2016. EXAM: CT ABDOMEN AND PELVIS WITH CONTRAST TECHNIQUE: Multidetector CT imaging of the abdomen and pelvis  was performed using the standard protocol following bolus administration of intravenous contrast. CONTRAST:  156m ISOVUE-300 IOPAMIDOL (ISOVUE-300) INJECTION 61% COMPARISON:  No priors. FINDINGS: Lower chest: Mild scarring in the visualize lung bases bilaterally. Small hiatal hernia. Hepatobiliary: No cystic or solid hepatic lesions. No intra or extrahepatic biliary ductal dilatation. Gallbladder is unremarkable in appearance. Pancreas: No pancreatic mass. No pancreatic ductal dilatation. No pancreatic or peripancreatic fluid or inflammatory changes. Spleen: Unremarkable. Adrenals/Urinary Tract: Bilateral adrenal glands and the right kidney are normal in appearance. Sub cm low-attenuation lesion in the lower pole of the left kidney is too small to definitively characterize, but is statistically likely a tiny cyst. No hydroureteronephrosis. Urinary bladder is normal in appearance. Stomach/Bowel: Stomach is unremarkable in appearance. No pathologic dilatation of small bowel or colon. Numerous colonic diverticulae are noted, particularly in the sigmoid colon, without surrounding inflammatory changes to suggest an acute diverticulitis at this time. Stool burden does not appear excessive. Normal appendix. Vascular/Lymphatic: Aortic atherosclerosis, without evidence of aneurysm in the abdominal or  pelvic vasculature. No lymphadenopathy noted in the abdomen or pelvis. Reproductive: Status post hysterectomy. Right ovary is atrophic. In the left ovary there is a 4.2 x 3.5 x 4.0 cm low-attenuation lesion. Other: No significant volume of ascites.  No pneumoperitoneum. Musculoskeletal: Levoscoliosis of the thoracolumbar spine. There are no aggressive appearing lytic or blastic lesions noted in the visualized portions of the skeleton. IMPRESSION: 1. No acute findings in the abdomen or pelvis. 2. Colonic diverticulosis without evidence of acute diverticulitis at this time. 3. Normal appendix. 4. **An incidental finding of potential clinical significance has been found. 4.2 x 3.5 x 4.0 cm low-attenuation lesion in the left ovary, presumably a cyst. Further evaluation with nonemergent pelvic ultrasound is recommended in the near future for definitive characterization of this lesion in this postmenopausal female. This recommendation follows ACR consensus guidelines: White Paper of the ACR Incidental Findings Committee II on Adnexal Findings. J Am Coll Radiol [phone]** 5. Aortic atherosclerosis. 6. Additional incidental findings, as above. Electronically Signed   By: DVinnie LangtonM.D.   On: 01/24/2016 12:17    Procedures Procedures (including critical care time)  Medications Ordered in ED Medications  iopamidol (ISOVUE-300) 61 % injection 100 mL (100 mLs Intravenous Contrast Given 01/24/16 1143)     Initial Impression / Assessment and Plan / ED Course  I have reviewed the triage vital signs and the nursing notes.  Pertinent labs & imaging results that were available during my care of the patient were reviewed by me and considered in my medical decision making (see chart for details).  Clinical Course    Final Clinical Impressions(s) / ED Diagnoses   Final diagnoses:  Cyst of left ovary  Constipation, unspecified constipation type   Labs:CMP, CBC, urinalysis  Imaging: CT abdomen  pelvis with contrast, ultrasound pelvis complete  Consults:  Therapeutics:  Discharge Meds: MiraLAX  Assessment/Plan:   82year old female presents today with complaints of abdominal distention and constipation. She is afebrile nontoxic in no acute distress. She has no abdominal tenderness on my exam. CT scan showed no significant bowel obstruction or intra-abdominal abnormality that would indicate cause for patient's distention. She has very reassuring laboratory analysis. Incidentally there was a ovarian cyst that required follow-up with ultrasound. Ultrasound showed likely benign etiology with recommendation of one year follow-up. Patient will be instructed to use MiraLAX as needed for constipation, follow-up with primary care, follow up with OB/GYN. Patient's daughter was given discharge paperwork including imaging studies for  appropriate follow-up. Strict return precautions given, patient verbalized understanding and agreement to today's plan had no further questions concerns at time of discharge.  Patient care shared with Dr. Ashok Cordia who personally evaluated the patient and agreed to today's's assessment and plan.   New Prescriptions Discharge Medication List as of 01/24/2016  2:32 PM    START taking these medications   Details  polyethylene glycol (MIRALAX) packet Take 17 g by mouth daily., Starting Wed 01/24/2016, Print         [employer], PA-C 01/24/16 [place], MD 01/25/16 [phone]

## 2016-01-30 DIAGNOSIS — Z85820 Personal history of malignant melanoma of skin: Secondary | ICD-10-CM | POA: Diagnosis not present

## 2016-01-30 DIAGNOSIS — C4491 Basal cell carcinoma of skin, unspecified: Secondary | ICD-10-CM | POA: Diagnosis not present

## 2016-01-30 DIAGNOSIS — Z9889 Other specified postprocedural states: Secondary | ICD-10-CM | POA: Diagnosis not present

## 2016-01-30 DIAGNOSIS — C44319 Basal cell carcinoma of skin of other parts of face: Secondary | ICD-10-CM | POA: Diagnosis not present

## 2016-02-02 ENCOUNTER — Encounter: Payer: Self-pay | Admitting: *Deleted

## 2016-02-05 ENCOUNTER — Ambulatory Visit (INDEPENDENT_AMBULATORY_CARE_PROVIDER_SITE_OTHER): Payer: Medicare Other | Admitting: Physician Assistant

## 2016-02-05 ENCOUNTER — Encounter: Payer: Self-pay | Admitting: Physician Assistant

## 2016-02-05 VITALS — BP 118/78 | HR 63 | Ht 62.0 in | Wt 126.2 lb

## 2016-02-05 DIAGNOSIS — E039 Hypothyroidism, unspecified: Secondary | ICD-10-CM

## 2016-02-05 DIAGNOSIS — E785 Hyperlipidemia, unspecified: Secondary | ICD-10-CM

## 2016-02-05 DIAGNOSIS — I10 Essential (primary) hypertension: Secondary | ICD-10-CM | POA: Diagnosis not present

## 2016-02-05 DIAGNOSIS — I350 Nonrheumatic aortic (valve) stenosis: Secondary | ICD-10-CM | POA: Diagnosis not present

## 2016-02-05 NOTE — Patient Instructions (Addendum)
Medication Instructions:  Your physician recommends that you continue on your current medications as directed. Please refer to the Current Medication list given to you today.  Labwork: Your physician recommends that you return for lab work WITH PCP. SEND OUR OFFICE A COPY ONCE COMPLETE.  Testing/Procedures: None Ordered  Follow-Up: Your physician wants you to follow-up in: 68 MONTHS with DR Martinique. You will receive a reminder letter in the mail two months in advance. If you don't receive a letter, please call our office to schedule the follow-up appointment.  Any Other Special Instructions Will Be Listed Below (If Applicable).     If you need a refill on your cardiac medications before your next appointment, please call your pharmacy.

## 2016-02-05 NOTE — Progress Notes (Signed)
Cardiology Office Note    Date:  02/05/2016   ID:  Natalie Lam, DOB Mar 25, 1932, MRN [id number]  PCP:  Hollace Kinnier, DO  Cardiologist:  Dr. Martinique   Chief Complaint  Patient presents with  . Follow-up    seen for Dr. Martinique, annual visit    History of Present Illness:  Natalie Lam is a 82 y.o. female with PMH of hyperlipidemia, hypothyroidism, Gilbert syndrome, history of rheumatic fever, osteoporosis, mild aortic stenosis and hypertension. She is the mother-in-law of Dr. Darryl Nestle Molpus. In March 2016, she was admitted with a near-syncope episode. She states her legs suddenly gave away and she slumped down hitting her lips on the car. She did not think she truly passed out. She was not taking her blood pressure medication regularly and on admission her blood pressure was very high. Her blood pressure was resumed and the BP normalized. Otherwise other evaluations in the hospital did not reveal acute pathology. Echocardiogram showed mild AS with normal LV function. No arrhythmia was seen. She was seen back in clinic on 10/06/2014 at which time she was asymptomatic, it was felt that her aortic stenosis was unlikely to have caused any symptoms. Her presyncope was attributed to uncontrolled blood pressure.  She was most recently seen in the emergency room on 01/24/2016 for constipation. CT scan showed no significant bowel obstruction or intra-abdominal abnormality. She was prescribed Miralax and was subsequently discharged. Patient presented to cartilage office for follow-up accompanied by her daughter. She says Miralax has not been working for her and she is currently trying Linzess. In the past year, she denies any significant dizziness. She continued to be active at [facility] facility. Her blood pressure is being checked twice over the day, she says her blood pressures typically run in the 110 to 120s range. She also denies any recurrence of dizziness, presyncope, chest  discomfort, shortness breath. She does occasionally have lower extremity edema and required a dose of Lasix close to half a year ago, however has not needed any Lasix since. She is on hydrochlorothiazide and also TED hose which has been controlling the swelling.     Past Medical History:  Diagnosis Date  . Aortic stenosis, mild   . Gilbert syndrome   . HTN (hypertension)   . Hyperlipidemia   . Hypothyroidism   . Melanoma ([place])   . Neuropathy ([place])   . Osteoporosis   . Parkinson's disease ([place])   . Rheumatic fever    does not take Flu shot     Past Surgical History:  Procedure Laterality Date  . ABDOMINAL HYSTERECTOMY  1963   no BSO; no abnormal PAPs  . COLONOSCOPY  2000   negative;   . [place]  . MELANOMA EXCISION  08/2014    Current Medications: Outpatient Medications Prior to Visit  Medication Sig Dispense Refill  . bisacodyl (BISACODYL) 5 MG EC tablet Take 5 mg by mouth daily as needed for moderate constipation.    . Calcium Carbonate-Vitamin D (CALCIUM 600/VITAMIN D PO) Take 1 tablet by mouth daily.     . carbidopa-levodopa (SINEMET IR) 25-100 MG tablet Take 1 tablet by mouth 3 (three) times daily.   0  . Cholecalciferol (VITAMIN D) 2000 UNITS CAPS Take 1 capsule (2,000 Units total) by mouth daily. 30 capsule 3  . docusate sodium (COLACE) 100 MG capsule Take 100 mg by mouth daily. For constipation, hold for loose stool.    . gabapentin (NEURONTIN) 100  MG capsule Take 200 mg by mouth 2 (two) times daily.     . hydrochlorothiazide (HYDRODIURIL) 25 MG tablet Take 25 mg by mouth daily.    Marland Kitchen levothyroxine (SYNTHROID, LEVOTHROID) 50 MCG tablet Take one on Sunday, Monday, Wednesday, and Friday    . levothyroxine (SYNTHROID, LEVOTHROID) 75 MCG tablet Take one on Tuesday, Thursday, and Saturday.  0  . losartan (COZAAR) 50 MG tablet Take 50 mg by mouth 2 (two) times daily.    . Multiple Vitamin (MULTIVITAMIN) capsule Take 1 capsule by mouth daily.      Marland Kitchen  omeprazole (PRILOSEC) 20 MG capsule Take 20 mg by mouth daily.   1  . pravastatin (PRAVACHOL) 20 MG tablet TAKE 1 BY MOUTH DAILY 90 tablet 1  . Scar Treatment Products ([place] SPF 30 EX) Apply topically.    . polyethylene glycol (MIRALAX) packet Take 17 g by mouth daily. (Patient not taking: Reported on 02/05/2016) 14 each 0   No facility-administered medications prior to visit.      Allergies:   Sulfonamide derivatives and Sulfa antibiotics   Social History   Social History  . Marital status: Widowed    Spouse name: N/A  . Number of children: N/A  . Years of education: N/A   Occupational History  . retired [employer]  . Smoking status: Never Smoker  . Smokeless tobacco: Never Used  . Alcohol use No  . Drug use: No  . Sexual activity: Not Asked   Other Topics Concern  . None   Social History Narrative   Lives at [place] 01/05/2015   Widowed   Never smoked   Exercise walking   Alcohol none   POA/Living Will           Family History:  The patient's family history includes Cancer in her son; Dementia in her mother; Heart disease in her brother; Lung cancer in her maternal uncle; Pancreatic cancer in her son; Stroke in her mother.   ROS:   Please see the history of present illness.    ROS All other systems reviewed and are negative.   PHYSICAL EXAM:   VS:  BP 118/78   Pulse 63   Ht 5\' 2"  (1.575 m)   Wt 126 lb 3.2 oz (57.2 kg)   BMI 23.08 kg/m    GEN: Well nourished, well developed, in no acute distress  HEENT: normal  Neck: no JVD, carotid bruits, or masses Cardiac: RRR; no rubs, or gallops,no edema  [id number] systolic murmur at RUSB Respiratory:  clear to auscultation bilaterally, normal work of breathing GI: soft, nontender, nondistended, + BS MS: no deformity or atrophy  Skin: warm and dry, no rash Neuro:  Alert and Oriented x 3, Strength and sensation are intact Psych: euthymic mood, full  affect  Wt Readings from Last 3 Encounters:  02/05/16 126 lb 3.2 oz (57.2 kg)  01/24/16 126 lb (57.2 kg)  01/23/16 126 lb (57.2 kg)      Studies/Labs Reviewed:   EKG:  EKG is ordered today.  The ekg ordered today demonstrates NSR with poor R wave progression, unchanged from previous EKG  Recent Labs: 12/22/2015: TSH 2.47 01/24/2016: ALT 5; BUN 22; Creatinine, Ser 0.89; Hemoglobin 14.3; Platelets 130; Potassium 3.5; Sodium 137   Lipid Panel    Component Value Date/Time   CHOL 199 08/12/2014 0308   TRIG 38 08/12/2014 0308   TRIG 39 04/30/2006 1135   HDL 87  08/12/2014 0308   CHOLHDL 2.3 08/12/2014 0308   VLDL 8 08/12/2014 0308   LDLCALC 104 (H) 08/12/2014 0308   LDLDIRECT 105.9 06/15/2013 0839    Additional studies/ records that were reviewed today include:   Echo 08/12/2014 LV EF: 55% -  60%  ------------------------------------------------------------------- Indications:   Syncope 780.2.  ------------------------------------------------------------------- History:  PMH:  Murmur. Risk factors: Hypertension. Dyslipidemia.  ------------------------------------------------------------------- Study Conclusions  - Left ventricle: The cavity size was normal. Wall thickness was increased in a pattern of mild LVH. There was mild focal basal hypertrophy of the septum. Systolic function was normal. The estimated ejection fraction was in the range of 55% to 60%. - Aortic valve: There was mild stenosis. Valve area (Vmax): 1.53 cm^2. - Atrial septum: No defect or patent foramen ovale was identified.    Carotid doppler 09/06/2014    ASSESSMENT:    1. Essential hypertension   2. Hyperlipidemia   3. Mild aortic stenosis   4. Hypothyroidism, unspecified hypothyroidism type      PLAN:  In order of problems listed above:  1. HTN: Very well-controlled, no further symptoms of presyncope since her event in early 2015. Previous echocardiogram does show mild  LVH, however her blood pressure has been very well-managed since then.  2. HLD: Advised obtain lipid panel by PCP, last lipid panel was in early 2016. Recent liver function test was stable.  3. Mild aortic stenosis: Noticeable murmur on physical exam, previous echo revealed. Would continue to observe for now. No symptom of heart failure on physical physical exam.  4. Hypothyroidism: recent TSH normal on 12/22/2015, followed by PCP    Medication Adjustments/Labs and Tests Ordered: Current medicines are reviewed at length with the patient today.  Concerns regarding medicines are outlined above.  Medication changes, Labs and Tests ordered today are listed in the Patient Instructions below. Patient Instructions  Medication Instructions:  Your physician recommends that you continue on your current medications as directed. Please refer to the Current Medication list given to you today.  Labwork: Your physician recommends that you return for lab work WITH PCP. SEND OUR OFFICE A COPY ONCE COMPLETE.  Testing/Procedures: None Ordered  Follow-Up: Your physician wants you to follow-up in: 36 MONTHS with DR Martinique. You will receive a reminder letter in the mail two months in advance. If you don't receive a letter, please call our office to schedule the follow-up appointment.  Any Other Special Instructions Will Be Listed Below (If Applicable).     If you need a refill on your cardiac medications before your next appointment, please call your pharmacy.     Hilbert Corrigan, [place]  02/05/2016 3:34 PM    [facility] [place], [place], [place]  [address] Phone: [phone]; Fax: [phone]

## 2016-02-06 DIAGNOSIS — E785 Hyperlipidemia, unspecified: Secondary | ICD-10-CM | POA: Diagnosis not present

## 2016-02-12 DIAGNOSIS — E039 Hypothyroidism, unspecified: Secondary | ICD-10-CM | POA: Diagnosis not present

## 2016-02-12 DIAGNOSIS — E785 Hyperlipidemia, unspecified: Secondary | ICD-10-CM | POA: Diagnosis not present

## 2016-02-12 DIAGNOSIS — E559 Vitamin D deficiency, unspecified: Secondary | ICD-10-CM | POA: Diagnosis not present

## 2016-02-12 DIAGNOSIS — I10 Essential (primary) hypertension: Secondary | ICD-10-CM | POA: Diagnosis not present

## 2016-02-14 ENCOUNTER — Encounter: Payer: Self-pay | Admitting: Internal Medicine

## 2016-02-14 ENCOUNTER — Non-Acute Institutional Stay: Payer: Medicare Other | Admitting: Internal Medicine

## 2016-02-14 VITALS — BP 131/64 | HR 63 | Temp 98.7°F | Resp 18 | Ht 62.0 in | Wt 128.0 lb

## 2016-02-14 DIAGNOSIS — N83202 Unspecified ovarian cyst, left side: Secondary | ICD-10-CM | POA: Diagnosis not present

## 2016-02-14 DIAGNOSIS — G20 Parkinson's disease: Secondary | ICD-10-CM

## 2016-02-14 DIAGNOSIS — E038 Other specified hypothyroidism: Secondary | ICD-10-CM | POA: Diagnosis not present

## 2016-02-14 DIAGNOSIS — K5900 Constipation, unspecified: Secondary | ICD-10-CM | POA: Diagnosis not present

## 2016-02-14 DIAGNOSIS — G609 Hereditary and idiopathic neuropathy, unspecified: Secondary | ICD-10-CM

## 2016-02-14 DIAGNOSIS — G20A1 Parkinson's disease without dyskinesia, without mention of fluctuations: Secondary | ICD-10-CM

## 2016-02-14 DIAGNOSIS — E034 Atrophy of thyroid (acquired): Secondary | ICD-10-CM | POA: Diagnosis not present

## 2016-02-14 DIAGNOSIS — G3184 Mild cognitive impairment, so stated: Secondary | ICD-10-CM

## 2016-02-14 DIAGNOSIS — I872 Venous insufficiency (chronic) (peripheral): Secondary | ICD-10-CM | POA: Diagnosis not present

## 2016-02-14 NOTE — Progress Notes (Signed)
Location:  [facility] of Service:  Clinic (12)  Provider: Willine Schwalbe L. Mariea Clonts, D.O., C.M.D.  Code Status: DNR Goals of Care:  Advanced Directives 02/14/2016  Does patient have an advance directive? Yes  Type of Advance Directive Out of facility DNR (pink MOST or yellow form);[place];Living will  Does patient want to make changes to advanced directive? -  Copy of advanced directive(s) in chart? Yes  Would patient like information on creating an advanced directive? -  Pre-existing out of facility DNR order (yellow form or pink MOST form) Yellow form placed in chart (order not valid for inpatient use)     Chief Complaint  Patient presents with  . Medical Management of Chronic Issues    74mth follow-up    HPI: Patient is a 82 y.o. female seen today for medical management of chronic diseases.    Last time I saw her, she was having some constipation and wound up having a CT abdomen at the ED which showed an ovarian cyst.  Pt reports she actually had something on her ovary found back many years ago when she had her hysterectomy, but it was not removed then b/c it was thought to be benign.  She reports she's been walking more and using the miralax to move her bowels.  She says if she takes it, she has a bm 4 hrs later.  Requests miralax prn. (current regimen is colace 100mg  each am, miralax as needed and senokot s 2 at hs prn).  Says she has not needed the prn at hs.  Eats 3 prunes per day also.  Weight is stable.    Enjoys walking with her "wheels".  No falls.    She had some skin cancer on her face.  She just had a place on her left side of her forehead.  Her dermatologist ordered cream early this month for it.  Said not to be cancer.  Cream made it look bad at first and it's starting to look better.  Has a bandaid on it.  Follows up 9/28.    Feet and legs with neuropathy:  Sometimes bothers her when she's sleeping at night--when she gets  awake, the pain is gone.  She knows the medication "throws her back" for the next day.  Three middle toes of left foot and heels bother more the most.  Removing shoes also helps when they feel hot.  Takes the two gabapentin twice a day.  Swelling is well-controlled with compression hose and limiting salty breakfast meat.    Past Medical History:  Diagnosis Date  . Aortic stenosis, mild   . Gilbert syndrome   . HTN (hypertension)   . Hyperlipidemia   . Hypothyroidism   . Melanoma ([place])   . Neuropathy ([place])   . Osteoporosis   . Parkinson's disease ([place])   . Rheumatic fever    does not take Flu shot     Past Surgical History:  Procedure Laterality Date  . ABDOMINAL HYSTERECTOMY  1963   no BSO; no abnormal PAPs  . COLONOSCOPY  2000   negative; [place]  . [place]  . MELANOMA EXCISION  08/2014    Allergies  Allergen Reactions  . Sulfonamide Derivatives     REACTION:generalized  swelling  . Sulfa Antibiotics Swelling      Medication List       Accurate as of 02/14/16  9:26 AM. Always use your most recent med list.  CALCIUM 600/VITAMIN D PO Take 1 tablet by mouth daily.   carbidopa-levodopa 25-100 MG tablet Commonly known as:  SINEMET IR Take 1 tablet by mouth 3 (three) times daily.   docusate sodium 100 MG capsule Commonly known as:  COLACE Take 100 mg by mouth daily. For constipation, hold for loose stool.   gabapentin 100 MG capsule Commonly known as:  NEURONTIN Take 200 mg by mouth 2 (two) times daily.   levothyroxine 50 MCG tablet Commonly known as:  SYNTHROID, LEVOTHROID Take one on Sunday, Monday, Wednesday, and Friday   levothyroxine 75 MCG tablet Commonly known as:  SYNTHROID, LEVOTHROID Take one on Tuesday, Thursday, and Saturday.   losartan 50 MG tablet Commonly known as:  COZAAR Take 50 mg by mouth 2 (two) times daily.   [place] SPF 30 EX Apply topically.   multivitamin capsule Take 1 capsule by mouth daily.     omeprazole 20 MG capsule Commonly known as:  PRILOSEC Take 20 mg by mouth daily.   pravastatin 20 MG tablet Commonly known as:  PRAVACHOL TAKE 1 BY MOUTH DAILY   senna-docusate 8.6-50 MG tablet Commonly known as:  Senokot-S Take 2 tablets by mouth at bedtime as needed for mild constipation.   Vitamin D 2000 units Caps Take 1 capsule (2,000 Units total) by mouth daily.       Review of Systems:  Review of Systems  Constitutional: Positive for malaise/fatigue. Negative for chills and fever.  HENT: Negative for congestion.   Eyes: Negative for blurred vision.  Respiratory: Negative for cough and shortness of breath.   Cardiovascular: Negative for chest pain, palpitations and leg swelling.       Edema of feet controlled with compression hose and low sodium diet  Gastrointestinal: Positive for constipation. Negative for abdominal pain, blood in stool, diarrhea, heartburn, melena, nausea and vomiting.  Genitourinary: Negative for dysuria.  Musculoskeletal: Negative for falls, joint pain and myalgias.  Skin: Negative for itching and rash.       Area on left forehead being treated by derm  Neurological: Positive for tingling and sensory change. Negative for dizziness, loss of consciousness, weakness and headaches.       Parkinsonism  Endo/Heme/Allergies: Bruises/bleeds easily.  Psychiatric/Behavioral: Positive for memory loss. Negative for depression and suicidal ideas. The patient is not nervous/anxious and does not have insomnia.     Health Maintenance  Topic Date Due  . ZOSTAVAX  12/27/1993  . PNA vac Low Risk Adult (1 of 2 - PCV13) 12/28/1998  . INFLUENZA VACCINE  12/26/2015  . TETANUS/TDAP  08/10/2024  . DEXA SCAN  Completed    Physical Exam: Vitals:   02/14/16 0851  BP: 131/64  Pulse: 63  Resp: 18  Temp: 98.7 F (37.1 C)  TempSrc: Oral  SpO2: 95%  Weight: 128 lb (58.1 kg)  Height: 5\' 2"  (1.575 m)   Body mass index is 23.41 kg/m. Physical Exam   Constitutional: She is oriented to person, place, and time. She appears well-nourished. No distress.  HENT:  Head: Normocephalic and atraumatic.  Cardiovascular: Normal rate, regular rhythm, normal heart sounds and intact distal pulses.   Wearing her compression hose, no edema  Pulmonary/Chest: Effort normal and breath sounds normal.  Abdominal: Soft. Bowel sounds are normal. She exhibits distension. She exhibits no mass. There is no tenderness. There is no rebound and no guarding. No hernia.  Musculoskeletal: Normal range of motion. She exhibits no tenderness.  Shuffling, festinating gait with walker  Neurological: She is alert and  oriented to person, place, and time.  Skin: Skin is warm and dry.  Psychiatric: She has a normal mood and affect.    Labs reviewed: Basic Metabolic Panel:  Recent Labs  07/11/15 08/03/15 0524 08/22/15 10/03/15 12/22/15 01/24/16 1105  NA 145 137  --   --   --  137  K 3.9 4.6  --   --   --  3.5  CL  --   --   --   --   --  100*  CO2  --   --   --   --   --  31  GLUCOSE  --   --   --   --   --  89  BUN 24* 13  --   --   --  22*  CREATININE 0.9 0.9  --   --   --  0.89  CALCIUM  --   --   --   --   --  9.2  TSH 0.11*  --  5.36 2.77 2.47  --    Liver Function Tests:  Recent Labs  07/11/15 01/24/16 1105  AST 23 23  ALT 17 5*  ALKPHOS 31 32*  BILITOT  --  1.5*  PROT  --  6.3*  ALBUMIN  --  3.7   No results for input(s): LIPASE, AMYLASE in the last 8760 hours. No results for input(s): AMMONIA in the last 8760 hours. CBC:  Recent Labs  07/11/15 08/03/15 0524 01/24/16 1105  WBC 4.5 6.8 4.4  NEUTROABS  --   --  2.8  HGB 14.2 12.4 14.3  HCT 42 37 42.9  MCV  --   --  97.5  PLT 162 238 130*   Lipid Panel: No results for input(s): CHOL, HDL, LDLCALC, TRIG, CHOLHDL, LDLDIRECT in the last 8760 hours. No results found for: HGBA1C  Procedures since last visit: US Transvaginal Non-ob  Result Date: 01/24/2016 CLINICAL DATA:  82 year old female  with cystic lesion of the left adnexal region noted on recent CT examination. Followup study. EXAM: TRANSABDOMINAL AND TRANSVAGINAL ULTRASOUND OF PELVIS TECHNIQUE: Both transabdominal and transvaginal ultrasound examinations of the pelvis were performed. Transabdominal technique was performed for global imaging of the pelvis including uterus, ovaries, adnexal regions, and pelvic cul-de-sac. It was necessary to proceed with endovaginal exam following the transabdominal exam to visualize the ovaries. COMPARISON:  No prior.  CT the abdomen and pelvis 01/24/2016. FINDINGS: Uterus Status post hysterectomy. Right ovary Measurements: 2.5 x 1.3 x 1.4 cm. Normal appearance/no adnexal mass. Left ovary Measurements: 5.1 x 3.6 x 4.7 cm. There is a single anechoic lesion with increased through transmission measuring 3.5 x 3.3 x 3.3 cm. This lesion demonstrates no mural nodularity, internal septations or internal blood flow. Other findings No abnormal free fluid. IMPRESSION: 1. Large simple cyst in the left ovary has a benign appearance. Repeat pelvic ultrasound is recommended in 1 year to ensure the stability of this finding. This recommendation follows the consensus statement: Management of Asymptomatic Ovarian and Other Adnexal Cysts Imaged at [place]: Society of Radiologists in [place]. Radiology 2010; [phone]. 2. Status post hysterectomy. Electronically Signed   By: Vinnie Langton M.D.   On: 01/24/2016 14:13   US Pelvis Complete  Result Date: 01/24/2016 CLINICAL DATA:  82 year old female with cystic lesion of the left adnexal region noted on recent CT examination. Followup study. EXAM: TRANSABDOMINAL AND TRANSVAGINAL ULTRASOUND OF PELVIS TECHNIQUE: Both transabdominal and transvaginal ultrasound examinations of the pelvis were performed.  Transabdominal technique was performed for global imaging of the pelvis including uterus, ovaries, adnexal regions, and pelvic cul-de-sac. It was  necessary to proceed with endovaginal exam following the transabdominal exam to visualize the ovaries. COMPARISON:  No prior.  CT the abdomen and pelvis 01/24/2016. FINDINGS: Uterus Status post hysterectomy. Right ovary Measurements: 2.5 x 1.3 x 1.4 cm. Normal appearance/no adnexal mass. Left ovary Measurements: 5.1 x 3.6 x 4.7 cm. There is a single anechoic lesion with increased through transmission measuring 3.5 x 3.3 x 3.3 cm. This lesion demonstrates no mural nodularity, internal septations or internal blood flow. Other findings No abnormal free fluid. IMPRESSION: 1. Large simple cyst in the left ovary has a benign appearance. Repeat pelvic ultrasound is recommended in 1 year to ensure the stability of this finding. This recommendation follows the consensus statement: Management of Asymptomatic Ovarian and Other Adnexal Cysts Imaged at [place]: Society of Radiologists in [place]. Radiology 2010; [id number]. 2. Status post hysterectomy. Electronically Signed   By: Vinnie Langton M.D.   On: 01/24/2016 14:13   Ct Abdomen Pelvis W Contrast  Result Date: 01/24/2016 CLINICAL DATA:  82 year old female with history of constipation, abdominal distention and discomfort for the past 2 days. Last bowel movement was on 01/22/2016. EXAM: CT ABDOMEN AND PELVIS WITH CONTRAST TECHNIQUE: Multidetector CT imaging of the abdomen and pelvis was performed using the standard protocol following bolus administration of intravenous contrast. CONTRAST:  188mL ISOVUE-300 IOPAMIDOL (ISOVUE-300) INJECTION 61% COMPARISON:  No priors. FINDINGS: Lower chest: Mild scarring in the visualize lung bases bilaterally. Small hiatal hernia. Hepatobiliary: No cystic or solid hepatic lesions. No intra or extrahepatic biliary ductal dilatation. Gallbladder is unremarkable in appearance. Pancreas: No pancreatic mass. No pancreatic ductal dilatation. No pancreatic or peripancreatic fluid or inflammatory changes. Spleen:  Unremarkable. Adrenals/Urinary Tract: Bilateral adrenal glands and the right kidney are normal in appearance. Sub cm low-attenuation lesion in the lower pole of the left kidney is too small to definitively characterize, but is statistically likely a tiny cyst. No hydroureteronephrosis. Urinary bladder is normal in appearance. Stomach/Bowel: Stomach is unremarkable in appearance. No pathologic dilatation of small bowel or colon. Numerous colonic diverticulae are noted, particularly in the sigmoid colon, without surrounding inflammatory changes to suggest an acute diverticulitis at this time. Stool burden does not appear excessive. Normal appendix. Vascular/Lymphatic: Aortic atherosclerosis, without evidence of aneurysm in the abdominal or pelvic vasculature. No lymphadenopathy noted in the abdomen or pelvis. Reproductive: Status post hysterectomy. Right ovary is atrophic. In the left ovary there is a 4.2 x 3.5 x 4.0 cm low-attenuation lesion. Other: No significant volume of ascites.  No pneumoperitoneum. Musculoskeletal: Levoscoliosis of the thoracolumbar spine. There are no aggressive appearing lytic or blastic lesions noted in the visualized portions of the skeleton. IMPRESSION: 1. No acute findings in the abdomen or pelvis. 2. Colonic diverticulosis without evidence of acute diverticulitis at this time. 3. Normal appendix. 4. **An incidental finding of potential clinical significance has been found. 4.2 x 3.5 x 4.0 cm low-attenuation lesion in the left ovary, presumably a cyst. Further evaluation with nonemergent pelvic ultrasound is recommended in the near future for definitive characterization of this lesion in this postmenopausal female. This recommendation follows ACR consensus guidelines: White Paper of the ACR Incidental Findings Committee II on Adnexal Findings. J Am Coll Radiol [phone].** 5. Aortic atherosclerosis. 6. Additional incidental findings, as above. Electronically Signed   By: Vinnie Langton M.D.   On: 01/24/2016 12:17    Assessment/Plan 1. Constipation,  unspecified constipation type -pt requests daily as needed miralax be ordered -she has been using it recently with a bm regularly 4 hrs after a dose -also has a stool softener in the am (will change from colace to senokot s which seems to work better) -I never heard the results of the FOBT x 3 I ordered  2. Left ovarian cyst -noted on CT abd/pelvis and confirmed on [place] -cont to monitor--pelvic US repeat recommended in 1 year to ensure stability -upon speaking with the resident, she reports having had something on her ovary back at the time of her hysterectomy and there was a decision to leave it alone then.  3. Parkinson's disease ([place]) -cont on sinemet low dose, has helped, no recent falling, cont use of walker at all times, does have festinating gait  4. Hereditary and idiopathic peripheral neuropathy -this is stable, continue her gabapentin and monitor  5. Mild cognitive impairment with memory loss -seems to be stable also, lives in [place] and doing well there  6. Hypothyroidism due to acquired atrophy of thyroid -cont alternating dose synthroid  7. Chronic venous insufficiency -stable with compression hose  Labs/tests ordered:  No orders of the defined types were placed in this encounter.   Next appt:  4 mos annual wellness  Kenyah Luba L. Elyce Zollinger, D.O. [facility] [address],  [address] Cell Phone (Mon-Fri 8am-5pm):  [phone] On Call:  [phone] & follow prompts after 5pm & weekends Office Phone:  [phone] Office Fax:  [phone]

## 2016-02-23 ENCOUNTER — Telehealth: Payer: Self-pay

## 2016-02-23 NOTE — Telephone Encounter (Signed)
Message left on triage voicemail: Amitiza approved through 02/22/17.   Amitiza is not on patient's active medication list. Please advise if patient should be on this medication

## 2016-02-23 NOTE — Progress Notes (Signed)
Natalie Lam was seen today in the movement disorders clinic for neurologic consultation at the request of [name], TIFFANY, DO.  The patient is accompanied by her daughter who supplements the history.  Prior records made available to me were reviewed.  The patient has a history of gait instability.  Pts sx's started in March, 2016.  Pt thinks that it started quickly.  She remembers one particular day in March that she went to [facility] and she backed into the space and she realized that she tried to walk into [employer] and just felt that she would fall if she didn't get back in the car.  She had a near syncopal episode which was later determined due to HTN and mild AS.  After that, she seemed to go slowly down hill.  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  She had PT in May which helped her back but didn't really seem to help the gait.  Pt is getting ready to move to [facility] assisted living.  Pt states that she just has a "fear of falling because so many people my age do."  Admits that depression contributes to sx's but states that doing better than she was.  Pt denies lateralizing weakness/paresthesias.  Denies speech change but states that she "tires easily."  Denies feet paresthesias.      Specific Symptoms:  Tremor: No. Voice: denies hypophonic speech Sleep: sleeps well  Vivid Dreams:  Yes.   (only if eats at night)  Acting out dreams:  No. Wet Pillows: No. Postural symptoms:  Yes.   (no cane/walker to ambulate)  Falls?  No. Bradykinesia symptoms: slow movements Loss of smell:  No. Loss of taste:  No. Urinary Incontinence:  No. Difficulty Swallowing:  No. Handwriting, micrographia: No. (perhaps a little smaller) Trouble with ADL's:  No.  Trouble buttoning clothing: No. Depression:  Yes.   (multiple deaths in family) Memory changes:  Yes.   (but complicated by  deaths, moving, closing family business; still drives in familiar territory and parks far away to avoid other cars; pays monthly bills without issue; cooks for self until moves to [facility]) Hallucinations:  No.  visual distortions: No. N/V:  No. Lightheaded:  No.   Neuroimaging has previously been performed.  It is  available for my review today.  A CT of the brain was performed on 08/12/2014 after near syncopal episode.  I reviewed this.  There is atrophy and rather significant cervical small vessel disease.  A carotid ultrasound was performed on 09/06/2014.  This demonstrated 1-49% stenosis bilaterally.  09/13/15 update:  The patient is following up today, accompanied by her daughter who supplements the history.  I have not seen her since July, 2016 and that was only a one time visit.  She does have a history of peripheral neuropathy, which has led to gait instability.  Her peripheral neuropathy has also caused paresthesias in the feet and according to records she was started on gabapentin by the nurse practitioner at [facility] in October, 2016.  She states that she is f/u due to trouble walking for about 6 weeks.  Her daughter is a Marine scientist and agrees that the majority of the issue started about 6 weeks ago, with no new med changes.  Pt states that she is getting stuck in doorways.  "Once I get going, I am okay."  11/24/15 update:  Pt is  f/u today re: newly dx PD last visit. This patient is accompanied in the office by her child who supplements the history.  Reviewed records since last visit.  On levodopa tid now.  Tolerating it well.  Feels that she is doing much better.  States that she feels stronger in the legs and "I don't have to work to get across the doorway."  No hallucinations.  No lightheaded/near syncope.  No falls since last visit.  Did PT/OT at [facility] and feels it helped.  Uses rollator.    02/26/16 update:  The patient follows up regarding her Parkinson's disease.  She remains on  carbidopa/levodopa 25/100, one tablet 3 times per day.  She reports that she has been doing well.  No falls.  No lightheadedness or near syncope.  She is using her Rollator faithfully but "with my wheels I can travel."  She did go to the emergency room with abdominal pain, which was ultimately felt due to constipation.  She is taking MiraLAX but doesn't use it daily.  She is still having trouble balancing constipation with diarrhea.  Daughter that Baker Pierini was started almost a month ago.  She is on 68mg once per day.  Because of intermittent diarrhea, she has cut back on exercise some.  Lives in assisted living and gets assist with medication, bath, dressing (putting on hose especially).  Meals prepared.  [place]     ALLERGIES:   Allergies  Allergen Reactions  . Sulfonamide Derivatives     REACTION:generalized  swelling  . Sulfa Antibiotics Swelling    CURRENT MEDICATIONS:  Outpatient Encounter Prescriptions as of 02/26/2016  Medication Sig  . AMITIZA 24 MCG capsule   . Calcium Carbonate-Vitamin D (CALCIUM 600/VITAMIN D PO) Take 1 tablet by mouth daily.   . carbidopa-levodopa (SINEMET IR) 25-100 MG tablet Take 1 tablet by mouth 3 (three) times daily.   . Cholecalciferol (VITAMIN D) 2000 UNITS CAPS Take 1 capsule (2,000 Units total) by mouth daily.  .Marland Kitchengabapentin (NEURONTIN) 100 MG capsule Take 200 mg by mouth 2 (two) times daily.   .Marland Kitchenlevothyroxine (SYNTHROID, LEVOTHROID) 50 MCG tablet Take one on Sunday, Monday, Wednesday, and Friday  . levothyroxine (SYNTHROID, LEVOTHROID) 75 MCG tablet Take one on Tuesday, Thursday, and Saturday.  . losartan (COZAAR) 50 MG tablet Take 50 mg by mouth 2 (two) times daily.  . Multiple Vitamin (MULTIVITAMIN) capsule Take 1 capsule by mouth daily.    .Marland Kitchenomeprazole (PRILOSEC) 20 MG capsule Take 20 mg by mouth daily.   . polyethylene glycol powder (GLYCOLAX/MIRALAX) powder   . pravastatin (PRAVACHOL) 20 MG tablet TAKE 1 BY MOUTH DAILY  . Scar  Treatment Products ([place]SPF 30 EX) Apply topically.  . senna-docusate (SENOKOT-S) 8.6-50 MG tablet Take 2 tablets by mouth daily.   No facility-administered encounter medications on file as of 02/26/2016.     PAST MEDICAL HISTORY:   Past Medical History:  Diagnosis Date  . Aortic stenosis, mild   . Gilbert syndrome   . HTN (hypertension)   . Hyperlipidemia   . Hypothyroidism   . Melanoma ([place]   . Neuropathy ([facility]   . Osteoporosis   . Parkinson's disease ([facility]   . Rheumatic fever    does not take Flu shot     PAST SURGICAL HISTORY:   Past Surgical History:  Procedure Laterality Date  . ABDOMINAL HYSTERECTOMY  1963   no BSO; no abnormal PAPs  . COLONOSCOPY  2000   negative; [place]  .  [facility]  . MELANOMA EXCISION  08/2014    SOCIAL HISTORY:   Social History   Social History  . Marital status: Widowed    Spouse name: N/A  . Number of children: N/A  . Years of education: N/A   Occupational History  . retired own [employer]  . Smoking status: Never Smoker  . Smokeless tobacco: Never Used  . Alcohol use No  . Drug use: No  . Sexual activity: Not on file   Other Topics Concern  . Not on file   Social History Narrative   Lives at [facility] 01/05/2015   Widowed   Never smoked   Exercise walking   Alcohol none   POA/Living Will          FAMILY HISTORY:   Family Status  Relation Status  . Mother Deceased   alzheimers, stroke  . Son Deceased   multiple myeloma  . Son Deceased   pancreatic cancer  . Father Deceased   unknown  . Brother Alive  . Brother Deceased  . Sister Alive  . Sister Alive  . Daughter Alive  . Daughter Deceased   decapitated in [place]  . Maternal Uncle   . Neg Hx     ROS:  Some LE edema.  A complete 10 system review of systems was obtained and was unremarkable apart from what is mentioned above.  PHYSICAL EXAMINATION:    VITALS:   Vitals:   02/26/16  1059  BP: 128/68  Pulse: 75  Weight: 129 lb (58.5 kg)  Height: 5' 2.5" (1.588 m)    GEN:  The patient appears stated age and is in NAD. HEENT:  Normocephalic, atraumatic.  The mucous membranes are moist. The superficial temporal arteries are without ropiness or tenderness. CV:  RRR with 3/6 SEM Lungs:  CTAB Neck/HEME:  There are no carotid bruits bilaterally.  Neurological examination:  Orientation: Had trouble with trail making.  Did well with orientation. Montreal Cognitive Assessment  12/13/2014  Visuospatial/ Executive (0/5) 3  Naming (0/3) 2  Attention: Read list of digits (0/2) 2  Attention: Read list of letters (0/1) 1  Attention: Serial 7 subtraction starting at 100 (0/3) 1  Language: Repeat phrase (0/2) 2  Language : Fluency (0/1) 1  Abstraction (0/2) 2  Delayed Recall (0/5) 0  Orientation (0/6) 6  Total 20  Adjusted Score (based on education) 21    Cranial nerves: There is good facial symmetry.  Extraocular muscles are intact. The visual fields are full to confrontational testing. The speech is fluent and clear. Soft palate rises symmetrically and there is no tongue deviation. Hearing is intact to conversational tone. Sensation: Sensation is intact to light touch throughout Motor: Strength is 5/5 in the bilateral upper and lower extremities.   Shoulder shrug is equal and symmetric.  There is no pronator drift.  Movement examination: Tone: There is normal tone bilaterally Abnormal movements: None Coordination:  There is decremation with finger taps, toe taps bilaterally and with heel taps on the L Gait and Station: The patient pushes off of the chair to get off.  She has start hesitation.  She does hesitate in the doorway.  She intermittently slows just for a second and momentarily freezes.  Labs:  Lab Results  Component Value Date   [id number] 08/11/2014   No results found for: HGBA1C    ASSESSMENT/PLAN:  1.  Parkinson's disease  -dx formally  in  08/2015  -We discussed the diagnosis as well as pathophysiology of the disease.  We discussed treatment options as well as prognostic indicators.  Patient education was provided.  -increase levodopa slightly carbidopa/levodopa 25/100 tid because of freezing/start hesitation.  Not sure if it will help this but going to try.    -doing walking but encouraged her to add stationary bike and work with the physical therapist on getting on/off the bike.  PT told her that she should not bike but if they have a recumbant bike at [facility], would love her to use that.  -she brought DNR orders and I put them in the system today.  -daughter emotionally very upset today as pt wants to leave [facility] and come home (pt did not let me know this).  Talked to patient about the fact that she needs to stay where she is as she is ultimately going to need step up care and [facility] is the perfect place for this.  Should not be living independently  2.  Melanoma  -discussed with the patient that PD increases risk of melanoma and just had one removed and sees [facility] dermatology.    3.  Constipation  -having trouble balancing this with diarrhea (had accident in our office)  -copy of the rancho recipe given  -using miralax and amitiza.  Per daughter request, decrease miralax dose to 1/2 capful instead of full  -pt needs to drink much more water.  Suspect that this is big part of etiology of constipation  4.  Follow up is anticipated in the next few months, sooner should new neurologic issues arise.  Much greater than 50% of this visit was spent in counseling and coordinating care.  Total face to face time:  40 min

## 2016-02-23 NOTE — Telephone Encounter (Signed)
Not that I'm aware of, but her daughter takes her out to other physicians sometimes.  The [facility] nurses would know for sure.

## 2016-02-26 ENCOUNTER — Ambulatory Visit (INDEPENDENT_AMBULATORY_CARE_PROVIDER_SITE_OTHER): Payer: Medicare Other | Admitting: Neurology

## 2016-02-26 ENCOUNTER — Telehealth: Payer: Self-pay | Admitting: General Practice

## 2016-02-26 ENCOUNTER — Telehealth: Payer: Self-pay | Admitting: Emergency Medicine

## 2016-02-26 ENCOUNTER — Encounter: Payer: Self-pay | Admitting: Neurology

## 2016-02-26 VITALS — BP 128/68 | HR 75 | Ht 62.5 in | Wt 129.0 lb

## 2016-02-26 DIAGNOSIS — G20 Parkinson's disease: Secondary | ICD-10-CM | POA: Diagnosis not present

## 2016-02-26 DIAGNOSIS — K5901 Slow transit constipation: Secondary | ICD-10-CM | POA: Diagnosis not present

## 2016-02-26 NOTE — Patient Instructions (Addendum)
We have called Dr. Virgil Benedict office. She will review your chart and they will give you a call with a new patient appt.   Constipation and Parkinson's disease:  1.Rancho recipe for constipation in Parkinsons Disease:  -1 cup of unprocessed bran (need to get this at [employer], [employer] or similar type of store), 2 cups of applesauce in 1 cup of prune juice 2.  Increase fiber intake (Metamucil,vegetables) 3.  Regular, moderate exercise can be beneficial. 4.  Avoid medications causing constipation, such as medications like antacids with calcium or magnesium 5.  Laxative overuse should be avoided. 6.  Stool softeners (Colace) can help with chronic constipation. 7.  Increase water intake.  You should be drinking 1/2 gallon of water a day as long as you have not been diagnosed with congestive heart failure or renal/kidney failure.  This is probably the single greatest thing that you can do to help your constipation.

## 2016-02-26 NOTE — Telephone Encounter (Signed)
Called pt and LMOVM to return call.  °

## 2016-02-26 NOTE — Telephone Encounter (Signed)
Dr. Doristine Devoid office called in regards and was wondering if you could accept patient?

## 2016-02-26 NOTE — Telephone Encounter (Signed)
Unfortunately, we don't have any new pt appts at this time.  She can be placed on a waiting list but this doesn't help w/ her acute issue.  I would recommend she see her current PCP and/or UC for evaluation

## 2016-02-26 NOTE — Telephone Encounter (Signed)
Can you please call and schedule pt a new patient appt

## 2016-02-26 NOTE — Telephone Encounter (Signed)
Yes- I will accept all referrals from specialists

## 2016-02-26 NOTE — Telephone Encounter (Signed)
Left detailed message on machine per daughter advising since we do not have any available NPT right now. Will keep appointment in Feb for NPT. Advised to see PCP or urgent care for the acute issue.

## 2016-02-26 NOTE — Telephone Encounter (Signed)
Patient daughter called to schedule a NPT appointment. She is scheduled for 07/10/16 NPT.  Patient is currently having diarrhea and patient daughter wanted her evaluated to make sure she did not have C-diff sooner than her NPT appointment.  I did advise the daughter if she could see Dr.[name] for evaluation for the diarrhea in the mean time but she did not feel confident in her treating the problem. Please advise

## 2016-02-27 ENCOUNTER — Non-Acute Institutional Stay: Payer: Medicare Other | Admitting: Internal Medicine

## 2016-02-27 DIAGNOSIS — G20A1 Parkinson's disease without dyskinesia, without mention of fluctuations: Secondary | ICD-10-CM

## 2016-02-27 DIAGNOSIS — R197 Diarrhea, unspecified: Secondary | ICD-10-CM

## 2016-02-27 DIAGNOSIS — N83202 Unspecified ovarian cyst, left side: Secondary | ICD-10-CM

## 2016-02-27 DIAGNOSIS — K5900 Constipation, unspecified: Secondary | ICD-10-CM

## 2016-02-27 DIAGNOSIS — G20 Parkinson's disease: Secondary | ICD-10-CM

## 2016-02-27 NOTE — Progress Notes (Signed)
Location:  Occupational psychologist of Service:  ALF (13) Provider:  Alleyah Twombly L. Mariea Clonts, D.O., C.M.D.  Hollace Kinnier, DO  Patient Care Team: Gayland Curry, DO as PCP - General (Geriatric Medicine) Jola Schmidt, MD as Consulting Physician (Ophthalmology) Ludwig Clarks, DO as Consulting Physician (Neurology) Peter M Martinique, MD as Consulting Physician (Cardiology)  Extended Emergency Contact Information Primary Emergency Contact: [name] Address: [address]          [name] Phone: [phone] Mobile Phone: [phone] Relation: Daughter  Code Status:  DNR Goals of care: Advanced Directive information Advanced Directives 02/14/2016  Does patient have an advance directive? Yes  Type of Advance Directive Out of facility DNR (pink MOST or yellow form);[place];Living will  Does patient want to make changes to advanced directive? -  Copy of advanced directive(s) in chart? Yes  Would patient like information on creating an advanced directive? -  Pre-existing out of facility DNR order (yellow form or pink MOST form) Yellow form placed in chart (order not valid for inpatient use)     Chief Complaint  Patient presents with  . Acute Visit    diarrhea 4 x, 1x at the doctor's office, no bms today, 10/1 had prn miralax, refusing senokot, but is on amitiza    HPI:  Natalie Lam is a 82 y.o. female seen today for an acute visit for diarrhea.  Staff had just been notified that this was ongoing and were about to come tell me when I received a call from my office manager saying that the patient's daughter had called the office very upset and wanting her mother's condition addressed.  She had already contacted her husband, an ED physician, requesting the orders to treat her mother.  I was taken aback, not having been told anything about a problem prior to that to allow me to address it.  Apparently, family had also asked  the neurologist to address her bowels and had tried to contact a new PCP to address this issue before I was even informed it was going on.  She reports that the loose stool has been going on for the past 4 days, starting on Saturday.  It's been watery uncontrollable diarrhea--not particularly foul smelling (I didn't smell it in her apt either).  No abdominal pain, nausea, vomiting, blood seen.  No fever, chills.  She is trying to hydrate and was eating regularly except breakfast this morning which she skipped.  The loose stools would happen once as a large amt, then some small amts afterwards.  She did have incontinence at least twice and has been using depends with pads to be on the safe side.  She also says she's noticed having more loose uncontrollable bms since her Baker Pierini was started.    Of note, I was trying to streamline her regimen, so at the last clinic visit , I d/c colace and made all of her stools softeners senokot s.  She also has miralax as needed.  She has only been getting the amitiza since 10/1 when she had her last miralax dose.  Apparently, there was some misunderstanding that I recommended that she have surgery on this ovarian cyst that she's had for years (per Natalie Lam).  I never said that.  I mentioned that if it became an obstruction to her GI tract, that that could be necessary, but would never recommend surgery but as a last resort for anyone.  Past Medical History:  Diagnosis Date  . Aortic stenosis, mild   . Gilbert syndrome   . HTN (hypertension)   . Hyperlipidemia   . Hypothyroidism   . Melanoma ([place])   . Neuropathy ([place])   . Osteoporosis   . Parkinson's disease ([place])   . Rheumatic fever    does not take Flu shot    Past Surgical History:  Procedure Laterality Date  . ABDOMINAL HYSTERECTOMY  1963   no BSO; no abnormal PAPs  . COLONOSCOPY  2000   negative; [place]  . [place]  . MELANOMA EXCISION  08/2014    Allergies  Allergen Reactions  .  Sulfonamide Derivatives     REACTION:generalized  swelling  . Sulfa Antibiotics Swelling      Medication List       Accurate as of 02/27/16 11:59 PM. Always use your most recent med list.          AMITIZA 24 MCG capsule Generic drug:  lubiprostone   CALCIUM 600/VITAMIN D PO Take 1 tablet by mouth daily.   carbidopa-levodopa 25-100 MG tablet Commonly known as:  SINEMET IR Take 1 tablet by mouth 3 (three) times daily.   gabapentin 100 MG capsule Commonly known as:  NEURONTIN Take 200 mg by mouth 2 (two) times daily.   levothyroxine 50 MCG tablet Commonly known as:  SYNTHROID, LEVOTHROID Take one on Sunday, Monday, Wednesday, and Friday   levothyroxine 75 MCG tablet Commonly known as:  SYNTHROID, LEVOTHROID Take one on Tuesday, Thursday, and Saturday.   losartan 50 MG tablet Commonly known as:  COZAAR Take 50 mg by mouth 2 (two) times daily.   [place] SPF 30 EX Apply topically.   multivitamin capsule Take 1 capsule by mouth daily.   omeprazole 20 MG capsule Commonly known as:  PRILOSEC Take 20 mg by mouth daily.   polyethylene glycol powder powder Commonly known as:  GLYCOLAX/MIRALAX   pravastatin 20 MG tablet Commonly known as:  PRAVACHOL TAKE 1 BY MOUTH DAILY   senna-docusate 8.6-50 MG tablet Commonly known as:  Senokot-S Take 2 tablets by mouth daily.   Vitamin D 2000 units Caps Take 1 capsule (2,000 Units total) by mouth daily.       Review of Systems  Constitutional: Negative for chills, fever, malaise/fatigue and weight loss.  HENT: Positive for hearing loss.   Eyes: Negative for redness.  Respiratory: Negative for cough and shortness of breath.   Cardiovascular: Negative for chest pain, palpitations and leg swelling.       Wears compression hose  Gastrointestinal: Positive for diarrhea. Negative for abdominal pain, blood in stool, constipation, heartburn, melena, nausea and vomiting.  Genitourinary: Negative for dysuria.  Musculoskeletal:  Negative for back pain, falls, joint pain and myalgias.  Skin: Negative for itching and rash.  Neurological: Positive for tingling, tremors and sensory change. Negative for dizziness, speech change, focal weakness, loss of consciousness and weakness.  Endo/Heme/Allergies: Bruises/bleeds easily.  Psychiatric/Behavioral: Positive for memory loss. Negative for depression and hallucinations. The patient is nervous/anxious. The patient does not have insomnia.     Immunization History  Administered Date(s) Administered  . Tdap 08/11/2014   Pertinent  Health Maintenance Due  Topic Date Due  . PNA vac Low Risk Adult (1 of 2 - PCV13) 12/28/1998  . INFLUENZA VACCINE  12/26/2015  . DEXA SCAN  Completed   Fall Risk  02/26/2016 11/24/2015 10/11/2015 01/11/2015 08/18/2014  Falls in the past year? No No No Yes Yes  Number falls in past yr: - - - 2 or more 2 or more  Injury with Fall? - - - Yes No  Follow up - - - - Education provided   Functional Status Survey:    There were no vitals filed for this visit. There is no height or weight on file to calculate BMI. Physical Exam  Constitutional: She is oriented to person, place, and time. She appears well-developed and well-nourished. No distress.  Pleasant female sitting comfortably on her sofa in her AL apt  HENT:  Head: Normocephalic and atraumatic.  Cardiovascular: Normal rate and regular rhythm.   Pulmonary/Chest: Effort normal and breath sounds normal. No respiratory distress.  Abdominal: Soft. She exhibits distension. She exhibits no mass. There is no tenderness. There is no rebound and no guarding. No hernia.  Hyperactive bowel sounds; chronic distention from large ovarian cyst  Musculoskeletal:  Unsteady, shuffling gait, festinating, uses rollator walker, kyphotic posture  Neurological: She is alert and oriented to person, place, and time.  Some mild short term memory loss  Skin: Skin is warm and dry. Capillary refill takes less than 2  seconds.  Psychiatric: She has a normal mood and affect.  A little bit anxious, pleasant and talkative    Labs reviewed:  Recent Labs  07/11/15 08/03/15 0524 01/24/16 1105  NA 145 137 137  K 3.9 4.6 3.5  CL  --   --  100*  CO2  --   --  31  GLUCOSE  --   --  89  BUN 24* 13 22*  CREATININE 0.9 0.9 0.89  CALCIUM  --   --  9.2    Recent Labs  07/11/15 01/24/16 1105  AST 23 23  ALT 17 5*  ALKPHOS 31 32*  BILITOT  --  1.5*  PROT  --  6.3*  ALBUMIN  --  3.7    Recent Labs  07/11/15 08/03/15 0524 01/24/16 1105  WBC 4.5 6.8 4.4  NEUTROABS  --   --  2.8  HGB 14.2 12.4 14.3  HCT 42 37 42.9  MCV  --   --  97.5  PLT 162 238 130*   Lab Results  Component Value Date   TSH 2.47 12/22/2015   No results found for: HGBA1C Lab Results  Component Value Date   CHOL 199 08/12/2014   HDL 87 08/12/2014   LDLCALC 104 (H) 08/12/2014   LDLDIRECT 105.9 06/15/2013   TRIG 38 08/12/2014   CHOLHDL 2.3 08/12/2014   Assessment/Plan 1. Diarrhea, unspecified type -suspect she is simply getting too many medications for her bowels at this time -would be odd for a virus to last for 4 days (especially when no other residents or staff have had it) -also does not seem like c diff when it does not have the odor and Natalie Lam not among the highest risk group (not treated frequently for infection, is of course in the age group at higher risk) -for now, hold ALL constipation meds -will r/o c diff and infectious cause if loose stool recurs (c diff PCR and stool culture ordered just as Natalie Lam's son in law wrote and hat placed it Natalie Lam's toilet to collect sample) -Natalie Lam favors using the miralax -I suggested we use the regimen Dr. Carles Collet outlined that is specific for PD constipation when her loose stools resolve -order was written to notify me when Natalie Lam no longer having loose stools and new regimen is needed  2. Constipation, unspecified constipation type -avoid amitiza--I suspect it as  the culprit b/c it does tend to  cause uncontrollable loose bms per other patients' experience, as well plus correlation with onset of some of her symptoms is with [place] -consider increased fiber, hydration and miralax when this diarrhea resolves  3. Parkinson's disease ([place]) -cont increased dose of sinemet as per Dr. Doristine Devoid recommendations  4. Left ovarian cyst -noted on Ct abdomen and [place] confirmed cystic appearance--1 yr f/u recommended -Natalie Lam notes she recalls having something on her ovary when she was a young lady that was left alone when she had her hysterectomy  Family/ staff Communication: discussed with AL nursing, my office staff  Labs/tests ordered:  c diff toxin pcr, stool culture, hold constipation meds   Maggi Hershkowitz L. Jaquesha Boroff, D.O. [facility] [address], [place] [address] Cell Phone (Mon-Fri 8am-5pm):  [phone] On Call:  [phone] & follow prompts after 5pm & weekends Office Phone:  [phone] Office Fax:  [phone]

## 2016-02-27 NOTE — Progress Notes (Signed)
Apparently.  Her daughter requested a transfer to a different PCP (not sure but got the idea that they wanted someone out of the [facility] system).  She didn't give me any details and just asked for name of others that I recommend.  Hope that was okay that I gave them Tabor[place]'s information.

## 2016-03-01 ENCOUNTER — Telehealth: Payer: Self-pay | Admitting: Neurology

## 2016-03-01 ENCOUNTER — Telehealth: Payer: Self-pay | Admitting: *Deleted

## 2016-03-01 NOTE — Telephone Encounter (Signed)
Dr. Rubye Oaks (patient's daughter husband) called and left message and stated that patient has bowel issues and they are concerned about the Care patient is receiving and wants a 2nd opinion. Wants to see. Dr. Nyoka Cowden.  I called back and spoke with daughter and told her that Dr. Nyoka Cowden is not contracted with [facility] to come out and given 2nd opinion. Daughter stated that was fine because she just got an appointment with [facility] for her mother to be seen there.

## 2016-03-01 NOTE — Telephone Encounter (Signed)
Spoke with patient's daughter. They have appt with Dr. Nani Ravens next week. They will call if needed.

## 2016-03-01 NOTE — Telephone Encounter (Signed)
Natalie Lam  09/29/1931. Her # [id number].  Her daughter was calling for her mother. Doctor Tat had mentioned her trying to find another PCP. She tried calling the Doctor that Dr. Carles Collet had recommended but it  Is a 6 month wait. She was told by Dr. Carles Collet to call back if that was the case. Thank you

## 2016-03-01 NOTE — Telephone Encounter (Signed)
That is fine.  I was trying to fix the diarrhea.  All of her medications for her constipation were on hold and I ordered all of the samples (stool culture and c diff study) requested by Dr. Rubye Oaks b/c I agreed with ruling out those problems.  I saw the patient who was completely stable without abdominal pain.  When I was at [facility] yesterday for clinic, I was told that Mrs. Ellestad had not had anymore loose stool to collect the requested samples.  A hat was in the toilet to collect them if they occurred.  I've heard nothing more to be able to do anything differently so this is quite frustrating.  Did Dr. Rubye Oaks speak with the nursing staff at [facility] at all to communicate if they wanted something different done?  That would probably be helpful b/c they are working directly with his mother-in-law.

## 2016-03-02 DIAGNOSIS — R197 Diarrhea, unspecified: Secondary | ICD-10-CM | POA: Diagnosis not present

## 2016-03-02 DIAGNOSIS — G20 Parkinson's disease: Secondary | ICD-10-CM | POA: Diagnosis not present

## 2016-03-04 ENCOUNTER — Encounter: Payer: Self-pay | Admitting: Internal Medicine

## 2016-03-07 ENCOUNTER — Telehealth: Payer: Self-pay | Admitting: Family Medicine

## 2016-03-07 ENCOUNTER — Ambulatory Visit (INDEPENDENT_AMBULATORY_CARE_PROVIDER_SITE_OTHER): Payer: Medicare Other | Admitting: Family Medicine

## 2016-03-07 ENCOUNTER — Encounter: Payer: Self-pay | Admitting: Family Medicine

## 2016-03-07 VITALS — BP 122/84 | HR 66 | Temp 98.4°F | Ht 60.0 in | Wt 132.2 lb

## 2016-03-07 DIAGNOSIS — R140 Abdominal distension (gaseous): Secondary | ICD-10-CM | POA: Diagnosis not present

## 2016-03-07 DIAGNOSIS — E039 Hypothyroidism, unspecified: Secondary | ICD-10-CM | POA: Diagnosis not present

## 2016-03-07 DIAGNOSIS — M7989 Other specified soft tissue disorders: Secondary | ICD-10-CM

## 2016-03-07 LAB — COMPREHENSIVE METABOLIC PANEL
ALBUMIN: 3.7 g/dL (ref 3.5–5.2)
ALK PHOS: 35 U/L — AB (ref 39–117)
ALT: 4 U/L (ref 0–35)
AST: 19 U/L (ref 0–37)
BILIRUBIN TOTAL: 0.7 mg/dL (ref 0.2–1.2)
BUN: 19 mg/dL (ref 6–23)
CO2: 28 mEq/L (ref 19–32)
Calcium: 9.2 mg/dL (ref 8.4–10.5)
Chloride: 109 mEq/L (ref 96–112)
Creatinine, Ser: 0.8 mg/dL (ref 0.40–1.20)
GFR: 72.95 mL/min (ref >60.00)
Glucose, Bld: 108 mg/dL — ABNORMAL HIGH (ref 70–99)
POTASSIUM: 3.9 meq/L (ref 3.5–5.1)
Sodium: 142 mEq/L (ref 135–145)
TOTAL PROTEIN: 6.2 g/dL (ref 6.0–8.3)

## 2016-03-07 LAB — T4, FREE: Free T4: 0.76 ng/dL (ref 0.60–1.60)

## 2016-03-07 LAB — TSH: TSH: 1.49 u[IU]/mL (ref 0.35–4.50)

## 2016-03-07 NOTE — Progress Notes (Signed)
Chief Complaint  Patient presents with  . Establish Care    Pt reports history of HPN and experienced a syncope episode where health has decreased since/ History of Parkinson's disease with newly increased symptoms/ Pt is currently in [facility] at [facility]/Pt granddaughter states she has noticed abdominal swelling and edema in both legs LT>RT       New Patient Visit SUBJECTIVE: HPI: Natalie Lam is an 82 y.o.female who is being seen for establishing care.  The patient was previously seen at [facility] with Dr. Linna Darner, who has since retired.  Swelling Le edema that worsened around 1 year ago. Having abdominal distension over the past 3 mo as well. CT abd/pelvis unremarkable. Has issues with constipation, currently on a good regimen. She has a hx of neuropathy, and was started on Neurontin a short period of time before the swelling started. She does elevate her legs and uses medical compression stockings routinely.   Allergies  Allergen Reactions  . Sulfonamide Derivatives     REACTION:generalized  swelling  . Sulfa Antibiotics Swelling    Past Medical History:  Diagnosis Date  . Aortic stenosis, mild   . Gilbert syndrome   . HTN (hypertension)   . Hyperlipidemia   . Hypothyroidism   . Melanoma ([place])   . Neuropathy ([place])   . Osteoporosis   . Parkinson's disease ([place])   . Peripheral neuropathy ([facility])    Bilateral  . Rheumatic fever    does not take Flu shot    Past Surgical History:  Procedure Laterality Date  . ABDOMINAL HYSTERECTOMY  1963   no BSO; no abnormal PAPs  . COLONOSCOPY  2000   negative; [place]  . [place]  . MELANOMA EXCISION  08/2014   Social History   Social History  . Marital status: Widowed   Occupational History  . retired own [employer] Topics  . Smoking status: Never Smoker  . Smokeless tobacco: Never Used  . Alcohol use No  . Drug use: No   Social History Narrative   Lives at  [facility]/2015   Widowed   Never smoked   Exercise walking   Alcohol none   POA/Living Will         Family History  Problem Relation Age of Onset  . Dementia Mother     Alzheimers  . Stroke Mother     ? 63  . Cancer Son     multiple myeloma  . Pancreatic cancer Son   . Heart disease Brother   . Lung cancer Maternal Uncle     smoker  . Heart attack Neg Hx   . Diabetes Neg Hx   . Hearing loss Neg Hx      Current Outpatient Prescriptions:  .  Calcium Carbonate-Vitamin D (CALCIUM 600/VITAMIN D PO), Take 1 tablet by mouth daily. , Disp: , Rfl:  .  carbidopa-levodopa (SINEMET IR) 25-100 MG tablet, Take 1 tablet by mouth 3 (three) times daily. , Disp: , Rfl: 0 .  Cholecalciferol (VITAMIN D) 2000 UNITS CAPS, Take 1 capsule (2,000 Units total) by mouth daily., Disp: 30 capsule, Rfl: 3 .  levothyroxine (SYNTHROID, LEVOTHROID) 50 MCG tablet, Take one on Sunday, Monday, Wednesday, and Friday, Disp: , Rfl:  .  levothyroxine (SYNTHROID, LEVOTHROID) 75 MCG tablet, Take one on Tuesday, Thursday, and Saturday., Disp: , Rfl: 0 .  losartan (COZAAR) 50 MG tablet, Take 50 mg by mouth 2 (two)  times daily., Disp: , Rfl:  .  Multiple Vitamin (MULTIVITAMIN) capsule, Take 1 capsule by mouth daily.  , Disp: , Rfl:  .  omeprazole (PRILOSEC) 20 MG capsule, Take 20 mg by mouth daily. , Disp: , Rfl: 1 .  polyethylene glycol powder (GLYCOLAX/MIRALAX) powder, , Disp: , Rfl: 0 .  Scar Treatment Products ([place] SPF 30 EX), Apply topically., Disp: , Rfl:   No LMP recorded. Patient has had a hysterectomy.  ROS Resp: Denies cough or SOB  GI: +abdominal distension   OBJECTIVE: BP 122/84 (BP Location: Left Arm, Patient Position: Sitting, Cuff Size: Small)   Pulse 66   Temp 98.4 F (36.9 C) (Oral)   Ht 5' (1.524 m)   Wt 132 lb 3.2 oz (60 kg)   SpO2 98%   BMI 25.82 kg/m   Constitutional: -  VS reviewed -  Well developed, well nourished, appears stated age -  No apparent distress   Psychiatric: -  Oriented to person, place, and time -  Memory intact -  Affect and mood normal -  Fluent conversation, good eye contact -  Judgment and insight age appropriate  Eye: -  Conjunctivae clear, no discharge -  Pupils symmetric, round, reactive to light  ENMT: -  Oral mucosa without lesions, tongue and uvula midline    Tonsils not enlarged, no erythema, no exudate, trachea midline    Pharynx moist, no lesions, no erythema  Neck: -  No gross swelling, no palpable masses -  Thyroid midline, not enlarged, mobile, no palpable masses  Cardiovascular: -  RRR, 3/6 SEM heard loudest at aortic listening post, +radiation to carotids -  2+ pitting edema in LE b/l up to prox 1/3 of tibia  Respiratory: -  Normal respiratory effort, no accessory muscle use, no retraction -  Breath sounds equal, no wheezes, no ronchi, no crackles  Gastrointestinal: -  Bowel sounds normal -  No tenderness, moderate distention, no guarding, no masses -  I do not appreciate a fluid wave  Neurological: -  CN II - XII grossly intact -  Sensation grossly intact to light touch, equal bilaterally  Musculoskeletal: -  No clubbing, no cyanosis -  Gait shuffling and requiring a walker -  Increased kyphotic curve, concave leftward spinal curvature  Skin: -  No significant lesion on inspection -  Warm and dry to palpation   ASSESSMENT/PLAN: Swelling of lower extremity - Plan: Comprehensive metabolic panel  Abdominal distension - Plan: Comprehensive metabolic panel  Hypothyroidism, unspecified type - Plan: TSH, T4, free   Orders as above. Stop Pravastatin, Amitiza (side effects), and Neurontin (a side effect is LE edema). Patient should return in 1 mo to recheck her neuropathy. The patient and her daughter voiced understanding and agreement to the plan.  [place], DO 03/07/16  2:20 PM

## 2016-03-07 NOTE — Patient Instructions (Addendum)
Stop taking Pravastatin, Neurontin (gabapentin) and Amitiza please.

## 2016-03-07 NOTE — Telephone Encounter (Signed)
Caller name:Coye,Tonya Relation to pt: daughter  Call back number: [phone]    Reason for call:  Daughter would like it noted that patient can not live independently and will remain in Assistive living at [facility]. Daughter states insurance always gives her a hard time. Please advise

## 2016-03-07 NOTE — Progress Notes (Signed)
Pre visit review using our clinic review tool, if applicable. No additional management support is needed unless otherwise documented below in the visit note. 

## 2016-03-08 NOTE — Telephone Encounter (Signed)
Please advise 

## 2016-03-11 ENCOUNTER — Telehealth: Payer: Self-pay | Admitting: Family Medicine

## 2016-03-11 NOTE — Telephone Encounter (Signed)
Please advise 

## 2016-03-11 NOTE — Telephone Encounter (Signed)
For documentation purposes, has home nursing been tried? Thanks.

## 2016-03-11 NOTE — Telephone Encounter (Signed)
Form filled out to fax. Ty.

## 2016-03-11 NOTE — Telephone Encounter (Signed)
Caller name:  TRENELL RAYL [facility]) Call back number: [phone]   Reason for call:  Requesting orders for pressure stockings please fax to [facility] fax #  [phone]

## 2016-03-12 NOTE — Telephone Encounter (Signed)
Order has been faxed

## 2016-03-14 NOTE — Telephone Encounter (Signed)
There was no form to fill out, was there?

## 2016-03-14 NOTE — Telephone Encounter (Signed)
Natalie Lam states that patient did not have issues until she fell and then Dr. Huey Bienenstock suggested patient to go into Assistive Living because of Peripheral Neuropathy and that she would be unsafe to live alone and perform ADL's. Pt has been in [place] [facility] since August 2016.

## 2016-03-14 NOTE — Telephone Encounter (Signed)
No she just would like it to be documented in her chart for insurance purposes.

## 2016-04-01 ENCOUNTER — Emergency Department (HOSPITAL_BASED_OUTPATIENT_CLINIC_OR_DEPARTMENT_OTHER): Payer: Medicare Other

## 2016-04-01 ENCOUNTER — Encounter (HOSPITAL_BASED_OUTPATIENT_CLINIC_OR_DEPARTMENT_OTHER): Payer: Self-pay | Admitting: *Deleted

## 2016-04-01 ENCOUNTER — Emergency Department (HOSPITAL_BASED_OUTPATIENT_CLINIC_OR_DEPARTMENT_OTHER)
Admission: EM | Admit: 2016-04-01 | Discharge: 2016-04-01 | Disposition: A | Discharge status: Home / Self Care | Payer: Medicare Other | Attending: Emergency Medicine | Admitting: Emergency Medicine

## 2016-04-01 ENCOUNTER — Telehealth: Payer: Self-pay | Admitting: *Deleted

## 2016-04-01 DIAGNOSIS — K5900 Constipation, unspecified: Secondary | ICD-10-CM | POA: Diagnosis not present

## 2016-04-01 DIAGNOSIS — E039 Hypothyroidism, unspecified: Secondary | ICD-10-CM | POA: Insufficient documentation

## 2016-04-01 DIAGNOSIS — Z79899 Other long term (current) drug therapy: Secondary | ICD-10-CM | POA: Insufficient documentation

## 2016-04-01 DIAGNOSIS — G20 Parkinson's disease: Secondary | ICD-10-CM | POA: Insufficient documentation

## 2016-04-01 DIAGNOSIS — I10 Essential (primary) hypertension: Secondary | ICD-10-CM | POA: Insufficient documentation

## 2016-04-01 LAB — URINALYSIS, ROUTINE W REFLEX MICROSCOPIC
BILIRUBIN URINE: NEGATIVE
GLUCOSE, UA: NEGATIVE mg/dL
HGB URINE DIPSTICK: NEGATIVE
KETONES UR: NEGATIVE mg/dL
Leukocytes, UA: NEGATIVE
NITRITE: NEGATIVE
PH: 7.5 (ref 5.0–8.0)
Protein, ur: NEGATIVE mg/dL
SPECIFIC GRAVITY, URINE: 1.016 (ref 1.005–1.030)

## 2016-04-01 LAB — URINE MICROSCOPIC-ADD ON: Bacteria, UA: NONE SEEN

## 2016-04-01 MED ORDER — DOCUSATE SODIUM 100 MG PO CAPS: 100.0000 mg | ORAL_CAPSULE | Freq: Two times a day (BID) | ORAL | 0 refills | Status: DC

## 2016-04-01 MED ORDER — MAGNESIUM CITRATE PO SOLN: 1.0000 | Freq: Every day | ORAL | 0 refills | Status: AC

## 2016-04-01 MED ORDER — DOCUSATE SODIUM 100 MG PO CAPS
100.0000 mg | ORAL_CAPSULE | Freq: Once | ORAL | Status: AC
  Administered 2016-04-01: 100 mg via ORAL
  Filled 2016-04-01: qty 1

## 2016-04-01 MED ORDER — POLYETHYLENE GLYCOL 3350 17 G PO PACK
17.0000 g | PACK | Freq: Every day | ORAL | Status: DC
  Filled 2016-04-01: qty 1

## 2016-04-01 MED FILL — DOK 100 MG SOFTGEL: 100 | 50 days supply | Qty: 100 | Fill #0

## 2016-04-01 MED FILL — MAGNESIUM CITRATE SOLUTION: 1.745 | 2 days supply | Qty: 592 | Fill #0

## 2016-04-01 NOTE — ED Provider Notes (Signed)
[facility] DEPT [facility] Provider Note   CSN: [id number] Arrival date & time: 04/01/16  1016     History   Chief Complaint Chief Complaint  Patient presents with  . Constipation    HPI Natalie Lam is a 82 y.o. female.  HPI Patient complains of constipation the past 7 days.  Feels slightly distended.  No history of small bowel obstruction.  Reports 2 enemas without much relief.  Patient also currently on half dose of MiraLAX daily without improvement.  Brought to the ER for further evaluation.  Denies nausea vomiting.  His been eating and drinking normally.  Lives at [facility] in the assisted living component.  Patient with a history of Parkinson's disease yet she states that she continues to be ambulatory with her walker.    Past Medical History:  Diagnosis Date  . Aortic stenosis, mild   . Gilbert syndrome   . HTN (hypertension)   . Hyperlipidemia   . Hypothyroidism   . Melanoma ([place])   . Neuropathy ([place])   . Osteoporosis   . Parkinson's disease ([place])   . Peripheral neuropathy ([facility])    Bilateral  . Rheumatic fever    does not take Flu shot     Patient Active Problem List   Diagnosis Date Noted  . Parkinson's disease ([place]) 09/13/2015  . Mild cognitive impairment with memory loss 04/22/2015  . Other fatigue 04/22/2015  . Urge urinary incontinence 04/22/2015  . Unsteady gait 04/22/2015  . Chronic venous insufficiency 04/22/2015  . Hereditary and idiopathic peripheral neuropathy 04/22/2015  . Thoracic compression fracture ([place]) 01/11/2015  . History of melanoma 12/09/2014  . Torticollis 12/09/2014  . Abnormality of gait 12/09/2014  . Bilateral carotid bruits 08/18/2014  . Aortic valve stenosis 08/18/2014  . Syncope 08/11/2014  . Gilbert's syndrome 08/11/2014  . Heart murmur, systolic 00/76/2263  . HTN (hypertension) 06/25/2013  . Skin cancer 07/14/2012  . Hypopotassemia 04/16/2012  . Abnormal serum level of alkaline phosphatase 04/16/2012  . Esophageal  reflux 06/27/2010  . THYROID NODULE, LEFT 04/18/2009  . Vitamin D deficiency 10/01/2007  . Hypothyroidism 08/21/2007  . HLD (hyperlipidemia) 08/21/2007  . Varicose veins of both lower extremities 08/21/2007  . Senile osteoporosis 08/21/2007    Past Surgical History:  Procedure Laterality Date  . ABDOMINAL HYSTERECTOMY  1963   no BSO; no abnormal PAPs  . COLONOSCOPY  2000   negative; [place]  . [place]  . MELANOMA EXCISION  08/2014    OB History    No data available       Home Medications    Prior to Admission medications   Medication Sig Start Date End Date Taking? Authorizing Provider  Calcium Carbonate-Vitamin D (CALCIUM 600/VITAMIN D PO) Take 1 tablet by mouth daily.    Yes Historical Provider, MD  carbidopa-levodopa (SINEMET IR) 25-100 MG tablet Take 1 tablet by mouth 3 (three) times daily.  09/28/15  Yes Historical Provider, MD  Cholecalciferol (VITAMIN D) 2000 UNITS CAPS Take 1 capsule (2,000 Units total) by mouth daily. 01/11/15  Yes Tiffany Lynelle Doctor, DO  levothyroxine (SYNTHROID, LEVOTHROID) 50 MCG tablet Take one on Sunday, Monday, Wednesday, and Friday   Yes Historical Provider, MD  levothyroxine (SYNTHROID, LEVOTHROID) 75 MCG tablet Take one on Tuesday, Thursday, and Saturday. 08/22/15  Yes Historical Provider, MD  Multiple Vitamin (MULTIVITAMIN) capsule Take 1 capsule by mouth daily.     Yes Historical Provider, MD  omeprazole (PRILOSEC) 20 MG capsule Take 20 mg by mouth daily.  10/04/15  Yes Historical Provider, MD  polyethylene glycol powder (GLYCOLAX/MIRALAX) powder  02/14/16  Yes Historical Provider, MD  Scar Treatment Products ([place] SPF 109 EX) Apply topically.   Yes Historical Provider, MD  docusate sodium (COLACE) 100 MG capsule Take 1 capsule (100 mg total) by mouth every 12 (twelve) hours. 04/01/16   Jola Schmidt, MD  magnesium citrate SOLN Take 296 mLs (1 Bottle total) by mouth daily. 04/01/16 04/03/16  Jola Schmidt, MD    Family History Family  History  Problem Relation Age of Onset  . Dementia Mother     Alzheimers  . Stroke Mother     ? 79  . Cancer Son     multiple myeloma  . Pancreatic cancer Son   . Heart disease Brother   . Lung cancer Maternal Uncle     smoker  . Heart attack Neg Hx   . Diabetes Neg Hx   . Hearing loss Neg Hx     Social History Social History  Substance Use Topics  . Smoking status: Never Smoker  . Smokeless tobacco: Never Used  . Alcohol use No     Allergies   Sulfonamide derivatives and Sulfa antibiotics   Review of Systems Review of Systems  All other systems reviewed and are negative.    Physical Exam Updated Vital Signs BP 188/90 (BP Location: Right Arm)   Pulse 72   Temp 97.8 F (36.6 C) (Oral)   Resp 18   Ht 5' (1.524 m)   Wt 126 lb (57.2 kg)   SpO2 97%   BMI 24.61 kg/m   Physical Exam  Constitutional: She is oriented to person, place, and time. She appears well-developed and well-nourished.  HENT:  Head: Normocephalic.  Eyes: EOM are normal.  Neck: Normal range of motion.  Cardiovascular: Normal rate.   Pulmonary/Chest: Effort normal.  Abdominal: Soft. She exhibits distension. There is no tenderness.  Genitourinary:  Genitourinary Comments: Rectal examination demonstrates no significant stool in the rectal vault.  Brown stool.  No gross blood.  Musculoskeletal: Normal range of motion.  Neurological: She is alert and oriented to person, place, and time.  Psychiatric: She has a normal mood and affect.  Nursing note and vitals reviewed.    ED Treatments / Results  Labs (all labs ordered are listed, but only abnormal results are displayed) Labs Reviewed  URINALYSIS, ROUTINE W REFLEX MICROSCOPIC (NOT AT [facility]) - Abnormal; Notable for the following:       Result Value   APPearance TURBID (*)    All other components within normal limits  URINE MICROSCOPIC-ADD ON - Abnormal; Notable for the following:    Squamous Epithelial / LPF 0-5 (*)    All other  components within normal limits  URINE CULTURE    EKG  EKG Interpretation None       Radiology Dg Abd 2 Views  Result Date: 04/01/2016 CLINICAL DATA:  82 year old female with a history of constipation EXAM: ABDOMEN - 2 VIEW COMPARISON:  None. FINDINGS: Moderate stool burden within the colon. No abnormally distended small bowel or colon. Gas extends to the rectum. Scoliotic curvature of the thoracolumbar spine with associated degenerative changes. No displaced fracture. Vascular calcifications. IMPRESSION: Nonobstructive bowel gas pattern with moderate stool burden. Signed, Dulcy Fanny. Earleen Newport, DO [facility] Radiology Electronically Signed   By: Corrie Mckusick D.O.   On: 04/01/2016 11:23    Procedures Procedures (including critical care time)  Medications Ordered in ED Medications  polyethylene  glycol (MIRALAX / GLYCOLAX) packet 17 g (not administered)  docusate sodium (COLACE) capsule 100 mg (not administered)     Initial Impression / Assessment and Plan / ED Course  I have reviewed the triage vital signs and the nursing notes.  Pertinent labs & imaging results that were available during my care of the patient were reviewed by me and considered in my medical decision making (see chart for details).  Clinical Course     Clinical constipation.  X-ray confirms this.  No evidence of small bowel obstruction.  Tolerating fluids.  I will add mag citrate 2 doses.  I will change her half dose of MiraLAX to daily MiraLAX.  I will I also add docusate twice a day.  Primary care follow-up.  The patient understands return to the ER for new or worsening symptoms  Final Clinical Impressions(s) / ED Diagnoses   Final diagnoses:  Constipation  Constipation, unspecified constipation type    New Prescriptions New Prescriptions   DOCUSATE SODIUM (COLACE) 100 MG CAPSULE    Take 1 capsule (100 mg total) by mouth every 12 (twelve) hours.    MAGNESIUM CITRATE SOLN    Take 296 mLs (1 Bottle total) by mouth daily.     Jola Schmidt, MD 04/01/16 [phone]

## 2016-04-01 NOTE — ED Triage Notes (Signed)
C/o constipation that started 7 days ago. Has taken miralax and it stopped working.  Has had 2 enemas without much relief.

## 2016-04-01 NOTE — Telephone Encounter (Signed)
[facility] note received via fax  Call:   Date: 03/30/16 Time: 1927   Caller: [name], daughter Return number: [phone]  Nurse: Ocie Cornfield, RN  Chief Complaint: Constipation  Reason for call: [place] is constipated, no movement in 6 days, she is in a facility, Dr. Nani Ravens  Related visit to physician within the last 2 weeks: Yes  Guideline: Constipation-Abdomen is more swollen than usual  Disposition: See Physician within 24 Hours  **Pt seen at [facility] ED 04/01/16**

## 2016-04-01 NOTE — Discharge Instructions (Signed)
Please take dose of mag citrate TODAY (04/01/2016)  Full dose of Miralax tomorrow (04/02/2016)  Second dose of Mag Citrate tomorrow (04/02/2016) @ 2pm if no bowel movement  Take docusate twice daily x 1 month

## 2016-04-02 LAB — URINE CULTURE: Culture: NO GROWTH

## 2016-04-06 ENCOUNTER — Telehealth: Payer: Self-pay | Admitting: General Practice

## 2016-04-06 NOTE — Telephone Encounter (Signed)
May use Magnesium citrate prn constipation but would be time- limit of no more than one week of use.

## 2016-04-06 NOTE — Telephone Encounter (Signed)
Verbal ok given and it will be dispensed over 3 days.

## 2016-04-06 NOTE — Telephone Encounter (Signed)
Judeen Hammans is calling in on behalf of patients family. Patient was seen in the ED on 11/6, on discharge she was placed on a BM program which includes 1/2 dose of miralax every morning.   Pt family states that they cannot take the word of patient that she had her last BM on 11/9 and are requesting orders for the PRN magnesium Citrate (location has on hand) to be administered. Pt son-in-law is a provider and has orders that he brought in from a colleague. Judeen Hammans advised him that she could not take these orders she needed to be given the ok from a provider within [place].   Judeen Hammans states that pt has a history with parkinson's and she has positive bowel sounds.    Please advise

## 2016-04-08 ENCOUNTER — Ambulatory Visit (INDEPENDENT_AMBULATORY_CARE_PROVIDER_SITE_OTHER): Payer: Medicare Other | Admitting: Family Medicine

## 2016-04-08 ENCOUNTER — Encounter: Payer: Self-pay | Admitting: Family Medicine

## 2016-04-08 VITALS — BP 138/90 | HR 73 | Temp 98.3°F | Ht 60.0 in | Wt 124.2 lb

## 2016-04-08 DIAGNOSIS — Z593 Problems related to living in residential institution: Secondary | ICD-10-CM

## 2016-04-08 DIAGNOSIS — K5901 Slow transit constipation: Secondary | ICD-10-CM

## 2016-04-08 NOTE — Patient Instructions (Signed)
Stop taking Colace.

## 2016-04-08 NOTE — Progress Notes (Signed)
Pre visit review using our clinic review tool, if applicable. No additional management support is needed unless otherwise documented below in the visit note. 

## 2016-04-08 NOTE — Progress Notes (Signed)
Chief Complaint  Patient presents with  . Follow-up    Pt daughter states pt has had increase symptoms x3 weeks/Pt has had constipation and went to ED was given magcitrate with relief     Subjective: Patient is a 82 y.o. female here for medication f/u.  Recently established with me around 1 month ago. She had terrible swelling in her lower extremities. She was on gabapentin and due to the side effect of lower extreme swelling, she was taken off this. Her swelling is much improved since then.  She go to the ED for constipation. She is given neck citrate that did help. She is on Colace and MiraLAX normally. [facility], her assisted living home, needs direct orders from our office. She denies any abdominal pain, nausea, or vomiting.  ROS: GI- no N/V/D/C or pain  Family History  Problem Relation Age of Onset  . Dementia Mother     Alzheimers  . Stroke Mother     ? 81  . Cancer Son     multiple myeloma  . Pancreatic cancer Son   . Heart disease Brother   . Lung cancer Maternal Uncle     smoker  . Heart attack Neg Hx   . Diabetes Neg Hx   . Hearing loss Neg Hx    Past Medical History:  Diagnosis Date  . Aortic stenosis, mild   . Gilbert syndrome   . HTN (hypertension)   . Hyperlipidemia   . Hypothyroidism   . Melanoma ([place])   . Neuropathy ([place])   . Osteoporosis   . Parkinson's disease ([place])   . Peripheral neuropathy (HCC)    Bilateral  . Rheumatic fever    does not take Flu shot    Allergies  Allergen Reactions  . Sulfonamide Derivatives     REACTION:generalized  swelling  . Sulfa Antibiotics Swelling    Current Outpatient Prescriptions:  .  Calcium Carbonate-Vitamin D (CALCIUM 600/VITAMIN D PO), Take 1 tablet by mouth daily. , Disp: , Rfl:  .  carbidopa-levodopa (SINEMET IR) 25-100 MG tablet, Take 1 tablet by mouth 3 (three) times daily. , Disp: , Rfl: 0 .  Cholecalciferol (VITAMIN D) 2000 UNITS CAPS, Take 1 capsule (2,000 Units total) by mouth daily., Disp: 30  capsule, Rfl: 3 .  levothyroxine (SYNTHROID, LEVOTHROID) 50 MCG tablet, Take one on Sunday, Monday, Wednesday, and Friday, Disp: , Rfl:  .  levothyroxine (SYNTHROID, LEVOTHROID) 75 MCG tablet, Take one on Tuesday, Thursday, and Saturday., Disp: , Rfl: 0 .  Multiple Vitamin (MULTIVITAMIN) capsule, Take 1 capsule by mouth daily.  , Disp: , Rfl:  .  omeprazole (PRILOSEC) 20 MG capsule, Take 20 mg by mouth daily. , Disp: , Rfl: 1 .  Scar Treatment Products ([place] SPF 30 EX), Apply topically., Disp: , Rfl:  .  magnesium citrate SOLN, 1 bottle if no bowel movement in 3 days., Disp: 195 mL, Rfl:  .  polyethylene glycol powder (GLYCOLAX/MIRALAX) powder, 17 g (1 dose) daily, may add 2nd dose as needed., Disp: 3350 g, Rfl: 1  Objective: BP 138/90 (BP Location: Right Arm, Patient Position: Sitting, Cuff Size: Small)   Pulse 73   Temp 98.3 F (36.8 C) (Oral)   Ht 5' (1.524 m)   Wt 124 lb 3.2 oz (56.3 kg)   SpO2 97%   BMI 24.26 kg/m  General: Awake, appears stated age HEENT: MMM, EOMi Heart: RRR, 2/6 SEM heard loudest at the aortic listening post, no lower extremity edema noted today Lungs:  CTAB, no rales, wheezes or rhonchi. No accessory muscle use Abd: BS+, soft, NT, moderate distention, no masses or organomegaly Psych: Appears to have appropriate judgment and insight, normal affect and mood  Assessment and Plan: Slow transit constipation - Plan: magnesium citrate SOLN, polyethylene glycol powder (GLYCOLAX/MIRALAX) powder  Living in assisted living - Plan: Care order/instruction:  Orders as above. Stop Colace. Mag Citrate 1 bottle if no BM for 3 days, MiraLax 1 packet daily, may add second packet as needed, hold for diarrhea. Toenail care prn.  Updated DNR form with me as her PCP. F/u in 5 months or as needed. The patient and her daughter voiced understanding and agreement to the plan.  [place], DO 04/08/16  12:16 PM

## 2016-04-29 ENCOUNTER — Telehealth: Payer: Self-pay | Admitting: Family Medicine

## 2016-04-29 ENCOUNTER — Ambulatory Visit (INDEPENDENT_AMBULATORY_CARE_PROVIDER_SITE_OTHER): Payer: Medicare Other

## 2016-04-29 DIAGNOSIS — Z111 Encounter for screening for respiratory tuberculosis: Secondary | ICD-10-CM | POA: Diagnosis not present

## 2016-04-29 NOTE — Telephone Encounter (Signed)
Called [facility]. Advised in order for [place] to be a ble to complete her documentation on her form patient would need to come here on Wednesday to have PPD read. Otherwise we will not be able to close chart and complete form to send them the documentation they are requesting. Agreed to return on Wednesday to have PD read.

## 2016-04-29 NOTE — Telephone Encounter (Signed)
Please advise. TL/CMA 

## 2016-04-29 NOTE — Progress Notes (Signed)
Pre visit review using our clinic tool,if applicable. No additional management support is needed unless otherwise documented below in the visit note.   Patient in for TB screening. Advised to return for check on Wednesday no later than 10:30 am. Grandaughter agreed.

## 2016-04-29 NOTE — Telephone Encounter (Signed)
[facility] is calling because patient's family requested that patient's TB skin test be read at their facility. They would need the TB skin test form faxed over to them to do this. Please advise.   Phone: [phone] FAX: [phone]

## 2016-04-30 IMAGING — CR DG CHEST 2V
2 series · 2 of 2 positions shown · non-contrast
Comparison: Chest radiograph 08/11/2014

CLINICAL DATA: Patient with 3 weeks to shortness of breath.

EXAM:
CHEST  2 VIEW

[view not recorded (1 of 2)]
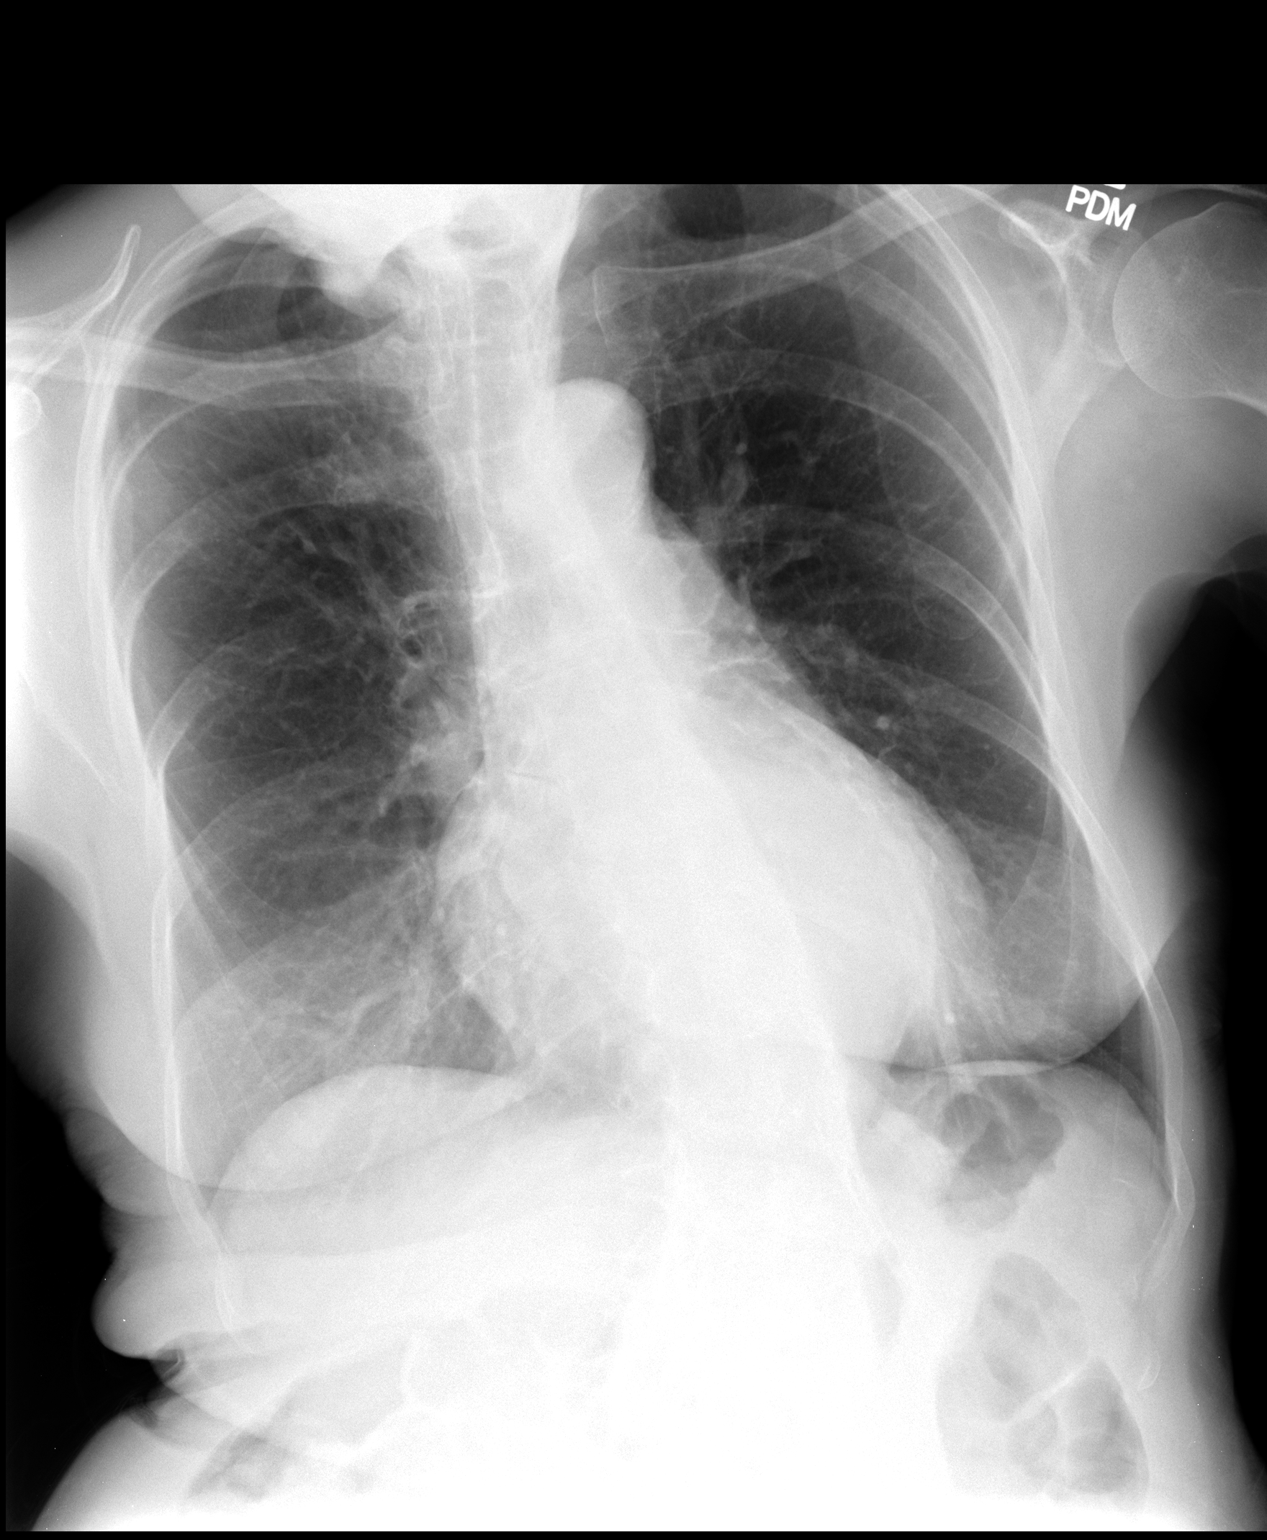

[view not recorded (2 of 2)]
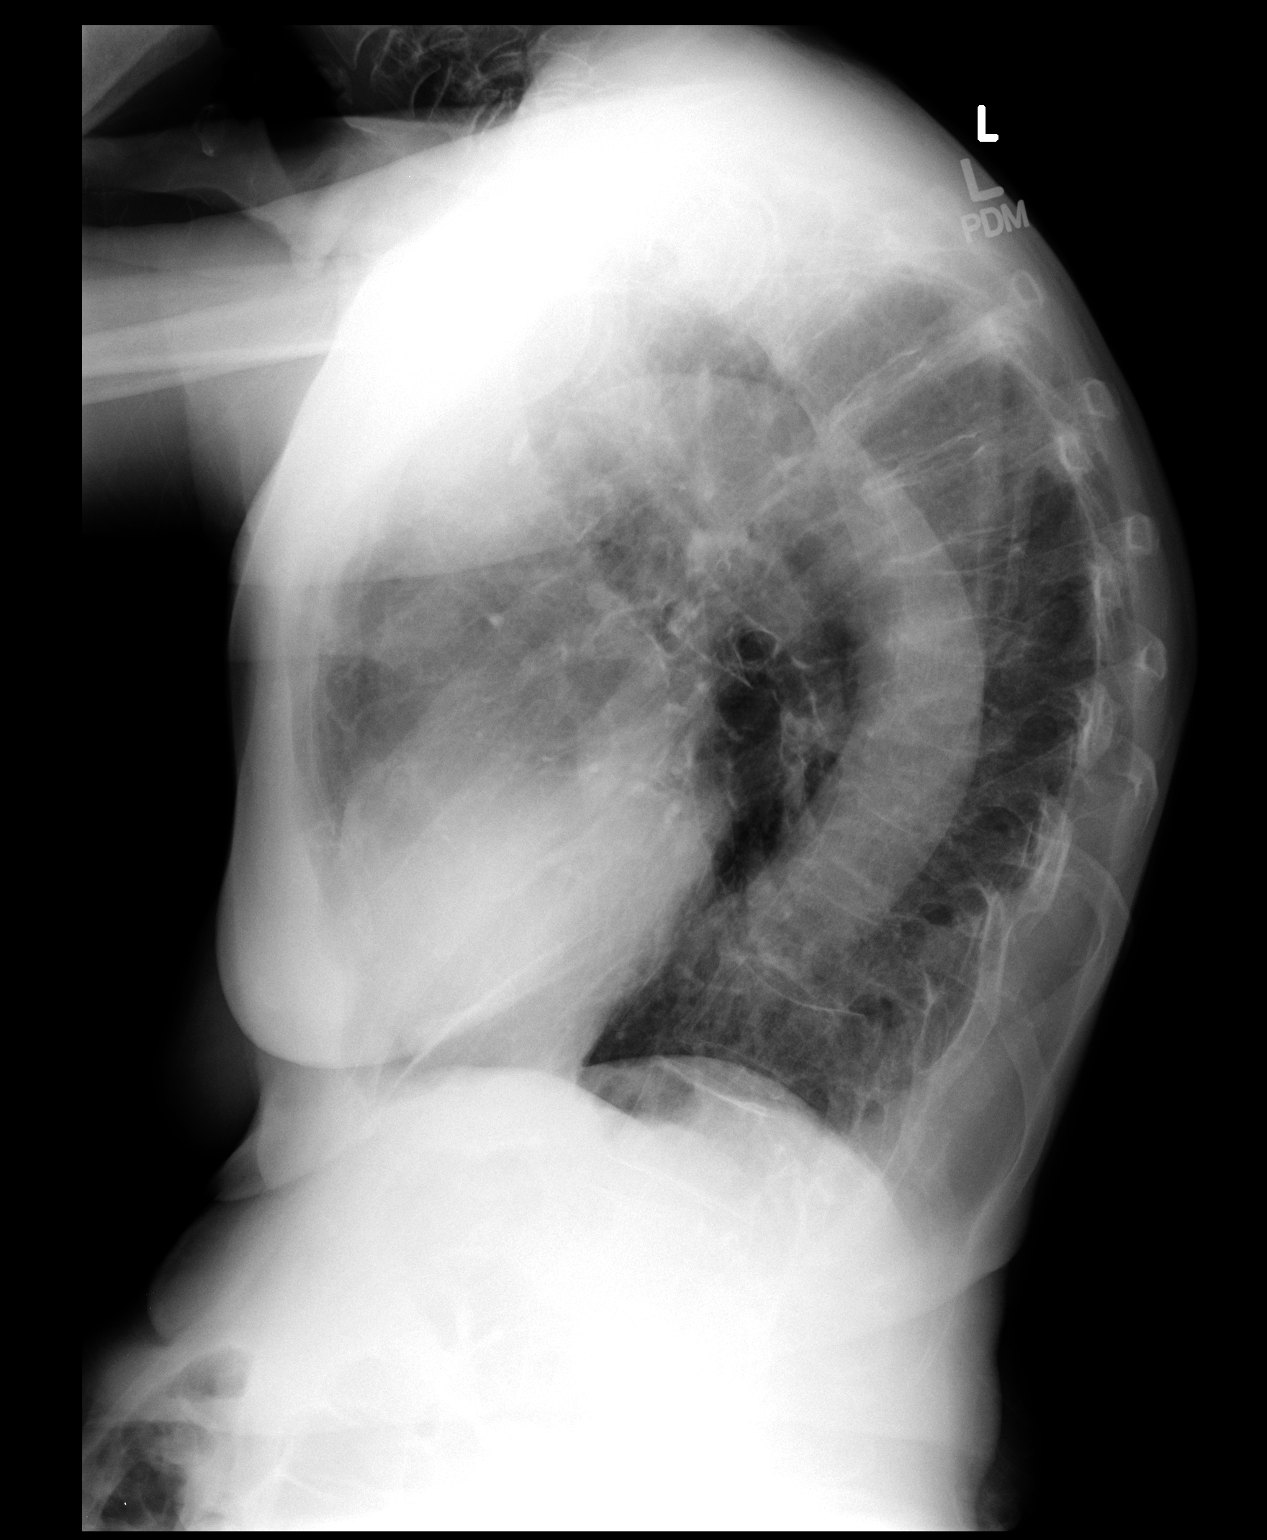

[2 of 2 positions shown; findings below may reference images not displayed]

FINDINGS: Patient is rotated limiting evaluation. Stable enlarged cardiac and
mediastinal contours with tortuosity of the thoracic aorta. Lungs
are hyperinflated. No pulmonary consolidation. Unchanged mid
thoracic spine vertebral compression deformities.
IMPRESSION: No acute cardiopulmonary process.

Cardiomegaly.

Pulmonary hyperinflation.

Unchanged mid thoracic spine compression deformities.

## 2016-05-01 LAB — TB SKIN TEST
INDURATION: 0 mm
TB SKIN TEST: NEGATIVE

## 2016-05-03 ENCOUNTER — Telehealth: Payer: Self-pay | Admitting: Family Medicine

## 2016-05-03 NOTE — Telephone Encounter (Signed)
I tried to fill it out based on that already and am unsure how to answer some of the questions. If it is just a few, will just call the daughter. There may be a fee for filling out the form.   TL- do we know where the forms are? TY.

## 2016-05-03 NOTE — Telephone Encounter (Signed)
Pt daughter called back and stated patient has been seen twice by Dr. Nani Ravens and wanted to know if he could fill out forms based on previous visits.  She says that it's very difficult to get patient in and if he could complete forms based on the information he already has, that would be greatly appreciated.  Please advise.

## 2016-05-03 NOTE — Telephone Encounter (Signed)
Caller name: Andree Elk  Relation to pt: Community Relation [facility]  Call back number: [phone] fax # [phone]    Reason for call:  Calling to verify if FL2 forms where received, will re fax to [phone] Attention Tiffany Long

## 2016-05-03 NOTE — Telephone Encounter (Signed)
FL2 forms received.  Per Dr. Nani Ravens, pt needs to be seen in order to complete forms.  Called Andree Elk and made her aware.  She stated that patient is scheduled to move in next Thursday, 05/09/16 and will need forms completed by Tuesday, 05/07/16.  Debbie gave pt's daughter's number for [place] to contact her and schedule an appt with patient.  Called Latish Umbaugh at [phone] and left a message for call back.

## 2016-05-06 NOTE — Telephone Encounter (Signed)
LVMOM for pt to return my call into the office. TL/CMA

## 2016-05-07 NOTE — Telephone Encounter (Signed)
Forms faxed on 05/06/2016. TL/CMA

## 2016-05-08 ENCOUNTER — Other Ambulatory Visit: Payer: Self-pay | Admitting: Family Medicine

## 2016-05-08 ENCOUNTER — Telehealth: Payer: Self-pay | Admitting: Family Medicine

## 2016-05-08 NOTE — Telephone Encounter (Signed)
Form completed and faxed.  Faxed confirmation received.  Natalie Lam states she has all the information she needs.

## 2016-05-08 NOTE — Telephone Encounter (Signed)
Debbie with [facility] is calling regarding patient moving into the facility. She would like a return call. Please advise   Debbie phone: [phone]

## 2016-05-08 NOTE — Telephone Encounter (Signed)
Patient's daughter is calling regarding the Fl2 forms. She states that Natalie Lam did not receive the forms yet. Patient's daughter is a little frantic... She states that she only has one more hour to get these forms to her before her mother has nowhere to stay tomorrow. Please advise   Fax: [phone]

## 2016-05-08 NOTE — Telephone Encounter (Signed)
Andree Elk called back to follow up on the status of the FL2 form. States she needs the completed form by 3PM today so that the patient can be admitted tomorrow. States that Jonelle Sidle Long has the form and she can fax it again if she needs to. Plse adv.  [phone] Jackelyn Poling)

## 2016-05-09 NOTE — Telephone Encounter (Signed)
See phone note dated 05/03/16.

## 2016-05-10 ENCOUNTER — Telehealth: Payer: Self-pay | Admitting: Neurology

## 2016-05-10 ENCOUNTER — Telehealth: Payer: Self-pay | Admitting: Family Medicine

## 2016-05-10 NOTE — Telephone Encounter (Signed)
Per note from today Dr. Carles Collet, neurologist, faxed medication list over to assisted living facility.

## 2016-05-10 NOTE — Telephone Encounter (Signed)
Med List and Levodopa dosage faxed to the number provided.

## 2016-05-10 NOTE — Telephone Encounter (Signed)
Patient's daughter called stating that the patient's medication is written wrong on the Fl2 form. Patient's daughter states carbidopa-levodopa (SINEMET IR) 25-100 MG tablet should read 1.5 tablets 3x daily, instead of 1 tablet 3x daily. Please advise

## 2016-05-10 NOTE — Telephone Encounter (Signed)
PT's POA Tonya called and said she moved to [facility] and she needs the orders for Carbidopa faxed to them, she said it is an emergency to have this done/Dawn [phone]

## 2016-05-10 NOTE — Telephone Encounter (Signed)
Called Dr. Doristine Devoid office to verify dose.  Office was close until Monday.

## 2016-05-14 NOTE — Telephone Encounter (Signed)
Please call [name] @ [facility] to clarify patients medications. [phone]

## 2016-05-15 ENCOUNTER — Telehealth: Payer: Self-pay | Admitting: Neurology

## 2016-05-15 DIAGNOSIS — N390 Urinary tract infection, site not specified: Secondary | ICD-10-CM | POA: Diagnosis not present

## 2016-05-15 NOTE — Telephone Encounter (Signed)
Received call via answering service at 5:27 pm last night.  Answering service said that pts daughter very upset that SNF not giving meds properly and that I had said to do 1.5 tablets of "dopamine" tid and only getting 1 po tid.  Pt hallucinating.  Told answering service that those were 2 different issues.  If issue was about dispensing correct dose of medication then needed to talk with SNF (although now that I look at notes I think that she is getting proper dose).  However, I told him that if pt hallucinating, more levodopa could make that worse.  If acute, go to ER.  If not, call during normal business hours.  RN implied daughter had but I only see requests to fax orders to living facility and no concerns.  Luvenia Starch, please call daughter.  She likely needs labs first if this was fast onset and can see PCP at facility.

## 2016-05-15 NOTE — Telephone Encounter (Signed)
Left message on machine for patient's daughter to call back.   

## 2016-05-15 NOTE — Telephone Encounter (Signed)
Spoke with patient's daughter and advised since she is having hallucinations that we would not want to increase Levodopa back up to 1.5 TID as this can contribute to increase in hallucinations.  We will send order.  Hallucinations started Sunday. Feels like she is "in a dream" but states she is not dreaming. The daughter and her husband are unsure if she is sleeping in these episodes and if these are vivid dreams or hallucinations, but patient insists she isn't sleeping.  Made daughter aware that sudden onset of hallucinations could be due to infection and needs to see medical provider first.  The patient's daughter has a lot going on at the moment and asks if we can please order a UA to be done at assisted living as she can not get her to the doctor today.  Please advise.

## 2016-05-15 NOTE — Telephone Encounter (Signed)
Before I call daughter, please verify correct Levodopa dosage. Last note unclear.

## 2016-05-15 NOTE — Telephone Encounter (Signed)
Natalie Lam 04/11/1932. [name] with [facility] called regarding some labs that were called in? Her # is [phone]. She would like you to call her. Thank you

## 2016-05-15 NOTE — Telephone Encounter (Signed)
Okay for CBC, chem, UA with c&S

## 2016-05-15 NOTE — Telephone Encounter (Signed)
Order faxed to [facility]. Patient's daughter made aware.

## 2016-05-16 ENCOUNTER — Ambulatory Visit (INDEPENDENT_AMBULATORY_CARE_PROVIDER_SITE_OTHER): Payer: Medicare Other | Admitting: Family Medicine

## 2016-05-16 ENCOUNTER — Encounter: Payer: Self-pay | Admitting: Family Medicine

## 2016-05-16 VITALS — BP 154/92 | HR 74 | Temp 98.2°F | Ht 60.0 in | Wt 122.8 lb

## 2016-05-16 DIAGNOSIS — G20 Parkinson's disease: Secondary | ICD-10-CM | POA: Diagnosis not present

## 2016-05-16 DIAGNOSIS — R5381 Other malaise: Secondary | ICD-10-CM

## 2016-05-16 DIAGNOSIS — F518 Other sleep disorders not due to a substance or known physiological condition: Secondary | ICD-10-CM | POA: Diagnosis not present

## 2016-05-16 DIAGNOSIS — R6889 Other general symptoms and signs: Secondary | ICD-10-CM

## 2016-05-16 DIAGNOSIS — N3000 Acute cystitis without hematuria: Secondary | ICD-10-CM

## 2016-05-16 LAB — POC URINALSYSI DIPSTICK (AUTOMATED)
BILIRUBIN UA: NEGATIVE
Blood, UA: NEGATIVE
Glucose, UA: NEGATIVE
KETONES UA: NEGATIVE
Leukocytes, UA: NEGATIVE
Nitrite, UA: NEGATIVE
PROTEIN UA: NEGATIVE
SPEC GRAV UA: 1.025
Urobilinogen, UA: 0.2
pH, UA: 6

## 2016-05-16 NOTE — Progress Notes (Signed)
Pre visit review using our clinic review tool, if applicable. No additional management support is needed unless otherwise documented below in the visit note. 

## 2016-05-16 NOTE — Patient Instructions (Signed)
Your urinalysis is negative and not suggestive of infection.  PT ordered for your home. Let us know if you need another type of order.

## 2016-05-16 NOTE — Progress Notes (Signed)
Chief Complaint  Patient presents with  . Urinary Tract Infection    Pt daughter reports vivid dreams x4 nights ago and has not went away     Subjective: Patient is a 82 y.o. female here for lucid dreams.  Over the past week, the pt has been having lucid dreams. She recently moved to a new group home 1 week ago. She called in and was told it could be a UTI and wants to rule that out. Denies fevers, pain with urination, frequency, abdominal pain, or pain with urination. She also denies cough, shortness of breath, rashes, diarrhea, or pain anywhere.  Her daughter brought up that her new group home offers PT. She is interested in having this for the patient due to her Parkinson's Disease and deconditioning.   ROS: GU: As noted in HPI Lungs: Denies SOB or cough  Family History  Problem Relation Age of Onset  . Dementia Mother     Alzheimers  . Stroke Mother     ? 65  . Cancer Son     multiple myeloma  . Pancreatic cancer Son   . Heart disease Brother   . Lung cancer Maternal Uncle     smoker  . Heart attack Neg Hx   . Diabetes Neg Hx   . Hearing loss Neg Hx    Past Medical History:  Diagnosis Date  . Aortic stenosis, mild   . Gilbert syndrome   . HTN (hypertension)   . Hyperlipidemia   . Hypothyroidism   . Melanoma ([place])   . Neuropathy ([place])   . Osteoporosis   . Parkinson's disease ([place])   . Peripheral neuropathy ([facility])    Bilateral  . Rheumatic fever    does not take Flu shot    Allergies  Allergen Reactions  . Sulfonamide Derivatives     REACTION:generalized  swelling  . Sulfa Antibiotics Swelling    Current Outpatient Prescriptions:  .  Calcium Carbonate-Vitamin D (CALCIUM 600/VITAMIN D PO), Take 1 tablet by mouth daily. , Disp: , Rfl:  .  carbidopa-levodopa (SINEMET IR) 25-100 MG tablet, Take 1 tablet by mouth 3 (three) times daily. , Disp: , Rfl: 0 .  Cholecalciferol (VITAMIN D) 2000 UNITS CAPS, Take 1 capsule (2,000 Units total) by mouth daily., Disp: 30  capsule, Rfl: 3 .  levothyroxine (SYNTHROID, LEVOTHROID) 50 MCG tablet, Take one on Sunday, Monday, Wednesday, and Friday, Disp: , Rfl:  .  levothyroxine (SYNTHROID, LEVOTHROID) 75 MCG tablet, Take one on Tuesday, Thursday, and Saturday., Disp: , Rfl: 0 .  magnesium citrate SOLN, 1 bottle if no bowel movement in 3 days., Disp: 195 mL, Rfl:  .  Multiple Vitamin (MULTIVITAMIN) capsule, Take 1 capsule by mouth daily.  , Disp: , Rfl:  .  omeprazole (PRILOSEC) 20 MG capsule, Take 20 mg by mouth daily. , Disp: , Rfl: 1 .  polyethylene glycol powder (GLYCOLAX/MIRALAX) powder, 17 g (1 dose) daily, may add 2nd dose as needed., Disp: 3350 g, Rfl: 1 .  Scar Treatment Products ([place] SPF 30 EX), Apply topically., Disp: , Rfl:   Objective: BP (!) 154/92 (BP Location: Right Arm, Patient Position: Sitting, Cuff Size: Small)   Pulse 74   Temp 98.2 F (36.8 C) (Oral)   Ht 5' (1.524 m)   Wt 122 lb 12.8 oz (55.7 kg)   SpO2 96%   BMI 23.98 kg/m  General: Awake, appears stated age HEENT: MMM Heart: RRR, 2/6 SEM heard loudest at aortic listening post, no LE edema  noted today Lungs: CTAB, no rales, wheezes or rhonchi. No accessory muscle use Abd: BS+, soft, NT, mild distension, no masses or organomegaly Psych: Age appropriate judgment and insight, normal affect and mood  Assessment and Plan: Vivid dream  Physical deconditioning  Parkinson's disease ([place])   UA not suggestive of infection. No signs of infection anywhere in fact.  Order for PT in her group home written out for #2 and #3. She has f/u with Neurology, may need to adjust PD meds, though I will not be the one to initiate this, which her daughter understands. BP noted. I do not believe her life expectancy warrants more aggressive lower at this time. F/u with movement specialist as originally scheduled. The patient and her daughter voiced understanding and agreement to the plan.  [place], DO 05/16/16  10:57 AM

## 2016-05-16 NOTE — Telephone Encounter (Signed)
Caller name: Rebeca Alert Relation to pt: Med Tech from [facility]  Call back number: [phone] fax # [phone]    Reason for call:  Clarification on med list what medications need to be d/c and continued, please advise

## 2016-05-16 NOTE — Telephone Encounter (Signed)
Faxed confirmation to [facility]. TL/CMA

## 2016-05-16 NOTE — Addendum Note (Signed)
Addended by: Conway Cellar on: 05/16/2016 03:36 PM   Modules accepted: Orders

## 2016-05-16 NOTE — Telephone Encounter (Signed)
Gave ICD 10 codes for lab work

## 2016-05-16 NOTE — Telephone Encounter (Signed)
[place] request call back about FL2 form. Please call Nambe @ [phone]

## 2016-05-17 DIAGNOSIS — Z79899 Other long term (current) drug therapy: Secondary | ICD-10-CM | POA: Diagnosis not present

## 2016-05-17 DIAGNOSIS — Z9181 History of falling: Secondary | ICD-10-CM | POA: Diagnosis not present

## 2016-05-17 DIAGNOSIS — R5381 Other malaise: Secondary | ICD-10-CM | POA: Diagnosis not present

## 2016-05-17 DIAGNOSIS — G20 Parkinson's disease: Secondary | ICD-10-CM | POA: Diagnosis not present

## 2016-05-21 ENCOUNTER — Telehealth: Payer: Self-pay | Admitting: Family Medicine

## 2016-05-21 NOTE — Telephone Encounter (Signed)
Sharyn Lull - PT - [phone]   Called In for verbal orders to work with pt for Balance, Safety and mobility for 2/3 times a week for 7 weeks.

## 2016-05-21 NOTE — Telephone Encounter (Signed)
Please advise for recommendations. TL/CMA

## 2016-05-21 NOTE — Telephone Encounter (Signed)
OK to do-

## 2016-05-22 DIAGNOSIS — Z79899 Other long term (current) drug therapy: Secondary | ICD-10-CM | POA: Diagnosis not present

## 2016-05-22 DIAGNOSIS — R5381 Other malaise: Secondary | ICD-10-CM | POA: Diagnosis not present

## 2016-05-22 DIAGNOSIS — Z9181 History of falling: Secondary | ICD-10-CM | POA: Diagnosis not present

## 2016-05-22 DIAGNOSIS — G20 Parkinson's disease: Secondary | ICD-10-CM | POA: Diagnosis not present

## 2016-05-22 NOTE — Telephone Encounter (Signed)
I have spoken with Natalie Lam and informed her of Dr. Nani Ravens recommedations to move forward with verbal orders. TL/CMA

## 2016-05-22 NOTE — Telephone Encounter (Signed)
I have spoken with [name] to confirm if she received the fax regarding medication list. [name] could not find fax but did confirm other faxes attached to them were received. I have refaxed confirmation of medication list. TL/CMA

## 2016-05-24 DIAGNOSIS — Z79899 Other long term (current) drug therapy: Secondary | ICD-10-CM | POA: Diagnosis not present

## 2016-05-24 DIAGNOSIS — Z9181 History of falling: Secondary | ICD-10-CM | POA: Diagnosis not present

## 2016-05-24 DIAGNOSIS — R5381 Other malaise: Secondary | ICD-10-CM | POA: Diagnosis not present

## 2016-05-24 DIAGNOSIS — G20 Parkinson's disease: Secondary | ICD-10-CM | POA: Diagnosis not present

## 2016-05-28 ENCOUNTER — Telehealth: Payer: Self-pay | Admitting: Family Medicine

## 2016-05-28 DIAGNOSIS — G20 Parkinson's disease: Secondary | ICD-10-CM | POA: Diagnosis not present

## 2016-05-28 DIAGNOSIS — Z79899 Other long term (current) drug therapy: Secondary | ICD-10-CM | POA: Diagnosis not present

## 2016-05-28 DIAGNOSIS — R5381 Other malaise: Secondary | ICD-10-CM | POA: Diagnosis not present

## 2016-05-28 DIAGNOSIS — Z9181 History of falling: Secondary | ICD-10-CM | POA: Diagnosis not present

## 2016-05-28 NOTE — Telephone Encounter (Signed)
[name] with [facility] Phone: [phone] Fax: [phone]  Physicians order forms were faxed today 05/28/16 to [phone]. These need signed and returned asap for med orders/refills.

## 2016-05-29 NOTE — Telephone Encounter (Signed)
I have alerted [name] we have not received forms yet and will complete and refax once received. TL/CMA

## 2016-05-30 DIAGNOSIS — Z9181 History of falling: Secondary | ICD-10-CM | POA: Diagnosis not present

## 2016-05-30 DIAGNOSIS — G20 Parkinson's disease: Secondary | ICD-10-CM | POA: Diagnosis not present

## 2016-05-30 DIAGNOSIS — R5381 Other malaise: Secondary | ICD-10-CM | POA: Diagnosis not present

## 2016-05-30 DIAGNOSIS — Z79899 Other long term (current) drug therapy: Secondary | ICD-10-CM | POA: Diagnosis not present

## 2016-05-30 NOTE — Telephone Encounter (Signed)
Forms faxed. TL/CMA

## 2016-05-31 DIAGNOSIS — Z79899 Other long term (current) drug therapy: Secondary | ICD-10-CM | POA: Diagnosis not present

## 2016-05-31 DIAGNOSIS — Z9181 History of falling: Secondary | ICD-10-CM | POA: Diagnosis not present

## 2016-05-31 DIAGNOSIS — R5381 Other malaise: Secondary | ICD-10-CM | POA: Diagnosis not present

## 2016-05-31 DIAGNOSIS — G20 Parkinson's disease: Secondary | ICD-10-CM | POA: Diagnosis not present

## 2016-06-03 DIAGNOSIS — Z9181 History of falling: Secondary | ICD-10-CM | POA: Diagnosis not present

## 2016-06-03 DIAGNOSIS — Z79899 Other long term (current) drug therapy: Secondary | ICD-10-CM | POA: Diagnosis not present

## 2016-06-03 DIAGNOSIS — R5381 Other malaise: Secondary | ICD-10-CM | POA: Diagnosis not present

## 2016-06-03 DIAGNOSIS — G20 Parkinson's disease: Secondary | ICD-10-CM | POA: Diagnosis not present

## 2016-06-03 NOTE — Telephone Encounter (Signed)
Natalie Lam called stating that the forms that were faxed over were not legible and it did not include the providers NPI number. Please advise.   Fax: [phone] Phone: [phone]

## 2016-06-04 NOTE — Telephone Encounter (Signed)
Refaxed form. TL/CMA

## 2016-06-05 DIAGNOSIS — Z9181 History of falling: Secondary | ICD-10-CM | POA: Diagnosis not present

## 2016-06-05 DIAGNOSIS — R5381 Other malaise: Secondary | ICD-10-CM | POA: Diagnosis not present

## 2016-06-05 DIAGNOSIS — Z79899 Other long term (current) drug therapy: Secondary | ICD-10-CM | POA: Diagnosis not present

## 2016-06-05 DIAGNOSIS — G20 Parkinson's disease: Secondary | ICD-10-CM | POA: Diagnosis not present

## 2016-06-07 DIAGNOSIS — G20 Parkinson's disease: Secondary | ICD-10-CM | POA: Diagnosis not present

## 2016-06-07 DIAGNOSIS — Z9181 History of falling: Secondary | ICD-10-CM | POA: Diagnosis not present

## 2016-06-07 DIAGNOSIS — R5381 Other malaise: Secondary | ICD-10-CM | POA: Diagnosis not present

## 2016-06-07 DIAGNOSIS — Z79899 Other long term (current) drug therapy: Secondary | ICD-10-CM | POA: Diagnosis not present

## 2016-06-11 DIAGNOSIS — Z9181 History of falling: Secondary | ICD-10-CM | POA: Diagnosis not present

## 2016-06-11 DIAGNOSIS — R5381 Other malaise: Secondary | ICD-10-CM | POA: Diagnosis not present

## 2016-06-11 DIAGNOSIS — Z79899 Other long term (current) drug therapy: Secondary | ICD-10-CM | POA: Diagnosis not present

## 2016-06-11 DIAGNOSIS — G20 Parkinson's disease: Secondary | ICD-10-CM | POA: Diagnosis not present

## 2016-06-13 DIAGNOSIS — G20 Parkinson's disease: Secondary | ICD-10-CM | POA: Diagnosis not present

## 2016-06-13 DIAGNOSIS — Z9181 History of falling: Secondary | ICD-10-CM | POA: Diagnosis not present

## 2016-06-13 DIAGNOSIS — R5381 Other malaise: Secondary | ICD-10-CM | POA: Diagnosis not present

## 2016-06-13 DIAGNOSIS — Z79899 Other long term (current) drug therapy: Secondary | ICD-10-CM | POA: Diagnosis not present

## 2016-06-14 ENCOUNTER — Other Ambulatory Visit: Payer: Self-pay

## 2016-06-14 DIAGNOSIS — Z79899 Other long term (current) drug therapy: Secondary | ICD-10-CM | POA: Diagnosis not present

## 2016-06-14 DIAGNOSIS — Z9181 History of falling: Secondary | ICD-10-CM | POA: Diagnosis not present

## 2016-06-14 DIAGNOSIS — R5381 Other malaise: Secondary | ICD-10-CM | POA: Diagnosis not present

## 2016-06-14 DIAGNOSIS — G20 Parkinson's disease: Secondary | ICD-10-CM | POA: Diagnosis not present

## 2016-06-17 ENCOUNTER — Telehealth: Payer: Self-pay | Admitting: Family Medicine

## 2016-06-17 NOTE — Telephone Encounter (Signed)
OK 

## 2016-06-17 NOTE — Telephone Encounter (Signed)
Caller name: Sharyn Lull  Relation to pt: PT from [facility]  Call back number: [phone]    Reason for call:  Requesting verbal orders for PT to continue 3x 1 for another month and speech therapy consult orders to work on memory.

## 2016-06-17 NOTE — Telephone Encounter (Signed)
Left detailed message on below voicemail and to call if any questions.

## 2016-06-18 DIAGNOSIS — G20 Parkinson's disease: Secondary | ICD-10-CM | POA: Diagnosis not present

## 2016-06-18 DIAGNOSIS — Z9181 History of falling: Secondary | ICD-10-CM | POA: Diagnosis not present

## 2016-06-18 DIAGNOSIS — Z79899 Other long term (current) drug therapy: Secondary | ICD-10-CM | POA: Diagnosis not present

## 2016-06-18 DIAGNOSIS — R5381 Other malaise: Secondary | ICD-10-CM | POA: Diagnosis not present

## 2016-06-18 NOTE — Progress Notes (Signed)
Natalie Lam was seen today in the movement disorders clinic for neurologic consultation at the request of Shelda Pal, DO.  The patient is accompanied by her daughter who supplements the history.  Prior records made available to me were reviewed.  The patient has a history of gait instability.  Pts sx's started in March, 2016.  Pt thinks that it started quickly.  She remembers one particular day in March that she went to [facility] and she backed into the space and she realized that she tried to walk into [employer] and just felt that she would fall if she didn't get back in the car.  She had a near syncopal episode which was later determined due to HTN and mild AS.  After that, she seemed to go slowly down hill.  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  She had PT in May which helped her back but didn't really seem to help the gait.  Pt is getting ready to move to [facility] assisted living.  Pt states that she just has a "fear of falling because so many people my age do."  Admits that depression contributes to sx's but states that doing better than she was.  Pt denies lateralizing weakness/paresthesias.  Denies speech change but states that she "tires easily."  Denies feet paresthesias.      Specific Symptoms:  Tremor: No. Voice: denies hypophonic speech Sleep: sleeps well  Vivid Dreams:  Yes.   (only if eats at night)  Acting out dreams:  No. Wet Pillows: No. Postural symptoms:  Yes.   (no cane/walker to ambulate)  Falls?  No. Bradykinesia symptoms: slow movements Loss of smell:  No. Loss of taste:  No. Urinary Incontinence:  No. Difficulty Swallowing:  No. Handwriting, micrographia: No. (perhaps a little smaller) Trouble with ADL's:  No.  Trouble buttoning clothing: No. Depression:  Yes.   (multiple deaths in family) Memory changes:  Yes.   (but  complicated by deaths, moving, closing family business; still drives in familiar territory and parks far away to avoid other cars; pays monthly bills without issue; cooks for self until moves to [facility]) Hallucinations:  No.  visual distortions: No. N/V:  No. Lightheaded:  No.   Neuroimaging has previously been performed.  It is  available for my review today.  A CT of the brain was performed on 08/12/2014 after near syncopal episode.  I reviewed this.  There is atrophy and rather significant cervical small vessel disease.  A carotid ultrasound was performed on 09/06/2014.  This demonstrated 1-49% stenosis bilaterally.  09/13/15 update:  The patient is following up today, accompanied by her daughter who supplements the history.  I have not seen her since July, 2016 and that was only a one time visit.  She does have a history of peripheral neuropathy, which has led to gait instability.  Her peripheral neuropathy has also caused paresthesias in the feet and according to records she was started on gabapentin by the nurse practitioner at [facility] in October, 2016.  She states that she is f/u due to trouble walking for about 6 weeks.  Her daughter is a Marine scientist and agrees that the majority of the issue started about 6 weeks ago, with no new med changes.  Pt states that she is getting stuck in doorways.  "Once I get going, I am okay."  11/24/15 update:  Pt  is f/u today re: newly dx PD last visit. This patient is accompanied in the office by her child who supplements the history.  Reviewed records since last visit.  On levodopa tid now.  Tolerating it well.  Feels that she is doing much better.  States that she feels stronger in the legs and "I don't have to work to get across the doorway."  No hallucinations.  No lightheaded/near syncope.  No falls since last visit.  Did PT/OT at [facility] and feels it helped.  Uses rollator.    02/26/16 update:  The patient follows up regarding her Parkinson's disease.  She  remains on carbidopa/levodopa 25/100, one tablet 3 times per day.  She reports that she has been doing well.  No falls.  No lightheadedness or near syncope.  She is using her Rollator faithfully but "with my wheels I can travel."  She did go to the emergency room with abdominal pain, which was ultimately felt due to constipation.  She is taking MiraLAX but doesn't use it daily.  She is still having trouble balancing constipation with diarrhea.  Daughter that Baker Pierini was started almost a month ago.  She is on 41mg once per day.  Because of intermittent diarrhea, she has cut back on exercise some.  Lives in assisted living and gets assist with medication, bath, dressing (putting on hose especially).  Meals prepared.  She is at [facility]    06/20/16 update:  Patient follows up today, on carbidopa/levodopa 25/100, 1 tablet 3 times a day.  She is accompanied by her granddaughter who supplements the history.  Patient moved from [facility] to [facility] since our last visit.  When she moved, her levodopa was decreased from 1-1/2 tablets to 1 tablet 3 times per day.  Her daughter ended up calling me about this, and I left it at that dose because her daughter also stated that she was having hallucinations.  It was unclear if these were actual hallucinations, or if these were arising out of sleep.  She followed up with her primary care physician.  A urinalysis was unremarkable.  Denies any hallucination or visual distortions today.  Daughter states having paranoia.  Not sleeping well because she worries that there are things going on outside her room (pt denies this but daughter relates that).  daughter relates that if door closes she becomes hyperfocused on why it closed.  Pt relates that it is much better now as she has gotten used to the noises of the new facility.  Working with PT.  Daughter concerned that PT goal is to get her off of the walker.   ALLERGIES:   Allergies  Allergen Reactions  . Sulfonamide  Derivatives     REACTION:generalized  swelling  . Sulfa Antibiotics Swelling    CURRENT MEDICATIONS:  Outpatient Encounter Prescriptions as of 06/20/2016  Medication Sig  . Calcium Carbonate-Vitamin D (CALCIUM 600/VITAMIN D PO) Take 1 tablet by mouth daily.   . carbidopa-levodopa (SINEMET IR) 25-100 MG tablet Take 1 tablet by mouth 3 (three) times daily.   . Cholecalciferol (VITAMIN D) 2000 UNITS CAPS Take 1 capsule (2,000 Units total) by mouth daily.  .Marland Kitchenlevothyroxine (SYNTHROID, LEVOTHROID) 50 MCG tablet Take one on Sunday, Monday, Wednesday, and Friday  . levothyroxine (SYNTHROID, LEVOTHROID) 75 MCG tablet Take one on Tuesday, Thursday, and Saturday.  . magnesium citrate SOLN 1 bottle if no bowel movement in 3 days.  . Multiple Vitamin (MULTIVITAMIN) capsule Take 1 capsule by mouth daily.    .Marland Kitchen  omeprazole (PRILOSEC) 20 MG capsule Take 20 mg by mouth daily.   . polyethylene glycol powder (GLYCOLAX/MIRALAX) powder 17 g (1 dose) daily, may add 2nd dose as needed.  . Scar Treatment Products ([place] SPF 30 EX) Apply topically.   No facility-administered encounter medications on file as of 06/20/2016.     PAST MEDICAL HISTORY:   Past Medical History:  Diagnosis Date  . Aortic stenosis, mild   . Gilbert syndrome   . HTN (hypertension)   . Hyperlipidemia   . Hypothyroidism   . Melanoma ([place])   . Neuropathy ([place])   . Osteoporosis   . Parkinson's disease ([place])   . Peripheral neuropathy ([facility])    Bilateral  . Rheumatic fever    does not take Flu shot     PAST SURGICAL HISTORY:   Past Surgical History:  Procedure Laterality Date  . ABDOMINAL HYSTERECTOMY  1963   no BSO; no abnormal PAPs  . COLONOSCOPY  2000   negative; [place]  . [place]  . MELANOMA EXCISION  08/2014    SOCIAL HISTORY:   Social History   Social History  . Marital status: Widowed    Spouse name: N/A  . Number of children: N/A  . Years of education: N/A   Occupational History  . 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 Smoking status: Never Smoker  . Smokeless tobacco: Never Used  . Alcohol use No  . Drug use: No  . Sexual activity: Not on file   Other Topics Concern  . Not on file   Social History Narrative   Lives at [facility] 01/05/2015   Widowed   Never smoked   Exercise walking   Alcohol none   POA/Living Will          FAMILY HISTORY:   Family Status  Relation Status  . Mother Deceased   alzheimers, stroke  . Son Deceased   multiple myeloma  . Son Deceased   pancreatic cancer  . Father Deceased   unknown  . Brother Alive  . Brother Deceased  . Sister Alive  . Sister Alive  . Daughter Alive  . Daughter Deceased   decapitated in [place]  . Maternal Uncle   . Neg Hx     ROS:   A complete 10 system review of systems was obtained and was unremarkable apart from what is mentioned above.  PHYSICAL EXAMINATION:    VITALS:   Vitals:   06/20/16 1118  BP: (!) 156/70  Pulse: 64  Weight: 125 lb (56.7 kg)  Height: 5' (1.524 m)    GEN:  The patient appears stated age and is in NAD. HEENT:  Normocephalic, atraumatic.  The mucous membranes are moist. The superficial temporal arteries are without ropiness or tenderness. CV:  RRR with 3/6 SEM Lungs:  CTAB Neck/HEME:  There are no carotid bruits bilaterally.  Neurological examination:  Orientation: Had trouble with trail making.  Did well with orientation. Montreal Cognitive Assessment  12/13/2014  Visuospatial/ Executive (0/5) 3  Naming (0/3) 2  Attention: Read list of digits (0/2) 2  Attention: Read list of letters (0/1) 1  Attention: Serial 7 subtraction starting at 100 (0/3) 1  Language: Repeat phrase (0/2) 2  Language : Fluency (0/1) 1  Abstraction (0/2) 2  Delayed Recall (0/5) 0  Orientation (0/6) 6  Total 20  Adjusted Score (based on education) 21    Cranial nerves: There is good  facial symmetry.  Extraocular muscles are intact. The visual  fields are full to confrontational testing. The speech is fluent and clear. Soft palate rises symmetrically and there is no tongue deviation. Hearing is intact to conversational tone. Sensation: Sensation is intact to light touch throughout Motor: Strength is 5/5 in the bilateral upper and lower extremities.   Shoulder shrug is equal and symmetric.  There is no pronator drift.  Movement examination: Tone: There is normal tone bilaterally Abnormal movements: None Coordination:  There is decremation with finger taps, toe taps bilaterally and with heel taps on the L Gait and Station: The patient pushes off of the chair to get off.  She has start hesitation.  She does hesitate in the doorway.  She intermittently slows just for a second and momentarily freezes.  She slightly drags the L leg with ambulation.  Labs:  Lab Results  Component Value Date   [id number] 233 08/11/2014   No results found for: HGBA1C    ASSESSMENT/PLAN:  1.  Parkinson's disease  -dx formally in 08/2015  -We discussed the diagnosis as well as pathophysiology of the disease.  We discussed treatment options as well as prognostic indicators.  Patient education was provided.  -continue carbidopa/levodopa 25/100 tid .  Higher dosage may have caused hallucinations.  -working with PT.  Needs to use walker at all times.  Asked PT to try regular walker instead of rollator as she festinates.  -she brought DNR orders previously and I put them in the system again today as they seem to have disappeared after last time.  I asked her about that today again and she/daughter confirmed wishes.  -having some parkinsons related dementia and paranoia and discussed nuplazid and even depakote given associated agitation.  However, sounds like these sx's are actually getting much better so decided to hold on that.  2.  Melanoma  -discussed with the patient that PD increases risk of melanoma and just had one removed and sees [facility]  dermatology.    3.  Constipation  -doing much better and managing with prune juice and applesauce  4.  Follow up is anticipated in the next few months, sooner should new neurologic issues arise.  Much greater than 50% of this visit was spent in counseling and coordinating care.  Total face to face time:  35 min

## 2016-06-19 ENCOUNTER — Encounter: Payer: Self-pay | Admitting: Internal Medicine

## 2016-06-19 ENCOUNTER — Telehealth: Payer: Self-pay | Admitting: Family Medicine

## 2016-06-19 DIAGNOSIS — Z9181 History of falling: Secondary | ICD-10-CM | POA: Diagnosis not present

## 2016-06-19 DIAGNOSIS — Z79899 Other long term (current) drug therapy: Secondary | ICD-10-CM | POA: Diagnosis not present

## 2016-06-19 DIAGNOSIS — R5381 Other malaise: Secondary | ICD-10-CM | POA: Diagnosis not present

## 2016-06-19 DIAGNOSIS — G20 Parkinson's disease: Secondary | ICD-10-CM | POA: Diagnosis not present

## 2016-06-19 NOTE — Telephone Encounter (Signed)
Natalie Lam at [place] at [facility] is calling to get verbal orders for speech therapy 1x for 1 week and 2x a week for 2 weeks.    Natalie Lam Phone: [phone]

## 2016-06-20 ENCOUNTER — Ambulatory Visit (INDEPENDENT_AMBULATORY_CARE_PROVIDER_SITE_OTHER): Payer: Medicare Other | Admitting: Neurology

## 2016-06-20 ENCOUNTER — Encounter: Payer: Self-pay | Admitting: Neurology

## 2016-06-20 VITALS — BP 156/70 | HR 64 | Ht 60.0 in | Wt 125.0 lb

## 2016-06-20 DIAGNOSIS — F0280 Dementia in other diseases classified elsewhere without behavioral disturbance: Secondary | ICD-10-CM

## 2016-06-20 DIAGNOSIS — G20 Parkinson's disease: Secondary | ICD-10-CM

## 2016-06-20 NOTE — Telephone Encounter (Signed)
HHRN informed of verbal ok. 

## 2016-06-20 NOTE — Patient Instructions (Addendum)
Pt needs to use walker at all times, even with physical therapy.  Physical therapy may need to trial walker with 2 wheels and tennis balls as opposed to rollator to see if helps festinating gait.

## 2016-06-20 NOTE — Telephone Encounter (Signed)
OK 

## 2016-06-21 DIAGNOSIS — G20 Parkinson's disease: Secondary | ICD-10-CM | POA: Diagnosis not present

## 2016-06-21 DIAGNOSIS — Z9181 History of falling: Secondary | ICD-10-CM | POA: Diagnosis not present

## 2016-06-21 DIAGNOSIS — R5381 Other malaise: Secondary | ICD-10-CM | POA: Diagnosis not present

## 2016-06-21 DIAGNOSIS — Z79899 Other long term (current) drug therapy: Secondary | ICD-10-CM | POA: Diagnosis not present

## 2016-06-24 ENCOUNTER — Telehealth: Payer: Self-pay | Admitting: Neurology

## 2016-06-24 DIAGNOSIS — R5381 Other malaise: Secondary | ICD-10-CM | POA: Diagnosis not present

## 2016-06-24 DIAGNOSIS — G20 Parkinson's disease: Secondary | ICD-10-CM | POA: Diagnosis not present

## 2016-06-24 DIAGNOSIS — Z79899 Other long term (current) drug therapy: Secondary | ICD-10-CM | POA: Diagnosis not present

## 2016-06-24 DIAGNOSIS — Z9181 History of falling: Secondary | ICD-10-CM | POA: Diagnosis not present

## 2016-06-24 NOTE — Telephone Encounter (Signed)
RX and last office note faxed to [phone] with confirmation received.

## 2016-06-24 NOTE — Telephone Encounter (Signed)
Patient daughter needs [place] to fax a rx for walker to [facility] [phone] and it needs to states that when she is out with her daughter she can use the wheels but she needs to be closer when not with daughter. Plus they need the last office note. Patient daughter phone number is [phone]

## 2016-06-24 NOTE — Telephone Encounter (Signed)
Patient just needs RX for walker with no wheels.

## 2016-06-24 NOTE — Telephone Encounter (Signed)
LMOM for patient's daughter to call back for clarification on what she needs.

## 2016-06-26 DIAGNOSIS — R5381 Other malaise: Secondary | ICD-10-CM | POA: Diagnosis not present

## 2016-06-26 DIAGNOSIS — Z9181 History of falling: Secondary | ICD-10-CM | POA: Diagnosis not present

## 2016-06-26 DIAGNOSIS — G20 Parkinson's disease: Secondary | ICD-10-CM | POA: Diagnosis not present

## 2016-06-26 DIAGNOSIS — Z79899 Other long term (current) drug therapy: Secondary | ICD-10-CM | POA: Diagnosis not present

## 2016-06-27 DIAGNOSIS — R5381 Other malaise: Secondary | ICD-10-CM | POA: Diagnosis not present

## 2016-06-27 DIAGNOSIS — Z9181 History of falling: Secondary | ICD-10-CM | POA: Diagnosis not present

## 2016-06-27 DIAGNOSIS — G20 Parkinson's disease: Secondary | ICD-10-CM | POA: Diagnosis not present

## 2016-06-27 DIAGNOSIS — Z79899 Other long term (current) drug therapy: Secondary | ICD-10-CM | POA: Diagnosis not present

## 2016-06-28 DIAGNOSIS — R5381 Other malaise: Secondary | ICD-10-CM | POA: Diagnosis not present

## 2016-06-28 DIAGNOSIS — Z79899 Other long term (current) drug therapy: Secondary | ICD-10-CM | POA: Diagnosis not present

## 2016-06-28 DIAGNOSIS — G20 Parkinson's disease: Secondary | ICD-10-CM | POA: Diagnosis not present

## 2016-06-28 DIAGNOSIS — Z9181 History of falling: Secondary | ICD-10-CM | POA: Diagnosis not present

## 2016-07-01 DIAGNOSIS — Z79899 Other long term (current) drug therapy: Secondary | ICD-10-CM | POA: Diagnosis not present

## 2016-07-01 DIAGNOSIS — G20 Parkinson's disease: Secondary | ICD-10-CM | POA: Diagnosis not present

## 2016-07-01 DIAGNOSIS — R5381 Other malaise: Secondary | ICD-10-CM | POA: Diagnosis not present

## 2016-07-01 DIAGNOSIS — Z9181 History of falling: Secondary | ICD-10-CM | POA: Diagnosis not present

## 2016-07-02 DIAGNOSIS — R5381 Other malaise: Secondary | ICD-10-CM | POA: Diagnosis not present

## 2016-07-02 DIAGNOSIS — G20 Parkinson's disease: Secondary | ICD-10-CM | POA: Diagnosis not present

## 2016-07-02 DIAGNOSIS — Z79899 Other long term (current) drug therapy: Secondary | ICD-10-CM | POA: Diagnosis not present

## 2016-07-02 DIAGNOSIS — Z9181 History of falling: Secondary | ICD-10-CM | POA: Diagnosis not present

## 2016-07-03 DIAGNOSIS — Z9181 History of falling: Secondary | ICD-10-CM | POA: Diagnosis not present

## 2016-07-03 DIAGNOSIS — Z79899 Other long term (current) drug therapy: Secondary | ICD-10-CM | POA: Diagnosis not present

## 2016-07-03 DIAGNOSIS — G20 Parkinson's disease: Secondary | ICD-10-CM | POA: Diagnosis not present

## 2016-07-03 DIAGNOSIS — R5381 Other malaise: Secondary | ICD-10-CM | POA: Diagnosis not present

## 2016-07-04 ENCOUNTER — Telehealth: Payer: Self-pay | Admitting: *Deleted

## 2016-07-04 DIAGNOSIS — Z79899 Other long term (current) drug therapy: Secondary | ICD-10-CM | POA: Diagnosis not present

## 2016-07-04 DIAGNOSIS — Z9181 History of falling: Secondary | ICD-10-CM | POA: Diagnosis not present

## 2016-07-04 DIAGNOSIS — R5381 Other malaise: Secondary | ICD-10-CM | POA: Diagnosis not present

## 2016-07-04 DIAGNOSIS — G20 Parkinson's disease: Secondary | ICD-10-CM | POA: Diagnosis not present

## 2016-07-04 NOTE — Telephone Encounter (Addendum)
Received completed and signed Home Health Certification and Plan of Care from Dr. Nani Ravens.  Faxed paperwork to [facility] at [facility] @ [phone]).  Confirmation received.  Sent to be scanned.//AB/CMA

## 2016-07-05 DIAGNOSIS — R5381 Other malaise: Secondary | ICD-10-CM | POA: Diagnosis not present

## 2016-07-05 DIAGNOSIS — Z9181 History of falling: Secondary | ICD-10-CM | POA: Diagnosis not present

## 2016-07-05 DIAGNOSIS — Z79899 Other long term (current) drug therapy: Secondary | ICD-10-CM | POA: Diagnosis not present

## 2016-07-05 DIAGNOSIS — G20 Parkinson's disease: Secondary | ICD-10-CM | POA: Diagnosis not present

## 2016-07-09 DIAGNOSIS — R5381 Other malaise: Secondary | ICD-10-CM | POA: Diagnosis not present

## 2016-07-09 DIAGNOSIS — Z9181 History of falling: Secondary | ICD-10-CM | POA: Diagnosis not present

## 2016-07-09 DIAGNOSIS — G20 Parkinson's disease: Secondary | ICD-10-CM | POA: Diagnosis not present

## 2016-07-09 DIAGNOSIS — Z79899 Other long term (current) drug therapy: Secondary | ICD-10-CM | POA: Diagnosis not present

## 2016-07-10 ENCOUNTER — Ambulatory Visit: Payer: Medicare Other | Admitting: Family Medicine

## 2016-07-10 DIAGNOSIS — G20 Parkinson's disease: Secondary | ICD-10-CM | POA: Diagnosis not present

## 2016-07-10 DIAGNOSIS — Z9181 History of falling: Secondary | ICD-10-CM | POA: Diagnosis not present

## 2016-07-10 DIAGNOSIS — Z79899 Other long term (current) drug therapy: Secondary | ICD-10-CM | POA: Diagnosis not present

## 2016-07-10 DIAGNOSIS — R5381 Other malaise: Secondary | ICD-10-CM | POA: Diagnosis not present

## 2016-07-13 DIAGNOSIS — R5381 Other malaise: Secondary | ICD-10-CM | POA: Diagnosis not present

## 2016-07-13 DIAGNOSIS — Z9181 History of falling: Secondary | ICD-10-CM | POA: Diagnosis not present

## 2016-07-13 DIAGNOSIS — Z79899 Other long term (current) drug therapy: Secondary | ICD-10-CM | POA: Diagnosis not present

## 2016-07-13 DIAGNOSIS — G20 Parkinson's disease: Secondary | ICD-10-CM | POA: Diagnosis not present

## 2016-07-15 ENCOUNTER — Telehealth: Payer: Self-pay | Admitting: Family Medicine

## 2016-07-15 NOTE — Telephone Encounter (Signed)
Please advise. TL/CMA 

## 2016-07-15 NOTE — Telephone Encounter (Signed)
I have alerted michelle Dr. Nani Ravens has ok'd PT orders. TL/CMA

## 2016-07-15 NOTE — Telephone Encounter (Signed)
Caller name: Sharyn Lull  Relation to pt: PT from [facility]  Call back number: [phone]   Reason for call:  Requesting verbal orders to continue home PT 3x 4 2x 4 for balance and mobility training.

## 2016-07-15 NOTE — Telephone Encounter (Signed)
OK 

## 2016-07-16 ENCOUNTER — Telehealth: Payer: Self-pay | Admitting: Neurology

## 2016-07-16 DIAGNOSIS — G629 Polyneuropathy, unspecified: Secondary | ICD-10-CM | POA: Diagnosis not present

## 2016-07-16 DIAGNOSIS — G20 Parkinson's disease: Secondary | ICD-10-CM | POA: Diagnosis not present

## 2016-07-16 DIAGNOSIS — I10 Essential (primary) hypertension: Secondary | ICD-10-CM | POA: Diagnosis not present

## 2016-07-16 DIAGNOSIS — R413 Other amnesia: Secondary | ICD-10-CM | POA: Diagnosis not present

## 2016-07-16 DIAGNOSIS — Z9181 History of falling: Secondary | ICD-10-CM | POA: Diagnosis not present

## 2016-07-16 DIAGNOSIS — Z87440 Personal history of urinary (tract) infections: Secondary | ICD-10-CM | POA: Diagnosis not present

## 2016-07-16 MED ORDER — AMBULATORY NON FORMULARY MEDICATION: 0 refills | Status: DC

## 2016-07-16 NOTE — Telephone Encounter (Signed)
Order for Upright walker requested and faxed to [facility] at [phone].

## 2016-07-17 DIAGNOSIS — G629 Polyneuropathy, unspecified: Secondary | ICD-10-CM | POA: Diagnosis not present

## 2016-07-17 DIAGNOSIS — Z9181 History of falling: Secondary | ICD-10-CM | POA: Diagnosis not present

## 2016-07-17 DIAGNOSIS — I10 Essential (primary) hypertension: Secondary | ICD-10-CM | POA: Diagnosis not present

## 2016-07-17 DIAGNOSIS — R413 Other amnesia: Secondary | ICD-10-CM | POA: Diagnosis not present

## 2016-07-17 DIAGNOSIS — Z87440 Personal history of urinary (tract) infections: Secondary | ICD-10-CM | POA: Diagnosis not present

## 2016-07-17 DIAGNOSIS — G20 Parkinson's disease: Secondary | ICD-10-CM | POA: Diagnosis not present

## 2016-07-18 ENCOUNTER — Other Ambulatory Visit: Payer: Self-pay

## 2016-07-19 DIAGNOSIS — G629 Polyneuropathy, unspecified: Secondary | ICD-10-CM | POA: Diagnosis not present

## 2016-07-19 DIAGNOSIS — Z9181 History of falling: Secondary | ICD-10-CM | POA: Diagnosis not present

## 2016-07-19 DIAGNOSIS — R413 Other amnesia: Secondary | ICD-10-CM | POA: Diagnosis not present

## 2016-07-19 DIAGNOSIS — G20 Parkinson's disease: Secondary | ICD-10-CM | POA: Diagnosis not present

## 2016-07-19 DIAGNOSIS — Z87440 Personal history of urinary (tract) infections: Secondary | ICD-10-CM | POA: Diagnosis not present

## 2016-07-19 DIAGNOSIS — I10 Essential (primary) hypertension: Secondary | ICD-10-CM | POA: Diagnosis not present

## 2016-07-23 DIAGNOSIS — I10 Essential (primary) hypertension: Secondary | ICD-10-CM | POA: Diagnosis not present

## 2016-07-23 DIAGNOSIS — G20 Parkinson's disease: Secondary | ICD-10-CM | POA: Diagnosis not present

## 2016-07-23 DIAGNOSIS — Z9181 History of falling: Secondary | ICD-10-CM | POA: Diagnosis not present

## 2016-07-23 DIAGNOSIS — R413 Other amnesia: Secondary | ICD-10-CM | POA: Diagnosis not present

## 2016-07-23 DIAGNOSIS — G629 Polyneuropathy, unspecified: Secondary | ICD-10-CM | POA: Diagnosis not present

## 2016-07-23 DIAGNOSIS — Z87440 Personal history of urinary (tract) infections: Secondary | ICD-10-CM | POA: Diagnosis not present

## 2016-07-24 DIAGNOSIS — Z87440 Personal history of urinary (tract) infections: Secondary | ICD-10-CM | POA: Diagnosis not present

## 2016-07-24 DIAGNOSIS — Z9181 History of falling: Secondary | ICD-10-CM | POA: Diagnosis not present

## 2016-07-24 DIAGNOSIS — G629 Polyneuropathy, unspecified: Secondary | ICD-10-CM | POA: Diagnosis not present

## 2016-07-24 DIAGNOSIS — G20 Parkinson's disease: Secondary | ICD-10-CM | POA: Diagnosis not present

## 2016-07-24 DIAGNOSIS — R413 Other amnesia: Secondary | ICD-10-CM | POA: Diagnosis not present

## 2016-07-24 DIAGNOSIS — I10 Essential (primary) hypertension: Secondary | ICD-10-CM | POA: Diagnosis not present

## 2016-07-26 DIAGNOSIS — G629 Polyneuropathy, unspecified: Secondary | ICD-10-CM | POA: Diagnosis not present

## 2016-07-26 DIAGNOSIS — Z87440 Personal history of urinary (tract) infections: Secondary | ICD-10-CM | POA: Diagnosis not present

## 2016-07-26 DIAGNOSIS — G20 Parkinson's disease: Secondary | ICD-10-CM | POA: Diagnosis not present

## 2016-07-26 DIAGNOSIS — Z9181 History of falling: Secondary | ICD-10-CM | POA: Diagnosis not present

## 2016-07-26 DIAGNOSIS — I10 Essential (primary) hypertension: Secondary | ICD-10-CM | POA: Diagnosis not present

## 2016-07-26 DIAGNOSIS — R413 Other amnesia: Secondary | ICD-10-CM | POA: Diagnosis not present

## 2016-07-30 DIAGNOSIS — G20 Parkinson's disease: Secondary | ICD-10-CM | POA: Diagnosis not present

## 2016-07-30 DIAGNOSIS — Z9181 History of falling: Secondary | ICD-10-CM | POA: Diagnosis not present

## 2016-07-30 DIAGNOSIS — G629 Polyneuropathy, unspecified: Secondary | ICD-10-CM | POA: Diagnosis not present

## 2016-07-30 DIAGNOSIS — R413 Other amnesia: Secondary | ICD-10-CM | POA: Diagnosis not present

## 2016-07-30 DIAGNOSIS — I10 Essential (primary) hypertension: Secondary | ICD-10-CM | POA: Diagnosis not present

## 2016-07-30 DIAGNOSIS — Z87440 Personal history of urinary (tract) infections: Secondary | ICD-10-CM | POA: Diagnosis not present

## 2016-07-31 DIAGNOSIS — I10 Essential (primary) hypertension: Secondary | ICD-10-CM | POA: Diagnosis not present

## 2016-07-31 DIAGNOSIS — Z87440 Personal history of urinary (tract) infections: Secondary | ICD-10-CM | POA: Diagnosis not present

## 2016-07-31 DIAGNOSIS — G629 Polyneuropathy, unspecified: Secondary | ICD-10-CM | POA: Diagnosis not present

## 2016-07-31 DIAGNOSIS — Z9181 History of falling: Secondary | ICD-10-CM | POA: Diagnosis not present

## 2016-07-31 DIAGNOSIS — G20 Parkinson's disease: Secondary | ICD-10-CM | POA: Diagnosis not present

## 2016-07-31 DIAGNOSIS — R413 Other amnesia: Secondary | ICD-10-CM | POA: Diagnosis not present

## 2016-08-02 DIAGNOSIS — I10 Essential (primary) hypertension: Secondary | ICD-10-CM | POA: Diagnosis not present

## 2016-08-02 DIAGNOSIS — Z9181 History of falling: Secondary | ICD-10-CM | POA: Diagnosis not present

## 2016-08-02 DIAGNOSIS — G629 Polyneuropathy, unspecified: Secondary | ICD-10-CM | POA: Diagnosis not present

## 2016-08-02 DIAGNOSIS — Z87440 Personal history of urinary (tract) infections: Secondary | ICD-10-CM | POA: Diagnosis not present

## 2016-08-02 DIAGNOSIS — G20 Parkinson's disease: Secondary | ICD-10-CM | POA: Diagnosis not present

## 2016-08-02 DIAGNOSIS — R413 Other amnesia: Secondary | ICD-10-CM | POA: Diagnosis not present

## 2016-08-05 DIAGNOSIS — Z9181 History of falling: Secondary | ICD-10-CM | POA: Diagnosis not present

## 2016-08-05 DIAGNOSIS — Z87440 Personal history of urinary (tract) infections: Secondary | ICD-10-CM | POA: Diagnosis not present

## 2016-08-05 DIAGNOSIS — I10 Essential (primary) hypertension: Secondary | ICD-10-CM | POA: Diagnosis not present

## 2016-08-05 DIAGNOSIS — G629 Polyneuropathy, unspecified: Secondary | ICD-10-CM | POA: Diagnosis not present

## 2016-08-05 DIAGNOSIS — R413 Other amnesia: Secondary | ICD-10-CM | POA: Diagnosis not present

## 2016-08-05 DIAGNOSIS — G20 Parkinson's disease: Secondary | ICD-10-CM | POA: Diagnosis not present

## 2016-08-07 DIAGNOSIS — Z87440 Personal history of urinary (tract) infections: Secondary | ICD-10-CM | POA: Diagnosis not present

## 2016-08-07 DIAGNOSIS — Z9181 History of falling: Secondary | ICD-10-CM | POA: Diagnosis not present

## 2016-08-07 DIAGNOSIS — G629 Polyneuropathy, unspecified: Secondary | ICD-10-CM | POA: Diagnosis not present

## 2016-08-07 DIAGNOSIS — R413 Other amnesia: Secondary | ICD-10-CM | POA: Diagnosis not present

## 2016-08-07 DIAGNOSIS — I10 Essential (primary) hypertension: Secondary | ICD-10-CM | POA: Diagnosis not present

## 2016-08-07 DIAGNOSIS — G20 Parkinson's disease: Secondary | ICD-10-CM | POA: Diagnosis not present

## 2016-08-13 DIAGNOSIS — I10 Essential (primary) hypertension: Secondary | ICD-10-CM | POA: Diagnosis not present

## 2016-08-13 DIAGNOSIS — G629 Polyneuropathy, unspecified: Secondary | ICD-10-CM | POA: Diagnosis not present

## 2016-08-13 DIAGNOSIS — Z9181 History of falling: Secondary | ICD-10-CM | POA: Diagnosis not present

## 2016-08-13 DIAGNOSIS — R413 Other amnesia: Secondary | ICD-10-CM | POA: Diagnosis not present

## 2016-08-13 DIAGNOSIS — Z87440 Personal history of urinary (tract) infections: Secondary | ICD-10-CM | POA: Diagnosis not present

## 2016-08-13 DIAGNOSIS — G20 Parkinson's disease: Secondary | ICD-10-CM | POA: Diagnosis not present

## 2016-08-16 DIAGNOSIS — G20 Parkinson's disease: Secondary | ICD-10-CM | POA: Diagnosis not present

## 2016-08-16 DIAGNOSIS — Z87440 Personal history of urinary (tract) infections: Secondary | ICD-10-CM | POA: Diagnosis not present

## 2016-08-16 DIAGNOSIS — G629 Polyneuropathy, unspecified: Secondary | ICD-10-CM | POA: Diagnosis not present

## 2016-08-16 DIAGNOSIS — I10 Essential (primary) hypertension: Secondary | ICD-10-CM | POA: Diagnosis not present

## 2016-08-16 DIAGNOSIS — Z9181 History of falling: Secondary | ICD-10-CM | POA: Diagnosis not present

## 2016-08-16 DIAGNOSIS — R413 Other amnesia: Secondary | ICD-10-CM | POA: Diagnosis not present

## 2016-08-20 DIAGNOSIS — G20 Parkinson's disease: Secondary | ICD-10-CM | POA: Diagnosis not present

## 2016-08-20 DIAGNOSIS — G629 Polyneuropathy, unspecified: Secondary | ICD-10-CM | POA: Diagnosis not present

## 2016-08-20 DIAGNOSIS — R413 Other amnesia: Secondary | ICD-10-CM | POA: Diagnosis not present

## 2016-08-20 DIAGNOSIS — Z9181 History of falling: Secondary | ICD-10-CM | POA: Diagnosis not present

## 2016-08-20 DIAGNOSIS — Z87440 Personal history of urinary (tract) infections: Secondary | ICD-10-CM | POA: Diagnosis not present

## 2016-08-20 DIAGNOSIS — I10 Essential (primary) hypertension: Secondary | ICD-10-CM | POA: Diagnosis not present

## 2016-08-22 DIAGNOSIS — Z9181 History of falling: Secondary | ICD-10-CM | POA: Diagnosis not present

## 2016-08-22 DIAGNOSIS — G20 Parkinson's disease: Secondary | ICD-10-CM | POA: Diagnosis not present

## 2016-08-22 DIAGNOSIS — Z87440 Personal history of urinary (tract) infections: Secondary | ICD-10-CM | POA: Diagnosis not present

## 2016-08-22 DIAGNOSIS — G629 Polyneuropathy, unspecified: Secondary | ICD-10-CM | POA: Diagnosis not present

## 2016-08-22 DIAGNOSIS — I10 Essential (primary) hypertension: Secondary | ICD-10-CM | POA: Diagnosis not present

## 2016-08-22 DIAGNOSIS — R413 Other amnesia: Secondary | ICD-10-CM | POA: Diagnosis not present

## 2016-08-26 DIAGNOSIS — I10 Essential (primary) hypertension: Secondary | ICD-10-CM | POA: Diagnosis not present

## 2016-08-26 DIAGNOSIS — Z9181 History of falling: Secondary | ICD-10-CM | POA: Diagnosis not present

## 2016-08-26 DIAGNOSIS — G629 Polyneuropathy, unspecified: Secondary | ICD-10-CM | POA: Diagnosis not present

## 2016-08-26 DIAGNOSIS — G20 Parkinson's disease: Secondary | ICD-10-CM | POA: Diagnosis not present

## 2016-08-26 DIAGNOSIS — Z87440 Personal history of urinary (tract) infections: Secondary | ICD-10-CM | POA: Diagnosis not present

## 2016-08-26 DIAGNOSIS — R413 Other amnesia: Secondary | ICD-10-CM | POA: Diagnosis not present

## 2016-08-30 ENCOUNTER — Telehealth: Payer: Self-pay

## 2016-08-30 DIAGNOSIS — R413 Other amnesia: Secondary | ICD-10-CM | POA: Diagnosis not present

## 2016-08-30 DIAGNOSIS — Z9181 History of falling: Secondary | ICD-10-CM | POA: Diagnosis not present

## 2016-08-30 DIAGNOSIS — Z87440 Personal history of urinary (tract) infections: Secondary | ICD-10-CM | POA: Diagnosis not present

## 2016-08-30 DIAGNOSIS — G629 Polyneuropathy, unspecified: Secondary | ICD-10-CM | POA: Diagnosis not present

## 2016-08-30 DIAGNOSIS — I10 Essential (primary) hypertension: Secondary | ICD-10-CM | POA: Diagnosis not present

## 2016-08-30 DIAGNOSIS — G20 Parkinson's disease: Secondary | ICD-10-CM | POA: Diagnosis not present

## 2016-08-30 NOTE — Telephone Encounter (Signed)
Spoke with Carla Drape of [facility]. Advised her that there was some extreme difficulty sending a signed request back to them from Dr. Nani Ravens. She advised that the [phone] was the "Main" Fax number, but there was another Fax machine that is very seldom used and instructed me to send the signed order to the following Fax number: [phone]. Confirmation was received.

## 2016-09-02 DIAGNOSIS — G20 Parkinson's disease: Secondary | ICD-10-CM | POA: Diagnosis not present

## 2016-09-02 DIAGNOSIS — Z9181 History of falling: Secondary | ICD-10-CM | POA: Diagnosis not present

## 2016-09-02 DIAGNOSIS — G629 Polyneuropathy, unspecified: Secondary | ICD-10-CM | POA: Diagnosis not present

## 2016-09-02 DIAGNOSIS — Z87440 Personal history of urinary (tract) infections: Secondary | ICD-10-CM | POA: Diagnosis not present

## 2016-09-02 DIAGNOSIS — I10 Essential (primary) hypertension: Secondary | ICD-10-CM | POA: Diagnosis not present

## 2016-09-02 DIAGNOSIS — R413 Other amnesia: Secondary | ICD-10-CM | POA: Diagnosis not present

## 2016-09-06 DIAGNOSIS — I10 Essential (primary) hypertension: Secondary | ICD-10-CM | POA: Diagnosis not present

## 2016-09-06 DIAGNOSIS — Z87440 Personal history of urinary (tract) infections: Secondary | ICD-10-CM | POA: Diagnosis not present

## 2016-09-06 DIAGNOSIS — Z9181 History of falling: Secondary | ICD-10-CM | POA: Diagnosis not present

## 2016-09-06 DIAGNOSIS — G20 Parkinson's disease: Secondary | ICD-10-CM | POA: Diagnosis not present

## 2016-09-06 DIAGNOSIS — R413 Other amnesia: Secondary | ICD-10-CM | POA: Diagnosis not present

## 2016-09-06 DIAGNOSIS — G629 Polyneuropathy, unspecified: Secondary | ICD-10-CM | POA: Diagnosis not present

## 2016-09-09 ENCOUNTER — Telehealth: Payer: Self-pay | Admitting: Family Medicine

## 2016-09-09 DIAGNOSIS — G20 Parkinson's disease: Secondary | ICD-10-CM | POA: Diagnosis not present

## 2016-09-09 DIAGNOSIS — Z87440 Personal history of urinary (tract) infections: Secondary | ICD-10-CM | POA: Diagnosis not present

## 2016-09-09 DIAGNOSIS — R413 Other amnesia: Secondary | ICD-10-CM | POA: Diagnosis not present

## 2016-09-09 DIAGNOSIS — G629 Polyneuropathy, unspecified: Secondary | ICD-10-CM | POA: Diagnosis not present

## 2016-09-09 DIAGNOSIS — Z9181 History of falling: Secondary | ICD-10-CM | POA: Diagnosis not present

## 2016-09-09 DIAGNOSIS — I10 Essential (primary) hypertension: Secondary | ICD-10-CM | POA: Diagnosis not present

## 2016-09-09 NOTE — Telephone Encounter (Signed)
Please advise.//AB/CMA 

## 2016-09-09 NOTE — Telephone Encounter (Signed)
OK 

## 2016-09-09 NOTE — Telephone Encounter (Signed)
Caller name: Sharyn Lull  Relation to pt: PT from [facility]  Call back number: .[phone]    Reason for call:  Requesting verbal orders for 2x a 1 week for 1 month regarding balance mobility.

## 2016-09-09 NOTE — Telephone Encounter (Signed)
Called and spoke with Sharyn Lull with [facility] and give her the okay for verbal orders below per Dr. Nani Ravens for the PT.//AB/CMA

## 2016-09-13 DIAGNOSIS — I10 Essential (primary) hypertension: Secondary | ICD-10-CM | POA: Diagnosis not present

## 2016-09-13 DIAGNOSIS — R413 Other amnesia: Secondary | ICD-10-CM | POA: Diagnosis not present

## 2016-09-13 DIAGNOSIS — G629 Polyneuropathy, unspecified: Secondary | ICD-10-CM | POA: Diagnosis not present

## 2016-09-13 DIAGNOSIS — Z87440 Personal history of urinary (tract) infections: Secondary | ICD-10-CM | POA: Diagnosis not present

## 2016-09-13 DIAGNOSIS — Z9181 History of falling: Secondary | ICD-10-CM | POA: Diagnosis not present

## 2016-09-13 DIAGNOSIS — G20 Parkinson's disease: Secondary | ICD-10-CM | POA: Diagnosis not present

## 2016-09-14 DIAGNOSIS — I10 Essential (primary) hypertension: Secondary | ICD-10-CM | POA: Diagnosis not present

## 2016-09-14 DIAGNOSIS — R413 Other amnesia: Secondary | ICD-10-CM | POA: Diagnosis not present

## 2016-09-14 DIAGNOSIS — Z9181 History of falling: Secondary | ICD-10-CM | POA: Diagnosis not present

## 2016-09-14 DIAGNOSIS — G20 Parkinson's disease: Secondary | ICD-10-CM | POA: Diagnosis not present

## 2016-09-14 DIAGNOSIS — G629 Polyneuropathy, unspecified: Secondary | ICD-10-CM | POA: Diagnosis not present

## 2016-09-14 DIAGNOSIS — Z87440 Personal history of urinary (tract) infections: Secondary | ICD-10-CM | POA: Diagnosis not present

## 2016-09-16 ENCOUNTER — Telehealth: Payer: Self-pay | Admitting: Family Medicine

## 2016-09-16 NOTE — Telephone Encounter (Signed)
This was already done and faxed I believe. TY.

## 2016-09-16 NOTE — Telephone Encounter (Signed)
Caller name: Sophiamarie Nease Relationship to patient: daughter Can be reached: [phone]  Reason for call: Pt is in [facility] living and they only give her magnesium citrate prn. Daughter states she faxed request to [phone] yesterday, Sunday 09/15/16, to Dr. Nani Ravens requesting that 1/4 bottle every 3 days as standing order not as prn. Pt went 10 days without a bowel movement. Also requesting that doctor requesting standing order to document all bowel movements. Asst living requires an order in order for them to document this. Pt also has trouble sleeping at night. They are supposed to give 1 tylenol at night at for sleep prn and need an order.  Daughter states this is urgent. Please call to let her know this is sent.  Send to [facility]: Kassie Mends Fax: [phone]

## 2016-09-17 ENCOUNTER — Telehealth: Payer: Self-pay | Admitting: Behavioral Health

## 2016-09-17 DIAGNOSIS — Z87440 Personal history of urinary (tract) infections: Secondary | ICD-10-CM | POA: Diagnosis not present

## 2016-09-17 DIAGNOSIS — G20 Parkinson's disease: Secondary | ICD-10-CM | POA: Diagnosis not present

## 2016-09-17 DIAGNOSIS — R413 Other amnesia: Secondary | ICD-10-CM | POA: Diagnosis not present

## 2016-09-17 DIAGNOSIS — Z9181 History of falling: Secondary | ICD-10-CM | POA: Diagnosis not present

## 2016-09-17 DIAGNOSIS — I10 Essential (primary) hypertension: Secondary | ICD-10-CM | POA: Diagnosis not present

## 2016-09-17 DIAGNOSIS — G629 Polyneuropathy, unspecified: Secondary | ICD-10-CM | POA: Diagnosis not present

## 2016-09-17 NOTE — Telephone Encounter (Signed)
Caller: Bonita at [facility] [phone]  Reason for call: Medication concern   Per Doroteo Bradford, Rx for Tylenol was received on yesterday, 09/16/16 via fax, but it did not state the strength/milligrams that the patient should be given PRN. She's requesting a new Rx and would like for it to be faxed to [phone], ATTN: Bonita.  Message routed to PCP for review.

## 2016-09-18 NOTE — Telephone Encounter (Signed)
Rx written with correction and faxed to [facility].  Confirmation received.//AB/CMA

## 2016-09-18 NOTE — Telephone Encounter (Signed)
500-650 mg nightly prn. TY.

## 2016-09-20 ENCOUNTER — Telehealth: Payer: Self-pay | Admitting: Family Medicine

## 2016-09-20 DIAGNOSIS — G629 Polyneuropathy, unspecified: Secondary | ICD-10-CM | POA: Diagnosis not present

## 2016-09-20 DIAGNOSIS — Z9181 History of falling: Secondary | ICD-10-CM | POA: Diagnosis not present

## 2016-09-20 DIAGNOSIS — G20 Parkinson's disease: Secondary | ICD-10-CM | POA: Diagnosis not present

## 2016-09-20 DIAGNOSIS — I10 Essential (primary) hypertension: Secondary | ICD-10-CM | POA: Diagnosis not present

## 2016-09-20 DIAGNOSIS — R413 Other amnesia: Secondary | ICD-10-CM | POA: Diagnosis not present

## 2016-09-20 DIAGNOSIS — Z87440 Personal history of urinary (tract) infections: Secondary | ICD-10-CM | POA: Diagnosis not present

## 2016-09-20 NOTE — Telephone Encounter (Signed)
Caller name:Michelle Relationship to patient: [facility] Can be reached: [phone] Pharmacy:  Reason for call: Request verbal orders for in home PT 2 times a week for the next 2 months to work on balancing, safety and gait training

## 2016-09-20 NOTE — Telephone Encounter (Signed)
OK 

## 2016-09-20 NOTE — Telephone Encounter (Signed)
Please advise.//AB/CMA 

## 2016-09-23 NOTE — Telephone Encounter (Signed)
Called and spoke with Sharyn Lull with [facility] regarding the verbal orders for PT.  She stated that she already had the orders for the PT on this pt.  She stated the call was an mistake.//AB/CMA

## 2016-09-24 DIAGNOSIS — G629 Polyneuropathy, unspecified: Secondary | ICD-10-CM | POA: Diagnosis not present

## 2016-09-24 DIAGNOSIS — Z9181 History of falling: Secondary | ICD-10-CM | POA: Diagnosis not present

## 2016-09-24 DIAGNOSIS — I10 Essential (primary) hypertension: Secondary | ICD-10-CM | POA: Diagnosis not present

## 2016-09-24 DIAGNOSIS — R413 Other amnesia: Secondary | ICD-10-CM | POA: Diagnosis not present

## 2016-09-24 DIAGNOSIS — Z87440 Personal history of urinary (tract) infections: Secondary | ICD-10-CM | POA: Diagnosis not present

## 2016-09-24 DIAGNOSIS — G20 Parkinson's disease: Secondary | ICD-10-CM | POA: Diagnosis not present

## 2016-09-27 DIAGNOSIS — Z9181 History of falling: Secondary | ICD-10-CM | POA: Diagnosis not present

## 2016-09-27 DIAGNOSIS — I10 Essential (primary) hypertension: Secondary | ICD-10-CM | POA: Diagnosis not present

## 2016-09-27 DIAGNOSIS — G20 Parkinson's disease: Secondary | ICD-10-CM | POA: Diagnosis not present

## 2016-09-27 DIAGNOSIS — R413 Other amnesia: Secondary | ICD-10-CM | POA: Diagnosis not present

## 2016-09-27 DIAGNOSIS — G629 Polyneuropathy, unspecified: Secondary | ICD-10-CM | POA: Diagnosis not present

## 2016-09-27 DIAGNOSIS — Z87440 Personal history of urinary (tract) infections: Secondary | ICD-10-CM | POA: Diagnosis not present

## 2016-09-30 ENCOUNTER — Telehealth: Payer: Self-pay | Admitting: *Deleted

## 2016-09-30 ENCOUNTER — Telehealth: Payer: Self-pay | Admitting: Family Medicine

## 2016-09-30 NOTE — Telephone Encounter (Signed)
Called by patient's daughter via after hours service. Pt's daughter very upset that her living facility had allowed her mother to go 8-9 days without a BM. The orders that our office has provided were reportedly not followed according to pt's daughter. She is requesting that I verbally DC Miralax and order Mg Citrate daily prn as this is what was supposed to be ordered originally. I called the assisted living facility and gave the verbal order. Forms to be signed this business week.

## 2016-09-30 NOTE — Telephone Encounter (Signed)
Received Physician Orders to be signed and faxed back; forwarded to provider/SLS 05/07

## 2016-10-01 DIAGNOSIS — G629 Polyneuropathy, unspecified: Secondary | ICD-10-CM | POA: Diagnosis not present

## 2016-10-01 DIAGNOSIS — R413 Other amnesia: Secondary | ICD-10-CM | POA: Diagnosis not present

## 2016-10-01 DIAGNOSIS — I10 Essential (primary) hypertension: Secondary | ICD-10-CM | POA: Diagnosis not present

## 2016-10-01 DIAGNOSIS — Z87440 Personal history of urinary (tract) infections: Secondary | ICD-10-CM | POA: Diagnosis not present

## 2016-10-01 DIAGNOSIS — Z9181 History of falling: Secondary | ICD-10-CM | POA: Diagnosis not present

## 2016-10-01 DIAGNOSIS — G20 Parkinson's disease: Secondary | ICD-10-CM | POA: Diagnosis not present

## 2016-10-03 ENCOUNTER — Telehealth: Payer: Self-pay | Admitting: Family Medicine

## 2016-10-03 NOTE — Telephone Encounter (Signed)
Rx written and faxed to [facility] in [place] @ [phone]).  Confirmation received.//AB/CMA

## 2016-10-03 NOTE — Telephone Encounter (Signed)
Caller name:Tonya  Relation to pt: daughter Call back number: [phone] Pharmacy: [facility] in [place]  Reason for call: Pt's daughter is requesting rx for pt on 1/4-1/2 Bottle Mag Citrate for 3 days, 1/1 bottle mag citrate, PRN and Tylenol 325 mg, po PRN to be sent to the senior home [facility] in [place]. Please fax rx to [facility]: Clayton Lefort  [phone]. Please advise ASAP. Daughter stated needed it to be sent by today 10-03-2016.

## 2016-10-04 ENCOUNTER — Telehealth: Payer: Self-pay | Admitting: *Deleted

## 2016-10-04 DIAGNOSIS — Z9181 History of falling: Secondary | ICD-10-CM | POA: Diagnosis not present

## 2016-10-04 DIAGNOSIS — I10 Essential (primary) hypertension: Secondary | ICD-10-CM | POA: Diagnosis not present

## 2016-10-04 DIAGNOSIS — Z87440 Personal history of urinary (tract) infections: Secondary | ICD-10-CM | POA: Diagnosis not present

## 2016-10-04 DIAGNOSIS — G20 Parkinson's disease: Secondary | ICD-10-CM | POA: Diagnosis not present

## 2016-10-04 DIAGNOSIS — R413 Other amnesia: Secondary | ICD-10-CM | POA: Diagnosis not present

## 2016-10-04 DIAGNOSIS — G629 Polyneuropathy, unspecified: Secondary | ICD-10-CM | POA: Diagnosis not present

## 2016-10-04 NOTE — Telephone Encounter (Signed)
Received Physician Orders clarification from [facility], forwarded to provider/SLS 05/11

## 2016-10-07 ENCOUNTER — Telehealth: Payer: Self-pay | Admitting: Family Medicine

## 2016-10-07 NOTE — Telephone Encounter (Signed)
I would recommend using an enema. Run it by her daughter to make sure she is OK with this plan before going ahead with plan. TY.

## 2016-10-07 NOTE — Telephone Encounter (Signed)
Called and [facility] @ 5:51pm @ [phone]) asking [name] to RTC regarding the message below.//AB/CMA

## 2016-10-07 NOTE — Telephone Encounter (Signed)
Please call [facility] to discuss patient.

## 2016-10-07 NOTE — Telephone Encounter (Signed)
Caller name: [name] Relationship to patient: [facility] Can be reached: [phone]  Pharmacy:  Reason for call: [facility] wants a call back to see if provider has any suggestions as to what they can do because patient still has not had a BM

## 2016-10-09 NOTE — Telephone Encounter (Signed)
Called and spoke with Stanton Kidney at [facility] regarding the message below from [place].  Informed Stanton Kidney that I call on Monday and [facility] for [name] but believe it was the wrong number that I call.  Stanton Kidney stated that it was reported in the pt's chart on Monday that the pt was given another bottle of Citrate.  The pt drank 3/4 of the bottle and it was reported that the pt had a bowel movement on Monday night.//AB/CMA

## 2016-10-15 DIAGNOSIS — H524 Presbyopia: Secondary | ICD-10-CM | POA: Diagnosis not present

## 2016-10-15 DIAGNOSIS — H2513 Age-related nuclear cataract, bilateral: Secondary | ICD-10-CM | POA: Diagnosis not present

## 2016-10-15 DIAGNOSIS — H04123 Dry eye syndrome of bilateral lacrimal glands: Secondary | ICD-10-CM | POA: Diagnosis not present

## 2016-10-16 DIAGNOSIS — I10 Essential (primary) hypertension: Secondary | ICD-10-CM | POA: Diagnosis not present

## 2016-10-16 DIAGNOSIS — Z87440 Personal history of urinary (tract) infections: Secondary | ICD-10-CM | POA: Diagnosis not present

## 2016-10-16 DIAGNOSIS — Z9181 History of falling: Secondary | ICD-10-CM | POA: Diagnosis not present

## 2016-10-16 DIAGNOSIS — R413 Other amnesia: Secondary | ICD-10-CM | POA: Diagnosis not present

## 2016-10-16 DIAGNOSIS — G629 Polyneuropathy, unspecified: Secondary | ICD-10-CM | POA: Diagnosis not present

## 2016-10-16 DIAGNOSIS — G20 Parkinson's disease: Secondary | ICD-10-CM | POA: Diagnosis not present

## 2016-10-22 ENCOUNTER — Telehealth: Payer: Self-pay | Admitting: Family Medicine

## 2016-10-22 NOTE — Telephone Encounter (Signed)
Forms have been placed in Dr. Irene Limbo red folder for review and completion.

## 2016-10-22 NOTE — Telephone Encounter (Signed)
Social worker called from [facility] to see about getting patients FL2 forms completed. Forms have been received per RN and waiting on provider to sign. I did let her know provider is out of office and I'm not sure if he would required an office visit since patient has not been seen since 2017.

## 2016-10-23 ENCOUNTER — Telehealth: Payer: Self-pay | Admitting: Family Medicine

## 2016-10-23 ENCOUNTER — Encounter: Payer: Self-pay | Admitting: *Deleted

## 2016-10-23 NOTE — Telephone Encounter (Signed)
Since Copland is doc of the day, I am routing this to her CMA's for today. This is pt of [facility] but Nani Ravens is on vacation. pt will be transferred to [facility] 10/24/16. intake coordinator Butch Penny Needs order to "continue with assisted living, diet order, TB test, FL2 form" [place] daughter says it must be done by end of today for placement tomorrow. Daughter [name] [phone]. Katy Fitch is intake worker at [employer] email for Butch Penny is [email or web address] Donna's Ph# [phone]. Daughter states Butch Penny has called and faxed 3 times with no response. They know Nani Ravens is not here but pt needs done today.

## 2016-10-23 NOTE — Telephone Encounter (Signed)
Asked by Dr. Lorelei Pont to call family to discuss and complete FL2.  Spoke w/ patient's daughter, [name], and review FL2 form. There are essentially no changes since previous FL2, which is scanned in Media, was filled out.  Spoke w/ Education officer, museum, Butch Penny, at [employer] and they also need diet orders, order for admit to assisted living, and orders to place PPD on admission-ok to do this in letter form.

## 2016-10-23 NOTE — Telephone Encounter (Signed)
Thank you Hoyle Sauer!  Your help is much appreciated

## 2016-10-23 NOTE — Telephone Encounter (Signed)
FL2 and accompanying letter faxed to [facility] at [phone]. Fax confirmation received. Spoke w/ social worker, Butch Penny, who states she did not receive fax. Re-faxed to number above as well as [phone] (per Butch Penny), fax confirmations received for both.

## 2016-10-23 NOTE — Telephone Encounter (Signed)
pt tx [facility] 10/24/16. intake coordinator Butch Penny Needs order to continue with assisted living, diet order, TB test, FL2 form. [name] daughter says it must be done by end of today for placement tomorrow. [name] [phone]. Butch Penny has called and faxed 3 times with no response. They know Nani Ravens is not here but pt needs done.

## 2016-10-25 NOTE — Telephone Encounter (Signed)
Misty notified of instructions below and verbalized understanding.

## 2016-10-25 NOTE — Telephone Encounter (Signed)
[phone] Nurse calling from [facility] seeking clarification .

## 2016-10-25 NOTE — Telephone Encounter (Signed)
Returned call to [place].   1) Patient came to facility with losartan 50 mg BID from [facility], but this med is not on FL2 or med list. Should patient continue to take? BP readings for yesterday and today are 143/82 and 120/68 (without losartan).  2) Patient has both Miralax and mag citrate on med list from previous facility, but patient states she does not take Miralax. Referenced 09/30/16 phone note and dc'd Miralax. Misty needs to know dose and frequency for mag citrate. Patient states she drinks 'as much as she needs'. Okay for 0-1 bottles PO daily PRN?   3) Needs verification of code status. Informed Misty that we have a DNR from 2016 scanned into system, but recommended she verify code status and request hard copy of DNR from patient's daughter.    4) Needs to know frequency and where to apply [place]. I asked her to verify with pt's daughter how they have been using this and to continue using as directed.

## 2016-10-25 NOTE — Telephone Encounter (Signed)
Discussed w/ Dr. Nani Ravens:  DC losartan. If patient/family has further concerns regarding BP, recommend follow-up appt.  Okay for mag citrate 0-1 bottles daily PRN.  Recommend verifying code status w/ family.  Okay to use Mederma per pt/family preference.  Returned call to [place], no answer. Will attempt call back later.

## 2016-10-28 NOTE — Telephone Encounter (Signed)
The FL2 form completed,signed and faxed.//AB/CMA

## 2016-10-29 ENCOUNTER — Telehealth: Payer: Self-pay | Admitting: *Deleted

## 2016-10-29 NOTE — Telephone Encounter (Signed)
Received Case Communication from [facility], forwarded to provider/SLS 06/05

## 2016-10-31 ENCOUNTER — Ambulatory Visit: Payer: BLUE CROSS/BLUE SHIELD | Admitting: Family Medicine

## 2016-11-01 ENCOUNTER — Encounter: Payer: Self-pay | Admitting: Family Medicine

## 2016-11-01 ENCOUNTER — Ambulatory Visit (INDEPENDENT_AMBULATORY_CARE_PROVIDER_SITE_OTHER): Payer: Medicare Other | Admitting: Family Medicine

## 2016-11-01 VITALS — BP 164/90 | HR 56 | Temp 97.9°F | Ht 60.0 in | Wt 126.4 lb

## 2016-11-01 DIAGNOSIS — E039 Hypothyroidism, unspecified: Secondary | ICD-10-CM

## 2016-11-01 DIAGNOSIS — I10 Essential (primary) hypertension: Secondary | ICD-10-CM | POA: Diagnosis not present

## 2016-11-01 NOTE — Progress Notes (Signed)
Chief Complaint  Patient presents with  . Follow-up    6 mos     Subjective: Patient is a 83 y.o. female here for 6 mo med check. She is here with her daughter, [name].  Pt is here for 6 mo check. She has recently moved to [facility] and needs her standing orders signed. She is able to toilet by herself and does not have any issues with incontinence. She states that she is not in any pain and has no complaints. There is no swelling in her lower extremities. She is tolerating her medicine well. This is reinforced by her daughter.  ROS: Heart: Denies chest pain  Lungs: Denies SOB   Family History  Problem Relation Age of Onset  . Dementia Mother        Alzheimers  . Stroke Mother        ? 16  . Cancer Son        multiple myeloma  . Pancreatic cancer Son   . Heart disease Brother   . Lung cancer Maternal Uncle        smoker  . Heart attack Neg Hx   . Diabetes Neg Hx   . Hearing loss Neg Hx    Past Medical History:  Diagnosis Date  . Aortic stenosis, mild   . Gilbert syndrome   . HTN (hypertension)   . Hyperlipidemia   . Hypothyroidism   . Melanoma ([place])   . Neuropathy   . Osteoporosis   . Parkinson's disease ([place])   . Peripheral neuropathy    Bilateral  . Rheumatic fever    does not take Flu shot    Allergies  Allergen Reactions  . Sulfonamide Derivatives     REACTION:generalized  swelling  . Sulfa Antibiotics Swelling    Current Outpatient Prescriptions:  .  AMBULATORY NON FORMULARY MEDICATION, Upright Walker, Disp: 1 Device, Rfl: 0 .  Calcium Carbonate-Vitamin D (CALCIUM 600/VITAMIN D PO), Take 1 tablet by mouth daily. , Disp: , Rfl:  .  carbidopa-levodopa (SINEMET IR) 25-100 MG tablet, Take 1 tablet by mouth 3 (three) times daily. , Disp: , Rfl: 0 .  Cholecalciferol (VITAMIN D) 2000 UNITS CAPS, Take 1 capsule (2,000 Units total) by mouth daily., Disp: 30 capsule, Rfl: 3 .  levothyroxine (SYNTHROID, LEVOTHROID) 50 MCG tablet, Take one on Sunday, Monday,  Wednesday, and Friday, Disp: , Rfl:  .  levothyroxine (SYNTHROID, LEVOTHROID) 75 MCG tablet, Take one on Tuesday, Thursday, and Saturday., Disp: , Rfl: 0 .  magnesium citrate SOLN, Take 1 Bottle by mouth daily as needed. , Disp: 195 mL, Rfl:  .  Multiple Vitamin (MULTIVITAMIN) capsule, Take 1 capsule by mouth daily.  , Disp: , Rfl:  .  omeprazole (PRILOSEC) 20 MG capsule, Take 20 mg by mouth daily. , Disp: , Rfl: 1 .  Scar Treatment Products ([place] SPF 30 EX), Apply topically., Disp: , Rfl:   Objective: BP (!) 164/90 (BP Location: Left Arm, Patient Position: Sitting, Cuff Size: Normal)   Pulse (!) 56   Temp 97.9 F (36.6 C) (Oral)   Ht 5' (1.524 m)   Wt 126 lb 6.4 oz (57.3 kg)   SpO2 97%   BMI 24.69 kg/m  General: Awake, appears stated age HEENT: MMM Neck: Supple, no thyromegaly, no masses or asymmetry Heart: RRR, SEM heard loudest at aortic listening post w radiation to carotids, no LE edema Lungs: CTAB, no rales, wheezes or rhonchi. No accessory muscle use Psych: Normal affect and mood  Assessment and Plan: Hypothyroidism, unspecified type  Essential hypertension  Orders as above. No changes for now. Pt's daughter very happy with how things are going at [facility].  Will fill out orders and send/copy. F/u in 6 mo, will recheck labs at that time. The patient voiced understanding and agreement to the plan.  [place], DO 11/01/16  11:35 AM

## 2016-11-01 NOTE — Patient Instructions (Addendum)
I will have forms filled out by end of today.  Let us know if you need anything.

## 2016-11-06 ENCOUNTER — Telehealth: Payer: Self-pay | Admitting: *Deleted

## 2016-11-06 NOTE — Telephone Encounter (Signed)
Received Physician Orders documents that are dated 10/29/16 from [facility] with sign here notes attached to forms;documents placed on my desk sometime Monday afternoon, 11/04/16, after I had left the office without any note placed in Epic; received on Tues, 11/05/16 Am; forwarded to provider/SLS 06/13

## 2016-11-07 ENCOUNTER — Telehealth: Payer: Self-pay | Admitting: *Deleted

## 2016-11-07 NOTE — Telephone Encounter (Signed)
Received Physician Orders from [facility], forwarded to provider/SLS 06/14

## 2016-11-08 ENCOUNTER — Telehealth: Payer: Self-pay | Admitting: Family Medicine

## 2016-11-08 NOTE — Telephone Encounter (Signed)
Called and [facility] @ 2:46pm @ [phone]) asking Vicente Males to RTC regarding message below.//AB/CMA

## 2016-11-08 NOTE — Telephone Encounter (Signed)
OK 

## 2016-11-08 NOTE — Telephone Encounter (Signed)
Caller name: Vicente Males  Relation to pt: PT with [facility] back number: [phone]  Pharmacy:  Reason for call:  Verbal orders for 2x 8 focusing on gait training, balance, safe function, mobility, transfer, and soft tissue cervical/thoracic spine, please advise

## 2016-11-08 NOTE — Telephone Encounter (Signed)
Spoke with Vicente Males with [facility] and gave her verbal orders for the 2x 8 focusing on gait training,balance,safe function,mobility,transfer, and soft tissue cervical/thoracic spine per Dr. [place]ling.//AB/CMA

## 2016-11-11 DIAGNOSIS — G20 Parkinson's disease: Secondary | ICD-10-CM | POA: Diagnosis not present

## 2016-11-11 DIAGNOSIS — Z87440 Personal history of urinary (tract) infections: Secondary | ICD-10-CM | POA: Diagnosis not present

## 2016-11-11 DIAGNOSIS — R413 Other amnesia: Secondary | ICD-10-CM | POA: Diagnosis not present

## 2016-11-11 DIAGNOSIS — I10 Essential (primary) hypertension: Secondary | ICD-10-CM | POA: Diagnosis not present

## 2016-11-11 DIAGNOSIS — G629 Polyneuropathy, unspecified: Secondary | ICD-10-CM | POA: Diagnosis not present

## 2016-11-11 DIAGNOSIS — Z9181 History of falling: Secondary | ICD-10-CM | POA: Diagnosis not present

## 2016-11-13 DIAGNOSIS — G20 Parkinson's disease: Secondary | ICD-10-CM | POA: Diagnosis not present

## 2016-11-13 DIAGNOSIS — Z9181 History of falling: Secondary | ICD-10-CM | POA: Diagnosis not present

## 2016-11-13 DIAGNOSIS — R413 Other amnesia: Secondary | ICD-10-CM | POA: Diagnosis not present

## 2016-11-13 DIAGNOSIS — G629 Polyneuropathy, unspecified: Secondary | ICD-10-CM | POA: Diagnosis not present

## 2016-11-13 DIAGNOSIS — Z87440 Personal history of urinary (tract) infections: Secondary | ICD-10-CM | POA: Diagnosis not present

## 2016-11-13 DIAGNOSIS — I10 Essential (primary) hypertension: Secondary | ICD-10-CM | POA: Diagnosis not present

## 2016-11-13 NOTE — Progress Notes (Signed)
Natalie Lam was seen today in the movement disorders clinic for neurologic consultation at the request of Shelda Pal, DO.  The patient is accompanied by her daughter who supplements the history.  Prior records made available to me were reviewed.  The patient has a history of gait instability.  Pts sx's started in March, 2016.  Pt thinks that it started quickly.  She remembers one particular day in March that she went to [facility] and she backed into the space and she realized that she tried to walk into [employer] and just felt that she would fall if she didn't get back in the car.  She had a near syncopal episode which was later determined due to HTN and mild AS.  After that, she seemed to go slowly down hill.  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  She had PT in May which helped her back but didn't really seem to help the gait.  Pt is getting ready to move to [facility] assisted living.  Pt states that she just has a "fear of falling because so many people my age do."  Admits that depression contributes to sx's but states that doing better than she was.  Pt denies lateralizing weakness/paresthesias.  Denies speech change but states that she "tires easily."  Denies feet paresthesias.      Specific Symptoms:  Tremor: No. Voice: denies hypophonic speech Sleep: sleeps well  Vivid Dreams:  Yes.   (only if eats at night)  Acting out dreams:  No. Wet Pillows: No. Postural symptoms:  Yes.   (no cane/walker to ambulate)  Falls?  No. Bradykinesia symptoms: slow movements Loss of smell:  No. Loss of taste:  No. Urinary Incontinence:  No. Difficulty Swallowing:  No. Handwriting, micrographia: No. (perhaps a little smaller) Trouble with ADL's:  No.  Trouble buttoning clothing: No. Depression:  Yes.   (multiple deaths in family) Memory changes:  Yes.   (but  complicated by deaths, moving, closing family business; still drives in familiar territory and parks far away to avoid other cars; pays monthly bills without issue; cooks for self until moves to [facility]) Hallucinations:  No.  visual distortions: No. N/V:  No. Lightheaded:  No.   Neuroimaging has previously been performed.  It is  available for my review today.  A CT of the brain was performed on 08/12/2014 after near syncopal episode.  I reviewed this.  There is atrophy and rather significant cervical small vessel disease.  A carotid ultrasound was performed on 09/06/2014.  This demonstrated 1-49% stenosis bilaterally.  09/13/15 update:  The patient is following up today, accompanied by her daughter who supplements the history.  I have not seen her since July, 2016 and that was only a one time visit.  She does have a history of peripheral neuropathy, which has led to gait instability.  Her peripheral neuropathy has also caused paresthesias in the feet and according to records she was started on gabapentin by the nurse practitioner at [facility] in October, 2016.  She states that she is f/u due to trouble walking for about 6 weeks.  Her daughter is a Marine scientist and agrees that the majority of the issue started about 6 weeks ago, with no new med changes.  Pt states that she is getting stuck in doorways.  "Once I get going, I am okay."  11/24/15 update:  Pt  is f/u today re: newly dx PD last visit. This patient is accompanied in the office by her child who supplements the history.  Reviewed records since last visit.  On levodopa tid now.  Tolerating it well.  Feels that she is doing much better.  States that she feels stronger in the legs and "I don't have to work to get across the doorway."  No hallucinations.  No lightheaded/near syncope.  No falls since last visit.  Did PT/OT at [facility] and feels it helped.  Uses rollator.    02/26/16 update:  The patient follows up regarding her Parkinson's disease.  She  remains on carbidopa/levodopa 25/100, one tablet 3 times per day.  She reports that she has been doing well.  No falls.  No lightheadedness or near syncope.  She is using her Rollator faithfully but "with my wheels I can travel."  She did go to the emergency room with abdominal pain, which was ultimately felt due to constipation.  She is taking MiraLAX but doesn't use it daily.  She is still having trouble balancing constipation with diarrhea.  Daughter that Baker Pierini was started almost a month ago.  She is on 80mg once per day.  Because of intermittent diarrhea, she has cut back on exercise some.  Lives in assisted living and gets assist with medication, bath, dressing (putting on hose especially).  Meals prepared.  [facility]    06/20/16 update:  Patient follows up today, on carbidopa/levodopa 25/100, 1 tablet 3 times a day.  She is accompanied by her granddaughter who supplements the history.  [facility] moved from [facility] to [facility] since our last visit.  When she moved, her levodopa was decreased from 1-1/2 tablets to 1 tablet 3 times per day.  Her daughter ended up calling me about this, and I left it at that dose because her daughter also stated that she was having hallucinations.  It was unclear if these were actual hallucinations, or if these were arising out of sleep.  She followed up with her primary care physician.  A urinalysis was unremarkable.  Denies any hallucination or visual distortions today.  Daughter states having paranoia.  Not sleeping well because she worries that there are things going on outside her room (pt denies this but daughter relates that).  daughter relates that if door closes she becomes hyperfocused on why it closed.  Pt relates that it is much better now as she has gotten used to the noises of the new facility.  Working with PT.  Daughter concerned that PT goal is to get her off of the walker.  11/19/16 update:  Patient seen today in follow-up.  Patient is  accompanied by her daughter who supplements the history.  She remains on carbidopa/levodopa 25/100, one tablet 3 times per day.  The records that were made available to me were reviewed.  [facility]  Daughter states that she is in enhanced assisted living, costing $11,000-$12,000 per month.  She is getting assist with meds, bathing, dressing, meals but daughter doesn't think that she needs the enhanced part of assisted living.  Her daughter states that the only difference between regular and enhanced assisted living is the CNA level (1:12 vs 1:4-6 patients).  No falls except states that she slid off toilet once when trying to put on panties.       ALLERGIES:   Allergies  Allergen Reactions  . Sulfonamide Derivatives     REACTION:generalized  swelling  . Sulfa Antibiotics Swelling    CURRENT MEDICATIONS:  Outpatient Encounter Prescriptions as of 11/19/2016  Medication Sig  . acetaminophen (TYLENOL) 325 MG tablet Take 650 mg by mouth every 6 (six) hours as needed.  . Calcium Carbonate-Vitamin D (CALCIUM 600/VITAMIN D PO) Take 1 tablet by mouth daily.   . carbidopa-levodopa (SINEMET IR) 25-100 MG tablet Take 1 tablet by mouth 3 (three) times daily.   . Cholecalciferol (VITAMIN D) 2000 UNITS CAPS Take 1 capsule (2,000 Units total) by mouth daily.  Marland Kitchen levothyroxine (SYNTHROID, LEVOTHROID) 50 MCG tablet Take one on Sunday, Monday, Wednesday, and Friday  . levothyroxine (SYNTHROID, LEVOTHROID) 75 MCG tablet Take one on Tuesday, Thursday, and Saturday.  . magnesium citrate SOLN Take 1 Bottle by mouth daily as needed.   . Multiple Vitamin (MULTIVITAMIN) capsule Take 1 capsule by mouth daily.    Marland Kitchen omeprazole (PRILOSEC) 20 MG capsule Take 20 mg by mouth daily.   . polyvinyl alcohol (LIQUIFILM TEARS) 1.4 % ophthalmic solution 1 drop as needed for dry eyes.  . [DISCONTINUED] AMBULATORY NON FORMULARY MEDICATION Upright Walker  . [DISCONTINUED] Scar Treatment Products  ([place] SPF 59 EX) Apply topically.   No facility-administered encounter medications on file as of 11/19/2016.     PAST MEDICAL HISTORY:   Past Medical History:  Diagnosis Date  . Aortic stenosis, mild   . Gilbert syndrome   . HTN (hypertension)   . Hyperlipidemia   . Hypothyroidism   . Melanoma ([place])   . Neuropathy   . Osteoporosis   . Parkinson's disease ([place])   . Peripheral neuropathy    Bilateral  . Rheumatic fever    does not take Flu shot     PAST SURGICAL HISTORY:   Past Surgical History:  Procedure Laterality Date  . ABDOMINAL HYSTERECTOMY  1963   no BSO; no abnormal PAPs  . COLONOSCOPY  2000   negative; [place]  . [place]  . MELANOMA EXCISION  08/2014    SOCIAL HISTORY:   Social History   Social History  . Marital status: Widowed    Spouse name: N/A  . Number of children: N/A  . Years of education: N/A   Occupational History  . retired own [employer] Topics  . Smoking status: Never Smoker  . Smokeless tobacco: Never Used  . Alcohol use No  . Drug use: No  . Sexual activity: Not on file   Other Topics Concern  . Not on file   Social History Narrative   Lives at [place] 01/05/2015   Widowed   Never smoked   Exercise walking   Alcohol none   POA/Living Will          FAMILY HISTORY:   Family Status  Relation Status  . Mother Deceased       alzheimers, stroke  . Son Deceased       multiple myeloma  . Son Deceased       pancreatic cancer  . Father Deceased       unknown  . Brother Alive  . Brother Deceased  . Sister Alive  . Sister Alive  . Daughter Alive  . Daughter Deceased       decapitated in [place]  . Mat Uncle (Not Specified)  . Neg Hx (Not Specified)    ROS:   A complete 10 system review of systems was obtained and was unremarkable apart from what is mentioned above.  PHYSICAL EXAMINATION:    VITALS:   Vitals:   11/19/16 1104  BP: 132/66  Pulse: 64    SpO2: 97%  Weight: 128 lb (58.1 kg)  Height: 5' (1.524 m)    GEN:  The patient appears stated age and is in NAD. HEENT:  Normocephalic, atraumatic.  The mucous membranes are moist. The superficial temporal arteries are without ropiness or tenderness. CV:  RRR with 3/6 SEM Lungs:  CTAB Neck/HEME:  There are no carotid bruits bilaterally.  Neurological examination:  Orientation: Had trouble with trail making.  Did well with orientation. Montreal Cognitive Assessment  11/19/2016 12/13/2014  Visuospatial/ Executive (0/5) 1 3  Naming (0/3) 2 2  Attention: Read list of digits (0/2) 2 2  Attention: Read list of letters (0/1) 1 1  Attention: Serial 7 subtraction starting at 100 (0/3) 0 1  Language: Repeat phrase (0/2) 2 2  Language : Fluency (0/1) 0 1  Abstraction (0/2) 2 2  Delayed Recall (0/5) 0 0  Orientation (0/6) 6 6  Total 16 20  Adjusted Score (based on education) 16 21    Cranial nerves: There is good facial symmetry.  Extraocular muscles are intact. The visual fields are full to confrontational testing. The speech is fluent and clear. Soft palate rises symmetrically and there is no tongue deviation. Hearing is intact to conversational tone. Sensation: Sensation is intact to light touch throughout Motor: Strength is 5/5 in the bilateral upper and lower extremities.   Shoulder shrug is equal and symmetric.  There is no pronator drift.  Movement examination: Tone: There is normal tone bilaterally Abnormal movements: None Coordination:  There is decremation with finger taps, toe taps bilaterally and with heel taps on the L Gait and Station: The patient pushes off of the chair to get off.  Ambulates with rollator.  She has start hesitation.  She does hesitate in the doorway.  She intermittently slows just for a second and momentarily freezes.  She slightly drags the L leg with ambulation.  Labs:  Lab Results  Component Value Date   VITAMINB12 765 08/11/2014   No results found  for: HGBA1C    ASSESSMENT/PLAN:  1.  Parkinson's disease  -dx formally in 08/2015  -We discussed the diagnosis as well as pathophysiology of the disease.  We discussed treatment options as well as prognostic indicators.  Patient education was provided.  -continue carbidopa/levodopa 25/100 tid .  Higher dosage may have caused hallucinations.  -working with PT.  Needs to use walker at all times.  Asked PT to try regular walker instead of rollator as she festinates.  Daughter states that they are now working on this and she is doing better with this.  Daughter asked for RX for that walker today and was provided.  -daughter would like patient back in assisted living vs enhanced assisted living.  No longer with paranoia, hallucinations, bowel issues per daughter.  Will get me a formal description of the differences and will review before making opinion.  Her PCP is not the PCP at [facility].  2.  Melanoma  -discussed with the patient that PD increases risk of melanoma and sees [facility] dermatology.    3.  Constipation  -doing much better and managing with prune juice, mag citrate and applesauce  4.  Follow up is anticipated in the next few months, sooner should new neurologic issues arise.  Much greater than 50% of this visit was spent in counseling and coordinating care.  Total face to face  time:  25 min

## 2016-11-18 DIAGNOSIS — G20 Parkinson's disease: Secondary | ICD-10-CM | POA: Diagnosis not present

## 2016-11-18 DIAGNOSIS — G629 Polyneuropathy, unspecified: Secondary | ICD-10-CM | POA: Diagnosis not present

## 2016-11-18 DIAGNOSIS — R413 Other amnesia: Secondary | ICD-10-CM | POA: Diagnosis not present

## 2016-11-18 DIAGNOSIS — I10 Essential (primary) hypertension: Secondary | ICD-10-CM | POA: Diagnosis not present

## 2016-11-18 DIAGNOSIS — Z9181 History of falling: Secondary | ICD-10-CM | POA: Diagnosis not present

## 2016-11-18 DIAGNOSIS — Z87440 Personal history of urinary (tract) infections: Secondary | ICD-10-CM | POA: Diagnosis not present

## 2016-11-19 ENCOUNTER — Telehealth: Payer: Self-pay | Admitting: Neurology

## 2016-11-19 ENCOUNTER — Encounter: Payer: Self-pay | Admitting: Neurology

## 2016-11-19 ENCOUNTER — Ambulatory Visit (INDEPENDENT_AMBULATORY_CARE_PROVIDER_SITE_OTHER): Payer: Medicare Other | Admitting: Neurology

## 2016-11-19 VITALS — BP 132/66 | HR 64 | Ht 60.0 in | Wt 128.0 lb

## 2016-11-19 DIAGNOSIS — F0280 Dementia in other diseases classified elsewhere without behavioral disturbance: Secondary | ICD-10-CM | POA: Diagnosis not present

## 2016-11-19 DIAGNOSIS — G20 Parkinson's disease: Secondary | ICD-10-CM

## 2016-11-19 NOTE — Telephone Encounter (Signed)
Not sure of misty's direct #. She only left Fax #. Sorry

## 2016-11-19 NOTE — Patient Instructions (Signed)
Get me a list of the difference between enhanced vs regular assisted living  Make a follow up appointment for 6 months

## 2016-11-19 NOTE — Telephone Encounter (Signed)
Faxed note to facility as instructed. The number listed is not the facility number. Did Misty leave a direct contact number to reach her back?

## 2016-11-19 NOTE — Telephone Encounter (Signed)
CallerAdministrator)   Urgent? No  Reason for the call: She would like today's office note faxed to them at [phone] for their records. She also wanted to clarify the order for her walker. He mentioned tennis balls for it. She said she has a Marketing executive? Please Advise. Thanks

## 2016-11-20 ENCOUNTER — Encounter: Payer: Self-pay | Admitting: Neurology

## 2016-11-20 ENCOUNTER — Telehealth: Payer: Self-pay | Admitting: Neurology

## 2016-11-20 DIAGNOSIS — G20 Parkinson's disease: Secondary | ICD-10-CM | POA: Diagnosis not present

## 2016-11-20 DIAGNOSIS — I10 Essential (primary) hypertension: Secondary | ICD-10-CM | POA: Diagnosis not present

## 2016-11-20 DIAGNOSIS — G629 Polyneuropathy, unspecified: Secondary | ICD-10-CM | POA: Diagnosis not present

## 2016-11-20 DIAGNOSIS — Z9181 History of falling: Secondary | ICD-10-CM | POA: Diagnosis not present

## 2016-11-20 DIAGNOSIS — Z87440 Personal history of urinary (tract) infections: Secondary | ICD-10-CM | POA: Diagnosis not present

## 2016-11-20 DIAGNOSIS — R413 Other amnesia: Secondary | ICD-10-CM | POA: Diagnosis not present

## 2016-11-20 NOTE — Telephone Encounter (Signed)
PT's daughter called and wants to know a good therapist that Dr Tat recommends, current therapist is asking her to walk fast and her daughter said with parkinson that is not possible

## 2016-11-20 NOTE — Telephone Encounter (Signed)
Called and spoke with Natalie Lam at [phone]. Made her aware she needs the standard walker with tennis balls- not the rollator she is using. She will call with any other questions.

## 2016-11-21 NOTE — Telephone Encounter (Signed)
Patient's daughter called and said that She did not want [facility] and [facility] wouldn't work because she cannot get her there. Please Advise. Thanks

## 2016-11-21 NOTE — Telephone Encounter (Signed)
The [facility] is excellent but likewise [facility] generally does a great job

## 2016-11-21 NOTE — Telephone Encounter (Signed)
Called and left a message for Natalie Lam letting her know Dr. Carles Collet would recommend the therapists at [facility] and [facility] - if she wants another therapist she can do some research and let us know where she would like [place] to send a referral. Will await call back and send a referral if she asks.

## 2016-12-02 ENCOUNTER — Telehealth: Payer: Self-pay | Admitting: *Deleted

## 2016-12-02 NOTE — Telephone Encounter (Signed)
Received [place]; forwarded to provider/SLS 07/09

## 2016-12-03 ENCOUNTER — Telehealth: Payer: Self-pay | Admitting: Neurology

## 2016-12-03 NOTE — Telephone Encounter (Signed)
Caller: Tonya  Urgent? Yes  Reason for the call: Her daughter called and said that she had an Hypertensive episode yesterday. She also cannot walk. Her daughter said this is an emergency and the last couple days have not been good. She would like to get her mom in to see Dr. Carles Collet soon. Please call. Thanks

## 2016-12-03 NOTE — Telephone Encounter (Signed)
That is very, very concerning.  Needs ER eval.  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

## 2016-12-03 NOTE — Telephone Encounter (Signed)
Called and spoke with daughter.  She states patient had a hypertensive episode yesterday where her blood pressure was 210/140 and it took awhile to go back down. She states has had significant left leg weakness after this incident.  No slurred speech. No weakness in other limbs. Increased fatigue.   She has an appt scheduled with PCP. Patient's daughter made aware patient needs seen emergently to r/o stroke based on symptoms. Patient's daughter states they "ruled this out" at the assisted living yesterday. 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 She has not had any scans/labs/etc.   She asks repeatedly to come in to see Dr. Carles Collet. I made her aware PD will cause hypotension-not hypertension and weakness/mobility issues will usually affect both sides and will not acutely get worse.   I again stressed importance of emergent visit to ER to evaluate.   Dr. Carles Collet please advise if any other advise.

## 2016-12-04 ENCOUNTER — Telehealth: Payer: Self-pay | Admitting: *Deleted

## 2016-12-04 ENCOUNTER — Ambulatory Visit (INDEPENDENT_AMBULATORY_CARE_PROVIDER_SITE_OTHER): Payer: Medicare Other | Admitting: Family Medicine

## 2016-12-04 ENCOUNTER — Encounter: Payer: Self-pay | Admitting: Family Medicine

## 2016-12-04 VITALS — BP 146/84 | HR 59 | Temp 98.4°F | Ht 60.0 in

## 2016-12-04 DIAGNOSIS — R5383 Other fatigue: Secondary | ICD-10-CM | POA: Diagnosis not present

## 2016-12-04 LAB — COMPREHENSIVE METABOLIC PANEL
ALBUMIN: 3.9 g/dL (ref 3.5–5.2)
ALT: 3 U/L (ref 0–35)
AST: 20 U/L (ref 0–37)
Alkaline Phosphatase: 39 U/L (ref 39–117)
BILIRUBIN TOTAL: 0.9 mg/dL (ref 0.2–1.2)
BUN: 21 mg/dL (ref 6–23)
CHLORIDE: 104 meq/L (ref 96–112)
CO2: 30 mEq/L (ref 19–32)
CREATININE: 0.93 mg/dL (ref 0.40–1.20)
Calcium: 9.5 mg/dL (ref 8.4–10.5)
GFR: 61.2 mL/min (ref >60.00)
Glucose, Bld: 92 mg/dL (ref 70–99)
Potassium: 3.8 mEq/L (ref 3.5–5.1)
SODIUM: 143 meq/L (ref 135–145)
Total Protein: 6.4 g/dL (ref 6.0–8.3)

## 2016-12-04 LAB — CBC
HCT: 41.4 % (ref 36.0–46.0)
Hemoglobin: 14 g/dL (ref 12.0–15.0)
MCHC: 33.8 g/dL (ref 30.0–36.0)
MCV: 95.1 fl (ref 78.0–100.0)
PLATELETS: 144 10*3/uL — AB (ref 150.0–400.0)
RBC: 4.35 Mil/uL (ref 3.87–5.11)
RDW: 13.5 % (ref 11.5–15.5)
WBC: 5.4 10*3/uL (ref 4.0–10.5)

## 2016-12-04 LAB — T4, FREE: Free T4: 0.76 ng/dL (ref 0.60–1.60)

## 2016-12-04 LAB — TSH: TSH: 3.78 u[IU]/mL (ref 0.35–4.50)

## 2016-12-04 NOTE — Progress Notes (Signed)
Chief Complaint  Patient presents with  . Fatigue    since Sunday-pt states she feel she is not getting any stronger    Subjective: Patient is a 83 y.o. female here for fatigue. Here with daughter [name] who helps a lot with the hx.  For around 1 month, the patient is bending having worsening fatigue. It got particularly worse 3 days ago. She does not feel weak until fatigue sets in. She is not short of breath and is not having any pain anywhere. Of note, she recently moved living facilities and her previous physical therapist was very good. Her physical therapist this time around had different goals No recent illnesses, fevers, cough, shortness of breath, change in medication, change in oral intake, mood changes, diarrhea, or bleeding. She does bruise easily, however this is not new.   ROS: Heart: Denies chest pain  Lungs: Denies SOB   Family History  Problem Relation Age of Onset  . Dementia Mother        Alzheimers  . Stroke Mother        ? 30  . Cancer Son        multiple myeloma  . Pancreatic cancer Son   . Heart disease Brother   . Lung cancer Maternal Uncle        smoker  . Heart attack Neg Hx   . Diabetes Neg Hx   . Hearing loss Neg Hx    Past Medical History:  Diagnosis Date  . Aortic stenosis, mild   . Gilbert syndrome   . HTN (hypertension)   . Hyperlipidemia   . Hypothyroidism   . Melanoma ([place])   . Neuropathy   . Osteoporosis   . Parkinson's disease ([place])   . Peripheral neuropathy    Bilateral  . Rheumatic fever    does not take Flu shot    Allergies  Allergen Reactions  . Sulfonamide Derivatives     REACTION:generalized  swelling  . Sulfa Antibiotics Swelling    Current Outpatient Prescriptions:  .  acetaminophen (TYLENOL) 325 MG tablet, Take 650 mg by mouth every 6 (six) hours as needed., Disp: , Rfl:  .  Calcium Carbonate-Vitamin D (CALCIUM 600/VITAMIN D PO), Take 1 tablet by mouth daily. , Disp: , Rfl:  .  carbidopa-levodopa (SINEMET IR)  25-100 MG tablet, Take 1 tablet by mouth 3 (three) times daily. , Disp: , Rfl: 0 .  Cholecalciferol (VITAMIN D) 2000 UNITS CAPS, Take 1 capsule (2,000 Units total) by mouth daily., Disp: 30 capsule, Rfl: 3 .  levothyroxine (SYNTHROID, LEVOTHROID) 50 MCG tablet, Take one on Sunday, Monday, Wednesday, and Friday, Disp: , Rfl:  .  levothyroxine (SYNTHROID, LEVOTHROID) 75 MCG tablet, Take one on Tuesday, Thursday, and Saturday., Disp: , Rfl: 0 .  magnesium citrate SOLN, Take 1 Bottle by mouth daily as needed. , Disp: 195 mL, Rfl:  .  Multiple Vitamin (MULTIVITAMIN) capsule, Take 1 capsule by mouth daily.  , Disp: , Rfl:  .  omeprazole (PRILOSEC) 20 MG capsule, Take 20 mg by mouth daily. , Disp: , Rfl: 1 .  polyvinyl alcohol (LIQUIFILM TEARS) 1.4 % ophthalmic solution, 1 drop as needed for dry eyes., Disp: , Rfl:   Objective: BP (!) 146/84 (BP Location: Left Arm, Patient Position: Sitting, Cuff Size: Normal)   Pulse (!) 59   Temp 98.4 F (36.9 C) (Oral)   Ht 5' (1.524 m)   SpO2 96%  General: Awake, appears stated age HEENT: MMM, EOMi Heart: RRR, 3/6 SEM  heard loudest at aortic listeing post Lungs: CTAB, no rales, wheezes or rhonchi. No accessory muscle use MSK: In wheelchair, unsteady gait, 5/5 strength in UE's b/l with exception of 4/5 L elbow extension, 4/5 LE strength throughout b/l Psych: Limited judgment and insight  Assessment and Plan: Fatigue, unspecified type - Plan: CBC, Comprehensive metabolic panel, TSH, T4, free  Orders as above. Let's see if she improves with reg PT focusing on strengthening, stability of gait and functionality. If no improvement in 2-4 weeks, will get an Echo to make sure AS has not significantly worsened. F/u pending above. The patient and her daughter voiced understanding and agreement to the plan.  [place], DO 12/04/16  10:37 AM

## 2016-12-04 NOTE — Telephone Encounter (Signed)
Received Transfer/Discharge Summary/Case Conference Report, forwarded to provider/SLS 07/11

## 2016-12-04 NOTE — Patient Instructions (Signed)
Let me know if things are not improving.  Give [place] 2-3 business days to get the results of your labs back.

## 2016-12-06 DIAGNOSIS — G20 Parkinson's disease: Secondary | ICD-10-CM | POA: Diagnosis not present

## 2016-12-06 DIAGNOSIS — R278 Other lack of coordination: Secondary | ICD-10-CM | POA: Diagnosis not present

## 2016-12-07 ENCOUNTER — Encounter (HOSPITAL_COMMUNITY): Payer: Self-pay | Admitting: Emergency Medicine

## 2016-12-07 ENCOUNTER — Emergency Department (HOSPITAL_COMMUNITY): Payer: Medicare Other

## 2016-12-07 ENCOUNTER — Emergency Department (HOSPITAL_COMMUNITY)
Admission: EM | Admit: 2016-12-07 | Discharge: 2016-12-07 | Disposition: A | Discharge status: Home / Self Care | Payer: Medicare Other | Attending: Emergency Medicine | Admitting: Emergency Medicine

## 2016-12-07 DIAGNOSIS — I10 Essential (primary) hypertension: Secondary | ICD-10-CM | POA: Diagnosis not present

## 2016-12-07 DIAGNOSIS — Z79899 Other long term (current) drug therapy: Secondary | ICD-10-CM | POA: Diagnosis not present

## 2016-12-07 DIAGNOSIS — F919 Conduct disorder, unspecified: Secondary | ICD-10-CM | POA: Diagnosis not present

## 2016-12-07 DIAGNOSIS — I6789 Other cerebrovascular disease: Secondary | ICD-10-CM | POA: Diagnosis not present

## 2016-12-07 DIAGNOSIS — F4489 Other dissociative and conversion disorders: Secondary | ICD-10-CM | POA: Diagnosis not present

## 2016-12-07 DIAGNOSIS — F69 Unspecified disorder of adult personality and behavior: Secondary | ICD-10-CM

## 2016-12-07 DIAGNOSIS — G20 Parkinson's disease: Secondary | ICD-10-CM | POA: Diagnosis not present

## 2016-12-07 DIAGNOSIS — E785 Hyperlipidemia, unspecified: Secondary | ICD-10-CM | POA: Diagnosis not present

## 2016-12-07 DIAGNOSIS — E039 Hypothyroidism, unspecified: Secondary | ICD-10-CM | POA: Insufficient documentation

## 2016-12-07 DIAGNOSIS — R4182 Altered mental status, unspecified: Secondary | ICD-10-CM | POA: Diagnosis not present

## 2016-12-07 HISTORY — DX: Other constipation: K59.09

## 2016-12-07 LAB — URINALYSIS, ROUTINE W REFLEX MICROSCOPIC
BILIRUBIN URINE: NEGATIVE
GLUCOSE, UA: NEGATIVE mg/dL
HGB URINE DIPSTICK: NEGATIVE
KETONES UR: NEGATIVE mg/dL
Leukocytes, UA: NEGATIVE
Nitrite: NEGATIVE
PROTEIN: NEGATIVE mg/dL
Specific Gravity, Urine: 1.01 (ref 1.005–1.030)
pH: 8 (ref 5.0–8.0)

## 2016-12-07 MED ORDER — ACETAMINOPHEN 325 MG PO TABS: 650.0000 mg | ORAL_TABLET | Freq: Every evening | ORAL | 0 refills | Status: DC | PRN

## 2016-12-07 NOTE — ED Provider Notes (Signed)
Natalie Lam   Natalie Lam Arrival date & time: 12/07/16  1115     History   Chief Complaint Chief Complaint  Patient presents with  . Altered Mental Status    HPI Natalie Lam is a 83 y.o. female.  The history is provided by the patient and a relative. No language interpreter was used.  Altered Mental Status     Natalie Lam is a 83 y.o. female who presents to the Emergency Department complaining of AMS.  Level V caveat due to AMS.  She has a history of Parkinson's disease and is accompanied by her daughter. She is currently residing in assisted living and is had problems with behavior and memory problems over the last 2 weeks with progressive paranoia and hallucinations. Last night she was up all night and had disassembled a picture frame and had put multiple post-it notes on the wall stating "Do not post on Facebook". Patient states she has no problems and feels well and has no complaints. She denies any paranoia or hallucinations.  She was recently seen by her PCP and had labs drawn with no acute abnormalities. Her daughter notes that this morning she had spikes in her blood pressure to 588F systolic and she had a dazed and glazed look in her eyes. Past Medical History:  Diagnosis Date  . Aortic stenosis, mild   . Constipation, chronic   . Gilbert syndrome   . HTN (hypertension)   . Hyperlipidemia   . Hypothyroidism   . Melanoma ([place])   . Neuropathy   . Osteoporosis   . Parkinson's disease ([place])   . Peripheral neuropathy    Bilateral  . Rheumatic fever    does not take Flu shot     Patient Active Problem List   Diagnosis Date Noted  . Parkinson's disease ([place]) 09/13/2015  . Mild cognitive impairment with memory loss 04/22/2015  . Other fatigue 04/22/2015  . Urge urinary incontinence 04/22/2015  . Unsteady gait 04/22/2015  . Chronic venous insufficiency 04/22/2015  . Hereditary and idiopathic peripheral neuropathy 04/22/2015  .  Thoracic compression fracture ([place]) 01/11/2015  . History of melanoma 12/09/2014  . Torticollis 12/09/2014  . Bilateral carotid bruits 08/18/2014  . Aortic valve stenosis 08/18/2014  . Syncope 08/11/2014  . Gilbert's syndrome 08/11/2014  . Heart murmur, systolic 02/77/4128  . HTN (hypertension) 06/25/2013  . Skin cancer 07/14/2012  . Abnormal serum level of alkaline phosphatase 04/16/2012  . Esophageal reflux 06/27/2010  . THYROID NODULE, LEFT 04/18/2009  . Vitamin D deficiency 10/01/2007  . Hypothyroidism 08/21/2007  . HLD (hyperlipidemia) 08/21/2007  . Varicose veins of both lower extremities 08/21/2007  . Senile osteoporosis 08/21/2007    Past Surgical History:  Procedure Laterality Date  . ABDOMINAL HYSTERECTOMY  1963   no BSO; no abnormal PAPs  . COLONOSCOPY  2000   negative; [place]  . [place]  . MELANOMA EXCISION  08/2014    OB History    No data available       Home Medications    Prior to Admission medications   Medication Sig Start Date End Date Taking? Authorizing Provider  acetaminophen (TYLENOL) 325 MG tablet Take 650 mg by mouth every 6 (six) hours as needed.    [provider]  Calcium Carbonate-Vitamin D (CALCIUM 600/VITAMIN D PO) Take 1 tablet by mouth daily.     [provider]  carbidopa-levodopa (SINEMET IR) 25-100 MG tablet Take 1 tablet by mouth 3 (three)  times daily.  09/28/15   [provider]  Cholecalciferol (VITAMIN D) 2000 UNITS CAPS Take 1 capsule (2,000 Units total) by mouth daily. 01/11/15   Gayland Curry, DO  levothyroxine (SYNTHROID, LEVOTHROID) 50 MCG tablet Take one on Sunday, Monday, Wednesday, and Friday    [provider]  levothyroxine Wilmer Floor, LEVOTHROID) 75 MCG tablet Take one on Tuesday, Thursday, and Saturday. 08/22/15   [provider]  magnesium citrate SOLN Take 1 Bottle by mouth daily as needed.  04/08/16   Shelda Pal, DO  Multiple Vitamin  (MULTIVITAMIN) capsule Take 1 capsule by mouth daily.      [provider]  omeprazole (PRILOSEC) 20 MG capsule Take 20 mg by mouth daily.  10/04/15   [provider]  polyvinyl alcohol (LIQUIFILM TEARS) 1.4 % ophthalmic solution 1 drop as needed for dry eyes.    [provider]    Family History Family History  Problem Relation Age of Onset  . Dementia Mother        Alzheimers  . Stroke Mother        ? 1  . Cancer Son        multiple myeloma  . Pancreatic cancer Son   . Heart disease Brother   . Lung cancer Maternal Uncle        smoker  . Heart attack Neg Hx   . Diabetes Neg Hx   . Hearing loss Neg Hx     Social History Social History  Substance Use Topics  . Smoking status: Never Smoker  . Smokeless tobacco: Never Used  . Alcohol use No     Allergies   Sulfonamide derivatives and Sulfa antibiotics   Review of Systems Review of Systems  All other systems reviewed and are negative.    Physical Exam Updated Vital Signs BP (!) 174/96   Pulse (!) 58   Temp 98 F (36.7 C) (Oral)   Resp 14   SpO2 98%   Physical Exam  Constitutional: She is oriented to person, place, and time. She appears well-developed and well-nourished.  HENT:  Head: Normocephalic and atraumatic.  Cardiovascular: Normal rate and regular rhythm.   Murmur heard. Systolic ejection murmur  Pulmonary/Chest: Effort normal and breath sounds normal. No respiratory distress.  Abdominal: Soft. There is no tenderness. There is no rebound and no guarding.  Musculoskeletal: She exhibits no edema or tenderness.  Neurological: She is alert and oriented to person, place, and time.  5 out of 5 strength in all 4 extremities with sensation to light touch intact in all 4 extremities.  Skin: Skin is warm and dry.  Psychiatric: She has a normal mood and affect. Her behavior is normal.  Nursing Lam and vitals reviewed.    ED Treatments / Results  Labs (all labs ordered are  listed, but only abnormal results are displayed) Labs Reviewed  URINALYSIS, ROUTINE W REFLEX MICROSCOPIC    EKG  EKG Interpretation None       Radiology No results found.  Procedures Procedures (including critical care time)  Medications Ordered in ED Medications - No data to display   Initial Impression / Assessment and Plan / ED Course  I have reviewed the triage vital signs and the nursing notes.  Pertinent labs & imaging results that were available during my care of the patient were reviewed by me and considered in my medical decision making (see chart for details).   patient with history of Parkinson's disease here with behavioral disturbances  for the last 2 weeks. In the emergency department she is calm alert and appropriate focal neurologic findings. CT scan of the brain with chronic changes, no acute abnormality. UA with no evidence of UTI. Patient recently had outpatient laboratory studies performed and those were reviewed with no acute significant abnormality. Plan to DC home with outpatient neurology follow-up. Will attempt to get additional home health assistance given new behavioral disturbances. Outpatient follow-up and return precautions discussed  Final Clinical Impressions(s) / ED Diagnoses   Final diagnoses:  Behavior concern in adult    New Prescriptions New Prescriptions   No medications on file     Quintella Reichert, MD 12/07/16 [phone]

## 2016-12-07 NOTE — ED Triage Notes (Addendum)
Patient from [facility] assisted living for bizarre behavior.  Patient answers questions appropriately and is oriented to self, location, time, and situation.  Patient has history of Parkinson's disease.   Patient states she does not feel like she is abnormal, family is concerned about recent behavior this week.  Patient has disassembled heavy picture frames that were hanging on the wall and placed sticky notes on the wall of her room with messages from deceased relatives.

## 2016-12-07 NOTE — Care Management Note (Signed)
Case Management Note  Patient Details  Name: DRISANA SCHWEICKERT MRN: [id number] Date of Birth: 05-25-1932  Subjective/Objective:   Received consult for this 83 y.o. Resident of [facility]. MD has ordered HHPT and Nurse Aide. CM called NCM @ [facility] @ [phone] who referred me to [facility] @ [phone]. They will converse among themselves and take care of this issue.                  Action/Plan: Will await return call.    Expected Discharge Date:                  Expected Discharge Plan:  [place]  In-House Referral:     Discharge planning Services  CM Consult  Post Acute Care Choice:    Choice offered to:     DME Arranged:    DME Agency:     HH Arranged:  PT, Nurse's Aide [place] Agency:     Status of Service:  In process, will continue to follow  If discussed at Long Length of Stay Meetings, dates discussed:    Additional Comments:  Delrae Sawyers, RN 12/07/2016, 3:42 PM

## 2016-12-09 ENCOUNTER — Encounter: Payer: Self-pay | Admitting: Neurology

## 2016-12-09 ENCOUNTER — Telehealth: Payer: Self-pay | Admitting: Neurology

## 2016-12-09 ENCOUNTER — Telehealth: Payer: Self-pay | Admitting: Family Medicine

## 2016-12-09 ENCOUNTER — Ambulatory Visit (INDEPENDENT_AMBULATORY_CARE_PROVIDER_SITE_OTHER): Payer: Medicare Other | Admitting: Neurology

## 2016-12-09 VITALS — BP 124/80 | HR 64 | Ht 60.0 in | Wt 126.0 lb

## 2016-12-09 DIAGNOSIS — R443 Hallucinations, unspecified: Secondary | ICD-10-CM

## 2016-12-09 DIAGNOSIS — Z85820 Personal history of malignant melanoma of skin: Secondary | ICD-10-CM

## 2016-12-09 DIAGNOSIS — R29898 Other symptoms and signs involving the musculoskeletal system: Secondary | ICD-10-CM | POA: Diagnosis not present

## 2016-12-09 DIAGNOSIS — G20 Parkinson's disease: Secondary | ICD-10-CM | POA: Diagnosis not present

## 2016-12-09 DIAGNOSIS — F0280 Dementia in other diseases classified elsewhere without behavioral disturbance: Secondary | ICD-10-CM | POA: Diagnosis not present

## 2016-12-09 NOTE — Telephone Encounter (Signed)
[facility] - [facility]- [phone]   Called in to speak with CMA about pt's BP. Please call back at # above.

## 2016-12-09 NOTE — Patient Instructions (Signed)
1. We have sent a referral to [facility] for your MRI and they will call you directly to schedule your appt. They are located at [place]. If you need to contact them directly please call [phone].  2. Start Melatonin 3 mg at bedtime.

## 2016-12-09 NOTE — Telephone Encounter (Signed)
I called the nurse number and had to leave a message.  Asked that she call back as soon as possible.

## 2016-12-09 NOTE — Telephone Encounter (Signed)
See ER note and advise if she needs to follow up with you.

## 2016-12-09 NOTE — Telephone Encounter (Signed)
[facility] [phone]   Judeen Hammans called to schedule a Emergency Room FU appointment for Natalie Lam, she was seen for Hypertensive Episode and Delirum. Dr Tat is booking out until October, is there an earlier appointment we can put her in.

## 2016-12-09 NOTE — Telephone Encounter (Signed)
She can come at 2:30 today but I will only have a short time with her as this is a slot where someone cancelled.  Please advise that I don't take care of HTN and someone/PCP will need to be following her for that.

## 2016-12-09 NOTE — Telephone Encounter (Signed)
Dr. Tat - FYI. 

## 2016-12-09 NOTE — Telephone Encounter (Signed)
Per Dr. Carles Collet schedule with the nurse for 2:00 pm today.

## 2016-12-09 NOTE — Progress Notes (Signed)
Natalie Lam was seen today in the movement disorders clinic for neurologic consultation at the request of Shelda Pal, DO.  The patient is accompanied by her daughter who supplements the history.  Prior records made available to me were reviewed.  The patient has a history of gait instability.  Pts sx's started in March, 2016.  Pt thinks that it started quickly.  She remembers one particular day in March that she went to [facility] and she backed into the space and she realized that she tried to walk into [employer] and just felt that she would fall if she didn't get back in the car.  She had a near syncopal episode which was later determined due to HTN and mild AS.  After that, she seemed to go slowly down hill.  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  She had PT in May which helped her back but didn't really seem to help the gait.  Pt is getting ready to move to [facility] assisted living.  Pt states that she just has a "fear of falling because so many people my age do."  Admits that depression contributes to sx's but states that doing better than she was.  Pt denies lateralizing weakness/paresthesias.  Denies speech change but states that she "tires easily."  Denies feet paresthesias.      Specific Symptoms:  Tremor: No. Voice: denies hypophonic speech Sleep: sleeps well  Vivid Dreams:  Yes.   (only if eats at night)  Acting out dreams:  No. Wet Pillows: No. Postural symptoms:  Yes.   (no cane/walker to ambulate)  Falls?  No. Bradykinesia symptoms: slow movements Loss of smell:  No. Loss of taste:  No. Urinary Incontinence:  No. Difficulty Swallowing:  No. Handwriting, micrographia: No. (perhaps a little smaller) Trouble with ADL's:  No.  Trouble buttoning clothing: No. Depression:  Yes.   (multiple deaths in family) Memory changes:  Yes.   (but  complicated by deaths, moving, closing family business; still drives in familiar territory and parks far away to avoid other cars; pays monthly bills without issue; cooks for self until moves to [facility]) Hallucinations:  No.  visual distortions: No. N/V:  No. Lightheaded:  No.   Neuroimaging has previously been performed.  It is  available for my review today.  A CT of the brain was performed on 08/12/2014 after near syncopal episode.  I reviewed this.  There is atrophy and rather significant cervical small vessel disease.  A carotid ultrasound was performed on 09/06/2014.  This demonstrated 1-49% stenosis bilaterally.  09/13/15 update:  The patient is following up today, accompanied by her daughter who supplements the history.  I have not seen her since July, 2016 and that was only a one time visit.  She does have a history of peripheral neuropathy, which has led to gait instability.  Her peripheral neuropathy has also caused paresthesias in the feet and according to records she was started on gabapentin by the nurse practitioner at [facility] in October, 2016.  She states that she is f/u due to trouble walking for about 6 weeks.  Her daughter is a Marine scientist and agrees that the majority of the issue started about 6 weeks ago, with no new med changes.  Pt states that she is getting stuck in doorways.  "Once I get going, I am okay."  11/24/15 update:  Pt  is f/u today re: newly dx PD last visit. This patient is accompanied in the office by her child who supplements the history.  Reviewed records since last visit.  On levodopa tid now.  Tolerating it well.  Feels that she is doing much better.  States that she feels stronger in the legs and "I don't have to work to get across the doorway."  No hallucinations.  No lightheaded/near syncope.  No falls since last visit.  Did PT/OT at [facility] and feels it helped.  Uses rollator.    02/26/16 update:  The patient follows up regarding her Parkinson's disease.  She  remains on carbidopa/levodopa 25/100, one tablet 3 times per day.  She reports that she has been doing well.  No falls.  No lightheadedness or near syncope.  She is using her Rollator faithfully but "with my wheels I can travel."  She did go to the emergency room with abdominal pain, which was ultimately felt due to constipation.  She is taking MiraLAX but doesn't use it daily.  She is still having trouble balancing constipation with diarrhea.  Daughter that Baker Pierini was started almost a month ago.  She is on 13mg once per day.  Because of intermittent diarrhea, she has cut back on exercise some.  Lives in assisted living and gets assist with medication, bath, dressing (putting on hose especially).  Meals prepared.  She is [facility]    06/20/16 update:  Patient follows up today, on carbidopa/levodopa 25/100, 1 tablet 3 times a day.  She is accompanied by her granddaughter who supplements the history.  [facility] moved from [facility] since our last visit.  When she moved, her levodopa was decreased from 1-1/2 tablets to 1 tablet 3 times per day.  Her daughter ended up calling me about this, and I left it at that dose because her daughter also stated that she was having hallucinations.  It was unclear if these were actual hallucinations, or if these were arising out of sleep.  She followed up with her primary care physician.  A urinalysis was unremarkable.  Denies any hallucination or visual distortions today.  Daughter states having paranoia.  Not sleeping well because she worries that there are things going on outside her room (pt denies this but daughter relates that).  daughter relates that if door closes she becomes hyperfocused on why it closed.  Pt relates that it is much better now as she has gotten used to the noises of the new facility.  Working with PT.  Daughter concerned that PT goal is to get her off of the walker.  11/19/16 update:  Patient seen today in follow-up.  Patient is  accompanied by her daughter who supplements the history.  She remains on carbidopa/levodopa 25/100, one tablet 3 times per day.  The records that were made available to me were reviewed.  [facility]  Daughter states that she is in enhanced assisted living, costing $11,000-$12,000 per month.  She is getting assist with meds, bathing, dressing, meals but daughter doesn't think that she needs the enhanced part of assisted living.  Her daughter states that the only difference between regular and enhanced assisted living is the CNA level (1:12 vs 1:4-6 patients).  No falls except states that she slid off toilet once when trying to put on panties.      12/09/16 update:  Patient seen as a work in today.  Records were reviewed.  Accompanied by  daughter who supplements the hx.   Last visit, her daughter had asked me to write a letter stating that she no longer needed in enhanced assisted living, because the patient was doing so well.  However, that is not the case.  The patient just went to the emergency room 2 days ago with strange behavior.  She had apparently disassembled a collage picture frame and placed Post-it notes over the wall stating "do not post on facebook."  Pt states today that she took down a photo frame to put up new photos and put up post up notes stating where she would want new photos to go.  Denies that the post its had anything on them. Pts daughter state that agitation/lack of sleep/insomnia happened the night before.  she has had some day/night reversal.   There were no hallucinations.  She had no urinary tract infection and was noted to have no abnormal behavior in the emergency room.  Her daughter told the emergency room physician that strange behavior have been going on for 2 weeks and she states today that perhaps it has been going on for a week.  Pts blood pressure has been intermittently quite elevated over the last few weeks (2 episodes per daughter).  Her  CT of the brain in the emergency room was nonacute.  No labs were done in the emergency room except a urinalysis.  Daughter states that pt has "lost about half of ability to walk" but unsure why.  She is weak.  Seems mostly due to fatigue.  She has PT ordered but not yet started.      ALLERGIES:   Allergies  Allergen Reactions  . Sulfonamide Derivatives     REACTION:generalized  swelling  . Sulfa Antibiotics Swelling    CURRENT MEDICATIONS:  Outpatient Encounter Prescriptions as of 12/09/2016  Medication Sig  . acetaminophen (TYLENOL) 325 MG tablet Take 2 tablets (650 mg total) by mouth at bedtime as needed for mild pain.  . Calcium Carbonate-Vitamin D (CALCIUM 600/VITAMIN D PO) Take 1 tablet by mouth daily.   . carbidopa-levodopa (SINEMET IR) 25-100 MG tablet Take 1 tablet by mouth 3 (three) times daily.   . Cholecalciferol (VITAMIN D) 2000 UNITS CAPS Take 1 capsule (2,000 Units total) by mouth daily.  Marland Kitchen levothyroxine (SYNTHROID, LEVOTHROID) 50 MCG tablet Take one on Sunday, Monday, Wednesday, and Friday  . levothyroxine (SYNTHROID, LEVOTHROID) 75 MCG tablet Take one on Tuesday, Thursday, and Saturday.  . magnesium citrate SOLN Take 1 Bottle by mouth daily as needed.   . Multiple Vitamin (MULTIVITAMIN) capsule Take 1 capsule by mouth daily.    Marland Kitchen omeprazole (PRILOSEC) 20 MG capsule Take 20 mg by mouth daily.   . polyvinyl alcohol (LIQUIFILM TEARS) 1.4 % ophthalmic solution 1 drop as needed for dry eyes.   No facility-administered encounter medications on file as of 12/09/2016.     PAST MEDICAL HISTORY:   Past Medical History:  Diagnosis Date  . Aortic stenosis, mild   . Constipation, chronic   . Gilbert syndrome   . HTN (hypertension)   . Hyperlipidemia   . Hypothyroidism   . Melanoma (HCC)   . Neuropathy   . Osteoporosis   . Parkinson's disease (HCC)   . Peripheral neuropathy    Bilateral  . Rheumatic fever    does not take Flu shot     PAST SURGICAL HISTORY:   Past  Surgical History:  Procedure Laterality Date  . ABDOMINAL HYSTERECTOMY  1963   no BSO;  no abnormal PAPs  . COLONOSCOPY  2000   negative; [place]  . [place]  . MELANOMA EXCISION  08/2014    SOCIAL HISTORY:   Social History   Social History  . Marital status: Widowed    Spouse name: N/A  . Number of children: N/A  . Years of education: N/A   Occupational History  . 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 Smoking status: Never Smoker  . Smokeless tobacco: Never Used  . Alcohol use No  . Drug use: No  . Sexual activity: Not on file   Other Topics Concern  . Not on file   Social History Narrative   Lives at [facility] 01/05/2015   Widowed   Never smoked   Exercise walking   Alcohol none   POA/Living Will          FAMILY HISTORY:   Family Status  Relation Status  . Mother Deceased       alzheimers, stroke  . Son Deceased       multiple myeloma  . Son Deceased       pancreatic cancer  . Father Deceased       unknown  . Brother Alive  . Brother Deceased  . Sister Alive  . Sister Alive  . Daughter Alive  . Daughter Deceased       decapitated in [place]  . Mat Uncle (Not Specified)  . Neg Hx (Not Specified)    ROS:   A complete 10 system review of systems was obtained and was unremarkable apart from what is mentioned above.  PHYSICAL EXAMINATION:    VITALS:   Vitals:   12/09/16 1359  BP: 124/80  Pulse: 64  SpO2: 96%  Weight: 126 lb (57.2 kg)  Height: 5' (1.524 m)    GEN:  The patient appears stated age and is in NAD. HEENT:  Normocephalic, atraumatic.  The mucous membranes are moist. The superficial temporal arteries are without ropiness or tenderness. CV:  RRR with 3/6 SEM Lungs:  CTAB Neck/HEME:  There are no carotid bruits bilaterally.  Neurological examination:  Orientation: Had trouble with trail making.  Did well with orientation. Montreal Cognitive Assessment  11/19/2016 12/13/2014    Visuospatial/ Executive (0/5) 1 3  Naming (0/3) 2 2  Attention: Read list of digits (0/2) 2 2  Attention: Read list of letters (0/1) 1 1  Attention: Serial 7 subtraction starting at 100 (0/3) 0 1  Language: Repeat phrase (0/2) 2 2  Language : Fluency (0/1) 0 1  Abstraction (0/2) 2 2  Delayed Recall (0/5) 0 0  Orientation (0/6) 6 6  Total 16 20  Adjusted Score (based on education) 16 21    Cranial nerves: There is good facial symmetry.  Extraocular muscles are intact. The visual fields are full to confrontational testing. The speech is fluent and clear. Soft palate rises symmetrically and there is no tongue deviation. Hearing is intact to conversational tone. Sensation: Sensation is intact to light touch throughout Motor: Strength is 5/5 in the bilateral upper and lower extremities.  No asymmetries noted.   Shoulder shrug is equal and symmetric.  There is no pronator drift.  Movement examination: Tone: There is normal tone bilaterally Abnormal movements: None Coordination:  There is decremation with finger taps, toe taps bilaterally and with heel taps on the L Gait and Station: The patient pushes off of the chair to get off.  Ambulates with  rollator.  She has start hesitation.  She does hesitate in the doorway.  She intermittently slows just for a second and momentarily freezes.  She drags the L leg with ambulation (this is not new).  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  She is able to walk better when reminded to take big steps or march  Labs:  Lab Results  Component Value Date   VITAMINB12 765 08/11/2014   No results found for: HGBA1C  Lab Results  Component Value Date   WBC 5.4 12/04/2016   HGB 14.0 12/04/2016   HCT 41.4 12/04/2016   MCV 95.1 12/04/2016   PLT 144.0 (L) 12/04/2016     Chemistry      Component Value Date/Time   NA 143 12/04/2016 0958   NA 137 08/03/2015 0524   K 3.8 12/04/2016 0958   CL 104 12/04/2016 0958   CO2 30 12/04/2016 0958   BUN 21  12/04/2016 0958   BUN 13 08/03/2015 0524   CREATININE 0.93 12/04/2016 0958   GLU 81 08/03/2015 0524      Component Value Date/Time   CALCIUM 9.5 12/04/2016 0958   ALKPHOS 39 12/04/2016 0958   AST 20 12/04/2016 0958   ALT 3 12/04/2016 0958   BILITOT 0.9 12/04/2016 0958     Lab Results  Component Value Date   TSH 3.78 12/04/2016      ASSESSMENT/PLAN:  1.  Parkinson's disease  -dx formally in 08/2015  -We discussed the diagnosis as well as pathophysiology of the disease.  We discussed treatment options as well as prognostic indicators.  Patient education was provided.  -continue carbidopa/levodopa 25/100 tid .  Higher dosage may have caused hallucinations but may need to consider trying going up again to see if would help freezing.  I am not sure if it would.  Discussed with patient and daughter today.    -has order for PT  -order MRI brain.  I don't think that the L leg dragging is stroke as have documented this the last several visits but we should look.  Daughter asked about sedating patient but I don't think that this would be a good idea. Will hold contrast given amount of extra time that this takes in the scanner even though we do know she has hx of melanoma.  Daughter not sure that she would be able to be still this long in scanner.   -start melatonin 3 mg at night.  Think that she does have some degree of day/night reversal.  Pt sensative to meds (thinks that even regular tylenol makes her sleepy).  Will try the melatonin first and see if helps.  Stay active during the day.  2.  Melanoma  -discussed with the patient that PD increases risk of melanoma and sees [facility] dermatology.    3.  Constipation  -doing much better and managing with prune juice, mag citrate and applesauce  4.  Follow up at regularly scheduled visit but will stay updated based on the above testing as well.  Much greater than 50% of this visit was spent in counseling and coordinating care.  Total face to  face time:  35 min

## 2016-12-09 NOTE — Telephone Encounter (Signed)
Spoke with the daughter and she will have her mother here at 2:00pm today.

## 2016-12-09 NOTE — Telephone Encounter (Signed)
Called and spoke with [facility] with [facility] and she stated that she wanted to make Dr. Nani Ravens aware that the pt went out on Sat and her BP was (205/100,222/104).  She was taking to the ED and was seen and told to take Tylenol and to see Dr. Hall Busing.  Pt's BP's on yest were (182/100,188/102).   Pt saw Dr. Hall Busing today and a Brain MRI w/o contrast was scheduled for (12/22/16), and was told to take Melatonin.  Pt's BP's for today (182/100,188/102-(L),172/90-(R).  Rechecked the BP 56mins later and BP:(179/92-(R),160/92-(L).  Each time the BP was taking the pt had been walking around before the BP was taking.//AB/CMA

## 2016-12-10 ENCOUNTER — Telehealth: Payer: Self-pay | Admitting: *Deleted

## 2016-12-10 DIAGNOSIS — G20 Parkinson's disease: Secondary | ICD-10-CM | POA: Diagnosis not present

## 2016-12-10 DIAGNOSIS — R278 Other lack of coordination: Secondary | ICD-10-CM | POA: Diagnosis not present

## 2016-12-10 NOTE — Telephone Encounter (Signed)
Received Physician Order Report from [facility], forwarded to provider/SLS 07/17

## 2016-12-10 NOTE — Telephone Encounter (Signed)
It's been slightly elevated at our office as well. Let her daughter Natalie Lam know and see if she would like to start something- I would consider lisinopril given her hx of LE edema (Norvasc and hydralazine are not great choices, I would also stay away from a diuretic if possible). Certainly our goal is not strict control, but these levels are not acceptable. TY.

## 2016-12-11 ENCOUNTER — Encounter: Payer: Self-pay | Admitting: Family Medicine

## 2016-12-11 ENCOUNTER — Telehealth: Payer: Self-pay | Admitting: *Deleted

## 2016-12-11 DIAGNOSIS — R278 Other lack of coordination: Secondary | ICD-10-CM | POA: Diagnosis not present

## 2016-12-11 DIAGNOSIS — G20 Parkinson's disease: Secondary | ICD-10-CM | POA: Diagnosis not present

## 2016-12-11 NOTE — Telephone Encounter (Signed)
Called and [facility] @ 4:49pm @ [phone]) asking the pt's daughter Natalie Lam) to RTC regarding the message below.//AB/CMA

## 2016-12-11 NOTE — Telephone Encounter (Signed)
error:[id number] ° °

## 2016-12-11 NOTE — Telephone Encounter (Signed)
Received OT Plan of Care; forwarded to provider/SLS 07/18

## 2016-12-12 ENCOUNTER — Telehealth: Payer: Self-pay | Admitting: *Deleted

## 2016-12-12 DIAGNOSIS — R278 Other lack of coordination: Secondary | ICD-10-CM | POA: Diagnosis not present

## 2016-12-12 DIAGNOSIS — G20 Parkinson's disease: Secondary | ICD-10-CM | POA: Diagnosis not present

## 2016-12-12 NOTE — Telephone Encounter (Signed)
Received Physician Case Communication/Plan of Care, forwarded to provider/SLS 07/19

## 2016-12-13 ENCOUNTER — Telehealth: Payer: Self-pay | Admitting: Family Medicine

## 2016-12-13 NOTE — Telephone Encounter (Signed)
Patient's daughter and caretaker [name] has been notified of my recommendations to come in for this. She thinks it may be related to sleep and has no intention of starting anything for now. Given this, I think the best plan would be to monitor BPs over the next week and schedule an appt in early Aug if it does not lower with improvement of sleep. Please run this by [name] and see if she agrees. Thanks.

## 2016-12-13 NOTE — Telephone Encounter (Signed)
Caller name: Mitz  Relation to pt: RN from [facility] Call back number: [phone]   Reason for call:  RN wanted to inform PCP of BP report stating a little high manually left arm 180/80 and same am shortly after 190/84, please advise

## 2016-12-13 NOTE — Telephone Encounter (Signed)
Called and [facility] @ 10:26am [phone]) asking the pt's daughter [name]) to RTC regarding the message below.//AB/CMA

## 2016-12-13 NOTE — Telephone Encounter (Signed)
Spoke with the pt's daughter [name]) and informed her of the message below.  She verbalized understanding.  She stated that her mother has not been sleeping.  She has had sleep downing.   She has been up for 2 days.  She went to see Dr. Carles Collet on Mon and he started her on Melatonin 3mg .  Her BP is doing good it is stable.   The pt is to have the MRI on Sun.  [name] stated that she does not want to have her mother start on anything else.//AB/CMA

## 2016-12-13 NOTE — Telephone Encounter (Signed)
Received completed and signed Physician Order Report from Dr. Nani Ravens.  Copy made to be scanned in the chart and original mailed to [facility].//AB/CMA

## 2016-12-16 NOTE — Telephone Encounter (Signed)
Called on (12/13/16) and LMOM @ 5:13pm @ [phone]) with the pt's daughter [name]) asking her to RTC regarding the message below.   Called on 12/13/16) and spoke with Mitz the RN from [place] and informed her of the message below.  She verbalized understanding and stated that she spoke with pt's daughter [name]) and she asked to give the pt Tylenol.  And it was given.  I informed Mitz that I tried calling Tonya and LMOM asking her to give me a call back.  She stated that she spoke with [name] and she stated to do what the doctor recommended.//AB/CMA

## 2016-12-17 DIAGNOSIS — G20 Parkinson's disease: Secondary | ICD-10-CM | POA: Diagnosis not present

## 2016-12-17 DIAGNOSIS — R278 Other lack of coordination: Secondary | ICD-10-CM | POA: Diagnosis not present

## 2016-12-17 DIAGNOSIS — G3184 Mild cognitive impairment, so stated: Secondary | ICD-10-CM | POA: Diagnosis not present

## 2016-12-17 DIAGNOSIS — R2689 Other abnormalities of gait and mobility: Secondary | ICD-10-CM | POA: Diagnosis not present

## 2016-12-17 DIAGNOSIS — G609 Hereditary and idiopathic neuropathy, unspecified: Secondary | ICD-10-CM | POA: Diagnosis not present

## 2016-12-17 DIAGNOSIS — R2681 Unsteadiness on feet: Secondary | ICD-10-CM | POA: Diagnosis not present

## 2016-12-18 DIAGNOSIS — R278 Other lack of coordination: Secondary | ICD-10-CM | POA: Diagnosis not present

## 2016-12-18 DIAGNOSIS — R2681 Unsteadiness on feet: Secondary | ICD-10-CM | POA: Diagnosis not present

## 2016-12-18 DIAGNOSIS — G20 Parkinson's disease: Secondary | ICD-10-CM | POA: Diagnosis not present

## 2016-12-18 DIAGNOSIS — G609 Hereditary and idiopathic neuropathy, unspecified: Secondary | ICD-10-CM | POA: Diagnosis not present

## 2016-12-18 DIAGNOSIS — G3184 Mild cognitive impairment, so stated: Secondary | ICD-10-CM | POA: Diagnosis not present

## 2016-12-18 DIAGNOSIS — R2689 Other abnormalities of gait and mobility: Secondary | ICD-10-CM | POA: Diagnosis not present

## 2016-12-19 ENCOUNTER — Telehealth: Payer: Self-pay | Admitting: *Deleted

## 2016-12-19 ENCOUNTER — Telehealth: Payer: Self-pay | Admitting: Family Medicine

## 2016-12-19 NOTE — Telephone Encounter (Signed)
Received PT Clarification Order and Plan of Care from [place] at [facility].  Placed in Dr. Nani Ravens folder to be signed.//AB/CMA

## 2016-12-19 NOTE — Telephone Encounter (Signed)
Copies of documentation made, send to scan; paperwork mailed to sender/SLS 07/26

## 2016-12-19 NOTE — Telephone Encounter (Signed)
Received Physician Orders from [place] [x2], the #1 Order was signed by someone with Sender, crossed out, but not initialed; note with paperwork forwarded to provider with SAS envelope, forwarded to provider/SLS 07/26

## 2016-12-19 NOTE — Telephone Encounter (Signed)
Received signed PT Clarification Order and Plan of Care from Dr. Nani Ravens.  Faxed to [place] with [facility] [employer] @ [phone]).  Confirmation received.//AB/CMA

## 2016-12-19 NOTE — Telephone Encounter (Signed)
Relation to pt: self Call back [phone]   Reason for call:  Patient would like PCP to write a letter stating patient is not competent to maintain her financial affairs, please advise

## 2016-12-20 ENCOUNTER — Telehealth: Payer: Self-pay | Admitting: *Deleted

## 2016-12-20 NOTE — Telephone Encounter (Signed)
OK. Written and printed.

## 2016-12-20 NOTE — Telephone Encounter (Signed)
Called and [facility] for the pt's daughter(Tonya) @ 10:16am @ [phone]) informed her that the letter she requested has been written and ready to be picked up.  I will leave the letter up front for her to pick up at her convenience.//AB/CMA

## 2016-12-20 NOTE — Telephone Encounter (Signed)
Received BP readings with request for Physician Order from [facility]; forwarded to provider/SLS 07/27

## 2016-12-22 ENCOUNTER — Ambulatory Visit
Admission: RE | Admit: 2016-12-22 | Discharge: 2016-12-22 | Disposition: A | Discharge status: Home / Self Care | Payer: Medicare Other | Source: Ambulatory Visit | Attending: Neurology | Admitting: Neurology

## 2016-12-22 DIAGNOSIS — R269 Unspecified abnormalities of gait and mobility: Secondary | ICD-10-CM | POA: Diagnosis not present

## 2016-12-22 DIAGNOSIS — R29898 Other symptoms and signs involving the musculoskeletal system: Secondary | ICD-10-CM

## 2016-12-22 DIAGNOSIS — R443 Hallucinations, unspecified: Secondary | ICD-10-CM

## 2016-12-23 ENCOUNTER — Telehealth: Payer: Self-pay | Admitting: Neurology

## 2016-12-23 DIAGNOSIS — R278 Other lack of coordination: Secondary | ICD-10-CM | POA: Diagnosis not present

## 2016-12-23 DIAGNOSIS — G609 Hereditary and idiopathic neuropathy, unspecified: Secondary | ICD-10-CM | POA: Diagnosis not present

## 2016-12-23 DIAGNOSIS — G20 Parkinson's disease: Secondary | ICD-10-CM | POA: Diagnosis not present

## 2016-12-23 DIAGNOSIS — G3184 Mild cognitive impairment, so stated: Secondary | ICD-10-CM | POA: Diagnosis not present

## 2016-12-23 DIAGNOSIS — R2681 Unsteadiness on feet: Secondary | ICD-10-CM | POA: Diagnosis not present

## 2016-12-23 DIAGNOSIS — R2689 Other abnormalities of gait and mobility: Secondary | ICD-10-CM | POA: Diagnosis not present

## 2016-12-23 NOTE — Telephone Encounter (Signed)
-----   Message from Alda Berthold, DO sent at 12/23/2016  9:43 AM EDT ----- Please inform patient that her MRI show moderate age-related changes and atrophy as well as multiple small areas of microhemorrhage- this is old.  It can cause memory changes in some individuals.  Specifically, there is nothing to explain left leg weakness.  She is not on any antiplatelet or anticoagulation therapy.  Dr. Carles Collet can review on her return and discuss further.

## 2016-12-23 NOTE — Telephone Encounter (Signed)
Patient's daughter made aware of results.  

## 2016-12-24 ENCOUNTER — Telehealth: Payer: Self-pay | Admitting: Neurology

## 2016-12-24 ENCOUNTER — Telehealth: Payer: Self-pay | Admitting: *Deleted

## 2016-12-24 NOTE — Telephone Encounter (Signed)
Misty called from [place] and wanted to know if Dr Tat has received PT's MRI results CB# [phone]

## 2016-12-24 NOTE — Telephone Encounter (Signed)
Received MRI Imaging report, forwarded to provider/SLS 07/31

## 2016-12-24 NOTE — Telephone Encounter (Signed)
Received Physician's Orders [x2] via mail, forwarded to provider with SAS envelope for return/SLS 07/31

## 2016-12-24 NOTE — Telephone Encounter (Signed)
[facility] and made aware we received results yesterday and called daughter with results.

## 2016-12-25 DIAGNOSIS — G3184 Mild cognitive impairment, so stated: Secondary | ICD-10-CM | POA: Diagnosis not present

## 2016-12-25 DIAGNOSIS — R278 Other lack of coordination: Secondary | ICD-10-CM | POA: Diagnosis not present

## 2016-12-25 DIAGNOSIS — R2689 Other abnormalities of gait and mobility: Secondary | ICD-10-CM | POA: Diagnosis not present

## 2016-12-25 DIAGNOSIS — G609 Hereditary and idiopathic neuropathy, unspecified: Secondary | ICD-10-CM | POA: Diagnosis not present

## 2016-12-25 DIAGNOSIS — G20 Parkinson's disease: Secondary | ICD-10-CM | POA: Diagnosis not present

## 2016-12-25 DIAGNOSIS — R2681 Unsteadiness on feet: Secondary | ICD-10-CM | POA: Diagnosis not present

## 2016-12-27 DIAGNOSIS — G3184 Mild cognitive impairment, so stated: Secondary | ICD-10-CM | POA: Diagnosis not present

## 2016-12-27 DIAGNOSIS — G609 Hereditary and idiopathic neuropathy, unspecified: Secondary | ICD-10-CM | POA: Diagnosis not present

## 2016-12-27 DIAGNOSIS — G20 Parkinson's disease: Secondary | ICD-10-CM | POA: Diagnosis not present

## 2016-12-27 DIAGNOSIS — R2689 Other abnormalities of gait and mobility: Secondary | ICD-10-CM | POA: Diagnosis not present

## 2016-12-27 DIAGNOSIS — R2681 Unsteadiness on feet: Secondary | ICD-10-CM | POA: Diagnosis not present

## 2016-12-27 DIAGNOSIS — R278 Other lack of coordination: Secondary | ICD-10-CM | POA: Diagnosis not present

## 2016-12-31 ENCOUNTER — Telehealth: Payer: Self-pay | Admitting: *Deleted

## 2016-12-31 DIAGNOSIS — G609 Hereditary and idiopathic neuropathy, unspecified: Secondary | ICD-10-CM | POA: Diagnosis not present

## 2016-12-31 DIAGNOSIS — R278 Other lack of coordination: Secondary | ICD-10-CM | POA: Diagnosis not present

## 2016-12-31 DIAGNOSIS — R2681 Unsteadiness on feet: Secondary | ICD-10-CM | POA: Diagnosis not present

## 2016-12-31 DIAGNOSIS — G3184 Mild cognitive impairment, so stated: Secondary | ICD-10-CM | POA: Diagnosis not present

## 2016-12-31 DIAGNOSIS — R2689 Other abnormalities of gait and mobility: Secondary | ICD-10-CM | POA: Diagnosis not present

## 2016-12-31 DIAGNOSIS — G20 Parkinson's disease: Secondary | ICD-10-CM | POA: Diagnosis not present

## 2016-12-31 NOTE — Telephone Encounter (Signed)
Received Physician Order report via mail, forwarded to provider with SAS envelope/SLS 08/07

## 2017-01-01 NOTE — Telephone Encounter (Signed)
Received signed Physician Order report from Dr. Nani Ravens.  Copy made to be scanned into the chart. Orders placed in envelope to be mailed to [facility] [facility].//AB/CMA

## 2017-01-06 DIAGNOSIS — R278 Other lack of coordination: Secondary | ICD-10-CM | POA: Diagnosis not present

## 2017-01-06 DIAGNOSIS — G3184 Mild cognitive impairment, so stated: Secondary | ICD-10-CM | POA: Diagnosis not present

## 2017-01-06 DIAGNOSIS — G20 Parkinson's disease: Secondary | ICD-10-CM | POA: Diagnosis not present

## 2017-01-06 DIAGNOSIS — R2689 Other abnormalities of gait and mobility: Secondary | ICD-10-CM | POA: Diagnosis not present

## 2017-01-06 DIAGNOSIS — G609 Hereditary and idiopathic neuropathy, unspecified: Secondary | ICD-10-CM | POA: Diagnosis not present

## 2017-01-06 DIAGNOSIS — R2681 Unsteadiness on feet: Secondary | ICD-10-CM | POA: Diagnosis not present

## 2017-01-10 DIAGNOSIS — G20 Parkinson's disease: Secondary | ICD-10-CM | POA: Diagnosis not present

## 2017-01-10 DIAGNOSIS — R278 Other lack of coordination: Secondary | ICD-10-CM | POA: Diagnosis not present

## 2017-01-10 DIAGNOSIS — R2681 Unsteadiness on feet: Secondary | ICD-10-CM | POA: Diagnosis not present

## 2017-01-10 DIAGNOSIS — R2689 Other abnormalities of gait and mobility: Secondary | ICD-10-CM | POA: Diagnosis not present

## 2017-01-10 DIAGNOSIS — G609 Hereditary and idiopathic neuropathy, unspecified: Secondary | ICD-10-CM | POA: Diagnosis not present

## 2017-01-10 DIAGNOSIS — G3184 Mild cognitive impairment, so stated: Secondary | ICD-10-CM | POA: Diagnosis not present

## 2017-01-13 ENCOUNTER — Telehealth: Payer: Self-pay | Admitting: *Deleted

## 2017-01-13 NOTE — Telephone Encounter (Signed)
Received Physician Orders from [place], forwarded to provider/SLS 08/20

## 2017-01-21 ENCOUNTER — Telehealth: Payer: Self-pay | Admitting: *Deleted

## 2017-01-21 NOTE — Telephone Encounter (Signed)
Received Physician Orders via mail from [place]; forwarded to provider with SAS envelope attached/SLS 08/28

## 2017-02-04 ENCOUNTER — Telehealth: Payer: Self-pay | Admitting: *Deleted

## 2017-02-04 NOTE — Telephone Encounter (Signed)
Received request for Medical Records for Annual F/U from [facility] on pt's Skin Cancer, Dx date 09/07/14; forwarded to provider/SLS 09/11

## 2017-02-14 ENCOUNTER — Telehealth: Payer: Self-pay | Admitting: *Deleted

## 2017-02-14 NOTE — Telephone Encounter (Signed)
Received Physician Orders from [place] via mail; forwarded to provider with SAS envelope attached/SLS 09/21

## 2017-03-07 ENCOUNTER — Telehealth: Payer: Self-pay | Admitting: *Deleted

## 2017-03-07 NOTE — Telephone Encounter (Signed)
Received Event Report for patient that she "lost balance and was observed on the floor"; the Event Details, Nursing Assessment, Pain Observation, Interventions are all blank on forms. The Evaluation states, "Event still open"; forwarded to provider/SLS 10/12

## 2017-03-10 ENCOUNTER — Telehealth: Payer: Self-pay | Admitting: *Deleted

## 2017-03-10 NOTE — Telephone Encounter (Signed)
Received Physician Orders from [place]; forwarded to provider with attached SAS envelope/SLS 10/15

## 2017-03-18 ENCOUNTER — Telehealth: Payer: Self-pay | Admitting: *Deleted

## 2017-03-18 NOTE — Telephone Encounter (Signed)
Received FYI regarding patient incidence via [facility]; forwarded to provider/SLS 10/23

## 2017-03-19 ENCOUNTER — Telehealth: Payer: Self-pay | Admitting: *Deleted

## 2017-03-19 NOTE — Telephone Encounter (Signed)
Received Physician Orders from [place]; forwarded to provider/SLS 10/24

## 2017-04-07 ENCOUNTER — Telehealth: Payer: Self-pay | Admitting: *Deleted

## 2017-04-07 NOTE — Telephone Encounter (Signed)
Received Physician Orders via mail from [place]; forwarded to provider/SLS 11/12

## 2017-04-22 ENCOUNTER — Telehealth: Payer: Self-pay | Admitting: *Deleted

## 2017-04-22 NOTE — Telephone Encounter (Signed)
Received request for last [facility] Medical Records from [place]; faxed/SLS 11/27

## 2017-04-24 ENCOUNTER — Telehealth: Payer: Self-pay | Admitting: *Deleted

## 2017-04-24 NOTE — Telephone Encounter (Signed)
Received Physician Orders from [place] via mail; forwarded to provider with SAS envelope/SLS 11/29

## 2017-04-29 ENCOUNTER — Telehealth: Payer: Self-pay | Admitting: *Deleted

## 2017-04-29 NOTE — Telephone Encounter (Signed)
Received Personal Care Physician Authorization and Plan of Care from [place]; forwarded to provider/SLS 12/04

## 2017-04-30 ENCOUNTER — Telehealth: Payer: Self-pay | Admitting: *Deleted

## 2017-04-30 NOTE — Telephone Encounter (Signed)
Received Physician Orders from [place]; forwarded to provider/SLS 12/05

## 2017-05-05 ENCOUNTER — Ambulatory Visit: Payer: BLUE CROSS/BLUE SHIELD | Admitting: Family Medicine

## 2017-05-08 NOTE — Progress Notes (Signed)
Natalie Lam was seen today in the movement disorders clinic for neurologic consultation at the request of Shelda Pal, DO.  The patient is accompanied by her daughter who supplements the history.  Prior records made available to me were reviewed.  The patient has a history of gait instability.  Pts sx's started in March, 2016.  Pt thinks that it started quickly.  She remembers one particular day in March that she went to [facility] and she backed into the space and she realized that she tried to walk into [employer] and just felt that she would fall if she didn't get back in the car.  She had a near syncopal episode which was later determined due to HTN and mild AS.  After that, she seemed to go slowly down hill.  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  She had PT in May which helped her back but didn't really seem to help the gait.  Pt is getting ready to move to [facility] assisted living.  Pt states that she just has a "fear of falling because so many people my age do."  Admits that depression contributes to sx's but states that doing better than she was.  Pt denies lateralizing weakness/paresthesias.  Denies speech change but states that she "tires easily."  Denies feet paresthesias.      Specific Symptoms:  Tremor: No. Voice: denies hypophonic speech Sleep: sleeps well  Vivid Dreams:  Yes.   (only if eats at night)  Acting out dreams:  No. Wet Pillows: No. Postural symptoms:  Yes.   (no cane/walker to ambulate)  Falls?  No. Bradykinesia symptoms: slow movements Loss of smell:  No. Loss of taste:  No. Urinary Incontinence:  No. Difficulty Swallowing:  No. Handwriting, micrographia: No. (perhaps a little smaller) Trouble with ADL's:  No.  Trouble buttoning clothing: No. Depression:  Yes.   (multiple deaths in family) Memory changes:  Yes.   (but  complicated by deaths, moving, closing family business; still drives in familiar territory and parks far away to avoid other cars; pays monthly bills without issue; cooks for self until moves to [facility]) Hallucinations:  No.  visual distortions: No. N/V:  No. Lightheaded:  No.   Neuroimaging has previously been performed.  It is  available for my review today.  A CT of the brain was performed on 08/12/2014 after near syncopal episode.  I reviewed this.  There is atrophy and rather significant cervical small vessel disease.  A carotid ultrasound was performed on 09/06/2014.  This demonstrated 1-49% stenosis bilaterally.  09/13/15 update:  The patient is following up today, accompanied by her daughter who supplements the history.  I have not seen her since July, 2016 and that was only a one time visit.  She does have a history of peripheral neuropathy, which has led to gait instability.  Her peripheral neuropathy has also caused paresthesias in the feet and according to records she was started on gabapentin by the nurse practitioner at [facility] in October, 2016.  She states that she is f/u due to trouble walking for about 6 weeks.  Her daughter is a Marine scientist and agrees that the majority of the issue started about 6 weeks ago, with no new med changes.  Pt states that she is getting stuck in doorways.  "Once I get going, I am okay."  11/24/15 update:  Pt  is f/u today re: newly dx PD last visit. This patient is accompanied in the office by her child who supplements the history.  Reviewed records since last visit.  On levodopa tid now.  Tolerating it well.  Feels that she is doing much better.  States that she feels stronger in the legs and "I don't have to work to get across the doorway."  No hallucinations.  No lightheaded/near syncope.  No falls since last visit.  Did PT/OT at [facility] and feels it helped.  Uses rollator.    02/26/16 update:  The patient follows up regarding her Parkinson's disease.  She  remains on carbidopa/levodopa 25/100, one tablet 3 times per day.  She reports that she has been doing well.  No falls.  No lightheadedness or near syncope.  She is using her Rollator faithfully but "with my wheels I can travel."  She did go to the emergency room with abdominal pain, which was ultimately felt due to constipation.  She is taking MiraLAX but doesn't use it daily.  She is still having trouble balancing constipation with diarrhea.  Daughter that Baker Pierini was started almost a month ago.  She is on 54mg once per day.  Because of intermittent diarrhea, she has cut back on exercise some.  Lives in assisted living and gets assist with medication, bath, dressing (putting on hose especially).  Meals prepared.  [facility]    06/20/16 update:  Patient follows up today, on carbidopa/levodopa 25/100, 1 tablet 3 times a day.  She is accompanied by her granddaughter who supplements the history.  [facility] since our last visit.  When she moved, her levodopa was decreased from 1-1/2 tablets to 1 tablet 3 times per day.  Her daughter ended up calling me about this, and I left it at that dose because her daughter also stated that she was having hallucinations.  It was unclear if these were actual hallucinations, or if these were arising out of sleep.  She followed up with her primary care physician.  A urinalysis was unremarkable.  Denies any hallucination or visual distortions today.  Daughter states having paranoia.  Not sleeping well because she worries that there are things going on outside her room (pt denies this but daughter relates that).  daughter relates that if door closes she becomes hyperfocused on why it closed.  Pt relates that it is much better now as she has gotten used to the noises of the new facility.  Working with PT.  Daughter concerned that PT goal is to get her off of the walker.  11/19/16 update:  Patient seen today in follow-up.  Patient is  accompanied by her daughter who supplements the history.  She remains on carbidopa/levodopa 25/100, one tablet 3 times per day.  The records that were made available to me were reviewed.  [facility]  Daughter states that she is in enhanced assisted living, costing $11,000-$12,000 per month.  She is getting assist with meds, bathing, dressing, meals but daughter doesn't think that she needs the enhanced part of assisted living.  Her daughter states that the only difference between regular and enhanced assisted living is the CNA level (1:12 vs 1:4-6 patients).  No falls except states that she slid off toilet once when trying to put on panties.      12/09/16 update:  Patient seen as a work in today.  Records were reviewed.  Accompanied by  daughter who supplements the hx.   Last visit, her daughter had asked me to write a letter stating that she no longer needed in enhanced assisted living, because the patient was doing so well.  However, that is not the case.  The patient just went to the emergency room 2 days ago with strange behavior.  She had apparently disassembled a collage picture frame and placed Post-it notes over the wall stating "do not post on facebook."  Pt states today that she took down a photo frame to put up new photos and put up post up notes stating where she would want new photos to go.  Denies that the post its had anything on them. Pts daughter state that agitation/lack of sleep/insomnia happened the night before.  she has had some day/night reversal.   There were no hallucinations.  She had no urinary tract infection and was noted to have no abnormal behavior in the emergency room.  Her daughter told the emergency room physician that strange behavior have been going on for 2 weeks and she states today that perhaps it has been going on for a week.  Pts blood pressure has been intermittently quite elevated over the last few weeks (2 episodes per daughter).  Her  CT of the brain in the emergency room was nonacute.  No labs were done in the emergency room except a urinalysis.  Daughter states that pt has "lost about half of ability to walk" but unsure why.  She is weak.  Seems mostly due to fatigue.  She has PT ordered but not yet started.     05/09/17 update: Patient is seen in follow-up today, accompanied by her daughter who supplements the history.  She had an MRI of the brain in July, after our last visit.  I have reviewed that MRI of the brain.  There was severe small vessel disease.  There is significant atrophy.  There is evidence of chronic microhemorrhages.  There was nothing acute.  She is still in physical therapy and she reports that she is doing well.  "It makes you feel better when you do it."   She remains on carbidopa/levodopa 25/100, 1 tablet 3 times per day.  She denies falls and then tells me one time "she swirled to the floor" and one time "she slid off of the commode."  Her daughter describes memory changes - "her cognitive decline is significant."  Discussed adding melatonin last visit and pt denies she is on it but daughter states that she did try it but she didn't want it any longer.  Pt states that she now remembers that "it was running her BP up."  She is sleeping well and "I need my sleep and my exercise."  No hallucinations.  She denies confusion.  States that she "stays busy" at [facility] but she cannot tell me what she does.   ALLERGIES:   Allergies  Allergen Reactions  . Sulfonamide Derivatives     REACTION:generalized  swelling  . Sulfa Antibiotics Swelling    CURRENT MEDICATIONS:  Outpatient Encounter Medications as of 05/09/2017  Medication Sig  . acetaminophen (TYLENOL) 325 MG tablet Take 2 tablets (650 mg total) by mouth at bedtime as needed for mild pain.  Marland Kitchen amLODipine (NORVASC) 5 MG tablet Take 5 mg by mouth daily.  . Calcium Carbonate-Vitamin D (CALCIUM 600/VITAMIN D PO) Take 1 tablet by mouth daily.   .  carbidopa-levodopa (SINEMET IR) 25-100 MG tablet Take 1 tablet by mouth 3 (three)  times daily.   . Cholecalciferol (VITAMIN D) 2000 UNITS CAPS Take 1 capsule (2,000 Units total) by mouth daily.  Marland Kitchen levothyroxine (SYNTHROID, LEVOTHROID) 50 MCG tablet Take one on Sunday, Monday, Wednesday, and Friday  . levothyroxine (SYNTHROID, LEVOTHROID) 50 MCG tablet Take 50 mcg by mouth daily before breakfast.  . magnesium citrate SOLN Take 1 Bottle by mouth daily as needed.   . Multiple Vitamin (MULTIVITAMIN) capsule Take 1 capsule by mouth daily.    Marland Kitchen omeprazole (PRILOSEC) 20 MG capsule Take 20 mg by mouth daily.   . polyvinyl alcohol (LIQUIFILM TEARS) 1.4 % ophthalmic solution 1 drop as needed for dry eyes.  . [DISCONTINUED] levothyroxine (SYNTHROID, LEVOTHROID) 75 MCG tablet Take one on Tuesday, Thursday, and Saturday.   No facility-administered encounter medications on file as of 05/09/2017.     PAST MEDICAL HISTORY:   Past Medical History:  Diagnosis Date  . Aortic stenosis, mild   . Constipation, chronic   . Gilbert syndrome   . HTN (hypertension)   . Hyperlipidemia   . Hypothyroidism   . Melanoma ([place])   . Neuropathy   . Osteoporosis   . Parkinson's disease ([place])   . Peripheral neuropathy    Bilateral  . Rheumatic fever    does not take Flu shot     PAST SURGICAL HISTORY:   Past Surgical History:  Procedure Laterality Date  . ABDOMINAL HYSTERECTOMY  1963   no BSO; no abnormal PAPs  . COLONOSCOPY  2000   negative; [place]  . [place]  . MELANOMA EXCISION  08/2014    SOCIAL HISTORY:   Social History   Socioeconomic History  . Marital status: Widowed    Spouse name: Not on file  . Number of children: Not on file  . Years of education: Not on file  . Highest education level: Not on file  Social Needs  . Financial resource strain: Not on file  . Food insecurity - worry: Not on file  . Food insecurity - inability: Not on file  . Transportation needs -  medical: Not on file  . Transportation needs - non-medical: Not on file  Occupational History  . Occupation: retired own [employer]  . Smoking status: Never Smoker  . Smokeless tobacco: Never Used  Substance and Sexual Activity  . Alcohol use: No  . Drug use: No  . Sexual activity: Not on file  Other Topics Concern  . Not on file  Social History Narrative   Lives at [facility] 01/05/2015   Widowed   Never smoked   Exercise walking   Alcohol none   POA/Living Will       FAMILY HISTORY:   Family Status  Relation Name Status  . Mother  Deceased       alzheimers, stroke  . Son Audry Pili Deceased       multiple myeloma  . Son Octavia Bruckner Deceased       pancreatic cancer  . Father  Deceased       unknown  . Brother [employer]  . Brother Shanon Brow Deceased  . Sister Dentist  . Sister Herold Harms  . Daughter [employer]  . Daughter  Deceased       decapitated in [place]  . Mat Uncle  (Not Specified)  . Neg Hx  (Not Specified)    ROS:   A complete 10 system review of systems was obtained and was unremarkable apart from what is mentioned  above.  PHYSICAL EXAMINATION:    VITALS:   Vitals:   05/09/17 0810  BP: (!) 146/84  Pulse: 80  Weight: 133 lb (60.3 kg)  Height: 5' (1.524 m)    GEN:  The patient appears stated age and is in NAD. HEENT:  Normocephalic, atraumatic.  The mucous membranes are moist. The superficial temporal arteries are without ropiness or tenderness. CV:  RRR with 3/6 SEM Lungs:  CTAB Neck/HEME:  There are no carotid bruits bilaterally.  Neurological examination:  Orientation: Had trouble with trail making.  Did well with orientation.  Pt is able to accurately give me her history Montreal Cognitive Assessment  11/19/2016 12/13/2014  Visuospatial/ Executive (0/5) 1 3  Naming (0/3) 2 2  Attention: Read list of digits (0/2) 2 2  Attention: Read list of letters (0/1) 1 1  Attention: Serial 7 subtraction starting at 100  (0/3) 0 1  Language: Repeat phrase (0/2) 2 2  Language : Fluency (0/1) 0 1  Abstraction (0/2) 2 2  Delayed Recall (0/5) 0 0  Orientation (0/6) 6 6  Total 16 20  Adjusted Score (based on education) 16 21    Cranial nerves: There is good facial symmetry.  Extraocular muscles are intact. The visual fields are full to confrontational testing. The speech is fluent and clear. Soft palate rises symmetrically and there is no tongue deviation. Hearing is intact to conversational tone. Sensation: Sensation is intact to light touch throughout Motor: Strength is 5/5 in the bilateral upper and lower extremities.  No asymmetries noted.   Shoulder shrug is equal and symmetric.  There is no pronator drift.  Movement examination: Tone: There is normal tone bilaterally Abnormal movements: None Coordination:  There is decremation with finger taps, toe taps bilaterally and with heel taps on the L Gait and Station: The patient pushes off of the chair to get off.  Ambulates with rollator.  She has start hesitation.  She initially shuffles but then once reminded she takes bigger steps and walks well.  She turns en bloc.    Labs:  Lab Results  Component Value Date   DQQIWLNL89 211 08/11/2014   No results found for: HGBA1C  Lab Results  Component Value Date   WBC 5.4 12/04/2016   HGB 14.0 12/04/2016   HCT 41.4 12/04/2016   MCV 95.1 12/04/2016   PLT 144.0 (L) 12/04/2016     Chemistry      Component Value Date/Time   NA 143 12/04/2016 0958   NA 137 08/03/2015 0524   K 3.8 12/04/2016 0958   CL 104 12/04/2016 0958   CO2 30 12/04/2016 0958   BUN 21 12/04/2016 0958   BUN 13 08/03/2015 0524   CREATININE 0.93 12/04/2016 0958   GLU 81 08/03/2015 0524      Component Value Date/Time   CALCIUM 9.5 12/04/2016 0958   ALKPHOS 39 12/04/2016 0958   AST 20 12/04/2016 0958   ALT 3 12/04/2016 0958   BILITOT 0.9 12/04/2016 0958     Lab Results  Component Value Date   TSH 3.78 12/04/2016       ASSESSMENT/PLAN:  1.  Parkinson's disease  -dx formally in 08/2015  -We discussed the diagnosis as well as pathophysiology of the disease.  We discussed treatment options as well as prognostic indicators.  Patient education was provided.  -continue carbidopa/levodopa 25/100 tid .  Higher dosage may have caused hallucinations but may need to consider trying going up again to see if would help freezing.  I am not sure if it would.  Discussed with patient and daughter today.    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  She has very significant small vessel disease.  We talked about the importance of maintaining a regular daily schedule, along with regular daily activities, which would include exercise.  Talk to her about getting a white board to help her maintain schedule.  Talk to her about scheduling naps if she is going to take them.  Talked to them about aceylcholinesterase inhib as this (memory change) is very bothersome to daughter.  Pt doesn't want more medication and her daughter agrees.  2.  Melanoma  -discussed with the patient that PD increases risk of melanoma and sees [facility] dermatology.  It has been about a year since last appointment and encouraged pt/daughter to make appointment.    3.  Constipation  -doing much better and managing with prune juice, mag citrate and applesauce  4.  Follow up is anticipated in the next few months, sooner should new neurologic issues arise.  Much greater than 50% of this visit was spent in counseling and coordinating care.  Total face to face time:  30 min

## 2017-05-09 ENCOUNTER — Encounter: Payer: Self-pay | Admitting: Neurology

## 2017-05-09 ENCOUNTER — Ambulatory Visit (INDEPENDENT_AMBULATORY_CARE_PROVIDER_SITE_OTHER): Payer: Medicare Other | Admitting: Neurology

## 2017-05-09 ENCOUNTER — Encounter: Payer: Self-pay | Admitting: Psychology

## 2017-05-09 VITALS — BP 146/84 | HR 80 | Ht 60.0 in | Wt 133.0 lb

## 2017-05-09 DIAGNOSIS — F0280 Dementia in other diseases classified elsewhere without behavioral disturbance: Secondary | ICD-10-CM | POA: Diagnosis not present

## 2017-05-09 DIAGNOSIS — G20 Parkinson's disease: Secondary | ICD-10-CM | POA: Diagnosis not present

## 2017-05-09 NOTE — Progress Notes (Signed)
I met with patient and her daughter today while they were in the clinic. We talked a little about participating in activities at [facility] and using the social work services there with Katy Fitch. The patient was interactive in this conversation. The caregiver had minimal participation in the discussion.

## 2017-05-22 ENCOUNTER — Ambulatory Visit: Payer: BLUE CROSS/BLUE SHIELD | Admitting: Neurology

## 2017-05-23 ENCOUNTER — Telehealth: Payer: Self-pay | Admitting: *Deleted

## 2017-05-23 NOTE — Telephone Encounter (Signed)
Received Physician Orders from [facility] for TSH; forwarded to provider/SLS 12/28

## 2017-05-24 DIAGNOSIS — E039 Hypothyroidism, unspecified: Secondary | ICD-10-CM | POA: Diagnosis not present

## 2017-05-24 DIAGNOSIS — E038 Other specified hypothyroidism: Secondary | ICD-10-CM | POA: Diagnosis not present

## 2017-05-24 DIAGNOSIS — E034 Atrophy of thyroid (acquired): Secondary | ICD-10-CM | POA: Diagnosis not present

## 2017-05-26 ENCOUNTER — Telehealth: Payer: Self-pay | Admitting: *Deleted

## 2017-05-26 ENCOUNTER — Other Ambulatory Visit: Payer: Self-pay | Admitting: Family Medicine

## 2017-05-26 DIAGNOSIS — E039 Hypothyroidism, unspecified: Secondary | ICD-10-CM

## 2017-05-26 NOTE — Telephone Encounter (Signed)
Received TSH results from [facility]; forwarded to provider/SLS 12/31

## 2017-05-27 DIAGNOSIS — E038 Other specified hypothyroidism: Secondary | ICD-10-CM | POA: Diagnosis not present

## 2017-05-27 DIAGNOSIS — E034 Atrophy of thyroid (acquired): Secondary | ICD-10-CM | POA: Diagnosis not present

## 2017-05-28 ENCOUNTER — Telehealth: Payer: Self-pay | Admitting: *Deleted

## 2017-05-28 NOTE — Telephone Encounter (Signed)
Received Lab Report results from [facility] on Free T4; forwarded to provider/SLS 01/02

## 2017-06-04 DIAGNOSIS — Z85820 Personal history of malignant melanoma of skin: Secondary | ICD-10-CM | POA: Diagnosis not present

## 2017-06-04 DIAGNOSIS — L0100 Impetigo, unspecified: Secondary | ICD-10-CM | POA: Diagnosis not present

## 2017-06-09 ENCOUNTER — Telehealth: Payer: Self-pay | Admitting: *Deleted

## 2017-06-09 NOTE — Telephone Encounter (Signed)
Received Physician Orders via mail from [place]; forwarded to provider/SLS 01/14

## 2017-06-10 DIAGNOSIS — L0100 Impetigo, unspecified: Secondary | ICD-10-CM | POA: Diagnosis not present

## 2017-06-10 DIAGNOSIS — Z85828 Personal history of other malignant neoplasm of skin: Secondary | ICD-10-CM | POA: Diagnosis not present

## 2017-06-10 DIAGNOSIS — Z85820 Personal history of malignant melanoma of skin: Secondary | ICD-10-CM | POA: Diagnosis not present

## 2017-06-15 IMAGING — CT CT ABD-PELV W/ CM
2 of 5 series · 15 of 46 positions shown, 17 images · IV contrast (APPLIED)
Comparison: No priors.

CLINICAL DATA: 84-year-old female with history of constipation,
abdominal distention and discomfort for the past 2 days. Last bowel
movement was on 01/22/2016.

EXAM:
CT ABDOMEN AND PELVIS WITH CONTRAST
TECHNIQUE: Multidetector CT imaging of the abdomen and pelvis was performed
using the standard protocol following bolus administration of
intravenous contrast.
CONTRAST:  100mL SF6O2R-HOO IOPAMIDOL (SF6O2R-HOO) INJECTION 61%

[Series 2: axial st · axial · 0.82mm/px · z∈[-382,-36]mm · 12 of 79 slices shown, 14 images]
[im 5/79  soft-tissue]
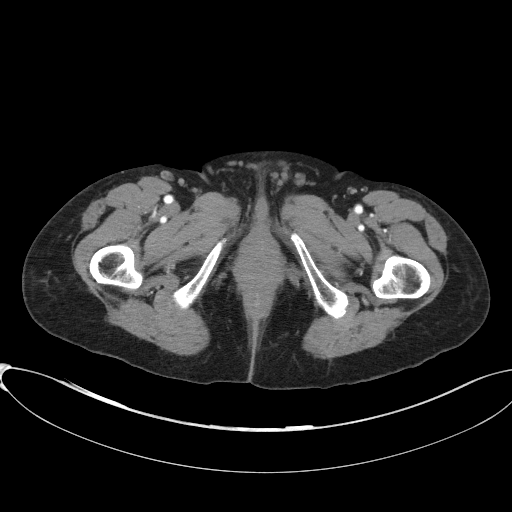
[im 5/79  bone]
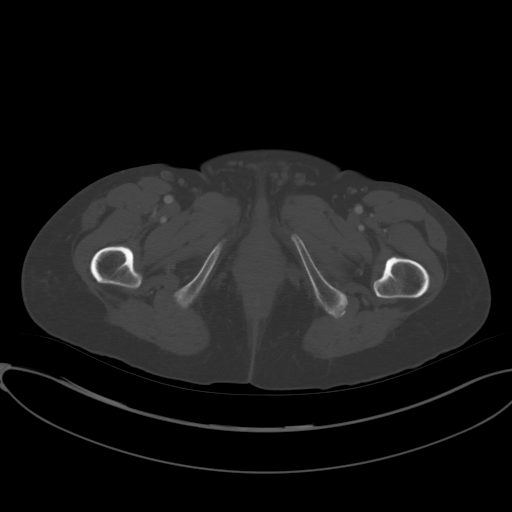
[im 13/79  soft-tissue]
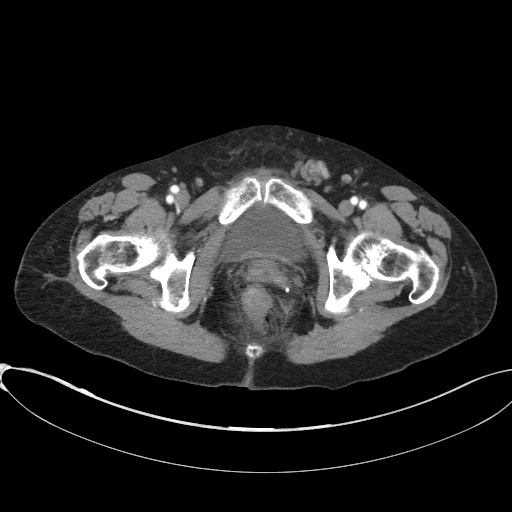
[im 17/79  soft-tissue]
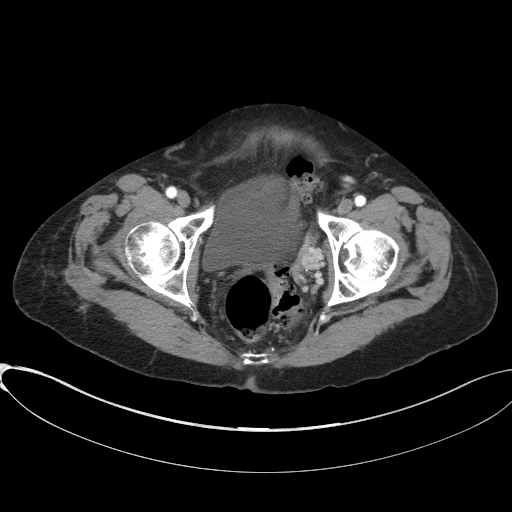
[im 25/79  soft-tissue]
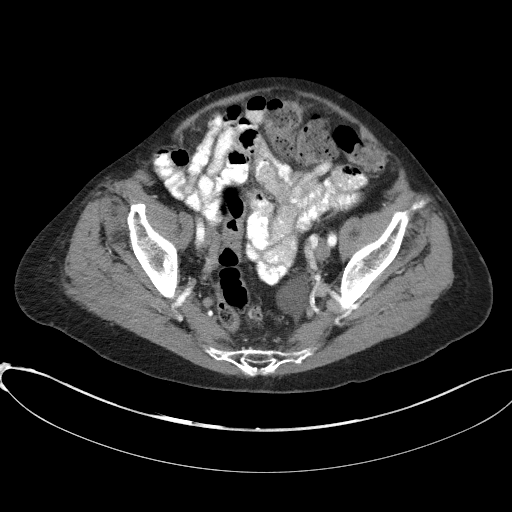
[im 29/79  soft-tissue]
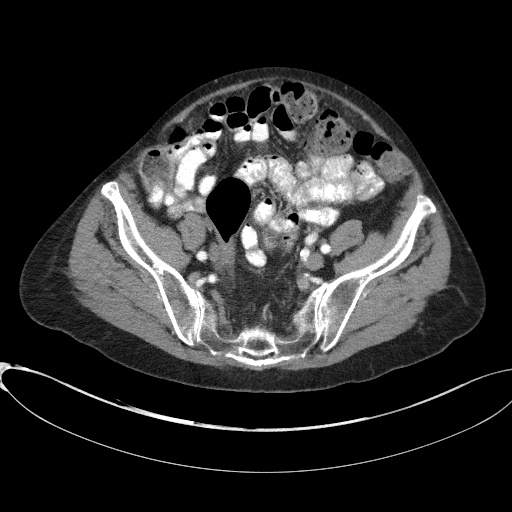
[im 37/79  soft-tissue]
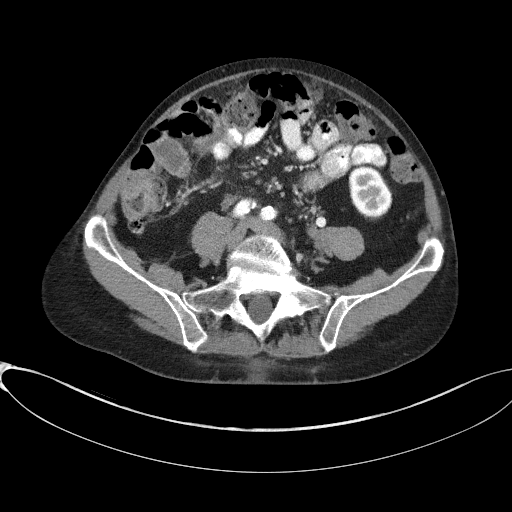
[im 42/79  soft-tissue]
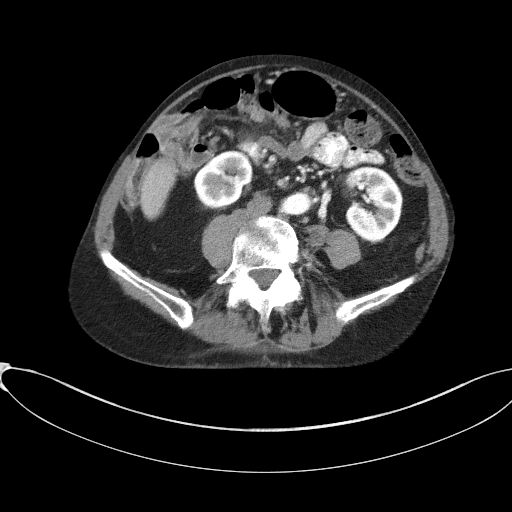
[im 50/79  soft-tissue]
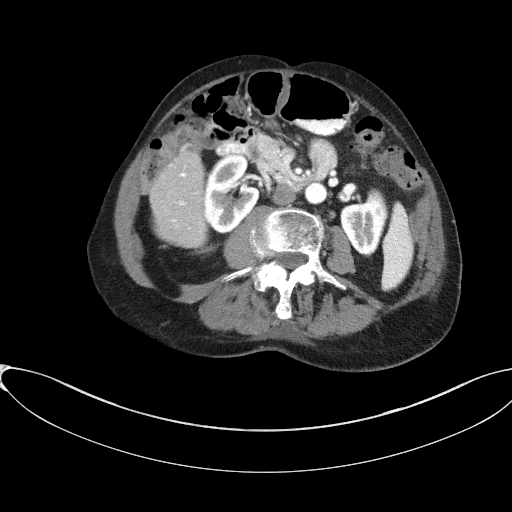
[im 54/79  soft-tissue]
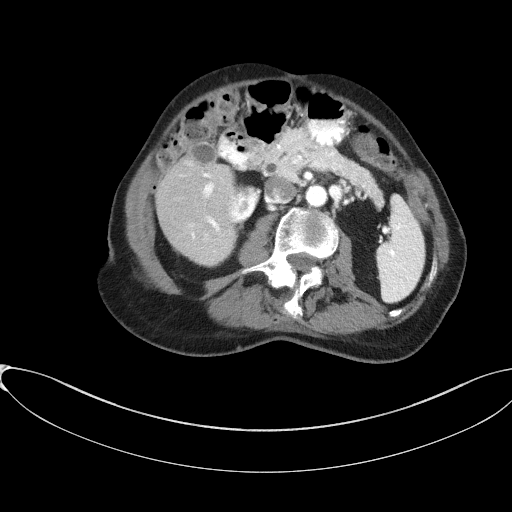
[im 54/79  bone]
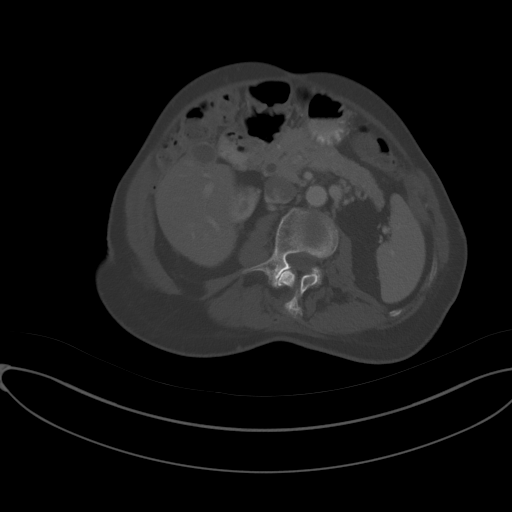
[im 62/79  soft-tissue]
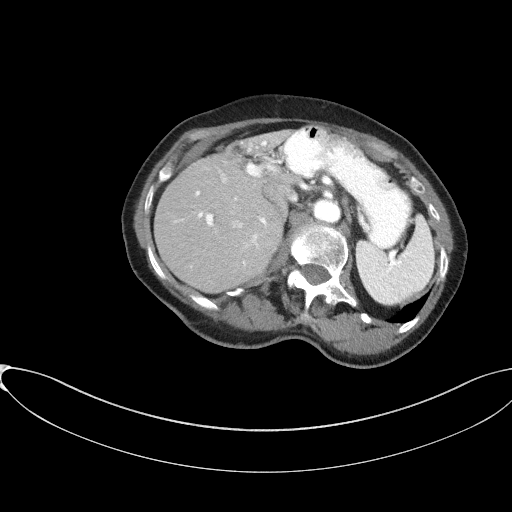
[im 66/79  soft-tissue]
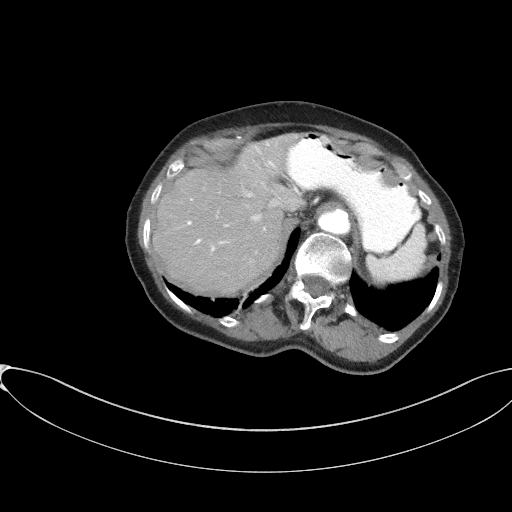
[im 74/79  soft-tissue]
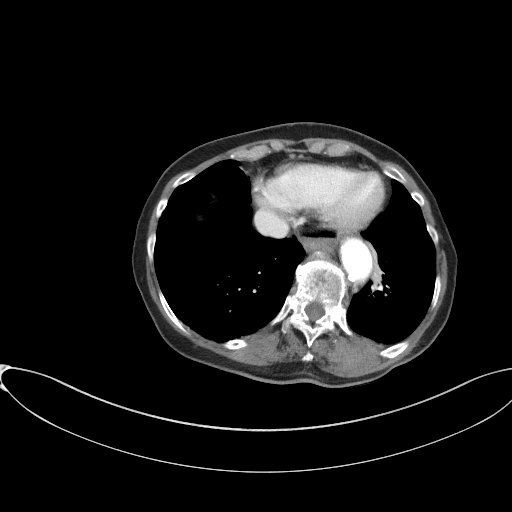

[Series 5: coronal st · coronal · 0.72mm/px · 3 of 96 slices shown]
[im 32/96  soft-tissue]
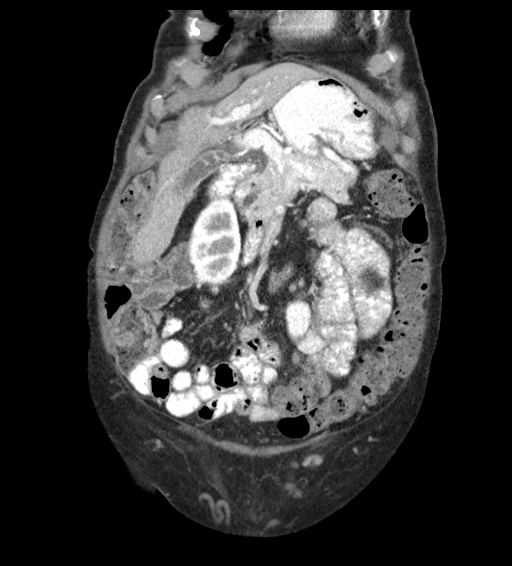
[im 43/96  soft-tissue]
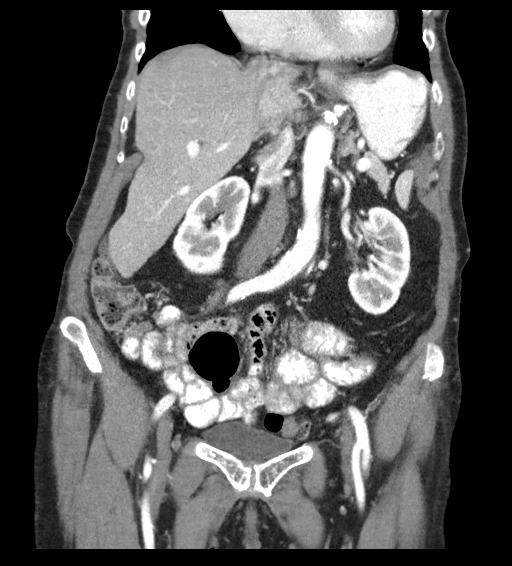
[im 53/96  soft-tissue]
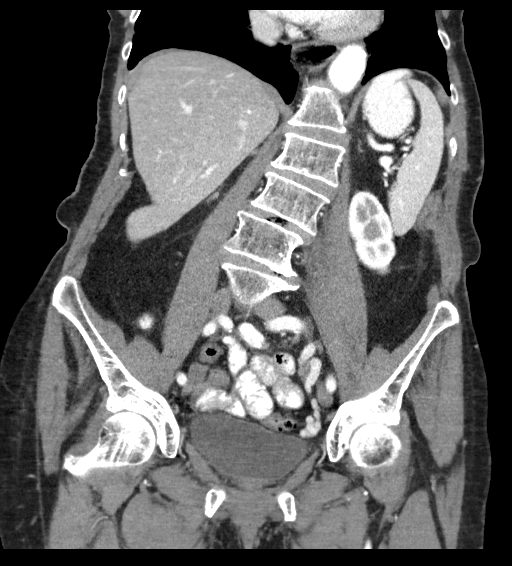

[15 of 46 positions shown; findings below may reference images not displayed]

FINDINGS: Lower chest: Mild scarring in the visualize lung bases bilaterally.
Small hiatal hernia.

Hepatobiliary: No cystic or solid hepatic lesions. No intra or
extrahepatic biliary ductal dilatation. Gallbladder is unremarkable
in appearance.

Pancreas: No pancreatic mass. No pancreatic ductal dilatation. No
pancreatic or peripancreatic fluid or inflammatory changes.

Spleen: Unremarkable.

Adrenals/Urinary Tract: Bilateral adrenal glands and the right
kidney are normal in appearance. Sub cm low-attenuation lesion in
the lower pole of the left kidney is too small to definitively
characterize, but is statistically likely a tiny cyst. No
hydroureteronephrosis. Urinary bladder is normal in appearance.

Stomach/Bowel: Stomach is unremarkable in appearance. No pathologic
dilatation of small bowel or colon. Numerous colonic diverticulae
are noted, particularly in the sigmoid colon, without surrounding
inflammatory changes to suggest an acute diverticulitis at this
time. Stool burden does not appear excessive. Normal appendix.

Vascular/Lymphatic: Aortic atherosclerosis, without evidence of
aneurysm in the abdominal or pelvic vasculature. No lymphadenopathy
noted in the abdomen or pelvis.

Reproductive: Status post hysterectomy. Right ovary is atrophic. In
the left ovary there is a 4.2 x 3.5 x 4.0 cm low-attenuation lesion.

Other: No significant volume of ascites.  No pneumoperitoneum.

Musculoskeletal: Levoscoliosis of the thoracolumbar spine. There are
no aggressive appearing lytic or blastic lesions noted in the
visualized portions of the skeleton.
IMPRESSION: 1. No acute findings in the abdomen or pelvis.
2. Colonic diverticulosis without evidence of acute diverticulitis
at this time.
3. Normal appendix.
4. **An incidental finding of potential clinical significance has
been found. 4.2 x 3.5 x 4.0 cm low-attenuation lesion in the left
ovary, presumably a cyst. Further evaluation with nonemergent pelvic
ultrasound is recommended in the near future for definitive
characterization of this lesion in this postmenopausal female. This
recommendation follows ACR consensus guidelines: White Paper of the
ACR Incidental Findings Committee II on Adnexal Findings. [HOSPITAL] [DATE].**
5. Aortic atherosclerosis.
6. Additional incidental findings, as above.

## 2017-06-26 ENCOUNTER — Telehealth: Payer: Self-pay | Admitting: *Deleted

## 2017-06-26 NOTE — Telephone Encounter (Signed)
Received Physician Orders from [facility] via mail; forwarded to provider/SLS 01/31

## 2017-07-24 ENCOUNTER — Telehealth: Payer: Self-pay | Admitting: *Deleted

## 2017-07-24 NOTE — Telephone Encounter (Signed)
Received Physician Orders from [facility]; forwarded to provider/SLS 02/28

## 2017-07-24 NOTE — Telephone Encounter (Signed)
Received Physician Standing Orders from [facility]; forwarded to provider/SLS 02/28

## 2017-08-11 ENCOUNTER — Telehealth: Payer: Self-pay | Admitting: *Deleted

## 2017-08-11 NOTE — Telephone Encounter (Signed)
Received Physician Orders from [facility] for Rx pt's neuropathy in feet & toes; forwarded to provider/SLS 03/18

## 2017-09-05 ENCOUNTER — Telehealth: Payer: Self-pay | Admitting: *Deleted

## 2017-09-05 NOTE — Telephone Encounter (Signed)
Received Physician Orders from [facility]; forwarded to provider/SLS 04/12

## 2017-09-09 DIAGNOSIS — G20 Parkinson's disease: Secondary | ICD-10-CM | POA: Diagnosis not present

## 2017-09-09 DIAGNOSIS — E034 Atrophy of thyroid (acquired): Secondary | ICD-10-CM | POA: Diagnosis not present

## 2017-09-09 DIAGNOSIS — G3184 Mild cognitive impairment, so stated: Secondary | ICD-10-CM | POA: Diagnosis not present

## 2017-09-09 DIAGNOSIS — E038 Other specified hypothyroidism: Secondary | ICD-10-CM | POA: Diagnosis not present

## 2017-09-18 DIAGNOSIS — L603 Nail dystrophy: Secondary | ICD-10-CM | POA: Diagnosis not present

## 2017-09-18 DIAGNOSIS — L600 Ingrowing nail: Secondary | ICD-10-CM | POA: Diagnosis not present

## 2017-09-18 DIAGNOSIS — L84 Corns and callosities: Secondary | ICD-10-CM | POA: Diagnosis not present

## 2017-09-23 ENCOUNTER — Telehealth: Payer: Self-pay | Admitting: *Deleted

## 2017-09-23 NOTE — Telephone Encounter (Signed)
Received Physician Orders from [facility]; forwarded to provider/SLS 04/30

## 2017-09-29 ENCOUNTER — Telehealth: Payer: Self-pay | Admitting: *Deleted

## 2017-09-29 NOTE — Telephone Encounter (Signed)
Received Physician Orders from [facility]; forwarded to provider/SLS 05/06

## 2017-10-02 ENCOUNTER — Telehealth: Payer: Self-pay | Admitting: *Deleted

## 2017-10-02 NOTE — Telephone Encounter (Signed)
Received Physician Orders via mail from [facility]; forwarded to provider/SLS 05/09

## 2017-10-13 ENCOUNTER — Telehealth: Payer: Self-pay | Admitting: *Deleted

## 2017-10-13 NOTE — Telephone Encounter (Signed)
Received Physician Orders from [facility]; forwarded to covering provider/SLS 05/20

## 2017-10-13 NOTE — Telephone Encounter (Signed)
Signed for by Dr. Etter Sjogren; faxed with confirmation/SLS 05/20

## 2017-10-15 DIAGNOSIS — L600 Ingrowing nail: Secondary | ICD-10-CM | POA: Diagnosis not present

## 2017-10-15 DIAGNOSIS — M79675 Pain in left toe(s): Secondary | ICD-10-CM | POA: Diagnosis not present

## 2017-10-28 DIAGNOSIS — L600 Ingrowing nail: Secondary | ICD-10-CM | POA: Diagnosis not present

## 2017-10-29 ENCOUNTER — Telehealth: Payer: Self-pay | Admitting: *Deleted

## 2017-10-29 NOTE — Telephone Encounter (Signed)
Received Physician Orders from [facility]; forwarded to provider/SLS 06/05

## 2017-11-04 ENCOUNTER — Telehealth: Payer: Self-pay | Admitting: *Deleted

## 2017-11-04 NOTE — Telephone Encounter (Signed)
Received Physician Orders with MMSE from [place]; forwarded to provider/SLS 06/11

## 2017-11-06 ENCOUNTER — Encounter: Payer: Self-pay | Admitting: Family Medicine

## 2017-11-06 ENCOUNTER — Telehealth: Payer: Self-pay | Admitting: Family Medicine

## 2017-11-06 NOTE — Telephone Encounter (Signed)
Copied from [place] [phone]. Topic: Quick Communication - See Telephone Encounter >> Nov 06, 2017  3:59 PM Boyd Kerbs wrote: CRM for notification. See Telephone encounter for: 11/06/17.  [name], daughter, wanting Verbal  order for 10 mg of Lasiks for tomorrow - 6/14   PRN for swelling    [facility] [phone]

## 2017-11-06 NOTE — Telephone Encounter (Signed)
OK. I would like her to take 10 mEq of potassium (klor con) with each dose as well. TY.

## 2017-11-07 NOTE — Telephone Encounter (Signed)
Spoke to Production assistant, radio at [facility] and gave verbal order/they will fax over paper work for PCP to sign

## 2017-11-07 NOTE — Telephone Encounter (Signed)
Called [facility] left message to call back.

## 2017-11-10 ENCOUNTER — Telehealth: Payer: Self-pay | Admitting: Neurology

## 2017-11-10 ENCOUNTER — Telehealth: Payer: Self-pay | Admitting: *Deleted

## 2017-11-10 NOTE — Telephone Encounter (Signed)
Patient daughter called and needs to speak to someone about her mother and what is going on with patient. She had to cancel appt on 11-13-17 due to patient having surgery on her foot a couple of weeks ago and the foot getting infection in it. She needs to be seen before OCt per daughter (which is the first thing [facility] has open ). Please call

## 2017-11-10 NOTE — Telephone Encounter (Signed)
Received Adult [facility] Physician Authorization and Care Plan; forwarded to provider/SLS 06/17

## 2017-11-10 NOTE — Telephone Encounter (Signed)
Left message on machine for patient's daughter to call back. She will likely just need put on the cancellation list.

## 2017-11-10 NOTE — Telephone Encounter (Signed)
Spoke with daughter.   She states patient is not doing well, but they can not make appt on Thursday. She wanted her to see another doctor in our practice, but I advised our doctors do not do this. I offered to refer her to another practice if that is what they wanted. They declined.  I added her to the cancellation list and made an appt for October.

## 2017-11-11 DIAGNOSIS — Z85820 Personal history of malignant melanoma of skin: Secondary | ICD-10-CM | POA: Diagnosis not present

## 2017-11-11 DIAGNOSIS — L814 Other melanin hyperpigmentation: Secondary | ICD-10-CM | POA: Diagnosis not present

## 2017-11-11 DIAGNOSIS — L03019 Cellulitis of unspecified finger: Secondary | ICD-10-CM | POA: Diagnosis not present

## 2017-11-13 ENCOUNTER — Telehealth: Payer: Self-pay | Admitting: *Deleted

## 2017-11-13 ENCOUNTER — Ambulatory Visit: Payer: Medicare Other | Admitting: Neurology

## 2017-11-13 NOTE — Telephone Encounter (Signed)
Received Physician Orders from [facility]; forwarded to provider/SLS 06/19

## 2017-11-13 NOTE — Progress Notes (Signed)
Natalie Lam was seen today in the movement disorders clinic for neurologic consultation at the request of Shelda Pal, DO.  The patient is accompanied by her daughter who supplements the history.  Prior records made available to me were reviewed.  The patient has a history of gait instability.  Pts sx's started in March, 2016.  Pt thinks that it started quickly.  She remembers one particular day in March that she went to [facility] and she backed into the space and she realized that she tried to walk into [employer] and just felt that she would fall if she didn't get back in the car.  She had a near syncopal episode which was later determined due to HTN and mild AS.  After that, she seemed to go slowly down hill.  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  She had PT in May which helped her back but didn't really seem to help the gait.  Pt is getting ready to move to [facility] assisted living.  Pt states that she just has a "fear of falling because so many people my age do."  Admits that depression contributes to sx's but states that doing better than she was.  Pt denies lateralizing weakness/paresthesias.  Denies speech change but states that she "tires easily."  Denies feet paresthesias.      Specific Symptoms:  Tremor: No. Voice: denies hypophonic speech Sleep: sleeps well  Vivid Dreams:  Yes.   (only if eats at night)  Acting out dreams:  No. Wet Pillows: No. Postural symptoms:  Yes.   (no cane/walker to ambulate)  Falls?  No. Bradykinesia symptoms: slow movements Loss of smell:  No. Loss of taste:  No. Urinary Incontinence:  No. Difficulty Swallowing:  No. Handwriting, micrographia: No. (perhaps a little smaller) Trouble with ADL's:  No.  Trouble buttoning clothing: No. Depression:  Yes.   (multiple deaths in family) Memory changes:  Yes.   (but  complicated by deaths, moving, closing family business; still drives in familiar territory and parks far away to avoid other cars; pays monthly bills without issue; cooks for self until moves to [facility]) Hallucinations:  No.  visual distortions: No. N/V:  No. Lightheaded:  No.   Neuroimaging has previously been performed.  It is  available for my review today.  A CT of the brain was performed on 08/12/2014 after near syncopal episode.  I reviewed this.  There is atrophy and rather significant cervical small vessel disease.  A carotid ultrasound was performed on 09/06/2014.  This demonstrated 1-49% stenosis bilaterally.  09/13/15 update:  The patient is following up today, accompanied by her daughter who supplements the history.  I have not seen her since July, 2016 and that was only a one time visit.  She does have a history of peripheral neuropathy, which has led to gait instability.  Her peripheral neuropathy has also caused paresthesias in the feet and according to records she was started on gabapentin by the nurse practitioner at [facility] in October, 2016.  She states that she is f/u due to trouble walking for about 6 weeks.  Her daughter is a Marine scientist and agrees that the majority of the issue started about 6 weeks ago, with no new med changes.  Pt states that she is getting stuck in doorways.  "Once I get going, I am okay."  11/24/15 update:  Pt  is f/u today re: newly dx PD last visit. This patient is accompanied in the office by her child who supplements the history.  Reviewed records since last visit.  On levodopa tid now.  Tolerating it well.  Feels that she is doing much better.  States that she feels stronger in the legs and "I don't have to work to get across the doorway."  No hallucinations.  No lightheaded/near syncope.  No falls since last visit.  Did PT/OT at [facility] and feels it helped.  Uses rollator.    02/26/16 update:  The patient follows up regarding her Parkinson's disease.  She  remains on carbidopa/levodopa 25/100, one tablet 3 times per day.  She reports that she has been doing well.  No falls.  No lightheadedness or near syncope.  She is using her Rollator faithfully but "with my wheels I can travel."  She did go to the emergency room with abdominal pain, which was ultimately felt due to constipation.  She is taking MiraLAX but doesn't use it daily.  She is still having trouble balancing constipation with diarrhea.  Daughter that Baker Pierini was started almost a month ago.  She is on 13mg once per day.  Because of intermittent diarrhea, she has cut back on exercise some.  Lives in assisted living and gets assist with medication, bath, dressing (putting on hose especially).  Meals prepared.  She is at [facility]    06/20/16 update:  Patient follows up today, on carbidopa/levodopa 25/100, 1 tablet 3 times a day.  She is accompanied by her granddaughter who supplements the history.  [facility] since our last visit.  When she moved, her levodopa was decreased from 1-1/2 tablets to 1 tablet 3 times per day.  Her daughter ended up calling me about this, and I left it at that dose because her daughter also stated that she was having hallucinations.  It was unclear if these were actual hallucinations, or if these were arising out of sleep.  She followed up with her primary care physician.  A urinalysis was unremarkable.  Denies any hallucination or visual distortions today.  Daughter states having paranoia.  Not sleeping well because she worries that there are things going on outside her room (pt denies this but daughter relates that).  daughter relates that if door closes she becomes hyperfocused on why it closed.  Pt relates that it is much better now as she has gotten used to the noises of the new facility.  Working with PT.  Daughter concerned that PT goal is to get her off of the walker.  11/19/16 update:  Patient seen today in follow-up.  Patient is  accompanied by her daughter who supplements the history.  She remains on carbidopa/levodopa 25/100, one tablet 3 times per day.  The records that were made available to me were reviewed.  [facility]  Daughter states that she is in enhanced assisted living, costing $11,000-$12,000 per month.  She is getting assist with meds, bathing, dressing, meals but daughter doesn't think that she needs the enhanced part of assisted living.  Her daughter states that the only difference between regular and enhanced assisted living is the CNA level (1:12 vs 1:4-6 patients).  No falls except states that she slid off toilet once when trying to put on panties.      12/09/16 update:  Patient seen as a work in today.  Records were reviewed.  Accompanied by  daughter who supplements the hx.   Last visit, her daughter had asked me to write a letter stating that she no longer needed in enhanced assisted living, because the patient was doing so well.  However, that is not the case.  The patient just went to the emergency room 2 days ago with strange behavior.  She had apparently disassembled a collage picture frame and placed Post-it notes over the wall stating "do not post on facebook."  Pt states today that she took down a photo frame to put up new photos and put up post up notes stating where she would want new photos to go.  Denies that the post its had anything on them. Pts daughter state that agitation/lack of sleep/insomnia happened the night before.  she has had some day/night reversal.   There were no hallucinations.  She had no urinary tract infection and was noted to have no abnormal behavior in the emergency room.  Her daughter told the emergency room physician that strange behavior have been going on for 2 weeks and she states today that perhaps it has been going on for a week.  Pts blood pressure has been intermittently quite elevated over the last few weeks (2 episodes per daughter).  Her  CT of the brain in the emergency room was nonacute.  No labs were done in the emergency room except a urinalysis.  Daughter states that pt has "lost about half of ability to walk" but unsure why.  She is weak.  Seems mostly due to fatigue.  She has PT ordered but not yet started.     05/09/17 update: Patient is seen in follow-up today, accompanied by her daughter who supplements the history.  She had an MRI of the brain in July, after our last visit.  I have reviewed that MRI of the brain.  There was severe small vessel disease.  There is significant atrophy.  There is evidence of chronic microhemorrhages.  There was nothing acute.  She is still in physical therapy and she reports that she is doing well.  "It makes you feel better when you do it."   She remains on carbidopa/levodopa 25/100, 1 tablet 3 times per day.  She denies falls and then tells me one time "she swirled to the floor" and one time "she slid off of the commode."  Her daughter describes memory changes - "her cognitive decline is significant."  Discussed adding melatonin last visit and pt denies she is on it but daughter states that she did try it but she didn't want it any longer.  Pt states that she now remembers that "it was running her BP up."  She is sleeping well and "I need my sleep and my exercise."  No hallucinations.  She denies confusion.  States that she "stays busy" at [facility] but she cannot tell me what she does.  11/14/17 update:  Pt seen in f/u for PD.  This patient is accompanied in the office by her daughter who supplements the history.  Pt is on carbidopa/levodopa 25/100 tid.  No falls.  "I keep busy doing something all the time."  Can't tell me specifics.  Goes to meals at the dining room and "the food is delicious."  No lightheadedness.  "I am really good and Lavella Lemons (daughter) can tell you too."  Daughter brings a list of complaints including "confabulation, delusions of persecution."   Daughter complains that pt needs  lift chair but won't use.  Pt asks me about  leaving [facility] and living completely independently, "just occasionally."  Haven't done PT in a long time but daughter states that [facility] won't do it without her being present.  Daughter states that staff at [facility] don't see/understand the things that the daughter sees.  They just had a care plan meeting last week.   ALLERGIES:   Allergies  Allergen Reactions  . Sulfonamide Derivatives     REACTION:generalized  swelling  . Sulfa Antibiotics Swelling    CURRENT MEDICATIONS:  Outpatient Encounter Medications as of 11/14/2017  Medication Sig  . acetaminophen (TYLENOL) 325 MG tablet Take 2 tablets (650 mg total) by mouth at bedtime as needed for mild pain.  Marland Kitchen amLODipine (NORVASC) 5 MG tablet Take 5 mg by mouth daily.  . Calcium Carbonate-Vitamin D (CALCIUM 600/VITAMIN D PO) Take 1 tablet by mouth daily.   . capsaicin (ZOSTRIX) 0.025 % cream Apply topically 2 (two) times daily.  . carbidopa-levodopa (SINEMET IR) 25-100 MG tablet 2 in the morning, 1 in the afternoon, 1 in the evening  . Cholecalciferol (VITAMIN D) 2000 UNITS CAPS Take 1 capsule (2,000 Units total) by mouth daily.  . furosemide (LASIX) 20 MG tablet Take 20 mg by mouth as needed.  Marland Kitchen levothyroxine (SYNTHROID, LEVOTHROID) 50 MCG tablet Take one on Sunday, Monday, Wednesday, and Friday 75 mcg other days  . magnesium citrate SOLN Take 1 Bottle by mouth daily as needed.   . Multiple Vitamin (MULTIVITAMIN) capsule Take 1 capsule by mouth daily.    Marland Kitchen omeprazole (PRILOSEC) 20 MG capsule Take 20 mg by mouth daily.   . polyvinyl alcohol (LIQUIFILM TEARS) 1.4 % ophthalmic solution 1 drop as needed for dry eyes.  . potassium chloride (K-DUR) 10 MEQ tablet Use when taking Lasix  . [DISCONTINUED] carbidopa-levodopa (SINEMET IR) 25-100 MG tablet Take 1 tablet by mouth 3 (three) times daily.   . [DISCONTINUED] levothyroxine (SYNTHROID, LEVOTHROID) 50 MCG tablet Take 50 mcg by mouth daily  before breakfast.   No facility-administered encounter medications on file as of 11/14/2017.     PAST MEDICAL HISTORY:   Past Medical History:  Diagnosis Date  . Aortic stenosis, mild   . Constipation, chronic   . Gilbert syndrome   . HTN (hypertension)   . Hyperlipidemia   . Hypothyroidism   . Melanoma ([place])   . Neuropathy   . Osteoporosis   . Parkinson's disease ([place])   . Peripheral neuropathy    Bilateral  . Rheumatic fever    does not take Flu shot     PAST SURGICAL HISTORY:   Past Surgical History:  Procedure Laterality Date  . ABDOMINAL HYSTERECTOMY  1963   no BSO; no abnormal PAPs  . COLONOSCOPY  2000   negative; [place]  . [place]  . MELANOMA EXCISION  08/2014    SOCIAL HISTORY:   Social History   Socioeconomic History  . Marital status: Widowed    Spouse name: Not on file  . Number of children: Not on file  . Years of education: Not on file  . Highest education level: Not on file  Occupational History  . Occupation: retired own [employer]  . Financial resource strain: Not on file  . Food insecurity:    Worry: Not on file    Inability: Not on file  . Transportation needs:    Medical: Not on file    Non-medical: Not on file  Tobacco Use  . Smoking status: Never Smoker  . Smokeless  tobacco: Never Used  Substance and Sexual Activity  . Alcohol use: No  . Drug use: No  . Sexual activity: Not on file  Lifestyle  . Physical activity:    Days per week: Not on file    Minutes per session: Not on file  . Stress: Not on file  Relationships  . Social connections:    Talks on phone: Not on file    Gets together: Not on file    Attends religious service: Not on file    Active member of club or organization: Not on file    Attends meetings of clubs or organizations: Not on file    Relationship status: Not on file  . Intimate partner violence:    Fear of current or ex partner: Not on file    Emotionally  abused: Not on file    Physically abused: Not on file    Forced sexual activity: Not on file  Other Topics Concern  . Not on file  Social History Narrative   Lives at [facility] 01/05/2015   Widowed   Never smoked   Exercise walking   Alcohol none   POA/Living Will       FAMILY HISTORY:   Family Status  Relation Name Status  . Mother  Deceased       alzheimers, stroke  . Son Audry Pili Deceased       multiple myeloma  . Son Octavia Bruckner Deceased       pancreatic cancer  . Father  Deceased       unknown  . Brother [employer]  . Brother Shanon Brow Deceased  . Sister Dentist  . Sister Herold Harms  . Daughter [employer]  . Daughter  Deceased       decapitated in [place]  . Mat Uncle  (Not Specified)  . Neg Hx  (Not Specified)    ROS:   A complete 10 system review of systems was obtained and was unremarkable apart from what is mentioned above.  PHYSICAL EXAMINATION:    VITALS:   Vitals:   11/14/17 0923  BP: 124/68  Pulse: 72  SpO2: 94%  Weight: 134 lb (60.8 kg)  Height: 5' (1.524 m)    GEN:  The patient appears stated age and is in NAD. HEENT:  Normocephalic, atraumatic.  The mucous membranes are moist. The superficial temporal arteries are without ropiness or tenderness. CV:  RRR with 3/6 SEM Lungs:  CTAB Neck/HEME:  There are no carotid bruits bilaterally.  Neurological examination:  Orientation: Pt is alert and oriented x 3.  She can be repetitive.  She does remember this examiner has twins and she knew the ages of them! Montreal Cognitive Assessment  11/14/2017 11/19/2016 12/13/2014  Visuospatial/ Executive (0/5) '1 1 3  '$ Naming (0/3) '3 2 2  '$ Attention: Read list of digits (0/2) '2 2 2  '$ Attention: Read list of letters (0/1) '1 1 1  '$ Attention: Serial 7 subtraction starting at 100 (0/3) 0 0 1  Language: Repeat phrase (0/2) '2 2 2  '$ Language : Fluency (0/1) 1 0 1  Abstraction (0/2) '1 2 2  '$ Delayed Recall (0/5) 1 0 0  Orientation (0/6) '4 6 6  '$ Total '16 16 20    '$ Adjusted Score (based on education) '16 16 21    '$ Cranial nerves: There is good facial symmetry. The speech is fluent and clear. Soft palate rises symmetrically and there is no tongue deviation. Hearing is intact to conversational tone. Sensation: Sensation is  intact to light touch throughout Motor: Strength is 5/5 in the bilateral upper and lower extremities.   Shoulder shrug is equal and symmetric.  There is no pronator drift.  Movement examination: Tone: There is slight increased tone in the left upper extremity.  Tone elsewhere is normal. Abnormal movements: None Coordination:  There is mild decremation with RAM's, with any form of RAMS, including alternating supination and pronation of the forearm, hand opening and closing, finger taps, heel taps and toe taps on the left Gait and Station: The patient pushes off of the chair to arise.  She initially has some start hesitation.  When she is out in the hallway, she actually walks very well with a walker.  She does turn en bloc.    Movement examination: Tone: There is slight increased tone in the LUE.   Abnormal movements: None Coordination:  There is decremation with finger taps, toe taps bilaterally and with heel taps on the L Gait and Station: The patient pushes off of the chair to get off.  Ambulates with rollator.  She has start hesitation.  She initially shuffles but then once reminded she takes bigger steps and walks well.  She turns en bloc.    Labs:  Lab Results  Component Value Date   [id number] 829 08/11/2014   No results found for: HGBA1C  Lab Results  Component Value Date   WBC 5.4 12/04/2016   HGB 14.0 12/04/2016   HCT 41.4 12/04/2016   MCV 95.1 12/04/2016   PLT 144.0 (L) 12/04/2016     Chemistry      Component Value Date/Time   NA 143 12/04/2016 0958   NA 137 08/03/2015 0524   K 3.8 12/04/2016 0958   CL 104 12/04/2016 0958   CO2 30 12/04/2016 0958   BUN 21 12/04/2016 0958   BUN 13 08/03/2015 0524    CREATININE 0.93 12/04/2016 0958   GLU 81 08/03/2015 0524      Component Value Date/Time   CALCIUM 9.5 12/04/2016 0958   ALKPHOS 39 12/04/2016 0958   AST 20 12/04/2016 0958   ALT 3 12/04/2016 0958   BILITOT 0.9 12/04/2016 0958     Lab Results  Component Value Date   TSH 3.78 12/04/2016      ASSESSMENT/PLAN:  1.  Parkinson's disease with PDD  -dx formally in 08/2015  -We discussed the diagnosis as well as pathophysiology of the disease.  We discussed treatment options as well as prognostic indicators.  Patient education was provided.  -I am just slightly going to increase her carbidopa/levodopa 25/100, so that she is taking 2 tablets in the morning, 1 in the afternoon and 1 in the evening.  I also removed the dosages slightly closer together.  -talked to her about importance of PT and will call [facility] to understand policy and then send referral if able.  -1 of the big challenges in her care is the discrepancy between daughter's complaints and what is seen at her visits.  I had them sign a release so that my social worker could call the social worker at [facility] and see what they are witnessing there.    -don't recommend leaving [facility] and buying home.  Discussed that today as patient asked about that..    -Information on Parkinson's symposium given.  -Talked about day night reversal and staying awake during the day so that sleep comes more easily.  Daughter states that melatonin caused hypertension.  2.  Melanoma  -discussed with the patient that PD  increases risk of melanoma and sees [facility] dermatology.  Needs to make follow-up.  3.  Constipation  -doing much better and managing with prune juice, mag citrate and applesauce  4. Follow up is anticipated in the next few months, sooner should new neurologic issues arise.  Much greater than 50% of this visit was spent in counseling and coordinating care.  Total face to face time:  30 min

## 2017-11-14 ENCOUNTER — Telehealth: Payer: Self-pay | Admitting: Neurology

## 2017-11-14 ENCOUNTER — Ambulatory Visit (INDEPENDENT_AMBULATORY_CARE_PROVIDER_SITE_OTHER): Payer: Medicare Other | Admitting: Neurology

## 2017-11-14 ENCOUNTER — Encounter: Payer: Self-pay | Admitting: Psychology

## 2017-11-14 ENCOUNTER — Encounter: Payer: Self-pay | Admitting: Neurology

## 2017-11-14 VITALS — BP 124/68 | HR 72 | Ht 60.0 in | Wt 134.0 lb

## 2017-11-14 DIAGNOSIS — Z85820 Personal history of malignant melanoma of skin: Secondary | ICD-10-CM

## 2017-11-14 DIAGNOSIS — G20 Parkinson's disease: Secondary | ICD-10-CM | POA: Diagnosis not present

## 2017-11-14 DIAGNOSIS — F0280 Dementia in other diseases classified elsewhere without behavioral disturbance: Secondary | ICD-10-CM | POA: Diagnosis not present

## 2017-11-14 MED ORDER — CARBIDOPA-LEVODOPA 25-100 MG PO TABS: ORAL_TABLET | ORAL | 1 refills | Status: DC

## 2017-11-14 NOTE — Telephone Encounter (Signed)
Please order

## 2017-11-14 NOTE — Telephone Encounter (Signed)
Order faxed to [facility] at number provided.

## 2017-11-14 NOTE — Progress Notes (Signed)
Spoke with Katy Fitch and reviewed patient's current care plan. We requested nursing staff at [facility] to document and mental or behavioral changes that they see in the patient and/or that the patient discloses to staff.

## 2017-11-14 NOTE — Telephone Encounter (Signed)
Spoke with [facility] PT (ph [phone]) they cleared up some misunderstanding re: physical therapy at their facility. They state sometimes patients are private pay and have to pay out of pocket for PT but patient has Medicare and BCBS which is covered 80/20. She also states they prefer that a family member NOT be present during therapy, as they can be distracting to the patient.   She states if we want to order therapy we can fax to them at [phone].   Dr. Carles Collet Juluis Rainier.

## 2017-11-14 NOTE — Patient Instructions (Addendum)
Increase carbidopa/levodopa 25/100, 2 tablets in the AM, 1 in the afternoon, 1 in the evening before dinner  I will contact [facility] about physical therapy and to talk with the social worker there.    Registration is OPEN!    Third Annual Parkinson's Education Symposium   To register: [email or web address]      Search:  [employer] person attending individually Questions: [facility], [place] or Janett [email or web address]

## 2017-11-14 NOTE — Progress Notes (Signed)
Provided a copy of the ROI to talk with Katy Fitch, LCSW at [facility]. In addition, I called [facility] and left a message. Pending a return call.

## 2017-11-17 NOTE — Telephone Encounter (Signed)
[employer], spoke with Ebony Hail. I advised her we had attempted to fax P/T orders ob several occasions to [phone], but will not go through. She suggested I fax order to [phone]

## 2017-11-18 ENCOUNTER — Telehealth: Payer: Self-pay | Admitting: *Deleted

## 2017-11-18 NOTE — Telephone Encounter (Signed)
Received Physician Orders from [facility]; forwarded to provider/SLS 06/25

## 2017-11-20 DIAGNOSIS — L603 Nail dystrophy: Secondary | ICD-10-CM | POA: Diagnosis not present

## 2017-12-02 ENCOUNTER — Telehealth: Payer: Self-pay | Admitting: *Deleted

## 2017-12-02 NOTE — Telephone Encounter (Signed)
Received Physician Orders from [facility]; forwarded to provider/SLS 07/09

## 2017-12-02 NOTE — Telephone Encounter (Signed)
Received Physician Orders [facility]; forwarded to provider/SLS 07/09

## 2017-12-03 ENCOUNTER — Encounter: Payer: Medicare Other | Admitting: Internal Medicine

## 2017-12-10 ENCOUNTER — Telehealth: Payer: Self-pay | Admitting: *Deleted

## 2017-12-10 ENCOUNTER — Non-Acute Institutional Stay: Payer: Medicare Other | Admitting: Internal Medicine

## 2017-12-10 ENCOUNTER — Encounter: Payer: Self-pay | Admitting: Internal Medicine

## 2017-12-10 VITALS — BP 130/80 | HR 64 | Temp 97.8°F | Ht 60.0 in | Wt 133.0 lb

## 2017-12-10 DIAGNOSIS — I872 Venous insufficiency (chronic) (peripheral): Secondary | ICD-10-CM

## 2017-12-10 DIAGNOSIS — G609 Hereditary and idiopathic neuropathy, unspecified: Secondary | ICD-10-CM

## 2017-12-10 DIAGNOSIS — K5909 Other constipation: Secondary | ICD-10-CM | POA: Insufficient documentation

## 2017-12-10 DIAGNOSIS — F0280 Dementia in other diseases classified elsewhere without behavioral disturbance: Secondary | ICD-10-CM

## 2017-12-10 DIAGNOSIS — G20 Parkinson's disease: Secondary | ICD-10-CM | POA: Diagnosis not present

## 2017-12-10 DIAGNOSIS — R2681 Unsteadiness on feet: Secondary | ICD-10-CM

## 2017-12-10 DIAGNOSIS — Z85820 Personal history of malignant melanoma of skin: Secondary | ICD-10-CM

## 2017-12-10 DIAGNOSIS — G20A1 Parkinson's disease without dyskinesia, without mention of fluctuations: Secondary | ICD-10-CM

## 2017-12-10 DIAGNOSIS — E034 Atrophy of thyroid (acquired): Secondary | ICD-10-CM

## 2017-12-10 DIAGNOSIS — S22000S Wedge compression fracture of unspecified thoracic vertebra, sequela: Secondary | ICD-10-CM

## 2017-12-10 DIAGNOSIS — R140 Abdominal distension (gaseous): Secondary | ICD-10-CM | POA: Diagnosis not present

## 2017-12-10 DIAGNOSIS — I350 Nonrheumatic aortic (valve) stenosis: Secondary | ICD-10-CM

## 2017-12-10 DIAGNOSIS — I10 Essential (primary) hypertension: Secondary | ICD-10-CM | POA: Diagnosis not present

## 2017-12-10 NOTE — Telephone Encounter (Signed)
Received Physician Orders from [place]; forwarded to provider/SLS 07/17

## 2017-12-10 NOTE — Progress Notes (Signed)
Provider:  Rexene Edison. Mariea Clonts, D.O., C.M.D. Location:  Occupational psychologist of Service:  Clinic (12)  Previous PCP: Nani Ravens, Crosby Oyster, DO Patient Care Team: Shelda Pal, DO as PCP - General (Family Medicine) Jola Schmidt, MD as Consulting Physician (Ophthalmology) Tat, Eustace Quail, DO as Consulting Physician (Neurology) Martinique, Peter M, MD as Consulting Physician (Cardiology)  Extended Emergency Contact Information Primary Emergency Contact: [name] Address: [address]          [name] Phone: [phone] Mobile Phone: [phone] Relation: Daughter  Code Status: DNR Goals of Care: Advanced Directive information Advanced Directives 12/10/2017  Does Patient Have a Medical Advance Directive? Yes  Type of Paramedic of [place];Living will;Out of facility DNR (pink MOST or yellow form)  Does patient want to make changes to medical advance directive? No - Patient declined  Copy of [place] in Chart? Yes  Would patient like information on creating a medical advance directive? -  Pre-existing out of facility DNR order (yellow form or pink MOST form) Yellow form placed in chart (order not valid for inpatient use)      Chief Complaint  Patient presents with  . Medical Management of Chronic Issues    re-establish care    HPI: Patient is a 84 y.o. female seen today to establish with [facility].  Records are in epic from Dr. Nani Ravens.  Pt's daughter reports that it's getting more and more challenging to take her mother out of [facility] to appts so they decided to start seeing me again on site.  We discussed the issue that caused them to leave over constipation.  Pt's daughter reports that she did not realize her mother was not a good historian at that time and did not understand Parkinson's like she does now.    Recently, Mrs. Rappleye has had a toe  infection with cellulitis from a wound.  She'd been using her compression hose and they were not being washed regularly.  She is doing soaks now at bedtime.  Pt thinks she does them multiple times per day.  She continues to bruise easily.  She had been doing daily cycling for 10 mins after working with PT, OT and when they had taught her CNAs when in enhanced AL how to set her up to do her exercises; however, when she moved to regular AL, there is less support and she has not been doing this consistently b/c she cannot do it on her own and does not remember to.  Her gait continues to worsen with more freezing and unsteadiness with her rollator walker.  A PT order was given back in June, but per pt's daughter, pt has not actually gotten PT.    She has slept in her recliner for 30 years when she cared for her husband and children and now has a new lift chair.    Her daughter, [name],, reports that her mother is paranoid and delusional at times, does not sleep well, is up late at night.    [place] cognitive assessment has notably declined over time per Dr. Doristine Devoid note in June.  Now she scores 16/30 when she scored 21/30 in July of 2016.  There were discrepancies between what nursing staff had been noting vs her daughter's observations.   She has had melanoma resection on her left arm and follows with [facility].  She is moving her bowels on her current regimen.  Past Medical History:  Diagnosis Date  . Aortic stenosis, mild   . Constipation, chronic   . Gilbert syndrome   . HTN (hypertension)   . Hyperlipidemia   . Hypothyroidism   . Melanoma ([place])   . Neuropathy   . Osteoporosis   . Parkinson's disease ([place])   . Peripheral neuropathy    Bilateral  . Rheumatic fever    does not take Flu shot    Past Surgical History:  Procedure Laterality Date  . ABDOMINAL HYSTERECTOMY  1963   no BSO; no abnormal PAPs  . COLONOSCOPY  2000   negative; [place]  . [place]   . MELANOMA EXCISION  08/2014    Social History   Socioeconomic History  . Marital status: Widowed    Spouse name: Not on file  . Number of children: Not on file  . Years of education: Not on file  . Highest education level: Not on file  Occupational History  . Occupation: retired own [employer]  . Financial resource strain: Not on file  . Food insecurity:    Worry: Not on file    Inability: Not on file  . Transportation needs:    Medical: Not on file    Non-medical: Not on file  Tobacco Use  . Smoking status: Never Smoker  . Smokeless tobacco: Never Used  Substance and Sexual Activity  . Alcohol use: No  . Drug use: No  . Sexual activity: Not on file  Lifestyle  . Physical activity:    Days per week: Not on file    Minutes per session: Not on file  . Stress: Not on file  Relationships  . Social connections:    Talks on phone: Not on file    Gets together: Not on file    Attends religious service: Not on file    Active member of club or organization: Not on file    Attends meetings of clubs or organizations: Not on file    Relationship status: Not on file  Other Topics Concern  . Not on file  Social History Narrative   Lives at [facility] 01/05/2015   Widowed   Never smoked   Exercise walking   Alcohol none   POA/Living Will       reports that she has never smoked. She has never used smokeless tobacco. She reports that she does not drink alcohol or use drugs.  Functional Status Survey:    Family History  Problem Relation Age of Onset  . Dementia Mother        Alzheimers  . Stroke Mother        ? 64  . Cancer Son        multiple myeloma  . Pancreatic cancer Son   . Heart disease Brother   . Lung cancer Maternal Uncle        smoker  . Heart attack Neg Hx   . Diabetes Neg Hx   . Hearing loss Neg Hx     Health Maintenance  Topic Date Due  . PNA vac Low Risk Adult (1 of 2 - PCV13) 12/28/1998  . INFLUENZA  VACCINE  12/25/2017  . TETANUS/TDAP  08/10/2024  . DEXA SCAN  Completed    Allergies  Allergen Reactions  . Sulfonamide Derivatives     REACTION:generalized  swelling  . Sulfa Antibiotics Swelling    Outpatient Encounter Medications as of 12/10/2017  Medication Sig  . acetaminophen (  TYLENOL) 325 MG tablet Take 2 tablets (650 mg total) by mouth at bedtime as needed for mild pain.  Marland Kitchen amLODipine (NORVASC) 5 MG tablet Take 5 mg by mouth daily.  . Calcium Carbonate-Vitamin D (CALCIUM 600/VITAMIN D PO) Take 1 tablet by mouth daily.   . capsaicin (ZOSTRIX) 0.025 % cream Apply topically 2 (two) times daily.  . carbidopa-levodopa (SINEMET IR) 25-100 MG tablet 2 in the morning, 1 in the afternoon, 1 in the evening  . Cholecalciferol (VITAMIN D) 2000 UNITS CAPS Take 1 capsule (2,000 Units total) by mouth daily.  . furosemide (LASIX) 20 MG tablet Take 10 mg by mouth as needed.   Marland Kitchen levothyroxine (SYNTHROID, LEVOTHROID) 50 MCG tablet Take one on Sunday, Monday, Wednesday, and Friday 75 mcg other days  . levothyroxine (SYNTHROID, LEVOTHROID) 75 MCG tablet Take 1 on Tuesday, Thursday, saturday  . magnesium citrate SOLN Take 1 Bottle by mouth daily as needed.   . Multiple Vitamin (MULTIVITAMIN) capsule Take 1 capsule by mouth daily.    Marland Kitchen omeprazole (PRILOSEC) 20 MG capsule Take 20 mg by mouth daily.   . potassium chloride (K-DUR) 10 MEQ tablet Take 10 mEq by mouth as needed. Use when taking Lasix  . [DISCONTINUED] polyvinyl alcohol (LIQUIFILM TEARS) 1.4 % ophthalmic solution 1 drop as needed for dry eyes.   No facility-administered encounter medications on file as of 12/10/2017.     Review of Systems  Constitutional: Negative for chills, fever and malaise/fatigue.  HENT: Negative for congestion.   Respiratory: Negative for cough and shortness of breath.   Cardiovascular: Positive for leg swelling. Negative for chest pain and palpitations.       Wears compression hose  Gastrointestinal: Positive  for constipation. Negative for abdominal pain, blood in stool, diarrhea and melena.  Genitourinary: Negative for dysuria.  Musculoskeletal: Negative for falls and joint pain.       Gait problems  Skin: Negative for itching and rash.  Neurological: Negative for dizziness and loss of consciousness.  Psychiatric/Behavioral: Positive for memory loss. Negative for depression. The patient has insomnia. The patient is not nervous/anxious.     Vitals:   12/10/17 0844  BP: 130/80  Pulse: 64  Temp: 97.8 F (36.6 C)  TempSrc: Oral  SpO2: 96%  Weight: 133 lb (60.3 kg)  Height: 5' (1.524 m)   Body mass index is 25.97 kg/m. Physical Exam  Constitutional: She is oriented to person, place, and time. She appears well-developed and well-nourished. No distress.  HENT:  Head: Normocephalic and atraumatic.  Right Ear: External ear normal.  Left Ear: External ear normal.  Nose: Nose normal.  Mouth/Throat: Oropharynx is clear and moist. No oropharyngeal exudate.  Eyes: Pupils are equal, round, and reactive to light. Conjunctivae and EOM are normal.  Neck: Normal range of motion. Neck supple. No tracheal deviation present.  Cardiovascular: Normal rate, regular rhythm and intact distal pulses.  Murmur heard. Second ICS murmur  Pulmonary/Chest: Effort normal and breath sounds normal. No respiratory distress.  Abdominal: Soft. Bowel sounds are normal. She exhibits distension. She exhibits no mass. There is no tenderness. There is no rebound and no guarding.  Protuberant abdomen (kyphosis)  Musculoskeletal: She exhibits no tenderness.  Stooped posture with walker  Lymphadenopathy:    She has no cervical adenopathy.  Neurological: She is alert and oriented to person, place, and time. No cranial nerve deficit. She exhibits abnormal muscle tone. Coordination abnormal.  Unsteady, shuffling gait, freezing and festinating seen, uses rollator walker   Psychiatric: She  has a normal mood and affect.  Very  pleasant, laid back and quiet    Labs reviewed: Basic Metabolic Panel: No results for input(s): NA, K, CL, CO2, GLUCOSE, BUN, CREATININE, CALCIUM, MG, PHOS in the last 8760 hours. Liver Function Tests: No results for input(s): AST, ALT, ALKPHOS, BILITOT, PROT, ALBUMIN in the last 8760 hours. No results for input(s): LIPASE, AMYLASE in the last 8760 hours. No results for input(s): AMMONIA in the last 8760 hours. CBC: No results for input(s): WBC, NEUTROABS, HGB, HCT, MCV, PLT in the last 8760 hours. Cardiac Enzymes: No results for input(s): CKTOTAL, CKMB, CKMBINDEX, TROPONINI in the last 8760 hours. BNP: Invalid input(s): POCBNP No results found for: HGBA1C Lab Results  Component Value Date   TSH 3.78 12/04/2016   Lab Results  Component Value Date   [id number] 165 08/11/2014   Assessment/Plan 1. Parkinson's disease ([place]) -gradually progressive, continues to follow with [facility] -reordered PT, OT reeval and tx--last seen last summer when in enhanced AL and doing well, but moved back to regular AL, declined functionally since, had toe infection from hose not being washed and   2. Hereditary and idiopathic peripheral neuropathy -discussed gabapentin potentially for pain if this becomes a bigger issue, does have sensory loss on exam; also we need to monitor her for recurrent of wounds  3. 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 Hypothyroidism due to acquired atrophy of thyroid -cont levothyroxine therapy on alternating dose  5. Abdominal distention -ongoing, due to #6, but this seems to be well controlled now  6. Chronic constipation -controlled with bowel regimen and fiber in diet, hydration  7. Chronic venous insufficiency -cont to elevate feet at  rest -compression hose on in am, off at hs  8. Unsteady gait -resume PT, OT--seems resident ideally should have stayed in AL enhanced level of care where she got more assistance  9. Closed compression fracture of thoracic vertebra, sequela -pain controlled with tylenol at bedtime as needed  10. Aortic valve stenosis, etiology of cardiac valve disease unspecified -stable, no symptoms  11. History of melanoma -cont f/u with [facility] derm, no new lesions noted  12. Essential hypertension -bp well controlled w/o dizziness, monitor  Labs/tests ordered:  Cbc, cmp, tsh before 4 mo f/u in [place]. Kendallyn Lippold, D.O. [facility] [address], [place] [address] Cell Phone (Mon-Fri 8am-5pm):  [phone] On Call:  [phone] & follow prompts after 5pm & weekends Office Phone:  [phone] Office Fax:  [phone]

## 2018-01-05 DIAGNOSIS — R2689 Other abnormalities of gait and mobility: Secondary | ICD-10-CM | POA: Diagnosis not present

## 2018-01-05 DIAGNOSIS — F0280 Dementia in other diseases classified elsewhere without behavioral disturbance: Secondary | ICD-10-CM | POA: Diagnosis not present

## 2018-01-05 DIAGNOSIS — G20 Parkinson's disease: Secondary | ICD-10-CM | POA: Diagnosis not present

## 2018-01-05 DIAGNOSIS — G3184 Mild cognitive impairment, so stated: Secondary | ICD-10-CM | POA: Diagnosis not present

## 2018-01-05 DIAGNOSIS — G609 Hereditary and idiopathic neuropathy, unspecified: Secondary | ICD-10-CM | POA: Diagnosis not present

## 2018-01-11 DIAGNOSIS — M2011 Hallux valgus (acquired), right foot: Secondary | ICD-10-CM | POA: Diagnosis not present

## 2018-01-11 DIAGNOSIS — R296 Repeated falls: Secondary | ICD-10-CM | POA: Diagnosis not present

## 2018-01-11 DIAGNOSIS — M2012 Hallux valgus (acquired), left foot: Secondary | ICD-10-CM | POA: Diagnosis not present

## 2018-01-11 DIAGNOSIS — F0280 Dementia in other diseases classified elsewhere without behavioral disturbance: Secondary | ICD-10-CM | POA: Diagnosis not present

## 2018-01-11 DIAGNOSIS — R260 Ataxic gait: Secondary | ICD-10-CM | POA: Diagnosis not present

## 2018-01-11 DIAGNOSIS — M4126 Other idiopathic scoliosis, lumbar region: Secondary | ICD-10-CM | POA: Diagnosis not present

## 2018-01-11 DIAGNOSIS — G20 Parkinson's disease: Secondary | ICD-10-CM | POA: Diagnosis not present

## 2018-01-12 DIAGNOSIS — R296 Repeated falls: Secondary | ICD-10-CM | POA: Diagnosis not present

## 2018-01-12 DIAGNOSIS — M2012 Hallux valgus (acquired), left foot: Secondary | ICD-10-CM | POA: Diagnosis not present

## 2018-01-12 DIAGNOSIS — M2011 Hallux valgus (acquired), right foot: Secondary | ICD-10-CM | POA: Diagnosis not present

## 2018-01-12 DIAGNOSIS — R260 Ataxic gait: Secondary | ICD-10-CM | POA: Diagnosis not present

## 2018-01-12 DIAGNOSIS — F0280 Dementia in other diseases classified elsewhere without behavioral disturbance: Secondary | ICD-10-CM | POA: Diagnosis not present

## 2018-01-12 DIAGNOSIS — G20 Parkinson's disease: Secondary | ICD-10-CM | POA: Diagnosis not present

## 2018-01-19 DIAGNOSIS — R296 Repeated falls: Secondary | ICD-10-CM | POA: Diagnosis not present

## 2018-01-19 DIAGNOSIS — M2011 Hallux valgus (acquired), right foot: Secondary | ICD-10-CM | POA: Diagnosis not present

## 2018-01-19 DIAGNOSIS — M2012 Hallux valgus (acquired), left foot: Secondary | ICD-10-CM | POA: Diagnosis not present

## 2018-01-19 DIAGNOSIS — F0280 Dementia in other diseases classified elsewhere without behavioral disturbance: Secondary | ICD-10-CM | POA: Diagnosis not present

## 2018-01-19 DIAGNOSIS — R260 Ataxic gait: Secondary | ICD-10-CM | POA: Diagnosis not present

## 2018-01-19 DIAGNOSIS — G20 Parkinson's disease: Secondary | ICD-10-CM | POA: Diagnosis not present

## 2018-01-20 DIAGNOSIS — G20 Parkinson's disease: Secondary | ICD-10-CM | POA: Diagnosis not present

## 2018-01-20 DIAGNOSIS — M2011 Hallux valgus (acquired), right foot: Secondary | ICD-10-CM | POA: Diagnosis not present

## 2018-01-20 DIAGNOSIS — L814 Other melanin hyperpigmentation: Secondary | ICD-10-CM | POA: Diagnosis not present

## 2018-01-20 DIAGNOSIS — L570 Actinic keratosis: Secondary | ICD-10-CM | POA: Diagnosis not present

## 2018-01-20 DIAGNOSIS — F0280 Dementia in other diseases classified elsewhere without behavioral disturbance: Secondary | ICD-10-CM | POA: Diagnosis not present

## 2018-01-20 DIAGNOSIS — R260 Ataxic gait: Secondary | ICD-10-CM | POA: Diagnosis not present

## 2018-01-20 DIAGNOSIS — M2012 Hallux valgus (acquired), left foot: Secondary | ICD-10-CM | POA: Diagnosis not present

## 2018-01-20 DIAGNOSIS — R296 Repeated falls: Secondary | ICD-10-CM | POA: Diagnosis not present

## 2018-01-20 DIAGNOSIS — L821 Other seborrheic keratosis: Secondary | ICD-10-CM | POA: Diagnosis not present

## 2018-01-20 DIAGNOSIS — Z85828 Personal history of other malignant neoplasm of skin: Secondary | ICD-10-CM | POA: Diagnosis not present

## 2018-01-20 DIAGNOSIS — D1801 Hemangioma of skin and subcutaneous tissue: Secondary | ICD-10-CM | POA: Diagnosis not present

## 2018-01-22 DIAGNOSIS — F0280 Dementia in other diseases classified elsewhere without behavioral disturbance: Secondary | ICD-10-CM | POA: Diagnosis not present

## 2018-01-22 DIAGNOSIS — M2011 Hallux valgus (acquired), right foot: Secondary | ICD-10-CM | POA: Diagnosis not present

## 2018-01-22 DIAGNOSIS — M2012 Hallux valgus (acquired), left foot: Secondary | ICD-10-CM | POA: Diagnosis not present

## 2018-01-22 DIAGNOSIS — R260 Ataxic gait: Secondary | ICD-10-CM | POA: Diagnosis not present

## 2018-01-22 DIAGNOSIS — R296 Repeated falls: Secondary | ICD-10-CM | POA: Diagnosis not present

## 2018-01-22 DIAGNOSIS — G20 Parkinson's disease: Secondary | ICD-10-CM | POA: Diagnosis not present

## 2018-01-23 DIAGNOSIS — G20 Parkinson's disease: Secondary | ICD-10-CM | POA: Diagnosis not present

## 2018-01-23 DIAGNOSIS — F0280 Dementia in other diseases classified elsewhere without behavioral disturbance: Secondary | ICD-10-CM | POA: Diagnosis not present

## 2018-01-23 DIAGNOSIS — R296 Repeated falls: Secondary | ICD-10-CM | POA: Diagnosis not present

## 2018-01-23 DIAGNOSIS — M2012 Hallux valgus (acquired), left foot: Secondary | ICD-10-CM | POA: Diagnosis not present

## 2018-01-23 DIAGNOSIS — M2011 Hallux valgus (acquired), right foot: Secondary | ICD-10-CM | POA: Diagnosis not present

## 2018-01-23 DIAGNOSIS — R260 Ataxic gait: Secondary | ICD-10-CM | POA: Diagnosis not present

## 2018-01-27 DIAGNOSIS — R260 Ataxic gait: Secondary | ICD-10-CM | POA: Diagnosis not present

## 2018-01-27 DIAGNOSIS — F0280 Dementia in other diseases classified elsewhere without behavioral disturbance: Secondary | ICD-10-CM | POA: Diagnosis not present

## 2018-01-27 DIAGNOSIS — M4126 Other idiopathic scoliosis, lumbar region: Secondary | ICD-10-CM | POA: Diagnosis not present

## 2018-01-27 DIAGNOSIS — M2011 Hallux valgus (acquired), right foot: Secondary | ICD-10-CM | POA: Diagnosis not present

## 2018-01-27 DIAGNOSIS — G20 Parkinson's disease: Secondary | ICD-10-CM | POA: Diagnosis not present

## 2018-01-27 DIAGNOSIS — M2012 Hallux valgus (acquired), left foot: Secondary | ICD-10-CM | POA: Diagnosis not present

## 2018-01-27 DIAGNOSIS — R296 Repeated falls: Secondary | ICD-10-CM | POA: Diagnosis not present

## 2018-01-28 DIAGNOSIS — G20 Parkinson's disease: Secondary | ICD-10-CM | POA: Diagnosis not present

## 2018-01-28 DIAGNOSIS — M2011 Hallux valgus (acquired), right foot: Secondary | ICD-10-CM | POA: Diagnosis not present

## 2018-01-28 DIAGNOSIS — R296 Repeated falls: Secondary | ICD-10-CM | POA: Diagnosis not present

## 2018-01-28 DIAGNOSIS — M2012 Hallux valgus (acquired), left foot: Secondary | ICD-10-CM | POA: Diagnosis not present

## 2018-01-28 DIAGNOSIS — F0280 Dementia in other diseases classified elsewhere without behavioral disturbance: Secondary | ICD-10-CM | POA: Diagnosis not present

## 2018-01-28 DIAGNOSIS — R260 Ataxic gait: Secondary | ICD-10-CM | POA: Diagnosis not present

## 2018-01-29 DIAGNOSIS — R296 Repeated falls: Secondary | ICD-10-CM | POA: Diagnosis not present

## 2018-01-29 DIAGNOSIS — R260 Ataxic gait: Secondary | ICD-10-CM | POA: Diagnosis not present

## 2018-01-29 DIAGNOSIS — F0280 Dementia in other diseases classified elsewhere without behavioral disturbance: Secondary | ICD-10-CM | POA: Diagnosis not present

## 2018-01-29 DIAGNOSIS — M2012 Hallux valgus (acquired), left foot: Secondary | ICD-10-CM | POA: Diagnosis not present

## 2018-01-29 DIAGNOSIS — M2011 Hallux valgus (acquired), right foot: Secondary | ICD-10-CM | POA: Diagnosis not present

## 2018-01-29 DIAGNOSIS — G20 Parkinson's disease: Secondary | ICD-10-CM | POA: Diagnosis not present

## 2018-02-03 DIAGNOSIS — M2011 Hallux valgus (acquired), right foot: Secondary | ICD-10-CM | POA: Diagnosis not present

## 2018-02-03 DIAGNOSIS — F0280 Dementia in other diseases classified elsewhere without behavioral disturbance: Secondary | ICD-10-CM | POA: Diagnosis not present

## 2018-02-03 DIAGNOSIS — M2012 Hallux valgus (acquired), left foot: Secondary | ICD-10-CM | POA: Diagnosis not present

## 2018-02-03 DIAGNOSIS — R260 Ataxic gait: Secondary | ICD-10-CM | POA: Diagnosis not present

## 2018-02-03 DIAGNOSIS — G20 Parkinson's disease: Secondary | ICD-10-CM | POA: Diagnosis not present

## 2018-02-03 DIAGNOSIS — R296 Repeated falls: Secondary | ICD-10-CM | POA: Diagnosis not present

## 2018-02-04 ENCOUNTER — Ambulatory Visit (INDEPENDENT_AMBULATORY_CARE_PROVIDER_SITE_OTHER): Payer: Medicare Other | Admitting: Sports Medicine

## 2018-02-04 ENCOUNTER — Non-Acute Institutional Stay: Payer: Medicare Other

## 2018-02-04 VITALS — BP 128/72 | Ht 60.0 in | Wt 130.0 lb

## 2018-02-04 DIAGNOSIS — M216X1 Other acquired deformities of right foot: Secondary | ICD-10-CM

## 2018-02-04 DIAGNOSIS — G20 Parkinson's disease: Secondary | ICD-10-CM | POA: Diagnosis not present

## 2018-02-04 DIAGNOSIS — R296 Repeated falls: Secondary | ICD-10-CM | POA: Diagnosis not present

## 2018-02-04 DIAGNOSIS — Z0000 Encounter for general adult medical examination without abnormal findings: Secondary | ICD-10-CM | POA: Diagnosis not present

## 2018-02-04 DIAGNOSIS — M2012 Hallux valgus (acquired), left foot: Secondary | ICD-10-CM

## 2018-02-04 DIAGNOSIS — M2011 Hallux valgus (acquired), right foot: Secondary | ICD-10-CM

## 2018-02-04 DIAGNOSIS — M216X2 Other acquired deformities of left foot: Secondary | ICD-10-CM

## 2018-02-04 DIAGNOSIS — F0280 Dementia in other diseases classified elsewhere without behavioral disturbance: Secondary | ICD-10-CM | POA: Diagnosis not present

## 2018-02-04 DIAGNOSIS — R260 Ataxic gait: Secondary | ICD-10-CM | POA: Diagnosis not present

## 2018-02-04 NOTE — Patient Instructions (Addendum)
Natalie Lam , Thank you for taking time to come for your Medicare Wellness Visit. I appreciate your ongoing commitment to your health goals. Please review the following plan we discussed and let me know if I can assist you in the future.   Screening recommendations/referrals: Colonoscopy excluded, over age 82 Mammogram excluded, over age 71 Bone Density up to date Recommended yearly ophthalmology/optometry visit for glaucoma screening and checkup Recommended yearly dental visit for hygiene and checkup  Vaccinations: Influenza vaccine due, will receive at [facility]  Pneumococcal vaccine 13 due, ordered Tdap vaccine up to date, due 08/10/2024 Shingles vaccine not in past records    Advanced directives: in chart  Conditions/risks identified: none  Next appointment: Dr. Mariea Clonts 04/15/2018 @ 9am   Preventive Care 44 Years and Older, Female Preventive care refers to lifestyle choices and visits with your health care provider that can promote health and wellness. What does preventive care include?  A yearly physical exam. This is also called an annual well check.  Dental exams once or twice a year.  Routine eye exams. Ask your health care provider how often you should have your eyes checked.  Personal lifestyle choices, including:  Daily care of your teeth and gums.  Regular physical activity.  Eating a healthy diet.  Avoiding tobacco and drug use.  Limiting alcohol use.  Practicing safe sex.  Taking low-dose aspirin every day.  Taking vitamin and mineral supplements as recommended by your health care provider. What happens during an annual well check? The services and screenings done by your health care provider during your annual well check will depend on your age, overall health, lifestyle risk factors, and family history of disease. Counseling  Your health care provider may ask you questions about your:  Alcohol use.  Tobacco use.  Drug use.  Emotional  well-being.  Home and relationship well-being.  Sexual activity.  Eating habits.  History of falls.  Memory and ability to understand (cognition).  Work and work Statistician.  Reproductive health. Screening  You may have the following tests or measurements:  Height, weight, and BMI.  Blood pressure.  Lipid and cholesterol levels. These may be checked every 5 years, or more frequently if you are over 78 years old.  Skin check.  Lung cancer screening. You may have this screening every year starting at age 60 if you have a 30-pack-year history of smoking and currently smoke or have quit within the past 15 years.  Fecal occult blood test (FOBT) of the stool. You may have this test every year starting at age 74.  Flexible sigmoidoscopy or colonoscopy. You may have a sigmoidoscopy every 5 years or a colonoscopy every 10 years starting at age 30.  Hepatitis C blood test.  Hepatitis B blood test.  Sexually transmitted disease (STD) testing.  Diabetes screening. This is done by checking your blood sugar (glucose) after you have not eaten for a while (fasting). You may have this done every 1-3 years.  Bone density scan. This is done to screen for osteoporosis. You may have this done starting at age 36.  Mammogram. This may be done every 1-2 years. Talk to your health care provider about how often you should have regular mammograms. Talk with your health care provider about your test results, treatment options, and if necessary, the need for more tests. Vaccines  Your health care provider may recommend certain vaccines, such as:  Influenza vaccine. This is recommended every year.  Tetanus, diphtheria, and acellular pertussis (Tdap, Td)  vaccine. You may need a Td booster every 10 years.  Zoster vaccine. You may need this after age 42.  Pneumococcal 13-valent conjugate (PCV13) vaccine. One dose is recommended after age 4.  Pneumococcal polysaccharide (PPSV23) vaccine. One  dose is recommended after age 2. Talk to your health care provider about which screenings and vaccines you need and how often you need them. This information is not intended to replace advice given to you by your health care provider. Make sure you discuss any questions you have with your health care provider. Document Released: 06/09/2015 Document Revised: 01/31/2016 Document Reviewed: 03/14/2015 Elsevier Interactive Patient Education  2017 [place] Prevention in the Home Falls can cause injuries. They can happen to people of all ages. There are many things you can do to make your home safe and to help prevent falls. What can I do on the outside of my home?  Regularly fix the edges of walkways and driveways and fix any cracks.  Remove anything that might make you trip as you walk through a door, such as a raised step or threshold.  Trim any bushes or trees on the path to your home.  Use bright outdoor lighting.  Clear any walking paths of anything that might make someone trip, such as rocks or tools.  Regularly check to see if handrails are loose or broken. Make sure that both sides of any steps have handrails.  Any raised decks and porches should have guardrails on the edges.  Have any leaves, snow, or ice cleared regularly.  Use sand or salt on walking paths during winter.  Clean up any spills in your garage right away. This includes oil or grease spills. What can I do in the bathroom?  Use night lights.  Install grab bars by the toilet and in the tub and shower. Do not use towel bars as grab bars.  Use non-skid mats or decals in the tub or shower.  If you need to sit down in the shower, use a plastic, non-slip stool.  Keep the floor dry. Clean up any water that spills on the floor as soon as it happens.  Remove soap buildup in the tub or shower regularly.  Attach bath mats securely with double-sided non-slip rug tape.  Do not have throw rugs and other  things on the floor that can make you trip. What can I do in the bedroom?  Use night lights.  Make sure that you have a light by your bed that is easy to reach.  Do not use any sheets or blankets that are too big for your bed. They should not hang down onto the floor.  Have a firm chair that has side arms. You can use this for support while you get dressed.  Do not have throw rugs and other things on the floor that can make you trip. What can I do in the kitchen?  Clean up any spills right away.  Avoid walking on wet floors.  Keep items that you use a lot in easy-to-reach places.  If you need to reach something above you, use a strong step stool that has a grab bar.  Keep electrical cords out of the way.  Do not use floor polish or wax that makes floors slippery. If you must use wax, use non-skid floor wax.  Do not have throw rugs and other things on the floor that can make you trip. What can I do with my stairs?  Do not leave  any items on the stairs.  Make sure that there are handrails on both sides of the stairs and use them. Fix handrails that are broken or loose. Make sure that handrails are as long as the stairways.  Check any carpeting to make sure that it is firmly attached to the stairs. Fix any carpet that is loose or worn.  Avoid having throw rugs at the top or bottom of the stairs. If you do have throw rugs, attach them to the floor with carpet tape.  Make sure that you have a light switch at the top of the stairs and the bottom of the stairs. If you do not have them, ask someone to add them for you. What else can I do to help prevent falls?  Wear shoes that:  Do not have high heels.  Have rubber bottoms.  Are comfortable and fit you well.  Are closed at the toe. Do not wear sandals.  If you use a stepladder:  Make sure that it is fully opened. Do not climb a closed stepladder.  Make sure that both sides of the stepladder are locked into place.  Ask  someone to hold it for you, if possible.  Clearly mark and make sure that you can see:  Any grab bars or handrails.  First and last steps.  Where the edge of each step is.  Use tools that help you move around (mobility aids) if they are needed. These include:  Canes.  Walkers.  Scooters.  Crutches.  Turn on the lights when you go into a dark area. Replace any light bulbs as soon as they burn out.  Set up your furniture so you have a clear path. Avoid moving your furniture around.  If any of your floors are uneven, fix them.  If there are any pets around you, be aware of where they are.  Review your medicines with your doctor. Some medicines can make you feel dizzy. This can increase your chance of falling. Ask your doctor what other things that you can do to help prevent falls. This information is not intended to replace advice given to you by your health care provider. Make sure you discuss any questions you have with your health care provider. Document Released: 03/09/2009 Document Revised: 10/19/2015 Document Reviewed: 06/17/2014 Elsevier Interactive Patient Education  2017 [employer].

## 2018-02-04 NOTE — Progress Notes (Signed)
   HPI  CC: Left foot pain Natalie Lam is an 84 year old female with history of Parkinson's who presents with left foot pain.  [facility], and sees occupational therapy there.  She was evaluated last week and found to have pronation bilaterally in her feet as well as hallux valgus deformities in both toes.  She states she has been dealing with pain in her feet for several years.  Recently, she switched to new shoes with a wide toe box.  This seems to have helped her toe pain somewhat.  She has noticed that she has been busting out of the side of her shoe, on the medial side.  She has known neuropathy in her bilateral feet.  She has a shuffling gait secondary to Parkinson's disease.  She has no recent falls.  She ambulates with a rolling walker.  She has no recent trauma to her feet.  Past Surgeries: None listed Smoking: Denies Family Hx: Noncontributory  ROS: Per HPI; in addition no fever, no rash, no additional weakness, no additional numbness, no additional paresthesias, and no additional falls/injury.   Past medical history, allergies, medications reviewed by myself at today's visit. Objective: BP 128/72   Ht 5' (1.524 m)   Wt 130 lb (59 kg)   BMI 25.39 kg/m  Gen: Right-Hand Dominant. NAD, well groomed, a/o x3, normal affect.  CV: Well-perfused. Warm.  Resp: Non-labored.  Neuro: Sensation intact throughout.  Shuffling, magnetic gait on ambulation. Gait: Shuffling magnetic gait on ambulation.  Pronation of both feet noted.  Bilateral foot exam: Hallux valgus deformity noted on bilateral great toes.  Tenderness palpation over the first and second toes where they meet due to the hallux valgus deformities.  Full range of motion throughout foot exam.  Strength 5 out of 5 throughout foot exam.  Negative anterior drawer bilaterally.  Achilles reflex 2+.  Pedal pulse 2+.  Assessment and Plan:  1. Bilateral hallux valgus 2.  Pronation of left and right foot  I  discussed treatment options with Natalie Lam at today's visit.  She was amendable to trying some green inserts with scaphoid pads on both of them.  We put these into her shoes and she walked down the hall.  She seemed to have some relief with this measure.  This also corrected the pronation on her gait.  There is some concern that given her magnetic gait with Parkinson's that she is at increased fall risk.  These measures will hopefully minimize that risk.  We will see her back in clinic in 6 weeks for follow-up.  We could consider doing custom orthotics if she gets additional benefit from these.  Natalie Rife, MD [facility] Fellow 02/04/2018 7:53 PM

## 2018-02-04 NOTE — Patient Instructions (Signed)
Thank you for coming to see Natalie Lam today in clinic.  You were found to have an interning of your left foot when you walk.  We gave you some shoe inserts today with a scaphoid pad and then.  This will hopefully correct your foot alignment and help with the pain you are having with your feet.  We will see back in 6 weeks for reevaluation, thank you.

## 2018-02-04 NOTE — Progress Notes (Signed)
Subjective:   Natalie Lam is a 84 y.o. female who presents for Medicare Annual (Subsequent) preventive examination at [place]  Last AWV-06/21/2013     Objective:     Vitals: BP 125/70 (BP Location: Left Arm, Patient Position: Sitting)   Pulse 66   Temp 97.9 F (36.6 C) (Oral)   Ht 5' (1.524 m)   Wt 130 lb (59 kg)   BMI 25.39 kg/m   Body mass index is 25.39 kg/m.  Advanced Directives 02/04/2018 12/10/2017 04/01/2016 02/14/2016 01/24/2016 01/23/2016 10/11/2015  Does Patient Have a Medical Advance Directive? _0  Yes Yes  Type of Paramedic of [place];Living will;Out of facility DNR (pink MOST or yellow form) [place];Living will;Out of facility DNR (pink MOST or yellow form) Out of facility DNR (pink MOST or yellow form) Out of facility DNR (pink MOST or yellow form);[place];Living will Out of facility DNR (pink MOST or yellow form) Out of facility DNR (pink MOST or yellow form);[place];Living will [place];Out of facility DNR (pink MOST or yellow form)  Does patient want to make changes to medical advance directive? No - Patient declined No - Patient declined - - - - -  Copy of [place] in Chart? Yes Yes - Yes - Yes Yes  Would patient like information on creating a medical advance directive? - - - - - - -  Pre-existing out of facility DNR order (yellow form or pink MOST form) Yellow form placed in chart (order not valid for inpatient use) Yellow form placed in chart (order not valid for inpatient use) - Yellow form placed in chart (order not valid for inpatient use) - Yellow form placed in chart (order not valid for inpatient use) Yellow form placed in chart (order not valid for inpatient use)    Tobacco Social History   Tobacco Use  Smoking Status Never Smoker  Smokeless Tobacco Never Used     Counseling given: Not  Answered   Clinical Intake:  Pre-visit preparation completed: No  Pain : No/denies pain     Nutritional Risks: None Diabetes: No  How often do you need to have someone help you when you read instructions, pamphlets, or other written materials from your doctor or pharmacy?: 2 - Rarely What is the last grade level you completed in school?: [employer]  Interpreter Needed?: No  Information entered by :: Tyson Dense, RN  Past Medical History:  Diagnosis Date  . Aortic stenosis, mild   . Constipation, chronic   . Gilbert syndrome   . HTN (hypertension)   . Hyperlipidemia   . Hypothyroidism   . Melanoma ([place])   . Neuropathy   . Osteoporosis   . Parkinson's disease ([place])   . Peripheral neuropathy    Bilateral  . Rheumatic fever    does not take Flu shot    Past Surgical History:  Procedure Laterality Date  . ABDOMINAL HYSTERECTOMY  1963   no BSO; no abnormal PAPs  . COLONOSCOPY  2000   negative; [place]  . [place]  . MELANOMA EXCISION  08/2014   Family History  Problem Relation Age of Onset  . Dementia Mother        Alzheimers  . Stroke Mother        ? 43  . Cancer Son        multiple myeloma  . Pancreatic cancer Son   .  Heart disease Brother   . Lung cancer Maternal Uncle        smoker  . Heart attack Neg Hx   . Diabetes Neg Hx   . Hearing loss Neg Hx    Social History   Socioeconomic History  . Marital status: Widowed    Spouse name: Not on file  . Number of children: Not on file  . Years of education: Not on file  . Highest education level: Not on file  Occupational History  . Occupation: retired own [employer]  . Financial resource strain: Not hard at all  . Food insecurity:    Worry: Never true    Inability: Never true  . Transportation needs:    Medical: No    Non-medical: No  Tobacco Use  . Smoking status: Never Smoker  . Smokeless tobacco: Never Used  Substance and Sexual Activity   . Alcohol use: No  . Drug use: No  . Sexual activity: Not on file  Lifestyle  . Physical activity:    Days per week: 7 days    Minutes per session: 20 min  . Stress: Not at all  Relationships  . Social connections:    Talks on phone: Three times a week    Gets together: Three times a week    Attends religious service: Never    Active member of club or organization: No    Attends meetings of clubs or organizations: Never    Relationship status: Widowed  Other Topics Concern  . Not on file  Social History Narrative   Lives at [facility] 01/05/2015   Widowed   Never smoked   Exercise walking   Alcohol none   POA/Living Will       Outpatient Encounter Medications as of 02/04/2018  Medication Sig  . acetaminophen (TYLENOL) 325 MG tablet Take 2 tablets (650 mg total) by mouth at bedtime as needed for mild pain.  Marland Kitchen amLODipine (NORVASC) 5 MG tablet Take 5 mg by mouth daily.  . Calcium Carbonate-Vitamin D (CALCIUM 600/VITAMIN D PO) Take 1 tablet by mouth daily.   . capsaicin (ZOSTRIX) 0.025 % cream Apply topically 2 (two) times daily.  . carbidopa-levodopa (SINEMET IR) 25-100 MG tablet 2 in the morning, 1 in the afternoon, 1 in the evening  . Cholecalciferol (VITAMIN D) 2000 UNITS CAPS Take 1 capsule (2,000 Units total) by mouth daily.  . furosemide (LASIX) 20 MG tablet Take 10 mg by mouth as needed.   Marland Kitchen levothyroxine (SYNTHROID, LEVOTHROID) 50 MCG tablet Take one on Sunday, Monday, Wednesday, and Friday 75 mcg other days  . levothyroxine (SYNTHROID, LEVOTHROID) 75 MCG tablet Take 1 on Tuesday, Thursday, saturday  . magnesium citrate SOLN Take 1 Bottle by mouth daily as needed.   . Multiple Vitamin (MULTIVITAMIN) capsule Take 1 capsule by mouth daily.    Marland Kitchen omeprazole (PRILOSEC) 20 MG capsule Take 20 mg by mouth daily.   . potassium chloride (K-DUR) 10 MEQ tablet Take 10 mEq by mouth as needed. Use when taking Lasix   No facility-administered encounter medications on file  as of 02/04/2018.     Activities of Daily Living In your present state of health, do you have any difficulty performing the following activities: 02/04/2018  Hearing? N  Vision? N  Difficulty concentrating or making decisions? Y  Walking or climbing stairs? Y  Dressing or bathing? N  Doing errands, shopping? Y  Preparing Food and eating ? Y  Using  the Toilet? N  In the past six months, have you accidently leaked urine? N  Do you have problems with loss of bowel control? N  Managing your Medications? Y  Managing your Finances? Y  Housekeeping or managing your Housekeeping? Y  Some recent data might be hidden    Patient Care Team: Gayland Curry, DO as PCP - General (Geriatric Medicine) Jola Schmidt, MD as Consulting Physician (Ophthalmology) Tat, Eustace Quail, DO as Consulting Physician (Neurology) [place], Peter M, MD as Consulting Physician (Cardiology)    Assessment:   This is a routine wellness examination for Natalie Lam.  Exercise Activities and Dietary recommendations Current Exercise Habits: Home exercise routine, Type of exercise: walking, Time (Minutes): 20, Frequency (Times/Week): 7, Weekly Exercise (Minutes/Week): 140, Intensity: Mild, Exercise limited by: orthopedic condition(s)  Goals   None     Fall Risk Fall Risk  02/04/2018 12/10/2017 11/14/2017 05/09/2017 12/09/2016  Falls in the past year? No No No Yes No  Number falls in past yr: - - - 2 or more -  Comment - - - - -  Injury with Fall? - - - No -  Comment - - - - -  Risk Factor Category  - - - High Fall Risk -  Follow up - - - Falls evaluation completed -   Is the patient's home free of loose throw rugs in walkways, pet beds, electrical cords, etc?   yes      Grab bars in the bathroom? yes      Handrails on the stairs?   yes      Adequate lighting?   yes   Depression Screen PHQ 2/9 Scores 02/04/2018 12/10/2017 11/01/2016 10/11/2015  PHQ - 2 Score 0 0 0 0  PHQ- 9 Score - - 0 -     Cognitive Function  completed within last year MMSE - Mini Mental State Exam 10/28/2017 05/09/2017 06/20/2016  Not completed: - Unable to complete Unable to complete  Orientation to time 3 - -  Orientation to Place 5 - -  Registration 3 - -  Attention/ Calculation 5 - -  Recall 1 - -  Language- name 2 objects 2 - -  Language- repeat 1 - -  Language- follow 3 step command 3 - -  Language- read & follow direction 1 - -  Write a sentence 1 - -  Copy design 0 - -  Total score 25 - -   Montreal Cognitive Assessment  11/14/2017 11/19/2016 12/13/2014  Visuospatial/ Executive (0/5) _0 Naming (0/3) _1 Attention: Read list of digits (0/2) _2 Attention: Read list of letters (0/1) _3 Attention: Serial 7 subtraction starting at 100 (0/3) 0 0 1  Language: Repeat phrase (0/2) _4 Language : Fluency (0/1) 1 0 1  Abstraction (0/2) _5 Delayed Recall (0/5) 1 0 0  Orientation (0/6) _6 Total _7 Adjusted Score (based on education) _8 Immunization History  Administered Date(s) Administered  . Influenza Inj Mdck Quad Pf 03/14/2016  . PPD Test 04/29/2016  . Tdap 08/11/2014    Qualifies for Shingles Vaccine? Not in past records  Screening Tests Health Maintenance  Topic Date Due  . PNA vac Low Risk Adult (1 of 2 - PCV13) 12/28/1998  . INFLUENZA VACCINE  12/25/2017  . TETANUS/TDAP  08/10/2024  . DEXA SCAN  Completed  Cancer Screenings: Lung: Low Dose CT Chest recommended if Age 67-80 years, 30 pack-year currently smoking OR have quit w/in 15years. Patient does not qualify. Breast:  Up to date on Mammogram? Yes   Up to date of Bone Density/Dexa? Yes Colorectal: up to date  Additional Screenings:  Hepatitis C Screening:  Prevnar due: ordered    Plan:    I have personally reviewed and addressed the Medicare Annual Wellness questionnaire and have noted the following in the patient's chart:  A. Medical and social history B. Use of alcohol, tobacco or illicit drugs   C. Current medications and supplements D. Functional ability and status E.  Nutritional status F.  Physical activity G. Advance directives H. List of other physicians I.  Hospitalizations, surgeries, and ER visits in previous 12 months J.  [place] to include hearing, vision, cognitive, depression L. Referrals and appointments - none  In addition, I have reviewed and discussed with patient certain preventive protocols, quality metrics, and best practice recommendations. A written personalized care plan for preventive services as well as general preventive health recommendations were provided to patient.  See attached scanned questionnaire for additional information.   Signed,   Tyson Dense, RN Nurse Health Advisor  Patient Concerns: None

## 2018-02-05 DIAGNOSIS — Z23 Encounter for immunization: Secondary | ICD-10-CM | POA: Diagnosis not present

## 2018-02-05 DIAGNOSIS — M2012 Hallux valgus (acquired), left foot: Secondary | ICD-10-CM | POA: Diagnosis not present

## 2018-02-05 DIAGNOSIS — F0280 Dementia in other diseases classified elsewhere without behavioral disturbance: Secondary | ICD-10-CM | POA: Diagnosis not present

## 2018-02-05 DIAGNOSIS — R296 Repeated falls: Secondary | ICD-10-CM | POA: Diagnosis not present

## 2018-02-05 DIAGNOSIS — R260 Ataxic gait: Secondary | ICD-10-CM | POA: Diagnosis not present

## 2018-02-05 DIAGNOSIS — M2011 Hallux valgus (acquired), right foot: Secondary | ICD-10-CM | POA: Diagnosis not present

## 2018-02-05 DIAGNOSIS — G20 Parkinson's disease: Secondary | ICD-10-CM | POA: Diagnosis not present

## 2018-02-06 DIAGNOSIS — M2011 Hallux valgus (acquired), right foot: Secondary | ICD-10-CM | POA: Diagnosis not present

## 2018-02-06 DIAGNOSIS — M2012 Hallux valgus (acquired), left foot: Secondary | ICD-10-CM | POA: Diagnosis not present

## 2018-02-06 DIAGNOSIS — R260 Ataxic gait: Secondary | ICD-10-CM | POA: Diagnosis not present

## 2018-02-06 DIAGNOSIS — F0280 Dementia in other diseases classified elsewhere without behavioral disturbance: Secondary | ICD-10-CM | POA: Diagnosis not present

## 2018-02-06 DIAGNOSIS — R296 Repeated falls: Secondary | ICD-10-CM | POA: Diagnosis not present

## 2018-02-06 DIAGNOSIS — G20 Parkinson's disease: Secondary | ICD-10-CM | POA: Diagnosis not present

## 2018-02-09 DIAGNOSIS — M2011 Hallux valgus (acquired), right foot: Secondary | ICD-10-CM | POA: Diagnosis not present

## 2018-02-09 DIAGNOSIS — M2012 Hallux valgus (acquired), left foot: Secondary | ICD-10-CM | POA: Diagnosis not present

## 2018-02-09 DIAGNOSIS — G20 Parkinson's disease: Secondary | ICD-10-CM | POA: Diagnosis not present

## 2018-02-09 DIAGNOSIS — R296 Repeated falls: Secondary | ICD-10-CM | POA: Diagnosis not present

## 2018-02-09 DIAGNOSIS — F0280 Dementia in other diseases classified elsewhere without behavioral disturbance: Secondary | ICD-10-CM | POA: Diagnosis not present

## 2018-02-09 DIAGNOSIS — R260 Ataxic gait: Secondary | ICD-10-CM | POA: Diagnosis not present

## 2018-02-10 DIAGNOSIS — R296 Repeated falls: Secondary | ICD-10-CM | POA: Diagnosis not present

## 2018-02-10 DIAGNOSIS — G20 Parkinson's disease: Secondary | ICD-10-CM | POA: Diagnosis not present

## 2018-02-10 DIAGNOSIS — R260 Ataxic gait: Secondary | ICD-10-CM | POA: Diagnosis not present

## 2018-02-10 DIAGNOSIS — M2011 Hallux valgus (acquired), right foot: Secondary | ICD-10-CM | POA: Diagnosis not present

## 2018-02-10 DIAGNOSIS — F0280 Dementia in other diseases classified elsewhere without behavioral disturbance: Secondary | ICD-10-CM | POA: Diagnosis not present

## 2018-02-10 DIAGNOSIS — M2012 Hallux valgus (acquired), left foot: Secondary | ICD-10-CM | POA: Diagnosis not present

## 2018-02-11 DIAGNOSIS — G20 Parkinson's disease: Secondary | ICD-10-CM | POA: Diagnosis not present

## 2018-02-11 DIAGNOSIS — R296 Repeated falls: Secondary | ICD-10-CM | POA: Diagnosis not present

## 2018-02-11 DIAGNOSIS — M2011 Hallux valgus (acquired), right foot: Secondary | ICD-10-CM | POA: Diagnosis not present

## 2018-02-11 DIAGNOSIS — R260 Ataxic gait: Secondary | ICD-10-CM | POA: Diagnosis not present

## 2018-02-11 DIAGNOSIS — F0280 Dementia in other diseases classified elsewhere without behavioral disturbance: Secondary | ICD-10-CM | POA: Diagnosis not present

## 2018-02-11 DIAGNOSIS — M2012 Hallux valgus (acquired), left foot: Secondary | ICD-10-CM | POA: Diagnosis not present

## 2018-02-12 DIAGNOSIS — M25551 Pain in right hip: Secondary | ICD-10-CM | POA: Diagnosis not present

## 2018-02-17 DIAGNOSIS — R269 Unspecified abnormalities of gait and mobility: Secondary | ICD-10-CM | POA: Diagnosis not present

## 2018-02-17 DIAGNOSIS — D649 Anemia, unspecified: Secondary | ICD-10-CM | POA: Diagnosis not present

## 2018-02-17 LAB — CBC AND DIFFERENTIAL
HCT: 42 (ref 36–46)
Hemoglobin: 14.1 (ref 12.0–16.0)
Platelets: 183 (ref 150–399)
WBC: 5.9

## 2018-02-17 LAB — BASIC METABOLIC PANEL
BUN: 22 — AB (ref 4–21)
Creatinine: 0.9 (ref 0.5–1.1)
Glucose: 97
Potassium: 4.5 (ref 3.4–5.3)
Sodium: 141 (ref 137–147)

## 2018-02-18 ENCOUNTER — Non-Acute Institutional Stay: Payer: Medicare Other | Admitting: Internal Medicine

## 2018-02-18 ENCOUNTER — Encounter: Payer: Self-pay | Admitting: Internal Medicine

## 2018-02-18 VITALS — BP 120/80 | HR 68 | Temp 97.6°F | Ht 60.0 in | Wt 137.0 lb

## 2018-02-18 DIAGNOSIS — G20A1 Parkinson's disease without dyskinesia, without mention of fluctuations: Secondary | ICD-10-CM

## 2018-02-18 DIAGNOSIS — W19XXXA Unspecified fall, initial encounter: Secondary | ICD-10-CM

## 2018-02-18 DIAGNOSIS — F0280 Dementia in other diseases classified elsewhere without behavioral disturbance: Secondary | ICD-10-CM | POA: Diagnosis not present

## 2018-02-18 DIAGNOSIS — G20 Parkinson's disease: Secondary | ICD-10-CM | POA: Diagnosis not present

## 2018-02-18 DIAGNOSIS — G609 Hereditary and idiopathic neuropathy, unspecified: Secondary | ICD-10-CM

## 2018-02-18 NOTE — Progress Notes (Signed)
Location:  [place] of Service:  Clinic (12)  Provider: Verdia Bolt L. Mariea Clonts, D.O., C.M.D.  Code Status: DNR Goals of Care:  Advanced Directives 02/18/2018  Does Patient Have a Medical Advance Directive? Yes  Type of Paramedic of [place];Living will;Out of facility DNR (pink MOST or yellow form)  Does patient want to make changes to medical advance directive? No - Patient declined  Copy of [place] in Chart? Yes  Would patient like information on creating a medical advance directive? -  Pre-existing out of facility DNR order (yellow form or pink MOST form) Yellow form placed in chart (order not valid for inpatient use)     Chief Complaint  Patient presents with  . Acute Visit    fall    HPI: Patient is a 84 y.o. female with Parkinson's disease with dementia, Rosanna Randy syndrome, chronic constipation, left arm melanoma, peripheral neuropathy and osteoporosis seen today for an acute visit for a fall in her apt on 9/18--she reports she sat down, did not fall, but, of course, this counts.  She had three skin tears--right leg, left leg, bruising bilateral legs and elbows.  She sat on the couch and slid down.  She was found lying on her back after staff heard her yelling out.  She had xrays of her right hip due to complaints of pain and difficulty sitting on the right side.  She was still walking and doing her regular activities.  xrays were negative for fracture--showed only OA.  She is walking with her rollator w/o difficulty.  Her urine was dark and foul smelling suggestive of inadequate hydration.  Her daughter reported she also c/o burning.  She's been afebrile with normal vitals.  Yesterday, I was told about this and ordered cbc, bmp which have returned normal except slightly elevated BUN.  She reports all pain resolved and feeling well.    Past Medical History:  Diagnosis Date  . Aortic stenosis, mild   . Constipation, chronic   .  Gilbert syndrome   . HTN (hypertension)   . Hyperlipidemia   . Hypothyroidism   . Melanoma ([place])   . Neuropathy   . Osteoporosis   . Parkinson's disease ([place])   . Peripheral neuropathy    Bilateral  . Rheumatic fever    does not take Flu shot     Past Surgical History:  Procedure Laterality Date  . ABDOMINAL HYSTERECTOMY  1963   no BSO; no abnormal PAPs  . COLONOSCOPY  2000   negative; [place]  . [place]  . MELANOMA EXCISION  08/2014    Allergies  Allergen Reactions  . Sulfonamide Derivatives     REACTION:generalized  swelling  . Gabapentin Hypertension and Swelling  . Sulfa Antibiotics Swelling    Outpatient Encounter Medications as of 02/18/2018  Medication Sig  . acetaminophen (TYLENOL) 325 MG tablet Take 2 tablets (650 mg total) by mouth at bedtime as needed for mild pain.  Marland Kitchen acetaminophen (TYLENOL) 650 MG CR tablet Take 650 mg by mouth 2 (two) times daily.  Marland Kitchen amLODipine (NORVASC) 5 MG tablet Take 5 mg by mouth daily.  . Calcium Carbonate-Vitamin D (CALCIUM 600/VITAMIN D PO) Take 1 tablet by mouth daily.   . capsaicin (ZOSTRIX) 0.025 % cream Apply topically 2 (two) times daily.  . carbidopa-levodopa (SINEMET IR) 25-100 MG tablet 2 in the morning, 1 in the afternoon, 1 in the evening  . furosemide (LASIX) 20 MG tablet Take  10 mg by mouth as needed.   Marland Kitchen levothyroxine (SYNTHROID, LEVOTHROID) 50 MCG tablet Take one on Sunday, Monday, Wednesday, and Friday 75 mcg other days  . levothyroxine (SYNTHROID, LEVOTHROID) 75 MCG tablet Take 1 on Tuesday, Thursday, saturday  . magnesium citrate SOLN Take 1 Bottle by mouth daily as needed.   . Multiple Vitamin (MULTIVITAMIN) capsule Take 1 capsule by mouth daily.    Marland Kitchen omeprazole (PRILOSEC) 20 MG capsule Take 20 mg by mouth daily.   . potassium chloride (K-DUR) 10 MEQ tablet Take 10 mEq by mouth as needed. Use when taking Lasix  . [DISCONTINUED] Cholecalciferol (VITAMIN D) 2000 UNITS CAPS Take 1 capsule (2,000 Units  total) by mouth daily.   No facility-administered encounter medications on file as of 02/18/2018.     Review of Systems:  Review of Systems  Constitutional: Negative for chills, fever and malaise/fatigue.  HENT: Negative for congestion.   Eyes: Negative for blurred vision.       Not wearing her glasses today  Respiratory: Negative for cough and shortness of breath.   Cardiovascular: Negative for chest pain, palpitations and leg swelling.  Gastrointestinal: Positive for constipation. Negative for abdominal pain, blood in stool, diarrhea and melena.  Genitourinary: Negative for dysuria.  Musculoskeletal: Positive for falls. Negative for back pain, joint pain, myalgias and neck pain.  Skin:       Skin tears and bruising per hpi  Neurological: Negative for dizziness and loss of consciousness.       Shuffling gait, festination, freezing with turns  Psychiatric/Behavioral: Positive for memory loss. Negative for depression. The patient is not nervous/anxious and does not have insomnia.     Health Maintenance  Topic Date Due  . PNA vac Low Risk Adult (1 of 2 - PCV13) 12/28/1998  . INFLUENZA VACCINE  12/25/2017  . TETANUS/TDAP  08/10/2024  . DEXA SCAN  Completed    Physical Exam: Vitals:   02/18/18 0840  BP: 120/80  Pulse: 68  Temp: 97.6 F (36.4 C)  TempSrc: Oral  SpO2: 97%  Weight: 137 lb (62.1 kg)   Body mass index is 26.76 kg/m. Physical Exam  Constitutional: She appears well-developed. No distress.  HENT:  Head: Normocephalic and atraumatic.  Cardiovascular: Normal rate, regular rhythm and intact distal pulses.  Murmur heard. 2/6 systolic murmur audible throughout precordium  Pulmonary/Chest: Effort normal and breath sounds normal.  Abdominal: Bowel sounds are normal.  Musculoskeletal: Normal range of motion.  Walks very slowly with shuffling gait with rollator walker  Neurological: She is alert. A sensory deficit is present. She exhibits abnormal muscle tone.    Oriented to person; thinks she lives somewhere else not in this same building  Skin: Skin is warm and dry.  Right leg with lateral abrasion with dressing intact, some surrounding bruising, but no erythema or warmth  Psychiatric: She has a normal mood and affect.    Labs reviewed: Basic Metabolic Panel: Recent Labs    02/17/18 0259  NA 141  K 4.5  BUN 22*  CREATININE 0.9   Liver Function Tests: No results for input(s): AST, ALT, ALKPHOS, BILITOT, PROT, ALBUMIN in the last 8760 hours. No results for input(s): LIPASE, AMYLASE in the last 8760 hours. No results for input(s): AMMONIA in the last 8760 hours. CBC: Recent Labs    02/17/18 0259  WBC 5.9  HGB 14.1  HCT 42  PLT 183   Assessment/Plan 1. Fall, initial encounter -occurred last week -pt had some right hip pain, but imaging  negative -staff have been pushing fluids due to dark, foul urine -slight elevation bun, but cbc and bmp otherwise normal -she reports she'd done too much and was fatigued so she was unable to get back up after sitting on the floor to look at books on her bookshelf, she slid down to the floor and couldn't get up on her own -story is different from what staff have documented, but pt has dementia and is poor historian and this occurred last week -she is experimenting with new shoewear for her hammertoes and toe deformities plus neuropathy -she is high fall risk due to her PD -reports she always uses the rollator walker -has had recent PT, OT -continues to walk for exercise  2. Parkinson's disease ([place]) -ongoing, cont current sinemet regimen; bowel regimen for constipation which is just mag citrate it appears  3. Hereditary and idiopathic peripheral neuropathy -ongoing issue contributory to falls -worked with OT to get best shoewear -really needs enhanced AL at least for best support  4. 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  Explained that studies have been done since that indicate she does need to take her pneumonia and flu vaccines b/c she is higher risk due to her valvular disease and living in a retirement community.  She listened and said she'll think about it.  Unfortunately, she's unlikely to recall this discussion later.  I asked her to get her daughter's opinion on this.  Pt really needs her vaccines.  Labs/tests ordered:  No new Next appt:  04/15/2018--keep as scheduled Marcine Gadway L. Leeasia Secrist, D.O. [facility] [address], [place] [address] Cell Phone (Mon-Fri 8am-5pm):  [phone] On Call:  [phone] & follow prompts after 5pm & weekends Office Phone:  [phone] Office Fax:  [phone]

## 2018-02-18 NOTE — Addendum Note (Signed)
Addended by: Hollace Kinnier L on: 02/18/2018 11:35 AM   Modules accepted: Level of Service

## 2018-02-26 DIAGNOSIS — N390 Urinary tract infection, site not specified: Secondary | ICD-10-CM | POA: Diagnosis not present

## 2018-02-26 DIAGNOSIS — R319 Hematuria, unspecified: Secondary | ICD-10-CM | POA: Diagnosis not present

## 2018-03-10 DIAGNOSIS — L570 Actinic keratosis: Secondary | ICD-10-CM | POA: Diagnosis not present

## 2018-03-10 DIAGNOSIS — Z85820 Personal history of malignant melanoma of skin: Secondary | ICD-10-CM | POA: Diagnosis not present

## 2018-03-10 DIAGNOSIS — D1801 Hemangioma of skin and subcutaneous tissue: Secondary | ICD-10-CM | POA: Diagnosis not present

## 2018-03-10 DIAGNOSIS — Z85828 Personal history of other malignant neoplasm of skin: Secondary | ICD-10-CM | POA: Diagnosis not present

## 2018-03-10 DIAGNOSIS — L814 Other melanin hyperpigmentation: Secondary | ICD-10-CM | POA: Diagnosis not present

## 2018-03-10 DIAGNOSIS — L821 Other seborrheic keratosis: Secondary | ICD-10-CM | POA: Diagnosis not present

## 2018-03-16 ENCOUNTER — Ambulatory Visit: Payer: Medicare Other | Admitting: Neurology

## 2018-03-16 ENCOUNTER — Encounter

## 2018-03-18 ENCOUNTER — Encounter: Payer: Self-pay | Admitting: Internal Medicine

## 2018-03-18 ENCOUNTER — Ambulatory Visit: Payer: Medicare Other | Admitting: Sports Medicine

## 2018-03-18 ENCOUNTER — Non-Acute Institutional Stay: Payer: Medicare Other | Admitting: Internal Medicine

## 2018-03-18 VITALS — BP 128/70 | HR 60 | Temp 97.8°F | Ht 60.0 in | Wt 134.0 lb

## 2018-03-18 DIAGNOSIS — G20A1 Parkinson's disease without dyskinesia, without mention of fluctuations: Secondary | ICD-10-CM

## 2018-03-18 DIAGNOSIS — G20 Parkinson's disease: Secondary | ICD-10-CM

## 2018-03-18 DIAGNOSIS — G609 Hereditary and idiopathic neuropathy, unspecified: Secondary | ICD-10-CM

## 2018-03-18 DIAGNOSIS — F0280 Dementia in other diseases classified elsewhere without behavioral disturbance: Secondary | ICD-10-CM | POA: Diagnosis not present

## 2018-03-18 DIAGNOSIS — Z85820 Personal history of malignant melanoma of skin: Secondary | ICD-10-CM

## 2018-03-18 DIAGNOSIS — I872 Venous insufficiency (chronic) (peripheral): Secondary | ICD-10-CM | POA: Diagnosis not present

## 2018-03-18 DIAGNOSIS — F22 Delusional disorders: Secondary | ICD-10-CM

## 2018-03-18 NOTE — Progress Notes (Signed)
Location:  [facility] of Service:  Clinic (12)  Provider: Fredis Malkiewicz L. Mariea Clonts, D.O., C.M.D.  Code Status: DNR Goals of Care:  Advanced Directives 03/18/2018  Does Patient Have a Medical Advance Directive? Yes  Type of Paramedic of [place];Living will;Out of facility DNR (pink MOST or yellow form)  Does patient want to make changes to medical advance directive? No - Patient declined  Copy of [place] in Chart? Yes  Would patient like information on creating a medical advance directive? No - Patient declined  Pre-existing out of facility DNR order (yellow form or pink MOST form) Yellow form placed in chart (order not valid for inpatient use)   Chief Complaint  Patient presents with  . Acute Visit    increased confusion and sleep concerns    HPI: Patient is a 84 y.o. female seen today for an acute visit for increased confusion and sleep concerns.  When she lies down at night, she relives the past.  If she had bad dreams, it would bother her.  She's apparently leaving her room.  The staff check on her a few times each night.  She does not recall calling Tonya--called at 2am and wanted her to get her money.  She will wake from dreams.  She admits being concerned about things.  She says her daughter buys her too much clothes.  MMSE has declined to 19/30.  She worries about whether she has money. She is thinking things are happening but they are not like pt thought she was washing her clothes or sending them to the dry cleaners, but she was not.  She worries that she does not want to ask staff to do things for her b/c they'll make her move out.  She apparently does not have any washing detergent.  Only has dial soap.  Says insurance requires certain things of her??? Has long-term care for life insurance.  Says staff keep a record of extra things they do for her.  She is located at the end of the hall.  She can see security outside watching her  with her blinds open.  They have great security.  She sees their lights.  Discussed the idea of "more company" to help her to do things.  Says the times for breakfast gets changed (she goes at the wrong time).  A care plan meeting was held and increased level of care was recommended.  Spoke with social work and advised that there are currently no beds in SNF at this time.  I advised that home care hours are needed temporarily until a bed opens for her.  Past Medical History:  Diagnosis Date  . Aortic stenosis, mild   . Constipation, chronic   . Gilbert syndrome   . HTN (hypertension)   . Hyperlipidemia   . Hypothyroidism   . Melanoma ([place])   . Neuropathy   . Osteoporosis   . Parkinson's disease ([place])   . Peripheral neuropathy    Bilateral  . Rheumatic fever    does not take Flu shot     Past Surgical History:  Procedure Laterality Date  . ABDOMINAL HYSTERECTOMY  1963   no BSO; no abnormal PAPs  . COLONOSCOPY  2000   negative; [place]  . [place]  . MELANOMA EXCISION  08/2014    Allergies  Allergen Reactions  . Sulfonamide Derivatives     REACTION:generalized  swelling  . Gabapentin Hypertension and Swelling  .  Sulfa Antibiotics Swelling    Outpatient Encounter Medications as of 03/18/2018  Medication Sig  . acetaminophen (TYLENOL) 325 MG tablet Take 2 tablets (650 mg total) by mouth at bedtime as needed for mild pain.  Marland Kitchen amLODipine (NORVASC) 5 MG tablet Take 5 mg by mouth daily.  . Calcium Carbonate-Vitamin D (CALCIUM 600/VITAMIN D PO) Take 1 tablet by mouth daily.   . capsaicin (ZOSTRIX) 0.025 % cream Apply topically 2 (two) times daily.  . carbidopa-levodopa (SINEMET IR) 25-100 MG tablet 2 in the morning, 1 in the afternoon, 1 in the evening  . Cholecalciferol (VITAMIN D) 2000 units CAPS Take 1 capsule by mouth daily.  . furosemide (LASIX) 20 MG tablet Take 10 mg by mouth as needed.   Marland Kitchen levothyroxine (SYNTHROID, LEVOTHROID) 50 MCG tablet Take one on  Sunday, Monday, Wednesday, and Friday 75 mcg other days  . levothyroxine (SYNTHROID, LEVOTHROID) 75 MCG tablet Take 1 on Tuesday, Thursday, saturday  . magnesium citrate SOLN Take 1 Bottle by mouth daily as needed.   . Multiple Vitamin (MULTIVITAMIN) capsule Take 1 capsule by mouth daily.    Marland Kitchen omeprazole (PRILOSEC) 20 MG capsule Take 20 mg by mouth daily.   . potassium chloride (K-DUR) 10 MEQ tablet Take 10 mEq by mouth as needed. Use when taking Lasix   No facility-administered encounter medications on file as of 03/18/2018.     Review of Systems:  Review of Systems  Constitutional: Negative for chills, fever, malaise/fatigue and weight loss.  HENT: Negative for congestion.   Eyes: Negative for blurred vision.       Glasses  Respiratory: Negative for cough and shortness of breath.   Cardiovascular: Positive for leg swelling. Negative for chest pain and palpitations.       Wears compression hose (had issues with washing them)  Gastrointestinal: Negative for abdominal pain, blood in stool, constipation and melena.  Genitourinary: Negative for dysuria.  Musculoskeletal: Positive for falls. Negative for joint pain and myalgias.  Skin: Negative for itching and rash.  Neurological: Positive for tremors. Negative for dizziness, loss of consciousness and weakness.  Endo/Heme/Allergies: Does not bruise/bleed easily.  Psychiatric/Behavioral: Positive for hallucinations and memory loss. Negative for depression. The patient has insomnia. The patient is not nervous/anxious.        Paranoid delusions per family and nursing staff    Health Maintenance  Topic Date Due  . TETANUS/TDAP  08/10/2024  . DEXA SCAN  Completed  . INFLUENZA VACCINE  Discontinued  . PNA vac Low Risk Adult  Discontinued    Physical Exam: Vitals:   03/18/18 0857  BP: 128/70  Pulse: 60  Temp: 97.8 F (36.6 C)  TempSrc: Oral  SpO2: 95%  Weight: 134 lb (60.8 kg)  Height: 5' (1.524 m)   Body mass index is 26.17  kg/m. Physical Exam  Constitutional: She is oriented to person, place, and time. She appears well-developed and well-nourished. No distress.  HENT:  Head: Normocephalic and atraumatic.  Cardiovascular: Normal rate, regular rhythm and intact distal pulses.  Murmur heard. Pulmonary/Chest: Effort normal and breath sounds normal. No respiratory distress.  Abdominal: Bowel sounds are normal.  Musculoskeletal: Normal range of motion. She exhibits no tenderness.  Neurological: She is alert and oriented to person, place, and time.  Skin: Skin is warm and dry.  Psychiatric: She has a normal mood and affect. Her behavior is normal. Judgment and thought content normal.    Labs reviewed: Basic Metabolic Panel: Recent Labs    02/17/18 0259  NA 141  K 4.5  BUN 22*  CREATININE 0.9   Liver Function Tests: No results for input(s): AST, ALT, ALKPHOS, BILITOT, PROT, ALBUMIN in the last 8760 hours. No results for input(s): LIPASE, AMYLASE in the last 8760 hours. No results for input(s): AMMONIA in the last 8760 hours. CBC: Recent Labs    02/17/18 0259  WBC 5.9  HGB 14.1  HCT 42  PLT 183   Assessment/Plan 1. 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 Parkinson's disease ([place]) -slowly progressive with slower and slower mobility, freezing, festinating, falls -cont current sinemet -already having psychosis so would avoid increased sinemet  4. Hereditary and idiopathic peripheral neuropathy -ongoing, and not helping her gait and risk of cellulitis/wounds on her legs/feet (unable to feel lateral aspects)  5. Chronic venous insufficiency -ongoing, continue compression hose, but they require regular washing to prevent tinea and cellulitis from developing--pt is NOT doing this on her own and laundry needs to be done  6. History of melanoma -just saw derm in f/u for skin exam which was negative for concerning lesions of this variety  Labs/tests ordered:  No new Next appt:  04/15/2018  Jamillia Closson L. Eddie Koc, D.O. [facility] [address], [place] [address] Cell Phone (Mon-Fri 8am-5pm):  [phone] On Call:  [phone] & follow prompts after 5pm & weekends Office Phone:  [phone] Office Fax:  [phone]

## 2018-03-24 DIAGNOSIS — L603 Nail dystrophy: Secondary | ICD-10-CM | POA: Diagnosis not present

## 2018-04-11 DIAGNOSIS — R7989 Other specified abnormal findings of blood chemistry: Secondary | ICD-10-CM | POA: Diagnosis not present

## 2018-04-11 DIAGNOSIS — E039 Hypothyroidism, unspecified: Secondary | ICD-10-CM | POA: Diagnosis not present

## 2018-04-11 LAB — CBC AND DIFFERENTIAL
HCT: 43 (ref 36–46)
Hemoglobin: 14.3 (ref 12.0–16.0)
Platelets: 170 (ref 150–399)
WBC: 5.2

## 2018-04-11 LAB — HEPATIC FUNCTION PANEL
ALT: 5 — AB (ref 7–35)
AST: 21 (ref 13–35)
Alkaline Phosphatase: 56 (ref 25–125)
Bilirubin, Total: 0.8

## 2018-04-11 LAB — BASIC METABOLIC PANEL
BUN: 20 (ref 4–21)
Creatinine: 0.7 (ref 0.5–1.1)
Glucose: 103
Potassium: 4.3 (ref 3.4–5.3)
Sodium: 144 (ref 137–147)

## 2018-04-11 LAB — TSH: TSH: 5.83 (ref 0.41–5.90)

## 2018-04-13 ENCOUNTER — Encounter (INDEPENDENT_AMBULATORY_CARE_PROVIDER_SITE_OTHER): Payer: Self-pay | Admitting: Orthopedic Surgery

## 2018-04-13 ENCOUNTER — Ambulatory Visit (INDEPENDENT_AMBULATORY_CARE_PROVIDER_SITE_OTHER): Payer: Medicare Other | Admitting: Orthopedic Surgery

## 2018-04-13 ENCOUNTER — Telehealth (INDEPENDENT_AMBULATORY_CARE_PROVIDER_SITE_OTHER): Payer: Self-pay | Admitting: Orthopedic Surgery

## 2018-04-13 VITALS — Ht 60.0 in | Wt 134.0 lb

## 2018-04-13 DIAGNOSIS — M6702 Short Achilles tendon (acquired), left ankle: Secondary | ICD-10-CM

## 2018-04-13 DIAGNOSIS — M6701 Short Achilles tendon (acquired), right ankle: Secondary | ICD-10-CM

## 2018-04-13 DIAGNOSIS — I87323 Chronic venous hypertension (idiopathic) with inflammation of bilateral lower extremity: Secondary | ICD-10-CM | POA: Diagnosis not present

## 2018-04-13 NOTE — Telephone Encounter (Signed)
Danielle from [facility] left a message requesting clarification on the orders for achilles stretching and compression hose.  CB#[phone].  Thank you.

## 2018-04-13 NOTE — Progress Notes (Signed)
Office Visit Note   Patient: Natalie Lam           Date of Birth: 1931-06-04           MRN: [id number] Visit Date: 04/13/2018              Requested by: Gayland Curry, DO [place], [place] [address] PCP: Gayland Curry, DO  Chief Complaint  Patient presents with  . Left Foot - Pain      HPI: Patient is an 84 year old woman who was seen with her family she is a resident at [facility].  Patient has had recurrent ingrown toenail issues with her left third toe.  Patient is undergone spacers and conservative therapy and family is inquiring whether a second toe amputation would be helpful.  Patient denies any skin breakdown.  She does have chronic venous insufficiency and has been wearing Ted compression stockings.  Assessment & Plan: Visit Diagnoses:  1. Achilles tendon contracture, bilateral   2. Idiopathic chronic venous hypertension of both lower extremities with inflammation     Plan: Orders were written for patient to work on [facility] stretching at [facility] recommended walking for exercise to help the calf pump recommended Vive compression stockings and recommended [place] running sneakers to unload the forefoot.  Follow-Up Instructions: Return if symptoms worsen or fail to improve.   Ortho Exam  Patient is alert, oriented, no adenopathy, well-dressed, normal affect, normal respiratory effort. Examination patient has a good dorsalis pedis pulse bilaterally.  She does have venous insufficiency with brawny skin color changes and varicose veins but no open ulcers.  Patient has significant heel cord contracture with dorsiflexion about 30 degrees short of neutral with knee extended.  This is flexible I can improve with dorsiflexion.  She has hallux valgus bilaterally causing crowding of her toes but there is no overlapping of the toes there is no ulcers there is some mild maceration between the toes without ulcerations.  Imaging: No results found. No images  are attached to the encounter.  Labs: Lab Results  Component Value Date   REPTSTATUS 04/02/2016 FINAL 04/01/2016   CULT NO GROWTH Performed at [facility]  04/01/2016     Lab Results  Component Value Date   ALBUMIN 3.9 12/04/2016   ALBUMIN 3.7 03/07/2016   ALBUMIN 3.7 01/24/2016    Body mass index is 26.17 kg/m.  Orders:  No orders of the defined types were placed in this encounter.  No orders of the defined types were placed in this encounter.    Procedures: No procedures performed  Clinical Data: No additional findings.  ROS:  All other systems negative, except as noted in the HPI. Review of Systems  Objective: Vital Signs: Ht 5' (1.524 m)   Wt 134 lb (60.8 kg)   BMI 26.17 kg/m   Specialty Comments:  No specialty comments available.  PMFS History: Patient Active Problem List   Diagnosis Date Noted  . Chronic constipation 12/10/2017  . 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 Parkinson's disease ([place]) 09/13/2015  . Mild cognitive impairment with memory loss 04/22/2015  . Other fatigue 04/22/2015  . Urge urinary incontinence 04/22/2015  . Unsteady gait 04/22/2015  . Chronic venous insufficiency 04/22/2015  . Hereditary and idiopathic peripheral neuropathy 04/22/2015  . Thoracic compression fracture ([place]) 01/11/2015  . History of melanoma 12/09/2014  . Torticollis 12/09/2014  . Bilateral carotid bruits 08/18/2014  . Aortic valve stenosis 08/18/2014  . Syncope 08/11/2014  .  Gilbert's syndrome 08/11/2014  . Heart murmur, systolic 98/47/3085  . HTN (hypertension) 06/25/2013  . Skin cancer 07/14/2012  . Abnormal serum level of alkaline phosphatase 04/16/2012  . Esophageal reflux 06/27/2010  . THYROID NODULE, LEFT 04/18/2009  . Vitamin D deficiency 10/01/2007  . Hypothyroidism 08/21/2007  . HLD (hyperlipidemia) 08/21/2007  . Varicose veins of both lower extremities 08/21/2007  . Senile osteoporosis 08/21/2007    Past Medical History:  Diagnosis Date  . Aortic stenosis, mild   . Constipation, chronic   . Gilbert syndrome   . HTN (hypertension)   . Hyperlipidemia   . Hypothyroidism   . Melanoma ([place])   . Neuropathy   . Osteoporosis   . Parkinson's disease ([place])   . Peripheral neuropathy    Bilateral  . Rheumatic fever    does not take Flu shot     Family History  Problem Relation Age of Onset  . Dementia Mother        Alzheimers  . Stroke Mother        ? 87  . Cancer Son        multiple myeloma  . Pancreatic cancer Son   . Heart disease Brother   . Lung cancer Maternal Uncle        smoker  . Heart attack Neg Hx   . Diabetes Neg Hx   . Hearing loss Neg Hx     Past Surgical History:  Procedure Laterality Date  . ABDOMINAL HYSTERECTOMY  1963   no BSO; no abnormal PAPs  . COLONOSCOPY  2000   negative; [place]  . [place]  . MELANOMA EXCISION  08/2014   Social History   Occupational History  . Occupation: retired own [employer]  . Smoking status: Never Smoker  . Smokeless tobacco: Never Used  Substance and Sexual Activity  . Alcohol use: No  . Drug use: No  . Sexual activity: Not on file

## 2018-04-13 NOTE — Telephone Encounter (Signed)
I called and lm on vm to advise that the pt has achilles contracture and needs to stretch her achilles. She should start a walking program for calf pump and compression sock. Also try [place] shoe to off load forefoot and call if she does not improve. Can call with questions.

## 2018-04-14 ENCOUNTER — Encounter: Payer: Self-pay | Admitting: Internal Medicine

## 2018-04-14 NOTE — Telephone Encounter (Signed)
Andee Poles called again with a different number for call back, she is requesting clarification on orders for compression hose and if they should d/c wrapping one of her legs? Also if patient should have therapy consultation for achilles stretching or if she was taught in the office. Please call Danielle back # [phone]

## 2018-04-14 NOTE — Telephone Encounter (Signed)
I called and [facility] nurse to advise that they could let the pt wear compression socks on both legs order was written for this yesterday and that she could have physical therapy work with her on achilles stretching this would be ok. To call with any other questions.

## 2018-04-15 ENCOUNTER — Encounter: Payer: Medicare Other | Admitting: Internal Medicine

## 2018-05-08 NOTE — Progress Notes (Signed)
Natalie Lam was seen today in the movement disorders clinic for neurologic consultation at the request of [name], [name], DO.  The patient is accompanied by her daughter who supplements the history.  Prior records made available to me were reviewed.  The patient has a history of gait instability.  Pts sx's started in March, 2016.  Pt thinks that it started quickly.  She remembers one particular day in March that she went to [facility] and she backed into the space and she realized that she tried to walk into [employer] and just felt that she would fall if she didn't get back in the car.  She had a near syncopal episode which was later determined due to HTN and mild AS.  After that, she seemed to go slowly down hill.  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  She had PT in May which helped her back but didn't really seem to help the gait.  Pt is getting ready to move to [facility] assisted living.  Pt states that she just has a "fear of falling because so many people my age do."  Admits that depression contributes to sx's but states that doing better than she was.  Pt denies lateralizing weakness/paresthesias.  Denies speech change but states that she "tires easily."  Denies feet paresthesias.      Specific Symptoms:  Tremor: No. Voice: denies hypophonic speech Sleep: sleeps well  Vivid Dreams:  Yes.   (only if eats at night)  Acting out dreams:  No. Wet Pillows: No. Postural symptoms:  Yes.   (no cane/walker to ambulate)  Falls?  No. Bradykinesia symptoms: slow movements Loss of smell:  No. Loss of taste:  No. Urinary Incontinence:  No. Difficulty Swallowing:  No. Handwriting, micrographia: No. (perhaps a little smaller) Trouble with ADL's:  No.  Trouble buttoning clothing: No. Depression:  Yes.   (multiple deaths in family) Memory changes:  Yes.   (but complicated by  deaths, moving, closing family business; still drives in familiar territory and parks far away to avoid other cars; pays monthly bills without issue; cooks for self until moves to [facility]) Hallucinations:  No.  visual distortions: No. N/V:  No. Lightheaded:  No.   Neuroimaging has previously been performed.  It is  available for my review today.  A CT of the brain was performed on 08/12/2014 after near syncopal episode.  I reviewed this.  There is atrophy and rather significant cervical small vessel disease.  A carotid ultrasound was performed on 09/06/2014.  This demonstrated 1-49% stenosis bilaterally.  09/13/15 update:  The patient is following up today, accompanied by her daughter who supplements the history.  I have not seen her since July, 2016 and that was only a one time visit.  She does have a history of peripheral neuropathy, which has led to gait instability.  Her peripheral neuropathy has also caused paresthesias in the feet and according to records she was started on gabapentin by the nurse practitioner at [facility] in October, 2016.  She states that she is f/u due to trouble walking for about 6 weeks.  Her daughter is a Marine scientist and agrees that the majority of the issue started about 6 weeks ago, with no new med changes.  Pt states that she is getting stuck in doorways.  "Once I get going, I am okay."  11/24/15 update:  Pt  is f/u today re: newly dx PD last visit. This patient is accompanied in the office by her child who supplements the history.  Reviewed records since last visit.  On levodopa tid now.  Tolerating it well.  Feels that she is doing much better.  States that she feels stronger in the legs and "I don't have to work to get across the doorway."  No hallucinations.  No lightheaded/near syncope.  No falls since last visit.  Did PT/OT at [facility] and feels it helped.  Uses rollator.    02/26/16 update:  The patient follows up regarding her Parkinson's disease.  She remains on  carbidopa/levodopa 25/100, one tablet 3 times per day.  She reports that she has been doing well.  No falls.  No lightheadedness or near syncope.  She is using her Rollator faithfully but "with my wheels I can travel."  She did go to the emergency room with abdominal pain, which was ultimately felt due to constipation.  She is taking MiraLAX but doesn't use it daily.  She is still having trouble balancing constipation with diarrhea.  Daughter that Natalie Lam was started almost a month ago.  She is on 6mg once per day.  Because of intermittent diarrhea, she has cut back on exercise some.  Lives in assisted living and gets assist with medication, bath, dressing (putting on hose especially).  Meals prepared.  [facility]    06/20/16 update:  Patient follows up today, on carbidopa/levodopa 25/100, 1 tablet 3 times a day.  She is accompanied by her granddaughter who supplements the history.  [facility] moved from [facility] to [facility] since our last visit.  When she moved, her levodopa was decreased from 1-1/2 tablets to 1 tablet 3 times per day.  Her daughter ended up calling me about this, and I left it at that dose because her daughter also stated that she was having hallucinations.  It was unclear if these were actual hallucinations, or if these were arising out of sleep.  She followed up with her primary care physician.  A urinalysis was unremarkable.  Denies any hallucination or visual distortions today.  Daughter states having paranoia.  Not sleeping well because she worries that there are things going on outside her room (pt denies this but daughter relates that).  daughter relates that if door closes she becomes hyperfocused on why it closed.  Pt relates that it is much better now as she has gotten used to the noises of the new facility.  Working with PT.  Daughter concerned that PT goal is to get her off of the walker.  11/19/16 update:  Patient seen today in follow-up.  Patient is accompanied by  her daughter who supplements the history.  She remains on carbidopa/levodopa 25/100, one tablet 3 times per day.  The records that were made available to me were reviewed.  [facility]  Daughter states that she is in enhanced assisted living, costing $11,000-$12,000 per month.  She is getting assist with meds, bathing, dressing, meals but daughter doesn't think that she needs the enhanced part of assisted living.  Her daughter states that the only difference between regular and enhanced assisted living is the CNA level (1:12 vs 1:4-6 patients).  No falls except states that she slid off toilet once when trying to put on panties.      12/09/16 update:  Patient seen as a work in today.  Records were reviewed.  Accompanied by  daughter who supplements the hx.   Last visit, her daughter had asked me to write a letter stating that she no longer needed in enhanced assisted living, because the patient was doing so well.  However, that is not the case.  The patient just went to the emergency room 2 days ago with strange behavior.  She had apparently disassembled a collage picture frame and placed Post-it notes over the wall stating "do not post on facebook."  Pt states today that she took down a photo frame to put up new photos and put up post up notes stating where she would want new photos to go.  Denies that the post its had anything on them. Pts daughter state that agitation/lack of sleep/insomnia happened the night before.  she has had some day/night reversal.   There were no hallucinations.  She had no urinary tract infection and was noted to have no abnormal behavior in the emergency room.  Her daughter told the emergency room physician that strange behavior have been going on for 2 weeks and she states today that perhaps it has been going on for a week.  Pts blood pressure has been intermittently quite elevated over the last few weeks (2 episodes per daughter).  Her CT of the brain  in the emergency room was nonacute.  No labs were done in the emergency room except a urinalysis.  Daughter states that pt has "lost about half of ability to walk" but unsure why.  She is weak.  Seems mostly due to fatigue.  She has PT ordered but not yet started.     05/09/17 update: Patient is seen in follow-up today, accompanied by her daughter who supplements the history.  She had an MRI of the brain in July, after our last visit.  I have reviewed that MRI of the brain.  There was severe small vessel disease.  There is significant atrophy.  There is evidence of chronic microhemorrhages.  There was nothing acute.  She is still in physical therapy and she reports that she is doing well.  "It makes you feel better when you do it."   She remains on carbidopa/levodopa 25/100, 1 tablet 3 times per day.  She denies falls and then tells me one time "she swirled to the floor" and one time "she slid off of the commode."  Her daughter describes memory changes - "her cognitive decline is significant."  Discussed adding melatonin last visit and pt denies she is on it but daughter states that she did try it but she didn't want it any longer.  Pt states that she now remembers that "it was running her BP up."  She is sleeping well and "I need my sleep and my exercise."  No hallucinations.  She denies confusion.  States that she "stays busy" at [facility] but she cannot tell me what she does.  11/14/17 update:  Pt seen in f/u for PD.  This patient is accompanied in the office by her daughter who supplements the history.  Pt is on carbidopa/levodopa 25/100 tid.  No falls.  "I keep busy doing something all the time."  Can't tell me specifics.  Goes to meals at the dining room and "the food is delicious."  No lightheadedness.  "I am really good and Lavella Lemons (daughter) can tell you too."  Daughter brings a list of complaints including "confabulation, delusions of persecution."   Daughter complains that pt needs lift chair but  won't use.  Pt asks me about  leaving [facility] and living completely independently, "just occasionally."  Haven't done PT in a long time but daughter states that [facility] won't do it without her being present.  Daughter states that staff at [facility] don't see/understand the things that the daughter sees.  They just had a care plan meeting last week.  05/11/18 update: Patient is seen today in follow-up for Parkinson's disease, accompanied by her daughter who supplements the history.  Last visit, I slightly increased her carbidopa/levodopa.  She is now on 2 tablets in the morning, 1 in the afternoon and 1 in the evening. No lightheadedness or near syncope.  One of the challenges with her has been the differences in what is reported at the visit by family and what is seen.  Last visit, her daughter stated that [facility] will do physical therapy without her daughter being present.  We called [facility] and we were told that they did not want a family member present because it was distracting to the patient.  They also stated that they were willing to do physical therapy with an order, which was provided after our last visit and after talking to the physical therapist at [facility].  Daughter states that pt wouldn't ever do therapy because of foot pain due to hammer toes.  Patient and daughter tell me about an incident yesterday at [facility] (patient tells me about it multiple times throughout the visit).  Apparently, the phones were out yesterday and the patient became quite bored.  She took the lights off the Christmas tree in her room in hopes that she could find tinsel for the tree.  Her daughter was upset about this.  Daughter states that she is not sleeping well at night.  She sleeps well when she is given Tylenol.  Otherwise, she wakes up about 2 AM and walks the halls and then ultimately goes back to bed and takes a nap about 10 AM in the morning.  ALLERGIES:   Allergies  Allergen Reactions  .  Sulfonamide Derivatives     REACTION:generalized  swelling  . Gabapentin Hypertension and Swelling  . Sulfa Antibiotics Swelling    CURRENT MEDICATIONS:  Outpatient Encounter Medications as of 05/11/2018  Medication Sig  . acetaminophen (TYLENOL) 325 MG tablet Take 2 tablets (650 mg total) by mouth at bedtime as needed for mild pain.  Marland Kitchen amLODipine (NORVASC) 5 MG tablet Take 5 mg by mouth daily.  . Calcium Carbonate-Vitamin D (CALCIUM 600/VITAMIN D PO) Take 1 tablet by mouth daily.   . capsaicin (ZOSTRIX) 0.025 % cream Apply topically 2 (two) times daily.  . carbidopa-levodopa (SINEMET IR) 25-100 MG tablet 2 in the morning, 1 in the afternoon, 1 in the evening  . Cholecalciferol (VITAMIN D) 2000 units CAPS Take 1 capsule by mouth daily.  . furosemide (LASIX) 20 MG tablet Take 10 mg by mouth as needed.   Marland Kitchen levothyroxine (SYNTHROID, LEVOTHROID) 50 MCG tablet Take one on Sunday, Monday, Wednesday, and Friday 75 mcg other days  . levothyroxine (SYNTHROID, LEVOTHROID) 75 MCG tablet Take 1 on Tuesday, Thursday, saturday  . magnesium citrate SOLN Take 1 Bottle by mouth daily as needed.   . Multiple Vitamin (MULTIVITAMIN) capsule Take 1 capsule by mouth daily.    Marland Kitchen omeprazole (PRILOSEC) 20 MG capsule Take 20 mg by mouth daily.   . potassium chloride (K-DUR) 10 MEQ tablet Take 10 mEq by mouth as needed. Use when taking Lasix   No facility-administered encounter medications on file as of 05/11/2018.  PAST MEDICAL HISTORY:   Past Medical History:  Diagnosis Date  . Aortic stenosis, mild   . Constipation, chronic   . Gilbert syndrome   . HTN (hypertension)   . Hyperlipidemia   . Hypothyroidism   . Melanoma ([place])   . Neuropathy   . Osteoporosis   . Parkinson's disease ([place])   . Peripheral neuropathy    Bilateral  . Rheumatic fever    does not take Flu shot     PAST SURGICAL HISTORY:   Past Surgical History:  Procedure Laterality Date  . ABDOMINAL HYSTERECTOMY  1963   no BSO; no  abnormal PAPs  . COLONOSCOPY  2000   negative; [place]  . [place]  . MELANOMA EXCISION  08/2014    SOCIAL HISTORY:   Social History   Socioeconomic History  . Marital status: Widowed    Spouse name: Not on file  . Number of children: Not on file  . Years of education: Not on file  . Highest education level: Not on file  Occupational History  . Occupation: retired own [employer]  . Financial resource strain: Not hard at all  . Food insecurity:    Worry: Never true    Inability: Never true  . Transportation needs:    Medical: No    Non-medical: No  Tobacco Use  . Smoking status: Never Smoker  . Smokeless tobacco: Never Used  Substance and Sexual Activity  . Alcohol use: No  . Drug use: No  . Sexual activity: Not on file  Lifestyle  . Physical activity:    Days per week: 7 days    Minutes per session: 20 min  . Stress: Not at all  Relationships  . Social connections:    Talks on phone: Three times a week    Gets together: Three times a week    Attends religious service: Never    Active member of club or organization: No    Attends meetings of clubs or organizations: Never    Relationship status: Widowed  . Intimate partner violence:    Fear of current or ex partner: No    Emotionally abused: No    Physically abused: No    Forced sexual activity: No  Other Topics Concern  . Not on file  Social History Narrative   Lives at [facility] 01/05/2015   Widowed   Never smoked   Exercise walking   Alcohol none   POA/Living Will       FAMILY HISTORY:   Family Status  Relation Name Status  . Mother  Deceased       alzheimers, stroke  . Son Audry Pili Deceased       multiple myeloma  . Son Octavia Bruckner Deceased       pancreatic cancer  . Father  Deceased       unknown  . Brother [employer]  . Brother Shanon Brow Deceased  . Sister Dentist  . Sister Herold Harms  . Daughter [employer]  . Daughter  Deceased        decapitated in [place]  . Mat Uncle  (Not Specified)  . Neg Hx  (Not Specified)    ROS:   Review of Systems  Constitutional: Negative.   HENT: Negative.   Eyes: Negative.   Cardiovascular: Negative.   Gastrointestinal: Negative.   Genitourinary: Negative.   Musculoskeletal: Negative.   Skin: Negative.   Endo/Heme/Allergies: Negative.     PHYSICAL  EXAMINATION:    VITALS:   Vitals:   05/11/18 1123  BP: 138/78  Pulse: 64  SpO2: 95%    GEN:  The patient appears stated age and is in NAD. HEENT:  Normocephalic, atraumatic.  The mucous membranes are moist. The superficial temporal arteries are without ropiness or tenderness. CV:  RRR with 3/6 SEM Lungs:  CTAB Neck/HEME:  There are no carotid bruits bilaterally.  Neurological examination:  Orientation: Pt is alert and oriented x 3.  She can be repetitive.  She again remembers the ages of this examiner's twins and asks me about them. Montreal Cognitive Assessment  11/14/2017 11/19/2016 12/13/2014  Visuospatial/ Executive (0/5) _0 Naming (0/3) _1 Attention: Read list of digits (0/2) _2 Attention: Read list of letters (0/1) _3 Attention: Serial 7 subtraction starting at 100 (0/3) 0 0 1  Language: Repeat phrase (0/2) _4 Language : Fluency (0/1) 1 0 1  Abstraction (0/2) _5 Delayed Recall (0/5) 1 0 0  Orientation (0/6) _6 Total _7 Adjusted Score (based on education) _8 Cranial nerves: There is good facial symmetry. The speech is fluent and clear. Soft palate rises symmetrically and there is no tongue deviation. Hearing is intact to conversational tone. Sensation: Sensation is intact to light touch throughout Motor: Strength is 5/5 in the bilateral upper and lower extremities.   Shoulder shrug is equal and symmetric.  There is no pronator drift.  Movement examination: Tone: There is normal tone in the upper and lower extremities. Abnormal movements: None Coordination:  There is mild  decremation with RAM's, with any form of RAMS, including alternating supination and pronation of the forearm, hand opening and closing, finger taps, heel taps and toe taps on the left Gait and Station: The patient pushes off of the chair to arise.  She initially has some start hesitation.  When she is out in the hallway, she actually walks very well with a walker.  She does turn en bloc.    Movement examination: Tone: There is slight increased tone in the LUE.   Abnormal movements: None Coordination:  There is decremation with finger taps, toe taps bilaterally and with heel taps on the Lam Gait and Station: The patient pushes off of the chair to get off.  Ambulates with rollator.  She has start hesitation.  She is stooped at the waist.  She shuffle some.  She drags the left leg.  Labs:  Lab Results  Component Value Date   MCEYEMVV61 224 08/11/2014   No results found for: HGBA1C  Lab Results  Component Value Date   WBC 5.2 04/11/2018   HGB 14.3 04/11/2018   HCT 43 04/11/2018   MCV 95.1 12/04/2016   PLT 170 04/11/2018     Chemistry      Component Value Date/Time   NA 144 04/11/2018 0700   K 4.3 04/11/2018 0700   CL 104 12/04/2016 0958   CO2 30 12/04/2016 0958   BUN 20 04/11/2018 0700   CREATININE 0.7 04/11/2018 0700   CREATININE 0.93 12/04/2016 0958   GLU 103 04/11/2018 0700      Component Value Date/Time   CALCIUM 9.5 12/04/2016 0958   ALKPHOS 56 04/11/2018 0700   AST 21 04/11/2018 0700   ALT 5 (A) 04/11/2018 0700   BILITOT 0.9 12/04/2016 0958     Lab Results  Component  Value Date   TSH 5.83 04/11/2018      ASSESSMENT/PLAN:  1.  Parkinson's disease with PDD  -dx formally in 08/2015  -We discussed the diagnosis as well as pathophysiology of the disease.  We discussed treatment options as well as prognostic indicators.  Patient education was provided.  -Continue carbidopa/levodopa 25/100, 2 tablets in the morning, 1 in the afternoon and 1 in the evening.  -  continue to struggle with differences between what is seen on her examination and what is reported by her daughter.  In addition, we have contacted [facility] and they have not seen the things that her daughter has.  I am leery about giving more medications, therefore.  -talked with patient and daughter about memory medications and neither wants to try anything right now.  Will let me know if she changes her mind.    2.  Melanoma  -discussed with the patient that PD increases risk of melanoma and sees [facility] dermatology.  daughter states that a PA is now coming to [place] and has been doing skin checks on her.    3.  Constipation  -doing much better and managing with prune juice, mag citrate and applesauce  4. insomnia  -has some day night reversal.  Didn't do well with melatonin per daughter but does well with regular tylenol at night.  Daughter asks me to have this scheduled, but she does not want it scheduled every night, but rather every other night.  Wrote an order for this.  Talked to the patient about staying active during the day.  5.  Follow-up 6 months.  Much greater than 50% of this visit was spent in counseling and coordinating care.  Total face to face time:  30 min

## 2018-05-11 ENCOUNTER — Encounter: Payer: Self-pay | Admitting: Neurology

## 2018-05-11 ENCOUNTER — Ambulatory Visit (INDEPENDENT_AMBULATORY_CARE_PROVIDER_SITE_OTHER): Payer: Medicare Other | Admitting: Neurology

## 2018-05-11 VITALS — BP 138/78 | HR 64

## 2018-05-11 DIAGNOSIS — G20 Parkinson's disease: Secondary | ICD-10-CM

## 2018-05-11 DIAGNOSIS — F0280 Dementia in other diseases classified elsewhere without behavioral disturbance: Secondary | ICD-10-CM

## 2018-05-12 DIAGNOSIS — L603 Nail dystrophy: Secondary | ICD-10-CM | POA: Diagnosis not present

## 2018-05-13 ENCOUNTER — Encounter: Payer: Self-pay | Admitting: Internal Medicine

## 2018-05-13 ENCOUNTER — Non-Acute Institutional Stay: Payer: Medicare Other | Admitting: Internal Medicine

## 2018-05-13 VITALS — BP 111/67 | HR 60 | Temp 98.5°F | Wt 134.0 lb

## 2018-05-13 DIAGNOSIS — F22 Delusional disorders: Secondary | ICD-10-CM | POA: Insufficient documentation

## 2018-05-13 DIAGNOSIS — M6701 Short Achilles tendon (acquired), right ankle: Secondary | ICD-10-CM | POA: Insufficient documentation

## 2018-05-13 DIAGNOSIS — G609 Hereditary and idiopathic neuropathy, unspecified: Secondary | ICD-10-CM

## 2018-05-13 DIAGNOSIS — F0280 Dementia in other diseases classified elsewhere without behavioral disturbance: Secondary | ICD-10-CM

## 2018-05-13 DIAGNOSIS — M6702 Short Achilles tendon (acquired), left ankle: Secondary | ICD-10-CM

## 2018-05-13 DIAGNOSIS — G20 Parkinson's disease: Secondary | ICD-10-CM | POA: Diagnosis not present

## 2018-05-13 DIAGNOSIS — I872 Venous insufficiency (chronic) (peripheral): Secondary | ICD-10-CM

## 2018-05-13 DIAGNOSIS — G20A1 Parkinson's disease without dyskinesia, without mention of fluctuations: Secondary | ICD-10-CM

## 2018-05-13 NOTE — Progress Notes (Signed)
Location:  Occupational psychologist of Service:  Clinic (12)  Provider: Elfego Giammarino L. Mariea Clonts, D.O., C.M.D.  Code Status: DNR Goals of Care:  Advanced Directives 05/13/2018  Does Patient Have a Medical Advance Directive? Yes  Type of Advance Directive Out of facility DNR (pink MOST or yellow form);[place];Living will  Does patient want to make changes to medical advance directive? No - Patient declined  Copy of [place] in Chart? Yes - validated most recent copy scanned in chart (See row information)  Would patient like information on creating a medical advance directive? No - Patient declined  Pre-existing out of facility DNR order (yellow form or pink MOST form) Yellow form placed in chart (order not valid for inpatient use)     Chief Complaint  Patient presents with  . Medical Management of Chronic Issues    66mth follow-up    HPI: Patient is a 84 y.o. female seen today for medical management of chronic diseases.    She is doing fine.  She just saw Dr. Carles Collet who reviewed her PD and kept her meds the same.  She did have one fall 9/18 but none since.  She has no pain.  She feels well.  She has no complaints.    She still wakes up at night and wanders the halls, then goes back to sleep and sleeps in in the morning.    He bowel regimen is effective.    Her spirits are good.    There had been more concerns about hallucinations and delusions last visit noted by family but staff have not noted this and pt is felt to be stable in her AL environment.  She still sees derm on site for her annual skin exam.  She is using new hose from Dr. Jess Barters office where she went about her hammertoes.  They are also using sleeves between the toes.  They sometimes get lost in the laundry. Per his note "work on Achilles stretching at [facility] recommended walking for exercise to help the calf pump recommended [place] compression stockings and  recommended [place] [address] running sneakers to unload the forefoot."  Past Medical History:  Diagnosis Date  . Aortic stenosis, mild   . Constipation, chronic   . Gilbert syndrome   . HTN (hypertension)   . Hyperlipidemia   . Hypothyroidism   . Melanoma ([place])   . Neuropathy   . Osteoporosis   . Parkinson's disease ([place])   . Peripheral neuropathy    Bilateral  . Rheumatic fever    does not take Flu shot     Past Surgical History:  Procedure Laterality Date  . ABDOMINAL HYSTERECTOMY  1963   no BSO; no abnormal PAPs  . COLONOSCOPY  2000   negative; [place]  . [place]  . MELANOMA EXCISION  08/2014    Allergies  Allergen Reactions  . Sulfonamide Derivatives     REACTION:generalized  swelling  . Gabapentin Hypertension and Swelling  . Sulfa Antibiotics Swelling    Outpatient Encounter Medications as of 05/13/2018  Medication Sig  . acetaminophen (TYLENOL) 325 MG tablet Take 2 tablets (650 mg total) by mouth at bedtime as needed for mild pain.  Marland Kitchen amLODipine (NORVASC) 5 MG tablet Take 5 mg by mouth daily.  . Calcium Carbonate-Vitamin D (CALCIUM 600/VITAMIN D PO) Take 1 tablet by mouth daily.   . capsaicin (ZOSTRIX) 0.025 % cream Apply topically 2 (two) times daily.  . carbidopa-levodopa (  SINEMET IR) 25-100 MG tablet 2 in the morning, 1 in the afternoon, 1 in the evening  . Cholecalciferol (VITAMIN D) 2000 units CAPS Take 1 capsule by mouth daily.  . furosemide (LASIX) 20 MG tablet Take 10 mg by mouth as needed.   Marland Kitchen levothyroxine (SYNTHROID, LEVOTHROID) 50 MCG tablet Take one on Sunday, Monday, Wednesday, and Friday 75 mcg other days  . levothyroxine (SYNTHROID, LEVOTHROID) 75 MCG tablet Take 1 on Tuesday, Thursday, saturday  . magnesium citrate SOLN Take 1 Bottle by mouth daily as needed.   . Multiple Vitamin (MULTIVITAMIN) capsule Take 1 capsule by mouth daily.    Marland Kitchen omeprazole (PRILOSEC) 20 MG capsule Take 20 mg by mouth daily.   . potassium chloride (K-DUR)  10 MEQ tablet Take 10 mEq by mouth as needed. Use when taking Lasix   No facility-administered encounter medications on file as of 05/13/2018.     Review of Systems:  Review of Systems  Constitutional: Negative for chills, fever, malaise/fatigue and weight loss.  HENT: Positive for hearing loss. Negative for congestion.   Eyes: Negative for blurred vision.       Glasses  Respiratory: Negative for cough and shortness of breath.   Cardiovascular: Positive for leg swelling. Negative for chest pain and palpitations.       Edema much better with new socks  Gastrointestinal: Positive for constipation. Negative for abdominal pain, blood in stool, diarrhea, melena, nausea and vomiting.  Genitourinary: Negative for dysuria.  Musculoskeletal: Negative for falls, joint pain and myalgias.  Skin: Negative for itching and rash.  Neurological: Positive for tremors. Negative for dizziness and loss of consciousness.  Psychiatric/Behavioral: Positive for memory loss. Negative for depression. The patient has insomnia. The patient is not nervous/anxious.     Health Maintenance  Topic Date Due  . TETANUS/TDAP  08/10/2024  . DEXA SCAN  Completed  . INFLUENZA VACCINE  Discontinued  . PNA vac Low Risk Adult  Discontinued    Physical Exam: Vitals:   05/13/18 1022  BP: (!) 148/80  Pulse: 60  Temp: 98.5 F (36.9 C)  TempSrc: Oral  SpO2: 95%  Weight: 134 lb (60.8 kg)   Body mass index is 26.17 kg/m. Physical Exam Constitutional:      Appearance: Normal appearance.  HENT:     Head: Normocephalic and atraumatic.     Ears:     Comments: HOH Eyes:     Pupils: Pupils are equal, round, and reactive to light.     Comments: glasses  Cardiovascular:     Rate and Rhythm: Normal rate and regular rhythm.     Pulses: Normal pulses.     Heart sounds: Murmur present.  Pulmonary:     Effort: Pulmonary effort is normal.     Breath sounds: Normal breath sounds.  Musculoskeletal:        General: No  tenderness.     Comments: Kyphotic, ambulates with walker; came with wheelchair  Skin:    General: Skin is warm and dry.  Neurological:     General: No focal deficit present.     Mental Status: She is alert.     Comments: Oriented to person and place, not time, short term memory loss and repeats stories; no mention of delusions or hallucinations today   Psychiatric:        Mood and Affect: Mood normal.     Labs reviewed: Basic Metabolic Panel: Recent Labs    02/17/18 0259 04/11/18 0700  NA 141 144  K  4.5 4.3  BUN 22* 20  CREATININE 0.9 0.7  TSH  --  5.83   Liver Function Tests: Recent Labs    04/11/18 0700  AST 21  ALT 5*  ALKPHOS 56   No results for input(s): LIPASE, AMYLASE in the last 8760 hours. No results for input(s): AMMONIA in the last 8760 hours. CBC: Recent Labs    02/17/18 0259 04/11/18 0700  WBC 5.9 5.2  HGB 14.1 14.3  HCT 42 43  PLT 183 170   Lipid Panel: No results for input(s): CHOL, HDL, LDLCALC, TRIG, CHOLHDL, LDLDIRECT in the last 8760 hours. No results found for: HGBA1C  Procedures since last visit: No results found.  Assessment/Plan 1. 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 Parkinson's disease ([place]) -cont sinemet regimen per Dr. Carles Collet  4. Chronic venous insufficiency -cont compression socks from Dr. Jess Barters office and elevation at rest -much improved  5. Hereditary and idiopathic peripheral neuropathy -ongoing, at risk for wounds so requires close attention by staff to her feet for any new open areas and keep feet dry to prevent fungal infections which have led to skin breakdown--pt is resistant to regular bathing at times with help--likes to do things  herself but may not remember to do so  6. Contracture of both Achilles tendons -Dr. Sharol Given recommended stretching of the achilles with exercises with therapy  Labs/tests ordered:  No new Next appt:  4 mos med mgt  Chantavia Bazzle L. Teretha Chalupa, D.O. [facility] [address], [place] [address] Cell Phone (Mon-Fri 8am-5pm):  [phone] On Call:  [phone] & follow prompts after 5pm & weekends Office Phone:  [phone] Office Fax:  [phone]

## 2018-05-15 ENCOUNTER — Encounter: Payer: Self-pay | Admitting: Psychology

## 2018-05-15 NOTE — Progress Notes (Addendum)
Message from [facility] at [facility]. They have noticed that there is a general decline in the patients ability to attend to personal hygiene. She reports that patient is hard to redirect and can become very fixated on things. Right now the patient has been fixated on the christmas tree. She has been calling the daughter in an obsessive manner. The facility has been working with the daughter to find ways to keep the patient from obsessively attempting to contact her.

## 2018-05-27 DIAGNOSIS — E018 Other iodine-deficiency related thyroid disorders and allied conditions: Secondary | ICD-10-CM | POA: Diagnosis not present

## 2018-05-27 DIAGNOSIS — E039 Hypothyroidism, unspecified: Secondary | ICD-10-CM | POA: Diagnosis not present

## 2018-05-27 LAB — TSH: TSH: 4.07 (ref 0.41–5.90)

## 2018-06-02 ENCOUNTER — Encounter: Payer: Self-pay | Admitting: Internal Medicine

## 2018-06-16 ENCOUNTER — Encounter: Payer: Self-pay | Admitting: Internal Medicine

## 2018-07-23 DIAGNOSIS — L603 Nail dystrophy: Secondary | ICD-10-CM | POA: Diagnosis not present

## 2018-09-16 ENCOUNTER — Encounter: Payer: Self-pay | Admitting: Internal Medicine

## 2018-09-16 ENCOUNTER — Other Ambulatory Visit: Payer: Self-pay

## 2018-09-16 ENCOUNTER — Non-Acute Institutional Stay: Payer: Medicare Other | Admitting: Internal Medicine

## 2018-09-16 VITALS — BP 128/78 | HR 64 | Temp 98.7°F | Ht 60.0 in | Wt 135.0 lb

## 2018-09-16 DIAGNOSIS — G20A1 Parkinson's disease without dyskinesia, without mention of fluctuations: Secondary | ICD-10-CM

## 2018-09-16 DIAGNOSIS — G20 Parkinson's disease: Secondary | ICD-10-CM

## 2018-09-16 DIAGNOSIS — I872 Venous insufficiency (chronic) (peripheral): Secondary | ICD-10-CM

## 2018-09-16 DIAGNOSIS — G609 Hereditary and idiopathic neuropathy, unspecified: Secondary | ICD-10-CM | POA: Diagnosis not present

## 2018-09-16 DIAGNOSIS — F22 Delusional disorders: Secondary | ICD-10-CM

## 2018-09-16 DIAGNOSIS — F0280 Dementia in other diseases classified elsewhere without behavioral disturbance: Secondary | ICD-10-CM | POA: Diagnosis not present

## 2018-09-16 NOTE — Progress Notes (Signed)
Location:  Occupational psychologist of Service:  Clinic (12)  Provider: Thai Hemrick L. Mariea Clonts, D.O., C.M.D.  Code Status: DNR Goals of Care:  Advanced Directives 09/16/2018  Does Patient Have a Medical Advance Directive? Yes  Type of Paramedic of [place];Out of facility DNR (pink MOST or yellow form);Living will  Does patient want to make changes to medical advance directive? No - Patient declined  Copy of [place] in Chart? Yes - validated most recent copy scanned in chart (See row information)  Would patient like information on creating a medical advance directive? -  Pre-existing out of facility DNR order (yellow form or pink MOST form) Yellow form placed in chart (order not valid for inpatient use)   Chief Complaint  Patient presents with  . Medical Management of Chronic Issues    71mth follow-up    HPI: Patient is a 85 y.o. female seen today for medical management of chronic diseases.  She has a h/o parkinson's disease with dementia, paranoid psychosis, chronic venous insufficiency, melanoma, constipation, aortic valve stenosis, senile osteoporosis with prior thoracic compression fx, urge incontinence of urine, hyperlipidemia, and gilbert's syndrome.  She lives in assisted living at [facility].  Only concern is increased swelling of right leg, actually both but right is slightly larger.  She reports doing a lot of walking until more isolation was put into effect.  No pain, no redness, no open areas, stockings and skin appear clean.  She says she's elevating them at rest.  She is wearing the compression hose which are new and quite tight.  She has bumped her third toe and the nail is now black (right foot).    Bowels move with regimen she's on.    She is talking with her daughter by phone due to social isolation.    She has no pain complaints anywhere.    Staff report no new concerns with her.  Past Medical History:    Diagnosis Date  . Aortic stenosis, mild   . Constipation, chronic   . Gilbert syndrome   . HTN (hypertension)   . Hyperlipidemia   . Hypothyroidism   . Melanoma ([place])   . Neuropathy   . Osteoporosis   . Parkinson's disease ([place])   . Peripheral neuropathy    Bilateral  . Rheumatic fever    does not take Flu shot     Past Surgical History:  Procedure Laterality Date  . ABDOMINAL HYSTERECTOMY  1963   no BSO; no abnormal PAPs  . COLONOSCOPY  2000   negative; [place]  . [place]  . MELANOMA EXCISION  08/2014    Allergies  Allergen Reactions  . Sulfonamide Derivatives     REACTION:generalized  swelling  . Gabapentin Hypertension and Swelling  . Sulfa Antibiotics Swelling    Outpatient Encounter Medications as of 09/16/2018  Medication Sig  . acetaminophen (TYLENOL) 325 MG tablet Take 2 tablets (650 mg total) by mouth at bedtime as needed for mild pain.  Marland Kitchen acetaminophen (TYLENOL) 325 MG tablet Take 325 mg by mouth every 6 (six) hours as needed for mild pain.  Marland Kitchen amLODipine (NORVASC) 5 MG tablet Take 5 mg by mouth daily.  . Calcium Carbonate-Vitamin D (CALCIUM 600/VITAMIN D PO) Take 1 tablet by mouth daily.   . carbidopa-levodopa (SINEMET IR) 25-100 MG tablet 2 in the morning, 1 in the afternoon, 1 in the evening  . Cholecalciferol (VITAMIN D) 2000 units CAPS Take 1 capsule  by mouth daily.  . furosemide (LASIX) 20 MG tablet Take 10 mg by mouth as needed.   Marland Kitchen levothyroxine (SYNTHROID, LEVOTHROID) 50 MCG tablet Take one on Sunday, Monday, Wednesday, and Friday 75 mcg other days  . levothyroxine (SYNTHROID, LEVOTHROID) 75 MCG tablet Take 1 on Tuesday, Thursday, saturday  . magnesium citrate SOLN Take 1 Bottle by mouth daily as needed.   . Magnesium Sulfate, Laxative, (EPSOM SALT) GRAN To soak feet daily  . Multiple Vitamin (MULTIVITAMIN) capsule Take 1 capsule by mouth daily.    Marland Kitchen omeprazole (PRILOSEC) 20 MG capsule Take 20 mg by mouth daily.   . potassium  chloride (K-DUR) 10 MEQ tablet Take 10 mEq by mouth as needed. Use when taking Lasix  . [DISCONTINUED] capsaicin (ZOSTRIX) 0.025 % cream Apply topically 2 (two) times daily.   No facility-administered encounter medications on file as of 09/16/2018.     Review of Systems:  Review of Systems  Constitutional: Negative for chills, fever and malaise/fatigue.  HENT: Positive for hearing loss. Negative for congestion.        Some chronic rhinitis  Eyes: Negative for blurred vision.  Respiratory: Negative for cough and shortness of breath.   Cardiovascular: Positive for leg swelling. Negative for chest pain and palpitations.  Gastrointestinal: Negative for abdominal pain, blood in stool, constipation, diarrhea and melena.       Bowel regimen has been effective  Genitourinary: Positive for urgency. Negative for dysuria.  Musculoskeletal: Negative for falls and joint pain.  Skin: Negative for itching and rash.  Neurological: Negative for dizziness and loss of consciousness.  Endo/Heme/Allergies: Bruises/bleeds easily.  Psychiatric/Behavioral: Positive for memory loss. Negative for depression and hallucinations. The patient is not nervous/anxious and does not have insomnia.        No psychosis reported by staff and family not present for this visit due to covid pandemic    Health Maintenance  Topic Date Due  . TETANUS/TDAP  08/10/2024  . DEXA SCAN  Completed  . INFLUENZA VACCINE  Discontinued  . PNA vac Low Risk Adult  Discontinued    Physical Exam: Vitals:   09/16/18 1108  BP: 128/78  Pulse: 64  Temp: 98.7 F (37.1 C)  TempSrc: Oral  SpO2: 95%  Weight: 135 lb (61.2 kg)  Height: 5' (1.524 m)   Body mass index is 26.37 kg/m. Physical Exam Vitals signs and nursing note reviewed.  Constitutional:      General: She is not in acute distress.    Appearance: She is normal weight. She is not toxic-appearing.  HENT:     Head: Normocephalic and atraumatic.     Nose: Rhinorrhea  present.  Eyes:     Extraocular Movements: Extraocular movements intact.     Pupils: Pupils are equal, round, and reactive to light.     Comments: Appear watery today  Neck:     Vascular: Carotid bruit present.  Cardiovascular:     Rate and Rhythm: Normal rate and regular rhythm.     Heart sounds: Murmur present.  Pulmonary:     Effort: Pulmonary effort is normal.     Breath sounds: Normal breath sounds.  Abdominal:     General: Bowel sounds are normal.     Comments: Chronic prominence of abdomen (postural)  Skin:    General: Skin is warm and dry.     Comments: Some chronic erythema and swelling of both LEs; right is more edematous than left--no increased pain, negative homan's, dorsalis pedis and posterior tibialis pulses  both intact; no skin breakdown or increased warmth of right over left  Neurological:     General: No focal deficit present.     Mental Status: She is alert. Mental status is at baseline.     Gait: Gait abnormal.     Comments: Shuffling gait, but was brought to clinic in manual wheelchair today  Psychiatric:        Mood and Affect: Mood normal.     Comments: Very pleasant and appropriate     Labs reviewed: Basic Metabolic Panel: Recent Labs    02/17/18 0259 04/11/18 0700 05/27/18  NA 141 144  --   K 4.5 4.3  --   BUN 22* 20  --   CREATININE 0.9 0.7  --   TSH  --  5.83 4.07   Liver Function Tests: Recent Labs    04/11/18 0700  AST 21  ALT 5*  ALKPHOS 56   No results for input(s): LIPASE, AMYLASE in the last 8760 hours. No results for input(s): AMMONIA in the last 8760 hours. CBC: Recent Labs    02/17/18 0259 04/11/18 0700  WBC 5.9 5.2  HGB 14.1 14.3  HCT 42 43  PLT 183 170    Assessment/Plan 1. Parkinson's disease ([place]) -no significant changes since last visit; continues with some festination and freezing with walking, chronic bradykinesia -cont same regimen as per Dr. Carles Collet  2. 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 Chronic venous insufficiency -some increased swelling right vs left at present, but no pain, change in color and pulses are intact -advised staff to ensure she is elevating her feet at rest -cont compression hose on in am and off at hs and keeping them clean and dry  4. Hereditary and idiopathic peripheral neuropathy -ongoing, but no worrisome open areas or signs of tinea this time, stockings are clean and dry  5. 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 Shambria Camerer, D.O. [facility] [address], [place] [address] Cell Phone (Mon-Fri 8am-5pm):  [phone] On Call:  [phone] & follow prompts after 5pm & weekends Office Phone:  [phone] Office Fax:  [phone]

## 2018-11-04 DIAGNOSIS — Z20828 Contact with and (suspected) exposure to other viral communicable diseases: Secondary | ICD-10-CM | POA: Diagnosis not present

## 2018-11-05 LAB — NOVEL CORONAVIRUS, NAA: SARS-CoV-2, NAA: NEGATIVE

## 2018-11-12 ENCOUNTER — Ambulatory Visit: Payer: Medicare Other | Admitting: Neurology

## 2018-11-24 DIAGNOSIS — L603 Nail dystrophy: Secondary | ICD-10-CM | POA: Diagnosis not present

## 2018-12-04 ENCOUNTER — Encounter: Payer: Self-pay | Admitting: Adult Health

## 2018-12-04 ENCOUNTER — Non-Acute Institutional Stay (SKILLED_NURSING_FACILITY): Payer: Medicare Other | Admitting: Adult Health

## 2018-12-04 DIAGNOSIS — R52 Pain, unspecified: Secondary | ICD-10-CM | POA: Diagnosis not present

## 2018-12-04 DIAGNOSIS — M7989 Other specified soft tissue disorders: Secondary | ICD-10-CM

## 2018-12-04 DIAGNOSIS — I350 Nonrheumatic aortic (valve) stenosis: Secondary | ICD-10-CM | POA: Diagnosis not present

## 2018-12-04 DIAGNOSIS — K5901 Slow transit constipation: Secondary | ICD-10-CM

## 2018-12-04 DIAGNOSIS — G20 Parkinson's disease: Secondary | ICD-10-CM

## 2018-12-04 DIAGNOSIS — F0280 Dementia in other diseases classified elsewhere without behavioral disturbance: Secondary | ICD-10-CM | POA: Diagnosis not present

## 2018-12-04 DIAGNOSIS — G20A1 Parkinson's disease without dyskinesia, without mention of fluctuations: Secondary | ICD-10-CM

## 2018-12-04 DIAGNOSIS — M25552 Pain in left hip: Secondary | ICD-10-CM

## 2018-12-04 MED ORDER — TRAMADOL HCL 50 MG PO TABS: 25.0000 mg | ORAL_TABLET | Freq: Four times a day (QID) | ORAL | 0 refills | Status: DC | PRN

## 2018-12-04 NOTE — Progress Notes (Signed)
Location:  Occupational psychologist of Service:  SNF (31) Provider:   Cindi Carbon, ANP [place] [phone]   Gayland Curry, DO  Patient Care Team: Gayland Curry, DO as PCP - General (Geriatric Medicine) Jola Schmidt, MD as Consulting Physician (Ophthalmology) Tat, Eustace Quail, DO as Consulting Physician (Neurology) Martinique, Peter M, MD as Consulting Physician (Cardiology)  Extended Emergency Contact Information Primary Emergency Contact: [name] Address: [address]          [name] Phone: [phone] Mobile Phone: [phone] Relation: Daughter  Code Status:  DNR Goals of care: Advanced Directive information Advanced Directives 09/16/2018  Does Patient Have a Medical Advance Directive? Yes  Type of Paramedic of [place];Out of facility DNR (pink MOST or yellow form);Living will  Does patient want to make changes to medical advance directive? No - Patient declined  Copy of [place] in Chart? Yes - validated most recent copy scanned in chart (See row information)  Would patient like information on creating a medical advance directive? -  Pre-existing out of facility DNR order (yellow form or pink MOST form) Yellow form placed in chart (order not valid for inpatient use)     Chief Complaint  Patient presents with   Acute Visit    left hip pain    HPI:  Pt is a 85 y.o. female seen today for an acute visit for left hip pain. She is a resident of the [facility] area and was moved to the skilled care section due to pain and inability to ambulate or self care. She has a hx of PD, dementia, venous insuff, constipation, Gilbert syndrome, AS, hypothyroidism, and htn. She states she woke up with this pain this morning. No radicular pain, numbness or tingling.  She states that it is severe and that she is not able to walk. She typically spends most of the day in  the recliner and walks short distances with a walker. She also has severe swelling in the right lower ext. This is chronic but appears worse. No pain in the leg or shortness of breath. A doppler was obtained 12/04/2018 with preliminary results neg for a DVT. She has chronic abd protrusion due to kyphotic posture but also has difficult to control constipation. She took mag citrate and had a bm yesterday.  She received a prn dose of lasix this morning for swelling.    Past Medical History:  Diagnosis Date   Aortic stenosis, mild    Constipation, chronic    Gilbert syndrome    HTN (hypertension)    Hyperlipidemia    Hypothyroidism    Melanoma (HCC)    Neuropathy    Osteoporosis    Parkinson's disease ([place])    Peripheral neuropathy    Bilateral   Rheumatic fever    does not take Flu shot    Past Surgical History:  Procedure Laterality Date   ABDOMINAL HYSTERECTOMY  1963   no BSO; no abnormal PAPs   COLONOSCOPY  2000   negative; [facility]  08/2014    Allergies  Allergen Reactions   Sulfonamide Derivatives     REACTION:generalized  swelling   Gabapentin Hypertension and Swelling   Sulfa Antibiotics Swelling    Outpatient Encounter Medications as of 12/04/2018  Medication Sig   acetaminophen (TYLENOL) 325 MG tablet Take 2 tablets (650 mg total) by mouth  at bedtime as needed for mild pain.   acetaminophen (TYLENOL) 325 MG tablet Take 325 mg by mouth every 6 (six) hours as needed for mild pain.   amLODipine (NORVASC) 5 MG tablet Take 5 mg by mouth daily.   Calcium Carbonate-Vitamin D (CALCIUM 600/VITAMIN D PO) Take 1 tablet by mouth daily.    carbidopa-levodopa (SINEMET IR) 25-100 MG tablet 2 in the morning, 1 in the afternoon, 1 in the evening   Cholecalciferol (VITAMIN D) 2000 units CAPS Take 1 capsule by mouth daily.   furosemide (LASIX) 20 MG tablet Take 10 mg by mouth as needed.    levothyroxine  (SYNTHROID, LEVOTHROID) 50 MCG tablet Take one on Sunday, Monday, Wednesday, and Friday 75 mcg other days   levothyroxine (SYNTHROID, LEVOTHROID) 75 MCG tablet Take 1 on Tuesday, Thursday, saturday   magnesium citrate SOLN Take 1 Bottle by mouth daily as needed.    Magnesium Sulfate, Laxative, (EPSOM SALT) GRAN To soak feet daily   Multiple Vitamin (MULTIVITAMIN) capsule Take 1 capsule by mouth daily.     omeprazole (PRILOSEC) 20 MG capsule Take 20 mg by mouth daily.    potassium chloride (K-DUR) 10 MEQ tablet Take 10 mEq by mouth as needed. Use when taking Lasix   traMADol (ULTRAM) 50 MG tablet Take 0.5 tablets (25 mg total) by mouth every 6 (six) hours as needed.   [DISCONTINUED] traMADol (ULTRAM) 50 MG tablet Take 25 mg by mouth every 6 (six) hours as needed.   No facility-administered encounter medications on file as of 12/04/2018.     Review of Systems  Constitutional: Positive for activity change. Negative for appetite change, chills, diaphoresis, fatigue, fever and unexpected weight change.  HENT: Negative for congestion.   Respiratory: Negative for cough, shortness of breath and wheezing.   Cardiovascular: Positive for leg swelling. Negative for chest pain and palpitations.  Gastrointestinal: Negative for abdominal distention, abdominal pain, constipation and diarrhea.  Genitourinary: Negative for difficulty urinating and dysuria.  Musculoskeletal: Positive for arthralgias and gait problem. Negative for back pain, joint swelling and myalgias.  Neurological: Negative for dizziness, tremors, seizures, syncope, facial asymmetry, speech difficulty, weakness, light-headedness, numbness and headaches.  Psychiatric/Behavioral: Positive for confusion. Negative for agitation and behavioral problems.    Immunization History  Administered Date(s) Administered   Influenza-Unspecified 03/26/2018   PPD Test 04/29/2016   Tdap 08/11/2014   Pertinent  Health Maintenance Due  Topic  Date Due   DEXA SCAN  Completed   INFLUENZA VACCINE  Discontinued   PNA vac Low Risk Adult  Discontinued   Fall Risk  09/16/2018 05/13/2018 05/11/2018 03/18/2018 02/18/2018  Falls in the past year? 0 1 0 Yes Yes  Number falls in past yr: 0 0 0 1 1  Comment - - - - -  Injury with Fall? 0 0 0 No No  Comment - - - - -  Risk Factor Category  - - - - -  Follow up - - Falls evaluation completed - -   Functional Status Survey:    Vitals:   12/04/18 1112  BP: (!) 150/77  Pulse: 68  Resp: 18  Temp: 98.7 F (37.1 C)  SpO2: 96%  Weight: 133 lb (60.3 kg)   Body mass index is 25.97 kg/m. Physical Exam Vitals signs and nursing note reviewed.  Constitutional:      General: She is not in acute distress.    Appearance: She is not diaphoretic.  HENT:     Head: Normocephalic and atraumatic.  Neck:  Vascular: No JVD.  Cardiovascular:     Rate and Rhythm: Normal rate and regular rhythm.     Pulses:          Femoral pulses are 2+ on the right side and 2+ on the left side.      Dorsalis pedis pulses are 2+ on the right side and 2+ on the left side.       Posterior tibial pulses are 2+ on the right side and 2+ on the left side.     Heart sounds: Murmur present.  Pulmonary:     Effort: Pulmonary effort is normal. No respiratory distress.     Breath sounds: Normal breath sounds. No wheezing.  Abdominal:     General: Abdomen is flat. Bowel sounds are normal. There is distension.     Palpations: Abdomen is soft.     Tenderness: There is no abdominal tenderness. There is no right CVA tenderness, left CVA tenderness or guarding.  Musculoskeletal:        General: Deformity (kyphosis) present.     Left hip: She exhibits no tenderness, no bony tenderness, no swelling, no crepitus, no deformity and no laceration.     Right lower leg: Edema present.  Skin:    General: Skin is warm and dry.     Findings: Erythema (RLE, neg homan, not tender) present.  Neurological:     General: No focal  deficit present.     Mental Status: She is alert and oriented to person, place, and time.  Psychiatric:        Mood and Affect: Mood normal.     Labs reviewed: Recent Labs    02/17/18 0259 04/11/18 0700  NA 141 144  K 4.5 4.3  BUN 22* 20  CREATININE 0.9 0.7   Recent Labs    04/11/18 0700  AST 21  ALT 5*  ALKPHOS 56   Recent Labs    02/17/18 0259 04/11/18 0700  WBC 5.9 5.2  HGB 14.1 14.3  HCT 42 43  PLT 183 170   Lab Results  Component Value Date   TSH 4.07 05/27/2018   No results found for: HGBA1C Lab Results  Component Value Date   CHOL 199 08/12/2014   HDL 87 08/12/2014   LDLCALC 104 (H) 08/12/2014   LDLDIRECT 105.9 06/15/2013   TRIG 38 08/12/2014   CHOLHDL 2.3 08/12/2014    Significant Diagnostic Results in last 30 days:  No results found.  Assessment/Plan 1. Left hip pain Obtain 2 view xray of hip/pelvis.  Continue tylenol for moderate pain but add ultram 25 mg q 6 hrs prn for severe pain  2. Right leg swelling Prelim results neg for DVT This is chronic related to venous insuff with stasis dermatitis. Will have the staff mark the red area with a marker and obtain CBC and monitor vitals as well. Continue compression hose and elevation. Monitor weights.   3. Slow transit constipation Controlled Continue mag citrate as needed. Consider adding additional agents, will monitor bowel patterns more closely now that she is skilled care.   4. Aortic valve stenosis, etiology of cardiac valve disease unspecified Noted murmur on exam, no issues with syncope, sob, etc.           5. Parkinson's disease ([place]) Continue sinemet as prescribed.   6. 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 Seems to be increasing in need of more assistance in AL.     Family/ staff Communication:  staff, left message with her daughter [name]  Labs/tests ordered:   Left hip xray, right lower ext doppler.

## 2018-12-07 ENCOUNTER — Encounter: Payer: Self-pay | Admitting: Adult Health

## 2018-12-07 ENCOUNTER — Non-Acute Institutional Stay (SKILLED_NURSING_FACILITY): Payer: Medicare Other | Admitting: Adult Health

## 2018-12-07 DIAGNOSIS — R609 Edema, unspecified: Secondary | ICD-10-CM | POA: Diagnosis not present

## 2018-12-07 DIAGNOSIS — I872 Venous insufficiency (chronic) (peripheral): Secondary | ICD-10-CM | POA: Diagnosis not present

## 2018-12-07 DIAGNOSIS — M25559 Pain in unspecified hip: Secondary | ICD-10-CM | POA: Diagnosis not present

## 2018-12-07 DIAGNOSIS — D649 Anemia, unspecified: Secondary | ICD-10-CM | POA: Diagnosis not present

## 2018-12-07 DIAGNOSIS — M25552 Pain in left hip: Secondary | ICD-10-CM

## 2018-12-07 LAB — CBC AND DIFFERENTIAL
HCT: 41 (ref 36–46)
Hemoglobin: 14.3 (ref 12.0–16.0)
Platelets: 172 (ref 150–399)
WBC: 5.2

## 2018-12-07 NOTE — Progress Notes (Signed)
Location:  Occupational psychologist of Service:  SNF (31) Provider:   Cindi Carbon, ANP [facility] [phone]   Gayland Curry, DO  Patient Care Team: Gayland Curry, DO as PCP - General (Geriatric Medicine) Jola Schmidt, MD as Consulting Physician (Ophthalmology) Tat, Eustace Quail, DO as Consulting Physician (Neurology) Martinique, Peter M, MD as Consulting Physician (Cardiology)  Extended Emergency Contact Information Primary Emergency Contact: [name] Address: [address]          [name] Phone: [phone] Mobile Phone: [phone] Relation: Daughter  Code Status:  DNR Goals of care: Advanced Directive information Advanced Directives 09/16/2018  Does Patient Have a Medical Advance Directive? Yes  Type of Paramedic of [place];Out of facility DNR (pink MOST or yellow form);Living will  Does patient want to make changes to medical advance directive? No - Patient declined  Copy of [place] in Chart? Yes - validated most recent copy scanned in chart (See row information)  Would patient like information on creating a medical advance directive? -  Pre-existing out of facility DNR order (yellow form or pink MOST form) Yellow form placed in chart (order not valid for inpatient use)     Chief Complaint  Patient presents with  . Acute Visit    f/u left hip pain and leg swelling on the right     HPI:  Pt is a 85 y.o. female seen today for an acute visit for left hip pain and right leg swelling. On 7/10 Ms. Traxler was seen for these problems and moved from AL to skilled due to increased need for assistance and inability to bear weight for transfer. A left hip and pelvis xray was ordered which showed no acute fracture. Over the weekend the staff used a lift for transfers to the recliner. She has no pain at rest but continues to have severe pain on the left hip  with attempts to stand, that is not radicular with no associated numbness or tingling. There was no hx of mechanical fall or bruising or swelling.  She also has chronic venous insuff worse on the right than the left. A doppler of the right leg was ordered along with a CBC due to increased swelling and redness.  The doppler was neg for DVT. She has no pain in the right leg. No fever or drainage. CBC WNL. Swelling is improved 12/07/2018 after one dose of lasix 10 mg.    Past Medical History:  Diagnosis Date  . Aortic stenosis, mild   . Constipation, chronic   . Gilbert syndrome   . HTN (hypertension)   . Hyperlipidemia   . Hypothyroidism   . Melanoma ([place])   . Neuropathy   . Osteoporosis   . Parkinson's disease ([place])   . Peripheral neuropathy    Bilateral  . Rheumatic fever    does not take Flu shot    Past Surgical History:  Procedure Laterality Date  . ABDOMINAL HYSTERECTOMY  1963   no BSO; no abnormal PAPs  . COLONOSCOPY  2000   negative; [facility]  . [place]  . MELANOMA EXCISION  08/2014    Allergies  Allergen Reactions  . Sulfonamide Derivatives     REACTION:generalized  swelling  . Gabapentin Hypertension and Swelling  . Sulfa Antibiotics Swelling    Outpatient Encounter Medications as of 12/07/2018  Medication Sig  . acetaminophen (TYLENOL) 325 MG tablet Take  2 tablets (650 mg total) by mouth at bedtime as needed for mild pain.  Marland Kitchen acetaminophen (TYLENOL) 325 MG tablet Take 325 mg by mouth every 6 (six) hours as needed for mild pain.  Marland Kitchen amLODipine (NORVASC) 5 MG tablet Take 5 mg by mouth daily.  . Calcium Carbonate-Vitamin D (CALCIUM 600/VITAMIN D PO) Take 1 tablet by mouth daily.   . carbidopa-levodopa (SINEMET IR) 25-100 MG tablet 2 in the morning, 1 in the afternoon, 1 in the evening  . Cholecalciferol (VITAMIN D) 2000 units CAPS Take 1 capsule by mouth daily.  . furosemide (LASIX) 20 MG tablet Take 10 mg by mouth as needed.   Marland Kitchen levothyroxine  (SYNTHROID, LEVOTHROID) 50 MCG tablet Take one on Sunday, Monday, Wednesday, and Friday 75 mcg other days  . levothyroxine (SYNTHROID, LEVOTHROID) 75 MCG tablet Take 1 on Tuesday, Thursday, saturday  . magnesium citrate SOLN Take 1 Bottle by mouth daily as needed.   . Magnesium Sulfate, Laxative, (EPSOM SALT) GRAN To soak feet daily  . Multiple Vitamin (MULTIVITAMIN) capsule Take 1 capsule by mouth daily.    Marland Kitchen omeprazole (PRILOSEC) 20 MG capsule Take 20 mg by mouth daily.   . potassium chloride (K-DUR) 10 MEQ tablet Take 10 mEq by mouth as needed. Use when taking Lasix  . traMADol (ULTRAM) 50 MG tablet Take 0.5 tablets (25 mg total) by mouth every 6 (six) hours as needed.   No facility-administered encounter medications on file as of 12/07/2018.     Review of Systems  Constitutional: Negative for activity change, appetite change, chills, diaphoresis, fatigue, fever and unexpected weight change.  HENT: Negative for congestion.   Respiratory: Negative for cough, shortness of breath and wheezing.   Cardiovascular: Positive for leg swelling. Negative for chest pain and palpitations.  Gastrointestinal: Negative for abdominal distention, abdominal pain, constipation and diarrhea.  Genitourinary: Negative for difficulty urinating and dysuria.  Musculoskeletal: Positive for arthralgias and gait problem. Negative for back pain, joint swelling and myalgias.       Left hip pain.   Neurological: Positive for weakness. Negative for dizziness, tremors, seizures, syncope, facial asymmetry, speech difficulty, light-headedness, numbness and headaches.       Foot neuropathy   Psychiatric/Behavioral: Positive for confusion. Negative for agitation and behavioral problems.       Delusions chronic    Immunization History  Administered Date(s) Administered  . Influenza-Unspecified 03/26/2018  . PPD Test 04/29/2016  . Tdap 08/11/2014   Pertinent  Health Maintenance Due  Topic Date Due  . DEXA SCAN   Completed  . INFLUENZA VACCINE  Discontinued  . PNA vac Low Risk Adult  Discontinued   Fall Risk  09/16/2018 05/13/2018 05/11/2018 03/18/2018 02/18/2018  Falls in the past year? 0 1 0 Yes Yes  Number falls in past yr: 0 0 0 1 1  Comment - - - - -  Injury with Fall? 0 0 0 No No  Comment - - - - -  Risk Factor Category  - - - - -  Follow up - - Falls evaluation completed - -   Functional Status Survey:    There were no vitals filed for this visit. There is no height or weight on file to calculate BMI. Physical Exam Vitals signs and nursing note reviewed.  Constitutional:      General: She is not in acute distress.    Appearance: She is not diaphoretic.  HENT:     Head: Normocephalic and atraumatic.  Neck:  Vascular: No JVD.  Cardiovascular:     Rate and Rhythm: Normal rate and regular rhythm.     Heart sounds: No murmur.  Pulmonary:     Effort: Pulmonary effort is normal. No respiratory distress.     Breath sounds: Normal breath sounds. No wheezing.  Abdominal:     General: Bowel sounds are normal. There is distension (chronic).     Palpations: Abdomen is soft.  Musculoskeletal:        General: Deformity (kyphosis) present.     Right lower leg: Edema (R>L improved since last 7/10) present.     Left lower leg: Edema present.  Skin:    General: Skin is warm and dry.     Findings: Erythema (RLE, no wounds or drainage no tenderenes no warmth ) present.  Neurological:     General: No focal deficit present.     Mental Status: She is alert. Mental status is at baseline.  Psychiatric:     Comments: tearful     Labs reviewed: Recent Labs    02/17/18 0259 04/11/18 0700  NA 141 144  K 4.5 4.3  BUN 22* 20  CREATININE 0.9 0.7   Recent Labs    04/11/18 0700  AST 21  ALT 5*  ALKPHOS 56   Recent Labs    02/17/18 0259 04/11/18 0700  WBC 5.9 5.2  HGB 14.1 14.3  HCT 42 43  PLT 183 170   Lab Results  Component Value Date   TSH 4.07 05/27/2018   No results  found for: HGBA1C Lab Results  Component Value Date   CHOL 199 08/12/2014   HDL 87 08/12/2014   LDLCALC 104 (H) 08/12/2014   LDLDIRECT 105.9 06/15/2013   TRIG 38 08/12/2014   CHOLHDL 2.3 08/12/2014    Significant Diagnostic Results in last 30 days:  No results found.  Assessment/Plan 1. Left hip pain ? Pelvic fracture not easily visible on xray due to a hx of osteoporosis I am going to obtain one more xray and if no fracture is noted will consider CT or progress with therapy and close monitoring.  Staff are allowed to have her stand and pivot transfer as she is able. Continue tylenol and ultram for pain.   2. Venous insufficiency Chronically worse on the right  Improved swelling of the right leg. No signs of acute infection. Continue to monitor. Continue elevation and compression.  Consider tapering off norvasc  Family/ staff Communication: I called and discussed her care with her daughter [name].   Labs/tests ordered:  NA

## 2018-12-08 ENCOUNTER — Encounter: Payer: Self-pay | Admitting: Internal Medicine

## 2018-12-08 ENCOUNTER — Non-Acute Institutional Stay (SKILLED_NURSING_FACILITY): Payer: Medicare Other | Admitting: Internal Medicine

## 2018-12-08 DIAGNOSIS — R1032 Left lower quadrant pain: Secondary | ICD-10-CM | POA: Diagnosis not present

## 2018-12-08 DIAGNOSIS — M7989 Other specified soft tissue disorders: Secondary | ICD-10-CM

## 2018-12-08 DIAGNOSIS — R2689 Other abnormalities of gait and mobility: Secondary | ICD-10-CM | POA: Diagnosis not present

## 2018-12-08 DIAGNOSIS — G20 Parkinson's disease: Secondary | ICD-10-CM | POA: Diagnosis not present

## 2018-12-08 DIAGNOSIS — F0280 Dementia in other diseases classified elsewhere without behavioral disturbance: Secondary | ICD-10-CM | POA: Diagnosis not present

## 2018-12-08 DIAGNOSIS — I872 Venous insufficiency (chronic) (peripheral): Secondary | ICD-10-CM | POA: Diagnosis not present

## 2018-12-08 DIAGNOSIS — G20A1 Parkinson's disease without dyskinesia, without mention of fluctuations: Secondary | ICD-10-CM

## 2018-12-08 DIAGNOSIS — K5909 Other constipation: Secondary | ICD-10-CM | POA: Diagnosis not present

## 2018-12-08 NOTE — Progress Notes (Signed)
Provider:  Rexene Edison. Mariea Clonts, D.O., C.M.D. Location:  [facility] Room Number: [place] of Service:  SNF (31)  PCP: Gayland Curry, DO Patient Care Team: Gayland Curry, DO as PCP - General (Geriatric Medicine) Jola Schmidt, MD as Consulting Physician (Ophthalmology) Tat, Eustace Quail, DO as Consulting Physician (Neurology) Martinique, Peter M, MD as Consulting Physician (Cardiology)  Extended Emergency Contact Information Primary Emergency Contact: [name] Address: [address]          [place] Phone: [phone] Mobile Phone: [phone] Relation: Daughter  Code Status: DNR Goals of Care: Advanced Directive information Advanced Directives 09/16/2018  Does Patient Have a Medical Advance Directive? Yes  Type of Paramedic of [place];Out of facility DNR (pink MOST or yellow form);Living will  Does patient want to make changes to medical advance directive? No - Patient declined  Copy of [place] in Chart? Yes - validated most recent copy scanned in chart (See row information)  Would patient like information on creating a medical advance directive? -  Pre-existing out of facility DNR order (yellow form or pink MOST form) Yellow form placed in chart (order not valid for inpatient use)   Chief Complaint  Patient presents with  . New Admit To SNF    new admission    HPI: Patient is a 85 y.o. female AL resident with Parkinson's and venous insufficiency seen today for admission to SNF (7/10) due to inability to bear weight and sudden increased swelling of her right greater than left leg (well beyond the norm) and left hip pain.  She had no known fall.  She had been struggling to bear weight and a lift was being used.  Oddly, this am, she's been able to get up with assistance and got herself all ready this morning with the help of the CNA.    The swelling of the  right leg was ruled out for DVT and she received lasix with improvement which has also helped before.  She is now eager to return to her AL apt.  Whether this is a permanent move to SNF remains to be determined.    When I saw her, she did not really understand why she was in SNF.  She did admit to having had pain in her left groin area "a few days ago" which had resolved.  She was able, with great effort, to stand from her recliner and put both arms on her rollator and take a few steps without any signs of pain.  She flopped back into her chair after a few festinating steps backward rather than a gradual transfer back to her chair.    Bowels are moving with her usual mag citrate prn as the other regimens have been ineffective.    Lab Results  Component Value Date   TSH 4.07 05/27/2018   She denies any pain now whatsoever, but does have nightly and q6 hr tylenol available if needed and tramadol if pain is severe.    Past Medical History:  Diagnosis Date  . Aortic stenosis, mild   . Constipation, chronic   . Gilbert syndrome   . HTN (hypertension)   . Hyperlipidemia   . Hypothyroidism   . Melanoma ([place])   . Neuropathy   . Osteoporosis   . Parkinson's disease ([place])   . Peripheral neuropathy    Bilateral  . Rheumatic fever    does not take Flu shot  Past Surgical History:  Procedure Laterality Date  . ABDOMINAL HYSTERECTOMY  1963   no BSO; no abnormal PAPs  . COLONOSCOPY  2000   negative; [place]  . [place]  . MELANOMA EXCISION  08/2014    Social History   Socioeconomic History  . Marital status: Widowed    Spouse name: Not on file  . Number of children: Not on file  . Years of education: Not on file  . Highest education level: Not on file  Occupational History  . Occupation: retired own [employer]  . Financial resource strain: Not hard at all  . Food insecurity    Worry: Never true    Inability: Never true  .  Transportation needs    Medical: No    Non-medical: No  Tobacco Use  . Smoking status: Never Smoker  . Smokeless tobacco: Never Used  Substance and Sexual Activity  . Alcohol use: No  . Drug use: No  . Sexual activity: Not on file  Lifestyle  . Physical activity    Days per week: 7 days    Minutes per session: 20 min  . Stress: Not at all  Relationships  . Social Herbalist on phone: Three times a week    Gets together: Three times a week    Attends religious service: Never    Active member of club or organization: No    Attends meetings of clubs or organizations: Never    Relationship status: Widowed  Other Topics Concern  . Not on file  Social History Narrative   Lives at [facility] 01/05/2015   Widowed   Never smoked   Exercise walking   Alcohol none   POA/Living Will       reports that she has never smoked. She has never used smokeless tobacco. She reports that she does not drink alcohol or use drugs.  Functional Status Survey:    Family History  Problem Relation Age of Onset  . Dementia Mother        Alzheimers  . Stroke Mother        ? 63  . Cancer Son        multiple myeloma  . Pancreatic cancer Son   . Heart disease Brother   . Lung cancer Maternal Uncle        smoker  . Heart attack Neg Hx   . Diabetes Neg Hx   . Hearing loss Neg Hx     Health Maintenance  Topic Date Due  . TETANUS/TDAP  08/10/2024  . DEXA SCAN  Completed  . INFLUENZA VACCINE  Discontinued  . PNA vac Low Risk Adult  Discontinued    Allergies  Allergen Reactions  . Sulfonamide Derivatives     REACTION:generalized  swelling  . Gabapentin Hypertension and Swelling  . Sulfa Antibiotics Swelling    Outpatient Encounter Medications as of 12/08/2018  Medication Sig  . acetaminophen (TYLENOL) 325 MG tablet Take 2 tablets (650 mg total) by mouth at bedtime as needed for mild pain.  Marland Kitchen acetaminophen (TYLENOL) 325 MG tablet Take 325 mg by mouth every 6 (six)  hours as needed for mild pain.  Marland Kitchen amLODipine (NORVASC) 5 MG tablet Take 5 mg by mouth daily.  . Calcium Carbonate-Vitamin D (CALCIUM 600/VITAMIN D PO) Take 1 tablet by mouth daily.   . carbidopa-levodopa (SINEMET IR) 25-100 MG tablet 2 in the morning, 1 in the afternoon, 1 in the  evening  . Cholecalciferol (VITAMIN D) 2000 units CAPS Take 1 capsule by mouth daily.  . furosemide (LASIX) 20 MG tablet Take 10 mg by mouth as needed.   Marland Kitchen levothyroxine (SYNTHROID, LEVOTHROID) 50 MCG tablet Take one on Sunday, Monday, Wednesday, and Friday 75 mcg other days  . levothyroxine (SYNTHROID, LEVOTHROID) 75 MCG tablet Take 1 on Tuesday, Thursday, saturday  . magnesium citrate SOLN Take 1 Bottle by mouth daily as needed.   . Magnesium Sulfate, Laxative, (EPSOM SALT) GRAN To soak feet daily  . Multiple Vitamin (MULTIVITAMIN) capsule Take 1 capsule by mouth daily.    Marland Kitchen omeprazole (PRILOSEC) 20 MG capsule Take 20 mg by mouth daily.   . potassium chloride (K-DUR) 10 MEQ tablet Take 10 mEq by mouth as needed. Use when taking Lasix  . traMADol (ULTRAM) 50 MG tablet Take 0.5 tablets (25 mg total) by mouth every 6 (six) hours as needed.   No facility-administered encounter medications on file as of 12/08/2018.     Review of Systems  Constitutional: Negative for chills, fever, malaise/fatigue and weight loss.  HENT: Positive for hearing loss.   Eyes: Negative for blurred vision.       Glasses at times  Respiratory: Negative for cough and shortness of breath.   Cardiovascular: Positive for leg swelling. Negative for chest pain and palpitations.  Gastrointestinal: Positive for constipation.  Genitourinary: Negative for dysuria.  Musculoskeletal: Negative for falls and joint pain.  Skin: Negative for itching and rash.  Neurological: Positive for tremors. Negative for dizziness and loss of consciousness.       Festinating gait, difficulty with freezing at times that leads to falls  Psychiatric/Behavioral:  Positive for memory loss. Negative for depression. The patient is not nervous/anxious and does not have insomnia.     Vitals:   12/08/18 1259  BP: 112/70  Pulse: 64  Resp: 20  Temp: 97.6 F (36.4 C)  TempSrc: Oral  SpO2: 97%  Weight: 130 lb (59 kg)  Height: 5' (1.524 m)   Body mass index is 25.39 kg/m. Physical Exam Vitals signs reviewed.  Constitutional:      General: She is not in acute distress.    Appearance: Normal appearance. She is not ill-appearing or toxic-appearing.  HENT:     Head: Normocephalic and atraumatic.     Right Ear: External ear normal.     Left Ear: External ear normal.     Nose: Nose normal.     Mouth/Throat:     Pharynx: Oropharynx is clear.  Eyes:     Extraocular Movements: Extraocular movements intact.     Conjunctiva/sclera: Conjunctivae normal.     Pupils: Pupils are equal, round, and reactive to light.  Neck:     Musculoskeletal: Neck supple.  Cardiovascular:     Rate and Rhythm: Normal rate and regular rhythm.     Pulses: Normal pulses.     Heart sounds: Murmur present.  Pulmonary:     Effort: Pulmonary effort is normal.     Breath sounds: Normal breath sounds. No rales.  Abdominal:     General: Bowel sounds are normal. There is distension.     Palpations: There is no mass.     Tenderness: There is no abdominal tenderness. There is no guarding or rebound.     Comments: Chronic prominent abdomen with kyphoscoliosis  Musculoskeletal:     Comments: Bradykinesia, stooped posture, then festination when gets started; uses rollator walker; currently seems weak getting up out of her chair vs  baseline  Skin:    General: Skin is warm and dry.     Capillary Refill: Capillary refill takes less than 2 seconds.  Neurological:     Mental Status: She is alert. Mental status is at baseline. She is disoriented.     Cranial Nerves: No cranial nerve deficit.     Motor: Weakness present.     Gait: Gait abnormal.     Deep Tendon Reflexes: Reflexes  normal.     Comments: Knows she's not in her apt, but does not know why she is in "rehab"  Psychiatric:        Mood and Affect: Mood normal.     Labs reviewed: Basic Metabolic Panel: Recent Labs    02/17/18 0259 04/11/18 0700  NA 141 144  K 4.5 4.3  BUN 22* 20  CREATININE 0.9 0.7   Liver Function Tests: Recent Labs    04/11/18 0700  AST 21  ALT 5*  ALKPHOS 56   No results for input(s): LIPASE, AMYLASE in the last 8760 hours. No results for input(s): AMMONIA in the last 8760 hours. CBC: Recent Labs    02/17/18 0259 04/11/18 0700  WBC 5.9 5.2  HGB 14.1 14.3  HCT 42 43  PLT 183 170   Cardiac Enzymes: No results for input(s): CKTOTAL, CKMB, CKMBINDEX, TROPONINI in the last 8760 hours. BNP: Invalid input(s): POCBNP No results found for: HGBA1C Lab Results  Component Value Date   TSH 4.07 05/27/2018   Lab Results  Component Value Date   IRCVELFY10 175 08/11/2014   No results found for: FOLATE No results found for: IRON, TIBC, FERRITIN  Imaging and Procedures obtained prior to SNF admission: Mr Brain Wo Contrast  Result Date: 12/22/2016 CLINICAL DATA:  Hallucinations. Left leg weakness. Gait disturbance. Symptoms worsening for 2-3 weeks. History of melanoma. EXAM: MRI HEAD WITHOUT CONTRAST TECHNIQUE: Multiplanar, multiecho pulse sequences of the brain and surrounding structures were obtained without intravenous contrast. COMPARISON:  Head CTs 12/07/2016 FINDINGS: Some sequences are mildly to moderately motion degraded. Brain: There is no evidence of acute infarct, mass, midline shift, or extra-axial fluid collection. There is moderate to advanced cerebral atrophy and moderate cerebellar atrophy. Patchy to confluent T2 hyperintensities throughout the cerebral white matter bilaterally are nonspecific but compatible with extensive chronic small vessel ischemic disease. There are innumerable chronic microhemorrhages located predominantly superficially throughout both  cerebral hemispheres. One or two chronic cerebellar microhemorrhages are noted as well. Vascular: Major intracranial vascular flow voids are preserved. Skull and upper cervical spine: Unremarkable bone marrow signal. Sinuses/Orbits: Unremarkable orbits. Minimal bilateral maxillary sinus mucosal thickening. Clear mastoid air cells. Other: None. IMPRESSION: 1. No acute intracranial abnormality. 2. Advanced chronic small vessel ischemic disease and cerebral atrophy. 3. Numerous chronic cerebral microhemorrhages suggestive of cerebral amyloid angiopathy. Electronically Signed   By: Logan Bores M.D.   On: 12/22/2016 11:06    Assessment/Plan 1. Right leg swelling -better after diuresis--?ate salty foods--she has a propensity for things like bacon and sausage and they previously made this occur so we need to be sure she's not overindulging on sodium  2. Left groin pain -better--?muscular or bowel related  3. Unable to bear weight -seems this resolved and pelvic xrays x 2 negative for fx -we opted not to pursue CT now due to improvement and fact that either way tx would be gradual increased activity supervised by PT -cont PT and see how she does over the next several days to determine if she should stay here  in SNF long-term  4. Chronic venous insufficiency -was recently worse due to patient's decreased activity related to covid isolation -has been elevating feet and wearing compression hose  5. Parkinson's disease ([place]) -gradually progressive with more difficulty with cognition, festination, freezing -cont current sinemet regimen   6. 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 Chronic constipation -continues on  prn mag citrate when she has difficulty with this  Family/ staff Communication: discussed with snf nurse and cna  Labs/tests ordered:  No new  Ernesto Lashway L. Mariza Bourget, D.O. [facility] [address], [place] [address] Cell Phone (Mon-Fri 8am-5pm):  [phone] On Call:  [phone] & follow prompts after 5pm & weekends Office Phone:  [phone] Office Fax:  [phone]

## 2019-01-13 ENCOUNTER — Encounter: Payer: Self-pay | Admitting: Internal Medicine

## 2019-01-13 ENCOUNTER — Other Ambulatory Visit: Payer: Self-pay

## 2019-01-13 ENCOUNTER — Non-Acute Institutional Stay: Payer: Medicare Other | Admitting: Internal Medicine

## 2019-01-13 VITALS — BP 120/60 | HR 60 | Temp 98.0°F | Ht 60.0 in | Wt 129.0 lb

## 2019-01-13 DIAGNOSIS — K5909 Other constipation: Secondary | ICD-10-CM | POA: Diagnosis not present

## 2019-01-13 DIAGNOSIS — I350 Nonrheumatic aortic (valve) stenosis: Secondary | ICD-10-CM | POA: Diagnosis not present

## 2019-01-13 DIAGNOSIS — F0280 Dementia in other diseases classified elsewhere without behavioral disturbance: Secondary | ICD-10-CM | POA: Diagnosis not present

## 2019-01-13 DIAGNOSIS — G20A1 Parkinson's disease without dyskinesia, without mention of fluctuations: Secondary | ICD-10-CM

## 2019-01-13 DIAGNOSIS — I872 Venous insufficiency (chronic) (peripheral): Secondary | ICD-10-CM | POA: Diagnosis not present

## 2019-01-13 DIAGNOSIS — G20 Parkinson's disease: Secondary | ICD-10-CM

## 2019-01-13 DIAGNOSIS — E034 Atrophy of thyroid (acquired): Secondary | ICD-10-CM

## 2019-01-13 NOTE — Progress Notes (Signed)
Location:  [facility] clinic Provider:  Yaretzy Olazabal L. Mariea Clonts, D.O., C.M.D.  Code Status: DNR Goals of Care:  Advanced Directives 09/16/2018  Does Patient Have a Medical Advance Directive? Yes  Type of Paramedic of [place];Out of facility DNR (pink MOST or yellow form);Living will  Does patient want to make changes to medical advance directive? No - Patient declined  Copy of [place] in Chart? Yes - validated most recent copy scanned in chart (See row information)  Would patient like information on creating a medical advance directive? -  Pre-existing out of facility DNR order (yellow form or pink MOST form) Yellow form placed in chart (order not valid for inpatient use)   Chief Complaint  Patient presents with  . Medical Management of Chronic Issues    42mth follow-up    HPI: Patient is a 85 y.o. female seen today for medical management of chronic diseases.    I had seen Natalie Lam last month when she was "in rehab" temporarily due to right leg swelling and resulted in difficulty bearing weight.  She received some PT and got back to her baseline.  She did return to AL.    Staff just completed a needs assessment on her and she scores at the very upper end of the moderate impairment section suggesting a need for a higher level of care.  There had been some discussion of a permanent move when she was in SNF for the above episode.  Since I saw her 7/14 in rehab, she was found on the floor once in her apt by AL nursing, uninjured on 8/11--she says she was going around looking at her books in her apt--she could not get back up and had to call someone.  She thinks she is doing great.  Nothing hurts.    She is putting her feet up daily after lunch.  Right stays more swollen than left.  DVT was ruled out last month again.  She is able to move her bowels only with the mag citrate daily (it's every 3 days) as needed.  She eats an apple daily, some prunes, avoids  most bread.    She tries to get her walking in for exercise and knows that is the best thing for her with her Parkinson's.    Past Medical History:  Diagnosis Date  . Aortic stenosis, mild   . Constipation, chronic   . Gilbert syndrome   . HTN (hypertension)   . Hyperlipidemia   . Hypothyroidism   . Melanoma ([place])   . Neuropathy   . Osteoporosis   . Parkinson's disease ([place])   . Peripheral neuropathy    Bilateral  . Rheumatic fever    does not take Flu shot     Past Surgical History:  Procedure Laterality Date  . ABDOMINAL HYSTERECTOMY  1963   no BSO; no abnormal PAPs  . COLONOSCOPY  2000   negative; [place]  . [place]  . MELANOMA EXCISION  08/2014    Allergies  Allergen Reactions  . Sulfonamide Derivatives     REACTION:generalized  swelling  . Gabapentin Hypertension and Swelling  . Sulfa Antibiotics Swelling    Outpatient Encounter Medications as of 01/13/2019  Medication Sig  . acetaminophen (TYLENOL) 325 MG tablet Take 2 tablets (650 mg total) by mouth at bedtime as needed for mild pain.  Marland Kitchen acetaminophen (TYLENOL) 325 MG tablet Take 325 mg by mouth every 6 (six) hours as needed for mild  pain.  . amLODipine (NORVASC) 5 MG tablet Take 5 mg by mouth daily.  . Calcium Carbonate-Vitamin D (CALCIUM 600/VITAMIN D PO) Take 1 tablet by mouth daily.   . carbidopa-levodopa (SINEMET IR) 25-100 MG tablet 2 in the morning, 1 in the afternoon, 1 in the evening  . Cholecalciferol (VITAMIN D) 2000 units CAPS Take 1 capsule by mouth daily.  . furosemide (LASIX) 20 MG tablet Take 10 mg by mouth as needed.   Marland Kitchen levothyroxine (SYNTHROID, LEVOTHROID) 50 MCG tablet Take one on Sunday, Monday, Wednesday, and Friday 75 mcg other days  . levothyroxine (SYNTHROID, LEVOTHROID) 75 MCG tablet Take 1 on Tuesday, Thursday, saturday  . magnesium citrate SOLN Take 1 Bottle by mouth daily as needed.   . Magnesium Sulfate, Laxative, (EPSOM SALT) GRAN To soak feet daily  . Multiple  Vitamin (MULTIVITAMIN) capsule Take 1 capsule by mouth daily.    Marland Kitchen omeprazole (PRILOSEC) 20 MG capsule Take 20 mg by mouth daily.   . potassium chloride (K-DUR) 10 MEQ tablet Take 10 mEq by mouth as needed. Use when taking Lasix  . traMADol (ULTRAM) 50 MG tablet Take 0.5 tablets (25 mg total) by mouth every 6 (six) hours as needed.   No facility-administered encounter medications on file as of 01/13/2019.     Review of Systems:  Review of Systems  Constitutional: Negative for chills, fever and malaise/fatigue.  HENT: Positive for hearing loss. Negative for congestion.   Eyes: Negative for blurred vision.  Respiratory: Negative for cough and shortness of breath.   Cardiovascular: Negative for chest pain, palpitations and leg swelling.  Gastrointestinal: Positive for constipation. Negative for abdominal pain, blood in stool, diarrhea and melena.  Genitourinary: Negative for dysuria.  Musculoskeletal: Positive for falls. Negative for joint pain.  Skin: Negative for itching and rash.  Neurological: Negative for dizziness and loss of consciousness.  Endo/Heme/Allergies: Bruises/bleeds easily.  Psychiatric/Behavioral: Positive for memory loss. Negative for depression. The patient is not nervous/anxious and does not have insomnia.     Health Maintenance  Topic Date Due  . TETANUS/TDAP  08/10/2024  . DEXA SCAN  Completed  . INFLUENZA VACCINE  Discontinued  . PNA vac Low Risk Adult  Discontinued    Physical Exam: Vitals:   01/13/19 1104  BP: 120/60  Pulse: 60  Temp: 98 F (36.7 C)  TempSrc: Oral  SpO2: 93%  Weight: 129 lb (58.5 kg)  Height: 5' (1.524 m)   Body mass index is 25.19 kg/m. Physical Exam Vitals signs and nursing note reviewed.  Constitutional:      General: She is not in acute distress.    Appearance: Normal appearance. She is normal weight. She is not ill-appearing or toxic-appearing.     Comments: Came down in wheelchair brought by CNA  HENT:     Head:  Normocephalic and atraumatic.     Ears:     Comments: HOH Eyes:     Extraocular Movements: Extraocular movements intact.     Pupils: Pupils are equal, round, and reactive to light.  Cardiovascular:     Rate and Rhythm: Normal rate and regular rhythm.     Pulses: Normal pulses.     Heart sounds: Normal heart sounds.  Pulmonary:     Effort: Pulmonary effort is normal.     Breath sounds: Normal breath sounds.  Abdominal:     General: Bowel sounds are normal. There is no distension.     Palpations: Abdomen is soft. There is no mass.  Tenderness: There is no abdominal tenderness.     Comments: Chronic abdominal prominence  Musculoskeletal: Normal range of motion.     Comments: Severe kyphosis  Skin:    General: Skin is warm and dry.     Capillary Refill: Capillary refill takes less than 2 seconds.  Neurological:     General: No focal deficit present.     Mental Status: She is alert. Mental status is at baseline.     Cranial Nerves: No cranial nerve deficit.     Gait: Gait abnormal.     Comments: Gait not assessed today due to coming in wheelchair  Psychiatric:        Mood and Affect: Mood normal.     Labs reviewed: Basic Metabolic Panel: Recent Labs    02/17/18 0259 04/11/18 0700 05/27/18  NA 141 144  --   K 4.5 4.3  --   BUN 22* 20  --   CREATININE 0.9 0.7  --   TSH  --  5.83 4.07   Liver Function Tests: Recent Labs    04/11/18 0700  AST 21  ALT 5*  ALKPHOS 56   No results for input(s): LIPASE, AMYLASE in the last 8760 hours. No results for input(s): AMMONIA in the last 8760 hours. CBC: Recent Labs    02/17/18 0259 04/11/18 0700  WBC 5.9 5.2  HGB 14.1 14.3  HCT 42 43  PLT 183 170   Assessment/Plan 1. Parkinson's disease ([place]) -gradually progressive, cont current sinemet regimen and ambulation short distances with walker, transport longer distances in wheelchair -needs some walking for exercise and to stay mobile  2. Dementia associated with  Parkinson's disease ([place]) -mild to moderate -no reported paranoid delusions lately -functional status declining--is borderline with her needs assessment score  3. Chronic constipation -ongoing, uses mag citrate after no bms in 3 days, does use prunes, fiber, hydration, as well  4. Chronic venous insufficiency -cont compression hose and elevation of feet after lunch each day, right remains larger than left; was negative for DVT last month  5. Aortic valve stenosis, etiology of cardiac valve disease unspecified -asymptomatic and no notable change on exam, monitor  6. Hypothyroidism due to acquired atrophy of thyroid -at goal, cont same levothyroxine Lab Results  Component Value Date   TSH 4.07 05/27/2018    Labs/tests ordered:  Cbc, cmp, tsh before Next appt:  4 mos med mgt   Natalie Lam L. Anaya Bovee, D.O. [facility] [address], [place] [address] Cell Phone (Mon-Fri 8am-5pm):  [phone] On Call:  [phone] & follow prompts after 5pm & weekends Office Phone:  [phone] Office Fax:  [phone]

## 2019-02-01 DIAGNOSIS — R296 Repeated falls: Secondary | ICD-10-CM | POA: Diagnosis not present

## 2019-02-01 DIAGNOSIS — D649 Anemia, unspecified: Secondary | ICD-10-CM | POA: Diagnosis not present

## 2019-02-01 DIAGNOSIS — R531 Weakness: Secondary | ICD-10-CM | POA: Diagnosis not present

## 2019-02-01 DIAGNOSIS — Z79899 Other long term (current) drug therapy: Secondary | ICD-10-CM | POA: Diagnosis not present

## 2019-02-01 DIAGNOSIS — R319 Hematuria, unspecified: Secondary | ICD-10-CM | POA: Diagnosis not present

## 2019-02-01 DIAGNOSIS — N390 Urinary tract infection, site not specified: Secondary | ICD-10-CM | POA: Diagnosis not present

## 2019-02-01 DIAGNOSIS — G20 Parkinson's disease: Secondary | ICD-10-CM | POA: Diagnosis not present

## 2019-02-01 DIAGNOSIS — E039 Hypothyroidism, unspecified: Secondary | ICD-10-CM | POA: Diagnosis not present

## 2019-02-01 LAB — HEPATIC FUNCTION PANEL
ALT: 21 (ref 7–35)
AST: 20 (ref 13–35)
Alkaline Phosphatase: 77 (ref 25–125)
Bilirubin, Total: 0.8

## 2019-02-01 LAB — BASIC METABOLIC PANEL
BUN: 16 (ref 4–21)
Creatinine: 0.8 (ref 0.5–1.1)
Glucose: 72
Potassium: 3.9 (ref 3.4–5.3)
Sodium: 145 (ref 137–147)

## 2019-02-01 LAB — CBC AND DIFFERENTIAL
HCT: 43 (ref 36–46)
Hemoglobin: 14.9 (ref 12.0–16.0)
Platelets: 155 (ref 150–399)
WBC: 6.1

## 2019-02-01 LAB — TSH: TSH: 3.06 (ref 0.41–5.90)

## 2019-02-02 ENCOUNTER — Other Ambulatory Visit: Payer: Self-pay | Admitting: Internal Medicine

## 2019-02-02 DIAGNOSIS — F419 Anxiety disorder, unspecified: Secondary | ICD-10-CM

## 2019-02-02 MED ORDER — LORAZEPAM 0.5 MG PO TABS: 0.2500 mg | ORAL_TABLET | Freq: Two times a day (BID) | ORAL | 0 refills | Status: DC | PRN

## 2019-02-02 NOTE — Progress Notes (Signed)
Resident was moved to memory care and is yelling out and very unhappy and anxious per Midwife.  Her daughter has requested anxiety medication to hep her adjust.  Low dose lorazepam prescribed for 14 days maximum.  Try to avoid use and redirect.  Increases fall risk.

## 2019-02-04 ENCOUNTER — Encounter: Payer: Self-pay | Admitting: Internal Medicine

## 2019-02-05 ENCOUNTER — Encounter: Payer: Self-pay | Admitting: Internal Medicine

## 2019-02-06 ENCOUNTER — Encounter: Payer: Self-pay | Admitting: Internal Medicine

## 2019-02-09 ENCOUNTER — Encounter: Payer: Self-pay | Admitting: Internal Medicine

## 2019-02-09 ENCOUNTER — Non-Acute Institutional Stay (SKILLED_NURSING_FACILITY): Payer: Medicare Other | Admitting: Internal Medicine

## 2019-02-09 DIAGNOSIS — N3000 Acute cystitis without hematuria: Secondary | ICD-10-CM | POA: Diagnosis not present

## 2019-02-09 DIAGNOSIS — F419 Anxiety disorder, unspecified: Secondary | ICD-10-CM

## 2019-02-09 DIAGNOSIS — G20 Parkinson's disease: Secondary | ICD-10-CM

## 2019-02-09 DIAGNOSIS — E034 Atrophy of thyroid (acquired): Secondary | ICD-10-CM | POA: Diagnosis not present

## 2019-02-09 DIAGNOSIS — F0280 Dementia in other diseases classified elsewhere without behavioral disturbance: Secondary | ICD-10-CM

## 2019-02-09 DIAGNOSIS — K5909 Other constipation: Secondary | ICD-10-CM

## 2019-02-09 DIAGNOSIS — Z85820 Personal history of malignant melanoma of skin: Secondary | ICD-10-CM

## 2019-02-09 DIAGNOSIS — I872 Venous insufficiency (chronic) (peripheral): Secondary | ICD-10-CM | POA: Diagnosis not present

## 2019-02-09 DIAGNOSIS — I350 Nonrheumatic aortic (valve) stenosis: Secondary | ICD-10-CM

## 2019-02-09 DIAGNOSIS — G20A1 Parkinson's disease without dyskinesia, without mention of fluctuations: Secondary | ICD-10-CM

## 2019-02-09 NOTE — Progress Notes (Signed)
Patient ID: Natalie Lam, female   DOB: 1931-10-09, 85 y.o.   MRN: [id number]  Provider:  Rexene Edison. Mariea Lam, D.O., C.Lam.D. Location:  [facility] Room Number: Memory Care 317-A Place of Service:  SNF (31)  PCP: Natalie Curry, DO Patient Care Team: Natalie Curry, DO as PCP - General (Geriatric Medicine) Natalie Schmidt, MD as Consulting Lam (Ophthalmology) Natalie Lam, Natalie Quail, DO as Consulting Lam (Neurology) Martinique, Peter M, MD as Consulting Lam (Cardiology)  Extended Emergency Contact Information Primary Emergency Contact: [name] Address: [address]          [place] Phone: [phone] Mobile Phone: [phone] Relation: Daughter  Code Status: DNR, MOST Goals of Care: Advanced Directive information Advanced Directives 09/16/2018  Does Patient Have a Medical Advance Directive? Yes  Type of Paramedic of [employer];Out of facility DNR (pink MOST or yellow form);Living will  Does patient want to make changes to medical advance directive? No - Patient declined  Copy of [place] in Chart? Yes - validated most recent copy scanned in chart (See row information)  Would patient like information on creating a medical advance directive? -  Pre-existing out of facility DNR order (yellow form or pink MOST form) Yellow form placed in chart (order not valid for inpatient use)      Chief Complaint  Patient presents with  . New Admit To SNF    New Admission to [facility], worsening dementia.     HPI: Patient is a 85 y.o. female with Parkinson's disease with increased freezing, festination and falls, dementia, h/o melanoma, chronic venous insufficiency seen today for admission to [facility] due to progressive memory loss.  She has moderate dementia due to her Parkinson's disease.  She is doing better in [facility] now.  Initially, she was very  anxious and upset and calling her family.  She required ativan once the day she came and one other time yesterday, but otherwise has been redirectable.  She currently thinks that the floor of her AL apt is being redone and she'll be going back, but I've not heard of this.  She says she is doing fine but is "homesick".  In usual sweet [name] style, she immediately asks how I'Lam doing.  She also says she's been told that she's here in memory care b/c her walking has gotten worse and she's been falling.  She is in a skilled bed technically here.  She tells me she's been doing extra walking to fix this.  Past Medical History:  Diagnosis Date  . Aortic stenosis, mild   . Constipation, chronic   . Gilbert syndrome   . HTN (hypertension)   . Hyperlipidemia   . Hypothyroidism   . Melanoma ([place])   . Neuropathy   . Osteoporosis   . Parkinson's disease ([place])   . Peripheral neuropathy    Bilateral  . Rheumatic fever    does not take Flu shot    Past Surgical History:  Procedure Laterality Date  . ABDOMINAL HYSTERECTOMY  1963   no BSO; no abnormal PAPs  . COLONOSCOPY  2000   negative; [place]  . [place]  . MELANOMA EXCISION  08/2014    reports that she has never smoked. She has never used smokeless tobacco. She reports that she does not drink alcohol or use drugs. Social History   Socioeconomic History  . Marital status: Widowed  Spouse name: Not on file  . Number of children: Not on file  . Years of education: Not on file  . Highest education level: Not on file  Occupational History  . Occupation: retired own [employer]  . Financial resource strain: Not hard at all  . Food insecurity    Worry: Never true    Inability: Never true  . Transportation needs    Medical: No    Non-medical: No  Tobacco Use  . Smoking status: Never Smoker  . Smokeless tobacco: Never Used  Substance and Sexual Activity  . Alcohol use: No  . Drug use: No  .  Sexual activity: Not on file  Lifestyle  . Physical activity    Days per week: 7 days    Minutes per session: 20 min  . Stress: Not at all  Relationships  . Social Herbalist on phone: Three times a week    Gets together: Three times a week    Attends religious service: Never    Active member of club or organization: No    Attends meetings of clubs or organizations: Never    Relationship status: Widowed  . Intimate partner violence    Fear of current or ex partner: No    Emotionally abused: No    Physically abused: No    Forced sexual activity: No  Other Topics Concern  . Not on file  Social History Narrative   Lives at [facility] 01/05/2015   Widowed   Never smoked   Exercise walking   Alcohol none   POA/Living Will       Functional Status Survey:    Family History  Problem Relation Age of Onset  . Dementia Mother        Alzheimers  . Stroke Mother        ? 46  . Cancer Son        multiple myeloma  . Pancreatic cancer Son   . Heart disease Brother   . Lung cancer Maternal Uncle        smoker  . Heart attack Neg Hx   . Diabetes Neg Hx   . Hearing loss Neg Hx     Health Maintenance  Topic Date Due  . TETANUS/TDAP  08/10/2024  . DEXA SCAN  Completed  . INFLUENZA VACCINE  Discontinued  . PNA vac Low Risk Adult  Discontinued    Allergies  Allergen Reactions  . Sulfonamide Derivatives     REACTION:generalized  swelling  . Gabapentin Hypertension and Swelling  . Sulfa Antibiotics Swelling    Outpatient Encounter Medications as of 02/09/2019  Medication Sig  . acetaminophen (TYLENOL) 325 MG tablet Take 2 tablets (650 mg total) by mouth at bedtime as needed for mild pain.  Marland Kitchen acetaminophen (TYLENOL) 325 MG tablet Take 325 mg by mouth every 6 (six) hours as needed for mild pain.  Marland Kitchen amLODipine (NORVASC) 5 MG tablet Take 5 mg by mouth daily.  . Calcium Carbonate-Vitamin D (CALCIUM 600/VITAMIN D PO) Take 1 tablet by mouth daily.   .  carbidopa-levodopa (SINEMET IR) 25-100 MG tablet 2 in the morning, 1 in the afternoon, 1 in the evening  . Cholecalciferol (VITAMIN D) 2000 units CAPS Take 1 capsule by mouth daily.  . furosemide (LASIX) 20 MG tablet Take 10 mg by mouth as needed.   Marland Kitchen levothyroxine (SYNTHROID, LEVOTHROID) 50 MCG tablet Take one on Sunday, Monday, Wednesday, and Friday 75 mcg  other days  . levothyroxine (SYNTHROID, LEVOTHROID) 75 MCG tablet Take 1 on Tuesday, Thursday, saturday  . LORazepam (ATIVAN) 0.5 MG tablet Take 0.5 tablets (0.25 mg total) by mouth 2 (two) times daily as needed for anxiety.  . magnesium citrate SOLN Take 1 Bottle by mouth daily as needed.   . Magnesium Sulfate, Laxative, (EPSOM SALT) GRAN To soak feet daily  . Multiple Vitamin (MULTIVITAMIN) capsule Take 1 capsule by mouth daily.    Marland Kitchen omeprazole (PRILOSEC) 20 MG capsule Take 20 mg by mouth daily.   . potassium chloride (K-DUR) 10 MEQ tablet Take 10 mEq by mouth as needed. Use when taking Lasix  . traMADol (ULTRAM) 50 MG tablet Take 0.5 tablets (25 mg total) by mouth every 6 (six) hours as needed.   No facility-administered encounter medications on file as of 02/09/2019.     Review of Systems  Constitutional: Negative for activity change, appetite change, chills and fever.  HENT: Negative for congestion and trouble swallowing.   Eyes: Negative for visual disturbance.  Respiratory: Negative for chest tightness and shortness of breath.   Gastrointestinal: Positive for abdominal distention and constipation. Negative for abdominal pain, diarrhea, nausea and vomiting.  Genitourinary: Negative for dysuria.  Musculoskeletal: Positive for gait problem. Negative for arthralgias and back pain.  Skin: Negative for color change.  Neurological: Negative for dizziness and weakness.  Hematological: Does not bruise/bleed easily.  Psychiatric/Behavioral: Positive for agitation and confusion. Negative for behavioral problems and sleep disturbance. The  patient is nervous/anxious.        Classically, she is extremely sweet and polite    Vitals:   02/09/19 1454  BP: (!) 105/59  Resp: 16  Temp: (!) 97.3 F (36.3 C)  TempSrc: Oral  SpO2: 95%  Weight: 126 lb 12.8 oz (57.5 kg)  Height: 5' (1.524 Lam)   Body mass index is 24.76 kg/Lam. Physical Exam Constitutional:      General: She is not in acute distress.    Appearance: Normal appearance. She is not toxic-appearing.  HENT:     Head: Normocephalic and atraumatic.     Right Ear: External ear normal.     Left Ear: External ear normal.     Nose: Nose normal.     Mouth/Throat:     Pharynx: Oropharynx is clear.  Eyes:     Extraocular Movements: Extraocular movements intact.     Conjunctiva/sclera: Conjunctivae normal.     Pupils: Pupils are equal, round, and reactive to light.     Comments: glasses  Cardiovascular:     Rate and Rhythm: Normal rate and regular rhythm.     Heart sounds: Murmur present.  Pulmonary:     Effort: Pulmonary effort is normal.     Breath sounds: Normal breath sounds.  Abdominal:     General: Bowel sounds are normal. There is distension.     Palpations: Abdomen is soft. There is no mass.     Tenderness: There is no abdominal tenderness. There is no guarding or rebound.     Comments: Chronic distended appearance to abdomen due to kyphosis/postural  Musculoskeletal: Normal range of motion.     Right lower leg: Edema present.     Left lower leg: Edema present.  Skin:    General: Skin is warm and dry.     Capillary Refill: Capillary refill takes less than 2 seconds.  Neurological:     General: No focal deficit present.     Mental Status: She is alert. Mental status is  at baseline.     Gait: Gait abnormal (shuffling gait with rolling walker).  Psychiatric:        Mood and Affect: Mood normal.     Labs reviewed: Basic Metabolic Panel: Recent Labs    02/17/18 0259 04/11/18 0700 02/01/19 0600  NA 141 144 145  K 4.5 4.3 3.9  BUN 22* 20 16   CREATININE 0.9 0.7 0.8   Liver Function Tests: Recent Labs    04/11/18 0700 02/01/19 0600  AST 21 20  ALT 5* 21  ALKPHOS 56 77   No results for input(s): LIPASE, AMYLASE in the last 8760 hours. No results for input(s): AMMONIA in the last 8760 hours. CBC: Recent Labs    04/11/18 0700 12/07/18 0500 02/01/19 0600  WBC 5.2 5.2 6.1  HGB 14.3 14.3 14.9  HCT 43 41 43  PLT 170 172 155   Cardiac Enzymes: No results for input(s): CKTOTAL, CKMB, CKMBINDEX, TROPONINI in the last 8760 hours. BNP: Invalid input(s): POCBNP No results found for: HGBA1C Lab Results  Component Value Date   TSH 3.06 02/01/2019   Lab Results  Component Value Date   [id number] 827 08/11/2014   No results found for: FOLATE No results found for: IRON, TIBC, FERRITIN  Imaging and Procedures obtained prior to SNF admission: Mr Brain Wo Contrast  Result Date: 12/22/2016 CLINICAL DATA:  Hallucinations. Left leg weakness. Gait disturbance. Symptoms worsening for 2-3 weeks. History of melanoma. EXAM: MRI HEAD WITHOUT CONTRAST TECHNIQUE: Multiplanar, multiecho pulse sequences of the brain and surrounding structures were obtained without intravenous contrast. COMPARISON:  Head CTs 12/07/2016 FINDINGS: Some sequences are mildly to moderately motion degraded. Brain: There is no evidence of acute infarct, mass, midline shift, or extra-axial fluid collection. There is moderate to advanced cerebral atrophy and moderate cerebellar atrophy. Patchy to confluent T2 hyperintensities throughout the cerebral white matter bilaterally are nonspecific but compatible with extensive chronic small vessel ischemic disease. There are innumerable chronic microhemorrhages located predominantly superficially throughout both cerebral hemispheres. One or two chronic cerebellar microhemorrhages are noted as well. Vascular: Major intracranial vascular flow voids are preserved. Skull and upper cervical spine: Unremarkable bone marrow signal.  Sinuses/Orbits: Unremarkable orbits. Minimal bilateral maxillary sinus mucosal thickening. Clear mastoid air cells. Other: None. IMPRESSION: 1. No acute intracranial abnormality. 2. Advanced chronic small vessel ischemic disease and cerebral atrophy. 3. Numerous chronic cerebral microhemorrhages suggestive of cerebral amyloid angiopathy. Electronically Signed   By: Logan Bores Lam.D.   On: 12/22/2016 11:06    Assessment/Plan 1. Dementia associated with Parkinson's disease ([place]) -has been progressive -has had some delusions in the past but none recently that I've been told about -I thought she'd do well in memory care with more activities and socialization  2. Parkinson's disease ([place]) -cont current mgt per neurology, walker use  3. Chronic venous insufficiency -cont compression hose--chronically asymmetric, but does need occasional lasix if overeats breakfast meats  4. Chronic constipation -controlled with her magnesium based laxative  5. Anxiety -Has ativan only for use if cannot be redirected (only used 2 times in the week.  Hopefully, she will adjust and participate in the activities in memory care which I thought would be best for her   6. Hypothyroidism due to acquired atrophy of thyroid -cont current levothyroxine dose, monitor 1-2 times per year  7. Aortic valve stenosis, etiology of cardiac valve disease unspecified -asymptomatic, monitor  8. History of melanoma -left arm -monitor for recurrence  9. Acute cystitis: -likely was colonized; Completed abx with  keflex for E coli UTI yesterday as this was felt to cause her agitation--suspect it was just due to adjusting to her new location and it was colonization  Pharmacist, hospital Communication: discussed with memory care nurse (second shift) and MAR reviewed  Labs/tests ordered:  No new--just had full labs last week (abstracted)  Lalitha Ilyas L. Phoenix Riesen, D.O. [facility] [address], [place] [address] Cell Phone (Mon-Fri 8am-5pm):  [phone] On Call:  [phone] & follow prompts after 5pm & weekends Office Phone:  [phone] Office Fax:  [phone]

## 2019-02-24 DIAGNOSIS — Z9189 Other specified personal risk factors, not elsewhere classified: Secondary | ICD-10-CM | POA: Diagnosis not present

## 2019-02-26 LAB — NOVEL CORONAVIRUS, NAA: SARS-CoV-2, NAA: NOT DETECTED

## 2019-03-01 ENCOUNTER — Encounter: Payer: Self-pay | Admitting: Internal Medicine

## 2019-03-02 ENCOUNTER — Encounter: Payer: Self-pay | Admitting: Internal Medicine

## 2019-03-02 ENCOUNTER — Non-Acute Institutional Stay (SKILLED_NURSING_FACILITY): Payer: Medicare Other | Admitting: Internal Medicine

## 2019-03-02 DIAGNOSIS — G20 Parkinson's disease: Secondary | ICD-10-CM | POA: Diagnosis not present

## 2019-03-02 DIAGNOSIS — G20A1 Parkinson's disease without dyskinesia, without mention of fluctuations: Secondary | ICD-10-CM

## 2019-03-02 DIAGNOSIS — F419 Anxiety disorder, unspecified: Secondary | ICD-10-CM

## 2019-03-02 DIAGNOSIS — F0280 Dementia in other diseases classified elsewhere without behavioral disturbance: Secondary | ICD-10-CM

## 2019-03-02 NOTE — Progress Notes (Signed)
Patient ID: Natalie Lam, female   DOB: 03/26/1932, 85 y.o.   MRN: [id number]  Location:  [facility] Room Number: 478-G, Memory Care Place of Service:  SNF (31) Provider:  Gayland Curry, DO  Patient Care Team: Gayland Curry, DO as PCP - General (Geriatric Medicine) Jola Schmidt, MD as Consulting Physician (Ophthalmology) Tat, Eustace Quail, DO as Consulting Physician (Neurology) Martinique, Peter M, MD as Consulting Physician (Cardiology)  Extended Emergency Contact Information Primary Emergency Contact: [name] Address: [address]          [place] Phone: [phone] Mobile Phone: [phone] Relation: Daughter  Code Status:  DNR  Goals of care: Advanced Directive information Advanced Directives 03/02/2019  Does Patient Have a Medical Advance Directive? Yes  Type of Paramedic of [place];Living will;Out of facility DNR (pink MOST or yellow form)  Does patient want to make changes to medical advance directive? No - Patient declined  Copy of [place] in Chart? Yes - validated most recent copy scanned in chart (See row information)  Would patient like information on creating a medical advance directive? -  Pre-existing out of facility DNR order (yellow form or pink MOST form) Yellow form placed in chart (order not valid for inpatient use)     Chief Complaint  Patient presents with  . Acute Visit    Afternoon anxiety    HPI:  Pt is a 85 y.o. female seen today for an acute visit for her afternoon "anxiety" as reported by her daughter, [name].  (See phone notes.).  [name] recently moved to memory care due to progressive dementia.  She has parkinson's disease..  She begins to get upset about being in memory care typically in the afternoon.  She packs her things and wants to move back to her home (not just AL but home).  She paces the halls with her  walker.  She sometimes refuses her evening meds if she's very upset, but later calms down.  She is typically redirectable by staff.  She got particularly upset recently and has been requesting to talk to her daughter.  She then tells her daughter that things are awful, but does not tell any of [place] here that.  She seems quite content and very sweet to staff.  She was perseverating about calling her daughter to let her know that she's ok.  She was reassured that her daughter had asked that I come check on her today.    Her only other concern today was getting her toenails and fingernails trimmed.  This was relayed to her nurse.  Past Medical History:  Diagnosis Date  . Aortic stenosis, mild   . Constipation, chronic   . Gilbert syndrome   . HTN (hypertension)   . Hyperlipidemia   . Hypothyroidism   . Melanoma ([place])   . Neuropathy   . Osteoporosis   . Parkinson's disease ([place])   . Peripheral neuropathy    Bilateral  . Rheumatic fever    does not take Flu shot    Past Surgical History:  Procedure Laterality Date  . ABDOMINAL HYSTERECTOMY  1963   no BSO; no abnormal PAPs  . COLONOSCOPY  2000   negative; [place]  . [place]  . MELANOMA EXCISION  08/2014    Allergies  Allergen Reactions  . Sulfonamide Derivatives     REACTION:generalized  swelling  . Gabapentin Hypertension and Swelling  .  Sulfa Antibiotics Swelling    Outpatient Encounter Medications as of 03/02/2019  Medication Sig  . acetaminophen (TYLENOL) 325 MG tablet Take 325 mg by mouth every 6 (six) hours as needed for mild pain.  Marland Kitchen acetaminophen (TYLENOL) 325 MG tablet Take 325 mg by mouth at bedtime.  Marland Kitchen amLODipine (NORVASC) 5 MG tablet Take 5 mg by mouth daily.  . Calcium Carbonate-Vitamin D (CALCIUM 600/VITAMIN D PO) Take 1 tablet by mouth daily.   . Carbidopa-Levodopa ER (SINEMET CR) 25-100 MG tablet controlled release 2 by mouth at 6 am, 1 by mouth at 11 am, and 1 by mouth at 4 pm  . Cholecalciferol  (VITAMIN D) 2000 units CAPS Take 1 capsule by mouth daily.  . furosemide (LASIX) 20 MG tablet Take 10 mg by mouth as needed.   Marland Kitchen levothyroxine (SYNTHROID, LEVOTHROID) 50 MCG tablet Take one by mouth on Tuesday and Thursday 75 mcg other days  . levothyroxine (SYNTHROID, LEVOTHROID) 75 MCG tablet Take 1 by mouth on Sunday, Monday, Wednesday, Friday, and Saturday  . LORazepam (ATIVAN) 0.5 MG tablet Take 0.25 mg by mouth 2 (two) times daily as needed for anxiety. X 14 days then reassess  . magnesium citrate SOLN Take 1 Bottle by mouth every 3 (three) days. And 296 mL daily as needed for constipation  . Magnesium Sulfate, Laxative, (EPSOM SALT) GRAN To soak feet daily at bedtime after taking ted hose off as needed  . Multiple Vitamin (MULTIVITAMIN) capsule Take 1 capsule by mouth daily.    Marland Kitchen omeprazole (PRILOSEC) 20 MG capsule Take 20 mg by mouth daily.   . potassium chloride (K-DUR) 10 MEQ tablet Take 10 mEq by mouth as needed. Use when taking Lasix  . traMADol (ULTRAM) 50 MG tablet Take 0.5 tablets (25 mg total) by mouth every 6 (six) hours as needed.  . [DISCONTINUED] LORazepam (ATIVAN) 0.5 MG tablet Take 0.5 tablets (0.25 mg total) by mouth 2 (two) times daily as needed for anxiety. (Patient taking differently: Take 0.25 mg by mouth 2 (two) times daily as needed for anxiety. X 14 days(ending 03/10/2019) then reassess)   No facility-administered encounter medications on file as of 03/02/2019.     Review of Systems  Constitutional: Negative for activity change, appetite change, chills and fever.  HENT: Negative for congestion, facial swelling and trouble swallowing.   Respiratory: Negative for chest tightness and shortness of breath.   Cardiovascular: Positive for leg swelling. Negative for chest pain and palpitations.       No changes in edema, wearing compression hose  Gastrointestinal: Negative for abdominal pain, blood in stool, constipation, diarrhea, nausea and vomiting.  Endocrine:  Negative for polyuria.  Genitourinary: Negative for dysuria.  Musculoskeletal: Positive for gait problem. Negative for arthralgias and back pain.  Skin: Negative for color change.  Neurological: Negative for dizziness and weakness.  Psychiatric/Behavioral: Positive for agitation and confusion. Negative for behavioral problems, decreased concentration, dysphoric mood, hallucinations and sleep disturbance.       Has been packing up her things to move home in the afternoons, evenings, at times    Immunization History  Administered Date(s) Administered  . PPD Test 04/29/2016  . Tdap 08/11/2014   Pertinent  Health Maintenance Due  Topic Date Due  . DEXA SCAN  Completed  . INFLUENZA VACCINE  Discontinued  . PNA vac Low Risk Adult  Discontinued   Fall Risk  01/13/2019 09/16/2018 05/13/2018 05/11/2018 03/18/2018  Falls in the past year? 0 0 1 0 Yes  Number falls  in past yr: 0 0 0 0 1  Comment - - - - -  Injury with Fall? 0 0 0 0 No  Comment - - - - -  Risk Factor Category  - - - - -  Follow up - - - Falls evaluation completed -   Functional Status Survey:    Vitals:   03/02/19 1314  BP: 138/83  Pulse: 79  Resp: 19  Temp: (!) 97.4 F (36.3 C)  SpO2: 95%  Weight: 121 lb 12.8 oz (55.2 kg)  Height: 5' 4"  (1.626 m)   Body mass index is 20.91 kg/m. Physical Exam Vitals signs reviewed.  Constitutional:      General: She is not in acute distress.    Appearance: Normal appearance. She is not ill-appearing or toxic-appearing.  HENT:     Head: Normocephalic and atraumatic.  Eyes:     Conjunctiva/sclera: Conjunctivae normal.     Pupils: Pupils are equal, round, and reactive to light.  Cardiovascular:     Rate and Rhythm: Normal rate and regular rhythm.     Pulses: Normal pulses.     Heart sounds: Normal heart sounds.  Pulmonary:     Effort: Pulmonary effort is normal. No respiratory distress.     Breath sounds: Normal breath sounds. No wheezing or rales.  Abdominal:      General: Bowel sounds are normal.     Comments: Chronic abdominal prominence which is postural  Neurological:     General: No focal deficit present.     Mental Status: She is alert.     Cranial Nerves: No cranial nerve deficit.     Motor: No weakness.     Gait: Gait abnormal.     Comments: Shuffling gait with walker, slowly walking around unit looking for whichever staff member had her call her daughter before when we first met; was redirected and remained quite calm--was not tearful, agitated or anxious during my visit in the late afternoon  Psychiatric:        Mood and Affect: Mood normal.     Comments: Was pleasant; did think she could go home, but does now realize she's staying where she is     Labs reviewed: Recent Labs    04/11/18 0700 02/01/19  NA 144 145  K 4.3 3.9  BUN 20 16  CREATININE 0.7 0.8   Recent Labs    04/11/18 0700 02/01/19 0600  AST 21 20  ALT 5* 21  ALKPHOS 56 77   Recent Labs    04/11/18 0700 12/07/18 02/01/19  WBC 5.2 5.2 6.1  HGB 14.3 14.3 14.9  HCT 43 41 43  PLT 170 172 155   Lab Results  Component Value Date   TSH 3.06 02/01/2019   No results found for: HGBA1C Lab Results  Component Value Date   CHOL 199 08/12/2014   HDL 87 08/12/2014   LDLCALC 104 (H) 08/12/2014   LDLDIRECT 105.9 06/15/2013   TRIG 38 08/12/2014   CHOLHDL 2.3 08/12/2014    Assessment/Plan 1. Dementia associated with Parkinson's disease ([place]) -behaviors seem classic for dementia with some delusional thoughts -she initially got treated for a UTI upon moving due to the agitation (keflex) -given her Parkinson's, unless she's having psychosis that is very distressing to her (hallucinations that are scary) or she's a danger to her self or others, seroquel is not indicated -at this point, she is calm and typically redirectable  2. Anxiety -reviewed use of ativan -majority of time, Mrs. [employer]  can be redirected w/o use of medications which can be sedating and can lead  to falls -she received lorazepam 6 times since it was prescribed about a month ago when she was agitated and upset upon moving to memory care--is ordered bid prn, but not to be used if resident can be redirected by nonpharmacologic means for her safety -resident gets anxious and upset when talking with her daughter and that upsets her daughter so she's requested that staff not have her call her at this point which does seem reasonable  3. Parkinson's disease ([place]) -cont current sinemet, walker use  Family/ staff Communication: her daughter is using mychart and can review the notes  Labs/tests ordered:  No new needed  Macguire Holsinger L. Sabrina Keough, D.O. [facility] [address], [place] [address] Cell Phone (Mon-Fri 8am-5pm):  [phone] On Call:  [phone] & follow prompts after 5pm & weekends Office Phone:  [phone] Office Fax:  [phone]

## 2019-03-04 DIAGNOSIS — Z20828 Contact with and (suspected) exposure to other viral communicable diseases: Secondary | ICD-10-CM | POA: Diagnosis not present

## 2019-03-10 ENCOUNTER — Encounter: Payer: Self-pay | Admitting: *Deleted

## 2019-03-10 ENCOUNTER — Encounter: Payer: Self-pay | Admitting: Internal Medicine

## 2019-03-10 DIAGNOSIS — Z9189 Other specified personal risk factors, not elsewhere classified: Secondary | ICD-10-CM | POA: Diagnosis not present

## 2019-03-10 NOTE — Telephone Encounter (Signed)
Routed to [name], [name], DO  

## 2019-03-15 DIAGNOSIS — Z9189 Other specified personal risk factors, not elsewhere classified: Secondary | ICD-10-CM | POA: Diagnosis not present

## 2019-03-19 DIAGNOSIS — Z20828 Contact with and (suspected) exposure to other viral communicable diseases: Secondary | ICD-10-CM | POA: Diagnosis not present

## 2019-03-25 DIAGNOSIS — Z9189 Other specified personal risk factors, not elsewhere classified: Secondary | ICD-10-CM | POA: Diagnosis not present

## 2019-03-29 DIAGNOSIS — Z9189 Other specified personal risk factors, not elsewhere classified: Secondary | ICD-10-CM | POA: Diagnosis not present

## 2019-04-07 DIAGNOSIS — Z9189 Other specified personal risk factors, not elsewhere classified: Secondary | ICD-10-CM | POA: Diagnosis not present

## 2019-04-08 ENCOUNTER — Non-Acute Institutional Stay (SKILLED_NURSING_FACILITY): Payer: Medicare Other | Admitting: Adult Health

## 2019-04-08 DIAGNOSIS — I872 Venous insufficiency (chronic) (peripheral): Secondary | ICD-10-CM

## 2019-04-08 DIAGNOSIS — I10 Essential (primary) hypertension: Secondary | ICD-10-CM | POA: Diagnosis not present

## 2019-04-08 DIAGNOSIS — K5909 Other constipation: Secondary | ICD-10-CM

## 2019-04-08 DIAGNOSIS — G20 Parkinson's disease: Secondary | ICD-10-CM

## 2019-04-08 DIAGNOSIS — F0280 Dementia in other diseases classified elsewhere without behavioral disturbance: Secondary | ICD-10-CM

## 2019-04-09 ENCOUNTER — Encounter: Payer: Self-pay | Admitting: Adult Health

## 2019-04-09 NOTE — Progress Notes (Signed)
Location:  Occupational psychologist of Service:  SNF (31) Provider:   Cindi Carbon, ANP [place] [phone]   Gayland Curry, DO  Patient Care Team: Gayland Curry, DO as PCP - General (Geriatric Medicine) Jola Schmidt, MD as Consulting Physician (Ophthalmology) Tat, Eustace Quail, DO as Consulting Physician (Neurology) Martinique, Peter M, MD as Consulting Physician (Cardiology)  Extended Emergency Contact Information Primary Emergency Contact: [name] Address: [address]          [name] Phone: [phone] Mobile Phone: [phone] Relation: Daughter  Code Status:  DNR Goals of care: Advanced Directive information Advanced Directives 03/02/2019  Does Patient Have a Medical Advance Directive? Yes  Type of Paramedic of [place];Living will;Out of facility DNR (pink MOST or yellow form)  Does patient want to make changes to medical advance directive? No - Patient declined  Copy of [place] in Chart? Yes - validated most recent copy scanned in chart (See row information)  Would patient like information on creating a medical advance directive? -  Pre-existing out of facility DNR order (yellow form or pink MOST form) Yellow form placed in chart (order not valid for inpatient use)     Chief Complaint  Patient presents with  . Medical Management of Chronic Issues    HPI:  Pt is a 85 y.o. female seen today for medical management of chronic diseases.    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 Stankus moved to the memory care unit in Sept of 2020. She has experienced some anxiety in the late evening and paranoid delusions. Over time this has improved. She has only needed ativan 1 time for this issue in the past two weeks. She is typically easily redirectable or distracted but if not the staff uses ativan to help relieve anxiety.  MRI of the brain in 2018 showed:  IMPRESSION: 1. No acute intracranial abnormality. 2. Advanced chronic small vessel ischemic disease and cerebral atrophy. 3. Numerous chronic cerebral microhemorrhages suggestive of cerebral amyloid angiopathy.  PD: continues to slowly ambulate with a walker, festinating gait, minimal rigidity or tremor today  Venous insuff: uses compression hose, has not needed lasix Weight has trended down by 10 lbs in the past 3 months due to decreased intake.  Wt Readings from Last 3 Encounters:  03/02/19 121 lb 12.8 oz (55.2 kg)  02/09/19 126 lb 12.8 oz (57.5 kg)  01/13/19 129 lb (58.5 kg)   Reports that she had a bm on 11/11.  Taking miralax which helps instead of  Mag citrate. Reports occasional bloating but no pain.      Past Medical History:  Diagnosis Date  . Aortic stenosis, mild   . Constipation, chronic   . Gilbert syndrome   . HTN (hypertension)   . Hyperlipidemia   . Hypothyroidism   . Melanoma ([place])   . Neuropathy   . Osteoporosis   . Parkinson's disease ([place])   . Peripheral neuropathy    Bilateral  . Rheumatic fever    does not take Flu shot    Past Surgical History:  Procedure Laterality Date  . ABDOMINAL HYSTERECTOMY  1963   no BSO; no abnormal PAPs  . COLONOSCOPY  2000   negative; [place]  . [place]  . MELANOMA EXCISION  08/2014    Allergies  Allergen Reactions  . Sulfonamide Derivatives     REACTION:generalized  swelling  . Gabapentin Hypertension and  Swelling  . Sulfa Antibiotics Swelling    Outpatient Encounter Medications as of 04/08/2019  Medication Sig  . Carbidopa-Levodopa ER (SINEMET CR) 25-100 MG tablet controlled release 2 by mouth at 6 am, 1 by mouth at 11 am, and 1 by mouth at 4 pm  . levothyroxine (SYNTHROID, LEVOTHROID) 50 MCG tablet Take one by mouth on Tuesday and Thursday 75 mcg other days  . levothyroxine (SYNTHROID, LEVOTHROID) 75 MCG tablet Take 1 by mouth on Sunday, Monday, Wednesday, Friday, and Saturday  .  LORazepam (ATIVAN) 0.5 MG tablet Take 0.25 mg by mouth 2 (two) times daily as needed for anxiety.  . Magnesium Sulfate, Laxative, (EPSOM SALT) GRAN To soak feet daily at bedtime after taking ted hose off as needed  . omeprazole (PRILOSEC) 20 MG capsule Take 20 mg by mouth daily.   . polyethylene glycol (MIRALAX / GLYCOLAX) 17 g packet Take 17 g by mouth every 3 (three) days.  . potassium chloride (K-DUR) 10 MEQ tablet Take 10 mEq by mouth as needed. Use when taking Lasix  . traMADol (ULTRAM) 50 MG tablet Take 0.5 tablets (25 mg total) by mouth every 6 (six) hours as needed.  Marland Kitchen acetaminophen (TYLENOL) 325 MG tablet Take 325 mg by mouth every 6 (six) hours as needed for mild pain.  Marland Kitchen acetaminophen (TYLENOL) 325 MG tablet Take 325 mg by mouth at bedtime.  Marland Kitchen amLODipine (NORVASC) 5 MG tablet Take 5 mg by mouth daily.  . Calcium Carbonate-Vitamin D (CALCIUM 600/VITAMIN D PO) Take 1 tablet by mouth daily.   . Cholecalciferol (VITAMIN D) 2000 units CAPS Take 1 capsule by mouth daily.  . furosemide (LASIX) 20 MG tablet Take 10 mg by mouth as needed.   . Multiple Vitamin (MULTIVITAMIN) capsule Take 1 capsule by mouth daily.    . [DISCONTINUED] magnesium citrate SOLN Take 1 Bottle by mouth every 3 (three) days. And 296 mL daily as needed for constipation   No facility-administered encounter medications on file as of 04/08/2019.     Review of Systems  Constitutional: Positive for unexpected weight change. Negative for activity change, appetite change, chills, diaphoresis, fatigue and fever.  HENT: Negative for congestion.   Respiratory: Negative for cough, shortness of breath and wheezing.   Cardiovascular: Positive for leg swelling. Negative for chest pain and palpitations.  Gastrointestinal: Positive for abdominal distention (chronic). Negative for abdominal pain, constipation and diarrhea.  Genitourinary: Negative for difficulty urinating and dysuria.  Musculoskeletal: Positive for gait problem.  Negative for arthralgias, back pain, joint swelling and myalgias.  Neurological: Negative for dizziness, tremors, seizures, syncope, facial asymmetry, speech difficulty, weakness, light-headedness, numbness and headaches.  Psychiatric/Behavioral: Positive for confusion. Negative for agitation, behavioral problems, decreased concentration, dysphoric mood, self-injury, sleep disturbance and suicidal ideas. The patient is nervous/anxious (evenings. ).        Delusions     Immunization History  Administered Date(s) Administered  . PPD Test 04/29/2016  . Tdap 08/11/2014   Pertinent  Health Maintenance Due  Topic Date Due  . DEXA SCAN  Completed  . INFLUENZA VACCINE  Discontinued  . PNA vac Low Risk Adult  Discontinued   Fall Risk  01/13/2019 09/16/2018 05/13/2018 05/11/2018 03/18/2018  Falls in the past year? 0 0 1 0 Yes  Number falls in past yr: 0 0 0 0 1  Comment - - - - -  Injury with Fall? 0 0 0 0 No  Comment - - - - -  Risk Factor Category  - - - - -  Follow up - - - Falls evaluation completed -   Functional Status Survey:    There were no vitals filed for this visit. There is no height or weight on file to calculate BMI. Physical Exam Vitals signs and nursing note reviewed.  Constitutional:      General: She is not in acute distress.    Appearance: She is not diaphoretic.  HENT:     Head: Normocephalic and atraumatic.  Neck:     Vascular: No JVD.  Cardiovascular:     Rate and Rhythm: Normal rate and regular rhythm.     Heart sounds: Murmur present.  Pulmonary:     Effort: Pulmonary effort is normal. No respiratory distress.     Breath sounds: Normal breath sounds. No wheezing.  Abdominal:     General: Bowel sounds are normal. There is distension (chronic).     Palpations: Abdomen is soft.     Tenderness: There is no abdominal tenderness. There is no guarding.  Musculoskeletal:     Right lower leg: Edema (+1) present.     Left lower leg: Edema (+1) present.  Skin:     General: Skin is warm and dry.  Neurological:     General: No focal deficit present.     Mental Status: She is alert. Mental status is at baseline.     Comments: Minimal rigidity noted to BUE and BLE. Slow gait.   Psychiatric:        Mood and Affect: Mood normal.     Labs reviewed: Recent Labs    04/11/18 0700 02/01/19  NA 144 145  K 4.3 3.9  BUN 20 16  CREATININE 0.7 0.8   Recent Labs    04/11/18 0700 02/01/19 0600  AST 21 20  ALT 5* 21  ALKPHOS 56 77   Recent Labs    04/11/18 0700 12/07/18 02/01/19  WBC 5.2 5.2 6.1  HGB 14.3 14.3 14.9  HCT 43 41 43  PLT 170 172 155   Lab Results  Component Value Date   TSH 3.06 02/01/2019   No results found for: HGBA1C Lab Results  Component Value Date   CHOL 199 08/12/2014   HDL 87 08/12/2014   LDLCALC 104 (H) 08/12/2014   LDLDIRECT 105.9 06/15/2013   TRIG 38 08/12/2014   CHOLHDL 2.3 08/12/2014    Significant Diagnostic Results in last 30 days:  No results found.  Assessment/Plan 1. Parkinson's disease ([place]) No new issues, continues with slow shuffling gait and high fall risk Keep walker with resident at all times Continue sinemet as ordered.   2. Chronic venous insufficiency Continue compression hose on in the am and off in the pm Continue prn lasix 10 mg   3. Essential hypertension BP in the 130's, controlled  Continue Norvasc 5 mg qd   4. Chronic constipation Continue miralax 17 grams q 3 days and prn mag citrate.   5. 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 Her delusions can be upsetting but at this time she is redirectable and seems to be adjusting. No changes made.    Family/ staff Communication: resident and her nurse Andee Poles  Labs/tests ordered:  NA

## 2019-04-13 DIAGNOSIS — Z9189 Other specified personal risk factors, not elsewhere classified: Secondary | ICD-10-CM | POA: Diagnosis not present

## 2019-04-20 DIAGNOSIS — Z9189 Other specified personal risk factors, not elsewhere classified: Secondary | ICD-10-CM | POA: Diagnosis not present

## 2019-04-28 ENCOUNTER — Other Ambulatory Visit: Payer: Self-pay

## 2019-04-28 MED ORDER — LORAZEPAM 0.5 MG PO TABS: 0.2500 mg | ORAL_TABLET | Freq: Two times a day (BID) | ORAL | 5 refills | Status: DC | PRN

## 2019-05-11 LAB — BASIC METABOLIC PANEL
BUN: 16 (ref 4–21)
CO2: 27 — AB (ref 13–22)
Chloride: 103 (ref 99–108)
Creatinine: 0.7 (ref 0.5–1.1)
Glucose: 121
Potassium: 3.8 (ref 3.4–5.3)
Sodium: 143 (ref 137–147)

## 2019-05-11 LAB — COMPREHENSIVE METABOLIC PANEL
Albumin: 4.2 (ref 3.5–5.0)
Calcium: 9.5 (ref 8.7–10.7)
Globulin: 2.3

## 2019-05-11 LAB — CBC AND DIFFERENTIAL
HCT: 42 (ref 36–46)
Hemoglobin: 14.4 (ref 12.0–16.0)
Platelets: 162 (ref 150–399)
WBC: 4.9

## 2019-05-11 LAB — HEPATIC FUNCTION PANEL
ALT: 6 — AB (ref 7–35)
AST: 20 (ref 13–35)
Alkaline Phosphatase: 55 (ref 25–125)
Bilirubin, Total: 1.1

## 2019-05-11 LAB — TSH: TSH: 1.76 (ref 0.41–5.90)

## 2019-05-11 LAB — CBC: RBC: 4.44 (ref 3.87–5.11)

## 2019-05-12 ENCOUNTER — Encounter: Payer: Self-pay | Admitting: Internal Medicine

## 2019-05-14 ENCOUNTER — Encounter: Payer: Self-pay | Admitting: Adult Health

## 2019-05-14 ENCOUNTER — Non-Acute Institutional Stay (SKILLED_NURSING_FACILITY): Payer: Medicare Other | Admitting: Adult Health

## 2019-05-14 DIAGNOSIS — K5901 Slow transit constipation: Secondary | ICD-10-CM

## 2019-05-14 DIAGNOSIS — G20 Parkinson's disease: Secondary | ICD-10-CM | POA: Diagnosis not present

## 2019-05-14 DIAGNOSIS — I10 Essential (primary) hypertension: Secondary | ICD-10-CM

## 2019-05-14 DIAGNOSIS — R2681 Unsteadiness on feet: Secondary | ICD-10-CM | POA: Diagnosis not present

## 2019-05-14 DIAGNOSIS — F0280 Dementia in other diseases classified elsewhere without behavioral disturbance: Secondary | ICD-10-CM

## 2019-05-14 DIAGNOSIS — I872 Venous insufficiency (chronic) (peripheral): Secondary | ICD-10-CM

## 2019-05-14 NOTE — Progress Notes (Signed)
Location:  Occupational psychologist of Service:  SNF (31) Provider:   Cindi Carbon, ANP [place] [phone]   Gayland Curry, DO  Patient Care Team: Gayland Curry, DO as PCP - General (Geriatric Medicine) Jola Schmidt, MD as Consulting Physician (Ophthalmology) Tat, Eustace Quail, DO as Consulting Physician (Neurology) [place], Peter M, MD as Consulting Physician (Cardiology)  Extended Emergency Contact Information Primary Emergency Contact: [name] Address: [address]          [name] Phone: [phone] Mobile Phone: [phone] Relation: Daughter  Code Status:  DNR Goals of care: Advanced Directive information Advanced Directives 03/02/2019  Does Patient Have a Medical Advance Directive? Yes  Type of Paramedic of [place];Living will;Out of facility DNR (pink MOST or yellow form)  Does patient want to make changes to medical advance directive? No - Patient declined  Copy of [place] in Chart? Yes - validated most recent copy scanned in chart (See row information)  Would patient like information on creating a medical advance directive? -  Pre-existing out of facility DNR order (yellow form or pink MOST form) Yellow form placed in chart (order not valid for inpatient use)     Chief Complaint  Patient presents with  . Medical Management of Chronic Issues    HPI:  Pt is a 85 y.o. female seen today for medical management of chronic diseases.   She resides in the memory care setting due to progressive dementia with Parkinson's disease. The nurse reports in the afternoon hours becomes anxious and is not easily redirected. She has delusions that she is a little girl and reports vivid dreams. No hallucinations are reported.   She has had some issues with constipation recently and takes miralax q 3 days. No abd pain, nausea, or vomiting.   She carries  chronic edema in her legs and her weight is monitored regularly.Currently she has lost 7 lbs since Sept. Wt Readings from Last 3 Encounters:  05/14/19 116 lb (52.6 kg)  04/09/19 119 lb (54 kg)  03/02/19 121 lb 12.8 oz (55.2 kg)    BP has ranged [id number] systolic with occasional reading in the 160's  She fell on 12/10 due to walking alone with an unsteady gait and without her walker. No significant injury noted, just some skin tears.    Past Medical History:  Diagnosis Date  . Aortic stenosis, mild   . Constipation, chronic   . Gilbert syndrome   . HTN (hypertension)   . Hyperlipidemia   . Hypothyroidism   . Melanoma ([place])   . Neuropathy   . Osteoporosis   . Parkinson's disease ([place])   . Peripheral neuropathy    Bilateral  . Rheumatic fever    does not take Flu shot    Past Surgical History:  Procedure Laterality Date  . ABDOMINAL HYSTERECTOMY  1963   no BSO; no abnormal PAPs  . COLONOSCOPY  2000   negative; [place]  . [place]  . MELANOMA EXCISION  08/2014    Allergies  Allergen Reactions  . Sulfonamide Derivatives     REACTION:generalized  swelling  . Gabapentin Hypertension and Swelling  . Sulfa Antibiotics Swelling    Outpatient Encounter Medications as of 05/14/2019  Medication Sig  . acetaminophen (TYLENOL) 325 MG tablet Take 325 mg by mouth every 6 (six) hours as needed for mild pain.  Marland Kitchen acetaminophen (TYLENOL) 325 MG tablet  Take 325 mg by mouth at bedtime.  Marland Kitchen amLODipine (NORVASC) 5 MG tablet Take 5 mg by mouth daily.  . Calcium Carbonate-Vitamin D (CALCIUM 600/VITAMIN D PO) Take 1 tablet by mouth daily.   . Carbidopa-Levodopa ER (SINEMET CR) 25-100 MG tablet controlled release 2 by mouth at 6 am, 1 by mouth at 11 am, and 1 by mouth at 4 pm  . Cholecalciferol (VITAMIN D) 2000 units CAPS Take 1 capsule by mouth daily.  . furosemide (LASIX) 20 MG tablet Take 10 mg by mouth as needed.   Marland Kitchen levothyroxine (SYNTHROID, LEVOTHROID) 50 MCG tablet Take  one by mouth on Tuesday and Thursday 75 mcg other days  . levothyroxine (SYNTHROID, LEVOTHROID) 75 MCG tablet Take 1 by mouth on Sunday, Monday, Wednesday, Friday, and Saturday  . LORazepam (ATIVAN) 0.5 MG tablet Take 0.5 tablets (0.25 mg total) by mouth 2 (two) times daily as needed for anxiety.  . Magnesium Sulfate, Laxative, (EPSOM SALT) GRAN To soak feet daily at bedtime after taking ted hose off as needed  . Multiple Vitamin (MULTIVITAMIN) capsule Take 1 capsule by mouth daily.    Marland Kitchen omeprazole (PRILOSEC) 20 MG capsule Take 20 mg by mouth daily.   . polyethylene glycol (MIRALAX / GLYCOLAX) 17 g packet Take 17 g by mouth every 3 (three) days.  . potassium chloride (K-DUR) 10 MEQ tablet Take 10 mEq by mouth as needed. Use when taking Lasix  . [DISCONTINUED] traMADol (ULTRAM) 50 MG tablet Take 0.5 tablets (25 mg total) by mouth every 6 (six) hours as needed.   No facility-administered encounter medications on file as of 05/14/2019.    Review of Systems  Constitutional: Negative for activity change, appetite change, chills, diaphoresis, fatigue, fever and unexpected weight change.  HENT: Negative for congestion.   Respiratory: Negative for cough, shortness of breath and wheezing.   Cardiovascular: Positive for leg swelling. Negative for chest pain and palpitations.  Gastrointestinal: Negative for abdominal distention, abdominal pain, constipation and diarrhea.  Genitourinary: Negative for difficulty urinating and dysuria.  Musculoskeletal: Positive for gait problem. Negative for arthralgias, back pain, joint swelling and myalgias.  Neurological: Negative for dizziness, tremors, seizures, syncope, facial asymmetry, speech difficulty, weakness, light-headedness, numbness and headaches.       Shuffling gait  Psychiatric/Behavioral: Positive for confusion. Negative for agitation, behavioral problems, decreased concentration, dysphoric mood, hallucinations, self-injury, sleep disturbance and  suicidal ideas. The patient is nervous/anxious. The patient is not hyperactive.        Delusions    Immunization History  Administered Date(s) Administered  . PPD Test 04/29/2016  . Tdap 08/11/2014   Pertinent  Health Maintenance Due  Topic Date Due  . DEXA SCAN  Completed  . INFLUENZA VACCINE  Discontinued  . PNA vac Low Risk Adult  Discontinued   Fall Risk  01/13/2019 09/16/2018 05/13/2018 05/11/2018 03/18/2018  Falls in the past year? 0 0 1 0 Yes  Number falls in past yr: 0 0 0 0 1  Comment - - - - -  Injury with Fall? 0 0 0 0 No  Comment - - - - -  Risk Factor Category  - - - - -  Follow up - - - Falls evaluation completed -   Functional Status Survey:    Vitals:   05/14/19 1344  Weight: 116 lb (52.6 kg)   Body mass index is 19.91 kg/m. Physical Exam Vitals and nursing note reviewed.  Constitutional:      General: She is not in acute distress.  Appearance: She is not diaphoretic.  HENT:     Head: Normocephalic and atraumatic.     Nose: Nose normal. No congestion.     Mouth/Throat:     Mouth: Mucous membranes are moist.     Pharynx: Oropharynx is clear.  Neck:     Vascular: No JVD.  Cardiovascular:     Rate and Rhythm: Normal rate and regular rhythm.     Heart sounds: No murmur.  Pulmonary:     Effort: Pulmonary effort is normal. No respiratory distress.     Breath sounds: Normal breath sounds. No wheezing.  Abdominal:     General: Bowel sounds are normal. There is distension (chronic and unchanged).     Palpations: Abdomen is soft. There is no mass.     Tenderness: There is no abdominal tenderness. There is no guarding.  Musculoskeletal:     Right lower leg: Edema present.     Left lower leg: Edema present.  Skin:    General: Skin is warm and dry.  Neurological:     General: No focal deficit present.     Mental Status: She is alert. Mental status is at baseline.  Psychiatric:        Mood and Affect: Mood normal.     Labs reviewed: Recent Labs      02/01/19 0000  NA 145  K 3.9  BUN 16  CREATININE 0.8   Recent Labs    02/01/19 0600  AST 20  ALT 21  ALKPHOS 77   Recent Labs    12/07/18 0000 02/01/19 0000  WBC 5.2 6.1  HGB 14.3 14.9  HCT 41 43  PLT 172 155   Lab Results  Component Value Date   TSH 3.06 02/01/2019   No results found for: HGBA1C Lab Results  Component Value Date   CHOL 199 08/12/2014   HDL 87 08/12/2014   LDLCALC 104 (H) 08/12/2014   LDLDIRECT 105.9 06/15/2013   TRIG 38 08/12/2014   CHOLHDL 2.3 08/12/2014    Significant Diagnostic Results in last 30 days:  No results found.  Assessment/Plan 1. Slow transit constipation Worsening Increase miralax to 17 grams qod  2. Dementia associated with Parkinson's disease ([place]) Worsening delusions and confusion in the memory care unit. Not easily redirected. Would continue as needed ativan. Consider additional measure if this continues to worsen.  3. Parkinson's disease ([place]) Continues on Sinemet but refuses it at times per the nurse.  She has not seen Dr. Carles Collet in quite some time but there is no additional intervention that would be helpful at this time. She remains with a shuffling gait no worse than the last visit.   4. Unsteady gait Due to #3 Fall risk, needs to use walker at all times with ambulation and be under direct supervision at all times.   5. Essential hypertension Slight elevation at times but given her fall risk would not treat aggressively. Goal <150/90  6. Chronic venous insufficiency Continue compression hose on in the am and off in the pm  Family/ staff Communication: discussed with the nruse  Labs/tests ordered:  NA

## 2019-06-10 ENCOUNTER — Non-Acute Institutional Stay (SKILLED_NURSING_FACILITY): Payer: Medicare Other | Admitting: Adult Health

## 2019-06-10 ENCOUNTER — Encounter: Payer: Self-pay | Admitting: Adult Health

## 2019-06-10 DIAGNOSIS — F0280 Dementia in other diseases classified elsewhere without behavioral disturbance: Secondary | ICD-10-CM

## 2019-06-10 DIAGNOSIS — I350 Nonrheumatic aortic (valve) stenosis: Secondary | ICD-10-CM

## 2019-06-10 DIAGNOSIS — Z66 Do not resuscitate: Secondary | ICD-10-CM | POA: Diagnosis not present

## 2019-06-10 DIAGNOSIS — G20 Parkinson's disease: Secondary | ICD-10-CM | POA: Diagnosis not present

## 2019-06-10 DIAGNOSIS — I872 Venous insufficiency (chronic) (peripheral): Secondary | ICD-10-CM

## 2019-06-10 DIAGNOSIS — E039 Hypothyroidism, unspecified: Secondary | ICD-10-CM | POA: Diagnosis not present

## 2019-06-10 DIAGNOSIS — K5909 Other constipation: Secondary | ICD-10-CM

## 2019-06-10 NOTE — Progress Notes (Signed)
Location:  [facility]up[facility] psychologist of Service:  SNF (31) Provider:   Cindi Carbon, ANP [place] [phone]  Gayland Curry, DO  Patient Care Team: Gayland Curry, DO as PCP - General (Geriatric Medicine) Jola Schmidt, MD as Consulting Physician (Ophthalmology) Tat, Eustace Quail, DO as Consulting Physician (Neurology) Martinique, Peter M, MD as Consulting Physician (Cardiology)  Extended Emergency Contact Information Primary Emergency Contact: [name] Address: [address]          [name] Phone: [phone] Mobile Phone: [phone] Relation: Daughter  Code Status:  DNR Goals of care: Advanced Directive information Advanced Directives 03/02/2019  Does Patient Have a Medical Advance Directive? Yes  Type of Paramedic of [place];Living will;Out of facility DNR (pink MOST or yellow form)  Does patient want to make changes to medical advance directive? No - Patient declined  Copy of [place] in Chart? Yes - validated most recent copy scanned in chart (See row information)  Would patient like information on creating a medical advance directive? -  Pre-existing out of facility DNR order (yellow form or pink MOST form) Yellow form placed in chart (order not valid for inpatient use)     Chief Complaint  Patient presents with  . Medical Management of Chronic Issues    HPI:  Pt is a 86 y.o. female seen today for medical management of chronic diseases.   Ms. Leishman has a hx of dementia related to PD and resides in the memory care unit. The nurse reports that she can become anxious in the afternoon. Behaviors include paranoia and packing things, wanting to go home.  She has chronic edema that is unchanged and wears compression hose. No issues with cp, sob, or doe. Noted hx of aortic stenosis with no current symptoms.   She has chronic abd swelling and  constipation. Currently takes miralax qod and needs prn meds at times.   There are no new complaints. She appears well and in no acute distress for my visit.    Past Medical History:  Diagnosis Date  . Aortic stenosis, mild   . Constipation, chronic   . Gilbert syndrome   . HTN (hypertension)   . Hyperlipidemia   . Hypothyroidism   . Melanoma ([place])   . Neuropathy   . Osteoporosis   . Parkinson's disease ([place])   . Peripheral neuropathy    Bilateral  . Rheumatic fever    does not take Flu shot    Past Surgical History:  Procedure Laterality Date  . ABDOMINAL HYSTERECTOMY  1963   no BSO; no abnormal PAPs  . COLONOSCOPY  2000   negative; [place]  . [place]  . MELANOMA EXCISION  08/2014    Allergies  Allergen Reactions  . Sulfonamide Derivatives     REACTION:generalized  swelling  . Gabapentin Hypertension and Swelling  . Sulfa Antibiotics Swelling    Outpatient Encounter Medications as of 06/10/2019  Medication Sig  . LORazepam (ATIVAN) 0.5 MG tablet Take 0.5 tablets (0.25 mg total) by mouth 2 (two) times daily as needed for anxiety.  Marland Kitchen acetaminophen (TYLENOL) 325 MG tablet Take 325 mg by mouth every 6 (six) hours as needed for mild pain.  Marland Kitchen acetaminophen (TYLENOL) 325 MG tablet Take 325 mg by mouth at bedtime.  Marland Kitchen amLODipine (NORVASC) 5 MG tablet Take 5 mg by mouth daily.  . Calcium Carbonate-Vitamin D (CALCIUM 600/VITAMIN D PO)  Take 1 tablet by mouth daily.   . Carbidopa-Levodopa ER (SINEMET CR) 25-100 MG tablet controlled release 2 by mouth at 6 am, 1 by mouth at 11 am, and 1 by mouth at 4 pm  . Cholecalciferol (VITAMIN D) 2000 units CAPS Take 1 capsule by mouth daily.  . furosemide (LASIX) 20 MG tablet Take 10 mg by mouth as needed.   Marland Kitchen levothyroxine (SYNTHROID, LEVOTHROID) 50 MCG tablet Take one by mouth on Tuesday and Thursday 75 mcg other days  . levothyroxine (SYNTHROID, LEVOTHROID) 75 MCG tablet Take 1 by mouth on Sunday, Monday, Wednesday,  Friday, and Saturday  . Magnesium Sulfate, Laxative, (EPSOM SALT) GRAN To soak feet daily at bedtime after taking ted hose off as needed  . Multiple Vitamin (MULTIVITAMIN) capsule Take 1 capsule by mouth daily.    Marland Kitchen omeprazole (PRILOSEC) 20 MG capsule Take 20 mg by mouth daily.   . polyethylene glycol (MIRALAX / GLYCOLAX) 17 g packet Take 17 g by mouth every other day.   . potassium chloride (K-DUR) 10 MEQ tablet Take 10 mEq by mouth as needed. Use when taking Lasix   No facility-administered encounter medications on file as of 06/10/2019.    Review of Systems  Constitutional: Negative for activity change, appetite change, chills, diaphoresis, fatigue, fever and unexpected weight change.  HENT: Negative for congestion.   Respiratory: Negative for cough, shortness of breath and wheezing.   Cardiovascular: Positive for leg swelling. Negative for chest pain and palpitations.  Gastrointestinal: Positive for constipation. Negative for abdominal distention, abdominal pain and diarrhea.  Genitourinary: Negative for difficulty urinating and dysuria.  Musculoskeletal: Positive for gait problem (festination). Negative for arthralgias, back pain, joint swelling and myalgias.  Neurological: Negative for dizziness, tremors, seizures, syncope, facial asymmetry, speech difficulty, weakness, light-headedness, numbness and headaches.  Psychiatric/Behavioral: Positive for agitation and confusion. Negative for behavioral problems, self-injury and sleep disturbance. The patient is nervous/anxious.     Immunization History  Administered Date(s) Administered  . PPD Test 04/29/2016  . Tdap 08/11/2014   Pertinent  Health Maintenance Due  Topic Date Due  . DEXA SCAN  Completed  . INFLUENZA VACCINE  Discontinued  . PNA vac Low Risk Adult  Discontinued   Fall Risk  01/13/2019 09/16/2018 05/13/2018 05/11/2018 03/18/2018  Falls in the past year? 0 0 1 0 Yes  Number falls in past yr: 0 0 0 0 1  Comment - - - - -   Injury with Fall? 0 0 0 0 No  Comment - - - - -  Risk Factor Category  - - - - -  Follow up - - - Falls evaluation completed -   Functional Status Survey:    Vitals:   06/10/19 1554  Weight: 120 lb (54.4 kg)   Body mass index is 20.6 kg/m. Physical Exam Vitals and nursing note reviewed.  Constitutional:      General: She is not in acute distress.    Appearance: She is not diaphoretic.  HENT:     Head: Normocephalic and atraumatic.  Neck:     Vascular: No JVD.  Cardiovascular:     Rate and Rhythm: Normal rate and regular rhythm.     Heart sounds: Murmur present.  Pulmonary:     Effort: Pulmonary effort is normal. No respiratory distress.     Breath sounds: Normal breath sounds. No wheezing.  Abdominal:     General: Bowel sounds are normal. There is distension (chronic and not firm or tender).  Palpations: Abdomen is soft.     Tenderness: There is no abdominal tenderness. There is no right CVA tenderness or left CVA tenderness.  Musculoskeletal:     Right lower leg: Edema (+1) present.     Left lower leg: Edema (+1) present.  Skin:    General: Skin is warm and dry.  Neurological:     General: No focal deficit present.     Mental Status: She is alert. Mental status is at baseline.     Gait: Gait abnormal.  Psychiatric:        Mood and Affect: Mood normal.     Labs reviewed: Recent Labs    02/01/19 0000 05/11/19 0000  NA 145 143  K 3.9 3.8  CL  --  103  CO2  --  27*  BUN 16 16  CREATININE 0.8 0.7  CALCIUM  --  9.5   Recent Labs    02/01/19 0600 05/11/19 0000  AST 20 20  ALT 21 6*  ALKPHOS 77 55  ALBUMIN  --  4.2   Recent Labs    12/07/18 0000 02/01/19 0000 05/11/19 0000  WBC 5.2 6.1 4.9  HGB 14.3 14.9 14.4  HCT 41 43 42  PLT 172 155 162   Lab Results  Component Value Date   TSH 1.76 05/11/2019   No results found for: HGBA1C Lab Results  Component Value Date   CHOL 199 08/12/2014   HDL 87 08/12/2014   LDLCALC 104 (H) 08/12/2014    LDLDIRECT 105.9 06/15/2013   TRIG 38 08/12/2014   CHOLHDL 2.3 08/12/2014    Significant Diagnostic Results in last 30 days:  No results found.  Assessment/Plan 1. DNR (do not resuscitate) Needed updated order in epic - DNR (Do Not Resuscitate)  2. 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 Continue current regimen. Would not taper ativan as needed order at this time.   3. Chronic constipation Controlled Continue miralax 17 grams qod   4. Hypothyroidism, unspecified type Lab Results  Component Value Date   TSH 1.76 05/11/2019   Continue Synthroid 75 mcg Sun, Monday, Wed, Friday and Saturday. Take 50 mcg on Tuesday and Thursday.  5. Aortic valve stenosis, etiology of cardiac valve disease unspecified No current symptoms, continue to monitor.   6. Chronic venous insufficiency Continue compression hose each day. Monitor weight Mon, Wed, and Friday. Administer lasix as needed for edema or weight gain.     Family/ staff Communication: discussed with her nurse Danielle  Labs/tests ordered:  NA

## 2019-06-14 DIAGNOSIS — Z9189 Other specified personal risk factors, not elsewhere classified: Secondary | ICD-10-CM | POA: Diagnosis not present

## 2019-06-14 DIAGNOSIS — Z20828 Contact with and (suspected) exposure to other viral communicable diseases: Secondary | ICD-10-CM | POA: Diagnosis not present

## 2019-06-17 DIAGNOSIS — Z9189 Other specified personal risk factors, not elsewhere classified: Secondary | ICD-10-CM | POA: Diagnosis not present

## 2019-06-17 DIAGNOSIS — Z20828 Contact with and (suspected) exposure to other viral communicable diseases: Secondary | ICD-10-CM | POA: Diagnosis not present

## 2019-06-21 DIAGNOSIS — Z9189 Other specified personal risk factors, not elsewhere classified: Secondary | ICD-10-CM | POA: Diagnosis not present

## 2019-06-21 DIAGNOSIS — Z20828 Contact with and (suspected) exposure to other viral communicable diseases: Secondary | ICD-10-CM | POA: Diagnosis not present

## 2019-06-28 DIAGNOSIS — Z9189 Other specified personal risk factors, not elsewhere classified: Secondary | ICD-10-CM | POA: Diagnosis not present

## 2019-06-28 DIAGNOSIS — Z20828 Contact with and (suspected) exposure to other viral communicable diseases: Secondary | ICD-10-CM | POA: Diagnosis not present

## 2019-07-06 ENCOUNTER — Encounter: Payer: Self-pay | Admitting: Internal Medicine

## 2019-07-08 ENCOUNTER — Non-Acute Institutional Stay (SKILLED_NURSING_FACILITY): Payer: Medicare Other | Admitting: Adult Health

## 2019-07-08 ENCOUNTER — Encounter: Payer: Self-pay | Admitting: Adult Health

## 2019-07-08 DIAGNOSIS — I872 Venous insufficiency (chronic) (peripheral): Secondary | ICD-10-CM | POA: Diagnosis not present

## 2019-07-08 DIAGNOSIS — F5101 Primary insomnia: Secondary | ICD-10-CM | POA: Diagnosis not present

## 2019-07-08 DIAGNOSIS — K5909 Other constipation: Secondary | ICD-10-CM

## 2019-07-08 DIAGNOSIS — F0280 Dementia in other diseases classified elsewhere without behavioral disturbance: Secondary | ICD-10-CM | POA: Diagnosis not present

## 2019-07-08 DIAGNOSIS — I10 Essential (primary) hypertension: Secondary | ICD-10-CM | POA: Diagnosis not present

## 2019-07-08 DIAGNOSIS — G20 Parkinson's disease: Secondary | ICD-10-CM

## 2019-07-09 NOTE — Progress Notes (Signed)
Location:  Occup[facility] psychologist of Service:  SNF (31) Provider:  Cindi Carbon, ANP [place] [phone]   Gayland Curry, DO  Patient Care Team: Gayland Curry, DO as PCP - General (Geriatric Medicine) Jola Schmidt, MD as Consulting Physician (Ophthalmology) Tat, Eustace Quail, DO as Consulting Physician (Neurology) [place]ique, Peter M, MD as Consulting Physician (Cardiology)  Extended Emergency Contact Information Primary Emergency Contact: [name] Address: [address]          [name] Phone: [phone] Mobile Phone: [phone] Relation: Daughter  Code Status:  DNR Goals of care: Advanced Directive information Advanced Directives 03/02/2019  Does Patient Have a Medical Advance Directive? Yes  Type of Paramedic of [place];Living will;Out of facility DNR (pink MOST or yellow form)  Does patient want to make changes to medical advance directive? No - Patient declined  Copy of [place] in Chart? Yes - validated most recent copy scanned in chart (See row information)  Would patient like information on creating a medical advance directive? -  Pre-existing out of facility DNR order (yellow form or pink MOST form) Yellow form placed in chart (order not valid for inpatient use)     Chief Complaint  Patient presents with  . Medical Management of Chronic Issues    HPI:  Pt is a 86 y.o. female seen today for medical management of chronic diseases.    Dementia with PD: continues to ambulate slowly with a walker. Has periods of agitation/delusions in the afternoon but has not used prn ativan in the past two weeks.   Reports are that she is not sleeping well at night and then sleeping during the day  Continues with infrequent bms, none recorded in several days but she also takes herself to the BR so its difficult to assess  BP is controlled in the [id number]  systolic  Moderate amt of edema in both legs is unchanged and she wears compression hose regularly  Hx of Gilberts syndrome with no symptoms  Lab Results  Component Value Date   ALT 6 (A) 05/11/2019   AST 20 05/11/2019   ALKPHOS 55 05/11/2019   BILITOT 0.9 12/04/2016     Past Medical History:  Diagnosis Date  . Aortic stenosis, mild   . Constipation, chronic   . Gilbert syndrome   . HTN (hypertension)   . Hyperlipidemia   . Hypothyroidism   . Melanoma ([place])   . Neuropathy   . Osteoporosis   . Parkinson's disease ([place])   . Peripheral neuropathy    Bilateral  . Rheumatic fever    does not take Flu shot    Past Surgical History:  Procedure Laterality Date  . ABDOMINAL HYSTERECTOMY  1963   no BSO; no abnormal PAPs  . COLONOSCOPY  2000   negative; [place]  . [place]  . MELANOMA EXCISION  08/2014    Allergies  Allergen Reactions  . Sulfonamide Derivatives     REACTION:generalized  swelling  . Gabapentin Hypertension and Swelling  . Sulfa Antibiotics Swelling    Outpatient Encounter Medications as of 07/08/2019  Medication Sig  . acetaminophen (TYLENOL) 325 MG tablet Take 325 mg by mouth every 6 (six) hours as needed for mild pain.  Marland Kitchen acetaminophen (TYLENOL) 325 MG tablet Take 325 mg by mouth at bedtime.  Marland Kitchen amLODipine (NORVASC) 5 MG tablet Take 5 mg by mouth daily.  . Calcium Carbonate-Vitamin  D (CALCIUM 600/VITAMIN D PO) Take 1 tablet by mouth daily.   . Carbidopa-Levodopa ER (SINEMET CR) 25-100 MG tablet controlled release 2 by mouth at 6 am, 1 by mouth at 11 am, and 1 by mouth at 4 pm  . Cholecalciferol (VITAMIN D) 2000 units CAPS Take 1 capsule by mouth daily.  . furosemide (LASIX) 20 MG tablet Take 10 mg by mouth as needed.   Marland Kitchen levothyroxine (SYNTHROID, LEVOTHROID) 50 MCG tablet Take one by mouth on Tuesday and Thursday 75 mcg other days  . levothyroxine (SYNTHROID, LEVOTHROID) 75 MCG tablet Take 1 by mouth on Sunday, Monday, Wednesday, Friday,  and Saturday  . LORazepam (ATIVAN) 0.5 MG tablet Take 0.5 tablets (0.25 mg total) by mouth 2 (two) times daily as needed for anxiety.  . Magnesium Sulfate, Laxative, (EPSOM SALT) GRAN To soak feet daily at bedtime after taking ted hose off as needed  . Multiple Vitamin (MULTIVITAMIN) capsule Take 1 capsule by mouth daily.    Marland Kitchen omeprazole (PRILOSEC) 20 MG capsule Take 20 mg by mouth daily.   . polyethylene glycol (MIRALAX / GLYCOLAX) 17 g packet Take 17 g by mouth every other day.   . potassium chloride (K-DUR) 10 MEQ tablet Take 10 mEq by mouth as needed. Use when taking Lasix   No facility-administered encounter medications on file as of 07/08/2019.    Review of Systems  Constitutional: Negative for activity change, appetite change, chills, diaphoresis, fatigue, fever and unexpected weight change.  HENT: Negative for congestion.   Respiratory: Negative for cough, shortness of breath and wheezing.   Cardiovascular: Positive for leg swelling. Negative for chest pain and palpitations.  Gastrointestinal: Positive for constipation. Negative for abdominal distention, abdominal pain and diarrhea.  Genitourinary: Negative for difficulty urinating and dysuria.  Musculoskeletal: Positive for gait problem. Negative for arthralgias, back pain, joint swelling and myalgias.  Neurological: Negative for dizziness, tremors, seizures, syncope, facial asymmetry, speech difficulty, weakness, light-headedness, numbness and headaches.  Psychiatric/Behavioral: Positive for agitation and confusion. Negative for behavioral problems.       Delusions    Immunization History  Administered Date(s) Administered  . Moderna SARS-COVID-2 Vaccination 06/29/2019  . PPD Test 04/29/2016  . Tdap 08/11/2014   Pertinent  Health Maintenance Due  Topic Date Due  . DEXA SCAN  Completed  . INFLUENZA VACCINE  Discontinued  . PNA vac Low Risk Adult  Discontinued   Fall Risk  01/13/2019 09/16/2018 05/13/2018 05/11/2018  03/18/2018  Falls in the past year? 0 0 1 0 Yes  Number falls in past yr: 0 0 0 0 1  Comment - - - - -  Injury with Fall? 0 0 0 0 No  Comment - - - - -  Risk Factor Category  - - - - -  Follow up - - - Falls evaluation completed -   Functional Status Survey:    Vitals:   07/09/19 0706  Weight: 120 lb 6.4 oz (54.6 kg)   Body mass index is 20.67 kg/m.  Wt Readings from Last 3 Encounters:  07/09/19 120 lb 6.4 oz (54.6 kg)  06/10/19 120 lb (54.4 kg)  05/14/19 116 lb (52.6 kg)    Physical Exam  Labs reviewed: Recent Labs    02/01/19 0000 05/11/19 0000  NA 145 143  K 3.9 3.8  CL  --  103  CO2  --  27*  BUN 16 16  CREATININE 0.8 0.7  CALCIUM  --  9.5   Recent Labs  02/01/19 0600 05/11/19 0000  AST 20 20  ALT 21 6*  ALKPHOS 77 55  ALBUMIN  --  4.2   Recent Labs    12/07/18 0000 02/01/19 0000 05/11/19 0000  WBC 5.2 6.1 4.9  HGB 14.3 14.9 14.4  HCT 41 43 42  PLT 172 155 162   Lab Results  Component Value Date   TSH 1.76 05/11/2019   No results found for: HGBA1C Lab Results  Component Value Date   CHOL 199 08/12/2014   HDL 87 08/12/2014   LDLCALC 104 (H) 08/12/2014   LDLDIRECT 105.9 06/15/2013   TRIG 38 08/12/2014   CHOLHDL 2.3 08/12/2014    Significant Diagnostic Results in last 30 days:  No results found.  Assessment/Plan 1. Primary insomnia Begin Melatonin 5 mg qhs to help regain proper sleep wake cycle  2. Chronic venous insufficiency Continue lasix as needed and compression hose   3. Essential hypertension Controlled. Continue Norvasc 5 mg qd  4. Chronic constipation Increase miralax to 17 grams qd and monitor for response  5. Gilbert's syndrome Noted to possibly be at risk for adverse drug reaction due to this issue but has not been an issue here in the memory care unit. Monitor labs periodically.  6. 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 Doing well in the memory care unit. Will continue as  needed ativan for now due to periods of agitation. Continues on sinemet for PD.     Family/ staff Communication: nurse  Labs/tests ordered:  NA

## 2019-07-14 DIAGNOSIS — D692 Other nonthrombocytopenic purpura: Secondary | ICD-10-CM | POA: Diagnosis not present

## 2019-07-14 DIAGNOSIS — L814 Other melanin hyperpigmentation: Secondary | ICD-10-CM | POA: Diagnosis not present

## 2019-07-14 DIAGNOSIS — D1801 Hemangioma of skin and subcutaneous tissue: Secondary | ICD-10-CM | POA: Diagnosis not present

## 2019-07-14 DIAGNOSIS — L821 Other seborrheic keratosis: Secondary | ICD-10-CM | POA: Diagnosis not present

## 2019-07-14 DIAGNOSIS — Z85828 Personal history of other malignant neoplasm of skin: Secondary | ICD-10-CM | POA: Diagnosis not present

## 2019-07-14 DIAGNOSIS — Z85820 Personal history of malignant melanoma of skin: Secondary | ICD-10-CM | POA: Diagnosis not present

## 2019-08-03 ENCOUNTER — Encounter: Payer: Self-pay | Admitting: Internal Medicine

## 2019-08-03 ENCOUNTER — Non-Acute Institutional Stay (SKILLED_NURSING_FACILITY): Payer: Medicare Other | Admitting: Internal Medicine

## 2019-08-03 DIAGNOSIS — G609 Hereditary and idiopathic neuropathy, unspecified: Secondary | ICD-10-CM | POA: Diagnosis not present

## 2019-08-03 DIAGNOSIS — S81812A Laceration without foreign body, left lower leg, initial encounter: Secondary | ICD-10-CM

## 2019-08-03 DIAGNOSIS — G20A1 Parkinson's disease without dyskinesia, without mention of fluctuations: Secondary | ICD-10-CM

## 2019-08-03 DIAGNOSIS — G20 Parkinson's disease: Secondary | ICD-10-CM | POA: Diagnosis not present

## 2019-08-03 DIAGNOSIS — F0280 Dementia in other diseases classified elsewhere without behavioral disturbance: Secondary | ICD-10-CM

## 2019-08-03 DIAGNOSIS — I872 Venous insufficiency (chronic) (peripheral): Secondary | ICD-10-CM

## 2019-08-03 DIAGNOSIS — K5909 Other constipation: Secondary | ICD-10-CM

## 2019-08-03 NOTE — Progress Notes (Signed)
Patient ID: Natalie Lam, female   DOB: [birth year], 86 y.o.   MRN: [id number]  Location:  [facility] Room Number: [facility]:  SNF [phone]) Provider:   Gayland Curry, DO  Patient Care Team: Gayland Curry, DO as PCP - General (Geriatric Medicine) Jola Schmidt, MD as Consulting Physician (Ophthalmology) Tat, Eustace Quail, DO as Consulting Physician (Neurology) Martinique, Peter M, MD as Consulting Physician (Cardiology)  Extended Emergency Contact Information Primary Emergency Contact: [name] Address: [address]          [place] Phone: [phone] Mobile Phone: [phone] Relation: Daughter  Code Status:  DNR, MOST Goals of care: Advanced Directive information Advanced Directives 03/02/2019  Does Patient Have a Medical Advance Directive? Yes  Type of Paramedic of Section;Living will;Out of facility DNR (pink MOST or yellow form)  Does patient want to make changes to medical advance directive? No - Patient declined  Copy of [place] in Chart? Yes - validated most recent copy scanned in chart (See row information)  Would patient like information on creating a medical advance directive? -  Pre-existing out of facility DNR order (yellow form or pink MOST form) Yellow form placed in chart (order not valid for inpatient use)     Chief Complaint  Patient presents with  . Medical Management of Chronic Issues    Routine Follow up     HPI:  Pt is a 86 y.o. female seen today for medical management of chronic diseases.  She has parkinson's disease and has had longstanding paranoid psychosis with good and bad periods of time as far as her behaviors go.  When seen today, she was extremely pleasant and sociable, her usual sweet self telling me stories from her childhood.    Upon discussing with the dayshift nurse, I learned that Natalie Lam is having  more difficulty with sundowning and agitation, paranoia in the evening around change of shift.  Staff are struggling to get her showered and ready for bed.  She is looking for her mother, paranoid and gets very upset and distressed.  Ativan 0.25mg  po bid prn has been used now for months since she came, but is not effective.    Physically, she's done well and her weights have been fairly stable--they had been reduced from daily to 3x per week quite a while ago.  She wears compression hose in the day and off at hs.  She has had a left lower extremity skin tear on the anterior shin that appears to have a layer of skin that did not approximate and has dried up, there is mild erythema to the area surrounding based on a photo the nurse had taken (resident was up dining at the time of my visit).  She had no c/o pain.    Here's a piece of nurses' note from 3/6:  "Multiple staff members attempted to assist with this yet she adamantly refused. She states that she can and will do it herself and that she doesn't need anyone's help. She becomes enraged when staff question her about her brief being changed or attempt to offer assistance with this, shouting at staff to "[place]." Resident states that she does not know what is going on around here but she wants to call her daughter or the police and report to them what is happening. Moments later resident walked down  the hallway and stood at the entrance of the hallway, near the common area, where she then stood for several minutes."  Bowels are moving with bowel regimen.  She does sleep at night.  She eats well.    Past Medical History:  Diagnosis Date  . Aortic stenosis, mild   . Constipation, chronic   . Gilbert syndrome   . HTN (hypertension)   . Hyperlipidemia   . Hypothyroidism   . Melanoma ([place])   . Neuropathy   . Osteoporosis   . Parkinson's disease ([place])   . Peripheral neuropathy    Bilateral  . Rheumatic fever    does not  take Flu shot    Past Surgical History:  Procedure Laterality Date  . ABDOMINAL HYSTERECTOMY  1963   no BSO; no abnormal PAPs  . COLONOSCOPY  2000   negative; [place]  . [place]  . MELANOMA EXCISION  08/2014    Allergies  Allergen Reactions  . Sulfonamide Derivatives     REACTION:generalized  swelling  . Gabapentin Hypertension and Swelling  . Sulfa Antibiotics Swelling    Outpatient Encounter Medications as of 08/03/2019  Medication Sig  . acetaminophen (TYLENOL) 325 MG tablet Take 325 mg by mouth every 6 (six) hours as needed for mild pain.  Marland Kitchen acetaminophen (TYLENOL) 325 MG tablet Take 325 mg by mouth at bedtime.  Marland Kitchen amLODipine (NORVASC) 5 MG tablet Take 5 mg by mouth daily.  . Calcium Carbonate-Vitamin D (CALCIUM 600/VITAMIN D PO) Take 1 tablet by mouth daily.   . Carbidopa-Levodopa ER (SINEMET CR) 25-100 MG tablet controlled release 2 by mouth at 6 am, 1 by mouth at 11 am, and 1 by mouth at 4 pm  . Cholecalciferol (VITAMIN D) 2000 units CAPS Take 1 capsule by mouth daily.  . furosemide (LASIX) 20 MG tablet Take 10 mg by mouth as needed.   Marland Kitchen levothyroxine (SYNTHROID, LEVOTHROID) 50 MCG tablet Take one by mouth on Tuesday and Thursday 75 mcg other days  . levothyroxine (SYNTHROID, LEVOTHROID) 75 MCG tablet Take 1 by mouth on Sunday, Monday, Wednesday, Friday, and Saturday  . LORazepam (ATIVAN) 0.5 MG tablet Take 0.5 tablets (0.25 mg total) by mouth 2 (two) times daily as needed for anxiety.  . Magnesium Sulfate, Laxative, (EPSOM SALT) GRAN To soak feet daily at bedtime after taking ted hose off as needed  . Melatonin 5 MG CAPS Take by mouth.  . Multiple Vitamin (MULTIVITAMIN) capsule Take 1 capsule by mouth daily.    Marland Kitchen omeprazole (PRILOSEC) 20 MG capsule Take 20 mg by mouth daily.   . polyethylene glycol (MIRALAX / GLYCOLAX) 17 g packet Take 17 g by mouth daily.   . potassium chloride (K-DUR) 10 MEQ tablet Take 10 mEq by mouth as needed. Use when taking Lasix    No facility-administered encounter medications on file as of 08/03/2019.    Review of Systems  Constitutional: Negative for activity change, chills, fever and unexpected weight change.  HENT: Negative for congestion, sinus pain and sore throat.   Eyes: Negative for visual disturbance.  Respiratory: Negative for cough and shortness of breath.   Cardiovascular: Positive for leg swelling.  Gastrointestinal: Positive for constipation. Negative for abdominal pain, diarrhea, nausea and vomiting.  Genitourinary: Negative for dysuria.  Musculoskeletal: Positive for gait problem. Negative for arthralgias and back pain.  Skin: Negative for color change.  Neurological: Positive for tremors. Negative for dizziness and weakness.  Hematological: Bruises/bleeds easily.  Psychiatric/Behavioral: Positive for agitation, behavioral problems  and confusion. Negative for sleep disturbance.       Paranoid delusions    Immunization History  Administered Date(s) Administered  . Moderna SARS-COVID-2 Vaccination 06/29/2019, 07/27/2019  . PPD Test 04/29/2016  . Tdap 08/11/2014   Pertinent  Health Maintenance Due  Topic Date Due  . DEXA SCAN  Completed  . INFLUENZA VACCINE  Discontinued  . PNA vac Low Risk Adult  Discontinued   Fall Risk  01/13/2019 09/16/2018 05/13/2018 05/11/2018 03/18/2018  Falls in the past year? 0 0 1 0 Yes  Number falls in past yr: 0 0 0 0 1  Comment - - - - -  Injury with Fall? 0 0 0 0 No  Comment - - - - -  Risk Factor Category  - - - - -  Follow up - - - Falls evaluation completed -   Functional Status Survey:    Vitals:   08/03/19 0923  BP: 136/74  Pulse: (!) 54  Temp: (!) 97 F (36.1 C)   There is no height or weight on file to calculate BMI. Physical Exam Vitals and nursing note reviewed.  Constitutional:      General: She is not in acute distress.    Appearance: Normal appearance. She is not toxic-appearing.  HENT:     Head: Normocephalic and atraumatic.   Eyes:     Comments: Not wearing glasses  Cardiovascular:     Rate and Rhythm: Normal rate and regular rhythm.     Heart sounds: Murmur present.  Pulmonary:     Effort: Pulmonary effort is normal.     Breath sounds: Normal breath sounds. No rales.  Abdominal:     General: Bowel sounds are normal. There is distension.     Palpations: Abdomen is soft. There is no mass.     Tenderness: There is no abdominal tenderness.     Comments: Postural distension  Musculoskeletal:     Right lower leg: Edema present.     Left lower leg: Edema present.     Comments: Stable edema improved by compression hose; uses walker  Skin:    Comments: LLE skin tear with dry skin piece gathered up and exposed under layer, mild erythema of wound and surrounding skin  Neurological:     Mental Status: She is alert.     Gait: Gait abnormal.     Comments: Shuffling gait, restring tremor  Psychiatric:        Mood and Affect: Mood normal.     Comments: Pleasant and calm at present (see hpi)     Labs reviewed: Recent Labs    02/01/19 0000 05/11/19 0000  NA 145 143  K 3.9 3.8  CL  --  103  CO2  --  27*  BUN 16 16  CREATININE 0.8 0.7  CALCIUM  --  9.5   Recent Labs    02/01/19 0600 05/11/19 0000  AST 20 20  ALT 21 6*  ALKPHOS 77 55  ALBUMIN  --  4.2   Recent Labs    12/07/18 0000 02/01/19 0000 05/11/19 0000  WBC 5.2 6.1 4.9  HGB 14.3 14.9 14.4  HCT 41 43 42  PLT 172 155 162   Lab Results  Component Value Date   TSH 1.76 05/11/2019   No results found for: HGBA1C Lab Results  Component Value Date   CHOL 199 08/12/2014   HDL 87 08/12/2014   LDLCALC 104 (H) 08/12/2014   LDLDIRECT 105.9 06/15/2013   TRIG 38 08/12/2014  CHOLHDL 2.3 08/12/2014    Assessment/Plan 1. 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 Dementia associated with Parkinson's disease ([place]) -is progressing and she seems to do well in memory care by day--has become sociable with a couple of the other newer admits to memory care who also converse more -psychosis aspect is worsening and now interfering with care and causing her distress  3. Parkinson's disease ([facility] [place]) -cont current sinemet therapy, walker, assistance with adls  4. Chronic venous insufficiency -continue compression hose on in am and off at hs; encourage her to elevate feet at rest -decrease weights to weekly and notify for >5# increase or decrease -has prn lasix for weight gain or increasing edema  5. Chronic constipation -stable with miralax daily and prn mag sulfate  6. Hereditary and idiopathic peripheral neuropathy -not on meds for this, affects balance and makes prone to falls  7. Skin tear of left lower leg without complication, initial encounter -from fall, does not currently appear infected, cont polymem and compression hose  Family/ staff Communication: discussed with memory care dayshift nurse  Labs/tests ordered:  No new today  Sagan Maselli L. Jesua Tamblyn, D.O. [facility] [address], [place] [address] Cell Phone (Mon-Fri 8am-5pm):  [phone] On Call:  [phone] & follow prompts after 5pm & weekends Office Phone:  [phone] Office Fax:  [phone]

## 2019-08-19 ENCOUNTER — Non-Acute Institutional Stay (SKILLED_NURSING_FACILITY): Payer: Medicare Other | Admitting: Adult Health

## 2019-08-19 ENCOUNTER — Encounter: Payer: Self-pay | Admitting: Adult Health

## 2019-08-19 DIAGNOSIS — R600 Localized edema: Secondary | ICD-10-CM

## 2019-08-19 DIAGNOSIS — I350 Nonrheumatic aortic (valve) stenosis: Secondary | ICD-10-CM

## 2019-08-19 DIAGNOSIS — I872 Venous insufficiency (chronic) (peripheral): Secondary | ICD-10-CM

## 2019-08-19 NOTE — Progress Notes (Signed)
Location:  [facility] Service:  SNF (31) Provider:   Cindi Carbon, ANP [place] [phone]   Gayland Curry, DO  Patient Care Team: Gayland Curry, DO as PCP - General (Geriatric Medicine) Jola Schmidt, MD as Consulting Physician (Ophthalmology) Tat, Eustace Quail, DO as Consulting Physician (Neurology) Martinique, Peter M, MD as Consulting Physician (Cardiology)  Extended Emergency Contact Information Primary Emergency Contact: [name] Address: [address]          [name] Phone: [phone] Mobile Phone: [phone] Relation: Daughter  Code Status:  DNR Goals of care: Advanced Directive information Advanced Directives 08/03/2019  Does Patient Have a Medical Advance Directive? Yes  Type of Advance Directive Out of facility DNR (pink MOST or yellow form)  Does patient want to make changes to medical advance directive? No - Patient declined  Copy of [place] in Chart? -  Would patient like information on creating a medical advance directive? -  Pre-existing out of facility DNR order (yellow form or pink MOST form) Pink MOST/Yellow Form most recent copy in chart - Physician notified to receive inpatient order     Chief Complaint  Patient presents with  . Acute Visit    increased edema     HPI:  Pt is a 86 y.o. female seen today for an acute visit for increased edema. Ms. Furia has hx of PD with dementia and resides in the memory care unit. The nurse reports that she has chronic edema but it has been worse with slight redness to both lower ext for the past few days. Her weight is up 5 lbs from last month at 125 lbs but she is eating less over time. They gave her prn dose of lasix 10 mg yesterday with no benefit. She has a skin tear on each leg that are difficult to heal and have clear fluid draining from them.  Ms. Novicki reports no sob, syncope, pnd, doe, or cp.  She has a hx of venous insuff and varicose veins. Tends to be worse on the left. Prior venous doppler studies were negative in 2016. Wt Readings from Last 3 Encounters:  08/19/19 125 lb 9.6 oz (57 kg)  07/09/19 120 lb 6.4 oz (54.6 kg)  06/10/19 120 lb (54.4 kg)     Past Medical History:  Diagnosis Date  . Aortic stenosis, mild   . Constipation, chronic   . Gilbert syndrome   . HTN (hypertension)   . Hyperlipidemia   . Hypothyroidism   . Melanoma ([place])   . Neuropathy   . Osteoporosis   . Parkinson's disease ([employer])   . Peripheral neuropathy    Bilateral  . Rheumatic fever    does not take Flu shot    Past Surgical History:  Procedure Laterality Date  . ABDOMINAL HYSTERECTOMY  1963   no BSO; no abnormal PAPs  . COLONOSCOPY  2000   negative; [place]  . [place]  . MELANOMA EXCISION  08/2014    Allergies  Allergen Reactions  . Sulfonamide Derivatives     REACTION:generalized  swelling  . Gabapentin Hypertension and Swelling  . Sulfa Antibiotics Swelling    Outpatient Encounter Medications as of 08/19/2019  Medication Sig  . acetaminophen (TYLENOL) 325 MG tablet Take 325 mg by mouth every 6 (six) hours as needed for mild pain.  Marland Kitchen acetaminophen (TYLENOL) 325 MG tablet Take 325 mg by mouth at  bedtime.  Marland Kitchen amLODipine (NORVASC) 5 MG tablet Take 5 mg by mouth daily.  . Calcium Carbonate-Vitamin D (CALCIUM 600/VITAMIN D PO) Take 1 tablet by mouth daily.   . Carbidopa-Levodopa ER (SINEMET CR) 25-100 MG tablet controlled release 2 by mouth at 6 am, 1 by mouth at 11 am, and 1 by mouth at 4 pm  . Cholecalciferol (VITAMIN D) 2000 units CAPS Take 1 capsule by mouth daily.  . furosemide (LASIX) 20 MG tablet Take 10 mg by mouth as needed.   Marland Kitchen levothyroxine (SYNTHROID, LEVOTHROID) 50 MCG tablet Take one by mouth on Tuesday and Thursday 75 mcg other days  . levothyroxine (SYNTHROID, LEVOTHROID) 75 MCG tablet Take 1 by mouth on Sunday, Monday, Wednesday, Friday, and  Saturday  . LORazepam (ATIVAN) 0.5 MG tablet Take 0.5 tablets (0.25 mg total) by mouth 2 (two) times daily as needed for anxiety.  . Magnesium Sulfate, Laxative, (EPSOM SALT) GRAN To soak feet daily at bedtime after taking ted hose off as needed  . Melatonin 5 MG CAPS Take by mouth.  . Multiple Vitamin (MULTIVITAMIN) capsule Take 1 capsule by mouth daily.    Marland Kitchen omeprazole (PRILOSEC) 20 MG capsule Take 20 mg by mouth daily.   . polyethylene glycol (MIRALAX / GLYCOLAX) 17 g packet Take 17 g by mouth daily.   . potassium chloride (K-DUR) 10 MEQ tablet Take 10 mEq by mouth as needed. Use when taking Lasix   No facility-administered encounter medications on file as of 08/19/2019.    Review of Systems  Constitutional: Positive for unexpected weight change. Negative for activity change, appetite change, chills, diaphoresis, fatigue and fever.  HENT: Negative for congestion.   Respiratory: Negative for cough, shortness of breath and wheezing.   Cardiovascular: Positive for leg swelling. Negative for chest pain and palpitations.  Gastrointestinal: Positive for abdominal distention (chronic rotund). Negative for abdominal pain, constipation and diarrhea.  Genitourinary: Negative for difficulty urinating and dysuria.  Musculoskeletal: Positive for gait problem. Negative for arthralgias, back pain, joint swelling and myalgias.  Neurological: Negative for dizziness, tremors, seizures, syncope, facial asymmetry, speech difficulty, weakness, light-headedness, numbness and headaches.       Slow gait  Psychiatric/Behavioral: Positive for behavioral problems and confusion.    Immunization History  Administered Date(s) Administered  . Moderna SARS-COVID-2 Vaccination 06/29/2019, 07/27/2019  . PPD Test 04/29/2016  . Tdap 08/11/2014   Pertinent  Health Maintenance Due  Topic Date Due  . DEXA SCAN  Completed  . INFLUENZA VACCINE  Discontinued  . PNA vac Low Risk Adult  Discontinued   Fall Risk   01/13/2019 09/16/2018 05/13/2018 05/11/2018 03/18/2018  Falls in the past year? 0 0 1 0 Yes  Number falls in past yr: 0 0 0 0 1  Comment - - - - -  Injury with Fall? 0 0 0 0 No  Comment - - - - -  Risk Factor Category  - - - - -  Follow up - - - Falls evaluation completed -   Functional Status Survey:    Vitals:   08/19/19 1634  BP: 132/68  Pulse: (!) 59  Resp: 16  Temp: 97.9 F (36.6 C)  SpO2: 97%  Weight: 125 lb 9.6 oz (57 kg)   Body mass index is 21.56 kg/m. Physical Exam Vitals and nursing note reviewed.  Constitutional:      General: She is not in acute distress.    Appearance: She is not diaphoretic.  HENT:     Head: Normocephalic and  atraumatic.  Neck:     Vascular: No JVD.  Cardiovascular:     Rate and Rhythm: Normal rate and regular rhythm.     Heart sounds: Murmur present.  Pulmonary:     Effort: Pulmonary effort is normal. No respiratory distress.     Breath sounds: Normal breath sounds. No wheezing.  Abdominal:     General: Bowel sounds are normal. There is distension (chronic rotund ).     Palpations: Abdomen is soft.  Musculoskeletal:     Right lower leg: Edema (+2) present.     Left lower leg: Edema (+2 concentrated in the calf) present.  Skin:    General: Skin is warm and dry.     Comments: BLE erythema and mild warmth. Significant varicose veins to both legs. Small skin tears to right and left calf area that do not have purulent drainage or surrounding redness.   Neurological:     Mental Status: She is alert and oriented to person, place, and time.  Psychiatric:        Mood and Affect: Mood normal.     Labs reviewed: Recent Labs    02/01/19 0000 05/11/19 0000  NA 145 143  K 3.9 3.8  CL  --  103  CO2  --  27*  BUN 16 16  CREATININE 0.8 0.7  CALCIUM  --  9.5   Recent Labs    02/01/19 0600 05/11/19 0000  AST 20 20  ALT 21 6*  ALKPHOS 77 55  ALBUMIN  --  4.2   Recent Labs    12/07/18 0000 02/01/19 0000 05/11/19 0000  WBC 5.2  6.1 4.9  HGB 14.3 14.9 14.4  HCT 41 43 42  PLT 172 155 162   Lab Results  Component Value Date   TSH 1.76 05/11/2019   No results found for: HGBA1C Lab Results  Component Value Date   CHOL 199 08/12/2014   HDL 87 08/12/2014   LDLCALC 104 (H) 08/12/2014   LDLDIRECT 105.9 06/15/2013   TRIG 38 08/12/2014   CHOLHDL 2.3 08/12/2014    Significant Diagnostic Results in last 30 days:  No results found.  Assessment/Plan 1. Localized edema To BLE likely due to venous insuff as she has no hx of CHF and respiratory symptoms with significant varicose veins.  Given lasix 20 mg x 1 and Kdur 10 meq x 1  2. Chronic venous insufficiency Apply double layer compression wraps each day and remove each evening.  Keep legs elevated when possible. Monitor for increased warmth, pain, redness, fever or drainage.   3. Aortic valve stenosis, etiology of cardiac valve disease unspecified Noted on exam. Echo indicated mild in 2016.  No current symptoms.  Would avoid aggressive diuresis.     Family/ staff Communication: resident and nurse  Labs/tests ordered:  NA

## 2019-08-20 ENCOUNTER — Non-Acute Institutional Stay (SKILLED_NURSING_FACILITY): Payer: Medicare Other | Admitting: Adult Health

## 2019-08-20 ENCOUNTER — Encounter: Payer: Self-pay | Admitting: Adult Health

## 2019-08-20 DIAGNOSIS — B353 Tinea pedis: Secondary | ICD-10-CM

## 2019-08-20 DIAGNOSIS — L03119 Cellulitis of unspecified part of limb: Secondary | ICD-10-CM

## 2019-08-20 NOTE — Progress Notes (Signed)
Location:  [facility] of Service:  SNF (31) Provider:   Cindi Carbon, ANP [place] [phone]   Gayland Curry, DO  Patient Care Team: Gayland Curry, DO as PCP - General (Geriatric Medicine) Jola Schmidt, MD as Consulting Physician (Ophthalmology) Tat, Eustace Quail, DO as Consulting Physician (Neurology) Martinique, Peter M, MD as Consulting Physician (Cardiology)  Extended Emergency Contact Information Primary Emergency Contact: [name] Address: [address]          [name] Phone: [phone] Mobile Phone: [phone] Relation: Daughter  Code Status:  DNR Goals of care: Advanced Directive information Advanced Directives 08/03/2019  Does Patient Have a Medical Advance Directive? Yes  Type of Advance Directive Out of facility DNR (pink MOST or yellow form)  Does patient want to make changes to medical advance directive? No - Patient declined  Copy of [place] in Chart? -  Would patient like information on creating a medical advance directive? -  Pre-existing out of facility DNR order (yellow form or pink MOST form) Pink MOST/Yellow Form most recent copy in chart - Physician notified to receive inpatient order     Chief Complaint  Patient presents with  . Acute Visit    concern for cellulitis    HPI:  Pt is a 86 y.o. female seen today for an acute visit for concern for cellulitis. I saw Ms. Lantis one day ago for increased swelling of both legs and 5 lb weight gain. Lasix was prescribed 20 mg x 1 dose and compression wraps and elevation were ordered due to hx of venous stasis. This morning Ms. Cullars's daughter is at the bedside and reports that her legs have increased erythema to both legs and white discharge between the toes. She also had an episode where she was sweaty but no fever was recorded. Temp 08/20/2019 98 degrees. She has not been weighed but the nurse  reports that her edema is improved after the lasix and compression wraps. Her daughter is requesting foot soaks for her tinea pedis. Her daughter also reports that Ms. Strope is not likely to report pain and has neuropathy in her feet and pain not feel pain related to cellulitis.    Past Medical History:  Diagnosis Date  . Aortic stenosis, mild   . Constipation, chronic   . Gilbert syndrome   . HTN (hypertension)   . Hyperlipidemia   . Hypothyroidism   . Melanoma ([place])   . Neuropathy   . Osteoporosis   . Parkinson's disease ([place])   . Peripheral neuropathy    Bilateral  . Rheumatic fever    does not take Flu shot    Past Surgical History:  Procedure Laterality Date  . ABDOMINAL HYSTERECTOMY  1963   no BSO; no abnormal PAPs  . COLONOSCOPY  2000   negative; [place]  . [place]  . MELANOMA EXCISION  08/2014    Allergies  Allergen Reactions  . Sulfonamide Derivatives     REACTION:generalized  swelling  . Gabapentin Hypertension and Swelling  . Sulfa Antibiotics Swelling    Outpatient Encounter Medications as of 08/20/2019  Medication Sig  . acetaminophen (TYLENOL) 325 MG tablet Take 325 mg by mouth every 6 (six) hours as needed for mild pain.  Marland Kitchen acetaminophen (TYLENOL) 325 MG tablet Take 325 mg by mouth at bedtime.  Marland Kitchen amLODipine (NORVASC) 5 MG tablet Take 5 mg by mouth daily.  Marland Kitchen  Calcium Carbonate-Vitamin D (CALCIUM 600/VITAMIN D PO) Take 1 tablet by mouth daily.   . Carbidopa-Levodopa ER (SINEMET CR) 25-100 MG tablet controlled release 2 by mouth at 6 am, 1 by mouth at 11 am, and 1 by mouth at 4 pm  . Cholecalciferol (VITAMIN D) 2000 units CAPS Take 1 capsule by mouth daily.  . furosemide (LASIX) 20 MG tablet Take 10 mg by mouth as needed.   Marland Kitchen levothyroxine (SYNTHROID, LEVOTHROID) 50 MCG tablet Take one by mouth on Tuesday and Thursday 75 mcg other days  . levothyroxine (SYNTHROID, LEVOTHROID) 75 MCG tablet Take 1 by mouth on Sunday, Monday, Wednesday, Friday,  and Saturday  . LORazepam (ATIVAN) 0.5 MG tablet Take 0.5 tablets (0.25 mg total) by mouth 2 (two) times daily as needed for anxiety.  . Magnesium Sulfate, Laxative, (EPSOM SALT) GRAN To soak feet daily at bedtime after taking ted hose off as needed  . Melatonin 5 MG CAPS Take by mouth.  . Multiple Vitamin (MULTIVITAMIN) capsule Take 1 capsule by mouth daily.    Marland Kitchen omeprazole (PRILOSEC) 20 MG capsule Take 20 mg by mouth daily.   . polyethylene glycol (MIRALAX / GLYCOLAX) 17 g packet Take 17 g by mouth daily.   . potassium chloride (K-DUR) 10 MEQ tablet Take 10 mEq by mouth as needed. Use when taking Lasix   No facility-administered encounter medications on file as of 08/20/2019.    Review of Systems  Constitutional: Negative for activity change, appetite change, chills, diaphoresis, fatigue, fever and unexpected weight change.  HENT: Negative for congestion.   Respiratory: Negative for cough, shortness of breath and wheezing.   Cardiovascular: Positive for leg swelling. Negative for chest pain and palpitations.  Gastrointestinal: Negative for abdominal distention, abdominal pain, constipation and diarrhea.  Genitourinary: Negative for difficulty urinating and dysuria.  Musculoskeletal: Positive for gait problem. Negative for arthralgias, back pain, joint swelling and myalgias.  Neurological: Positive for numbness. Negative for dizziness, tremors, seizures, syncope, facial asymmetry, speech difficulty, weakness, light-headedness and headaches.  Psychiatric/Behavioral: Positive for behavioral problems and confusion. Negative for agitation.    Immunization History  Administered Date(s) Administered  . Moderna SARS-COVID-2 Vaccination 06/29/2019, 07/27/2019  . PPD Test 04/29/2016  . Tdap 08/11/2014   Pertinent  Health Maintenance Due  Topic Date Due  . DEXA SCAN  Completed  . INFLUENZA VACCINE  Discontinued  . PNA vac Low Risk Adult  Discontinued   Fall Risk  01/13/2019 09/16/2018  05/13/2018 05/11/2018 03/18/2018  Falls in the past year? 0 0 1 0 Yes  Number falls in past yr: 0 0 0 0 1  Comment - - - - -  Injury with Fall? 0 0 0 0 No  Comment - - - - -  Risk Factor Category  - - - - -  Follow up - - - Falls evaluation completed -   Functional Status Survey:    Vitals:   08/20/19 1214  Temp: 98 F (36.7 C)   There is no height or weight on file to calculate BMI. Physical Exam Vitals and nursing note reviewed.  Constitutional:      General: She is not in acute distress.    Appearance: She is not diaphoretic.  HENT:     Head: Normocephalic and atraumatic.  Neck:     Vascular: No JVD.  Cardiovascular:     Rate and Rhythm: Normal rate and regular rhythm.     Heart sounds: Murmur present.  Pulmonary:     Effort: Pulmonary effort  is normal. No respiratory distress.     Breath sounds: Normal breath sounds. No wheezing.  Musculoskeletal:     Right lower leg: Right lower leg edema: trace.     Left lower leg: Edema (+1) present.  Skin:    General: Skin is warm and dry.     Comments: BLE erythema and warmth but no purulent drainage. No red streaking. No TTP to either calf. Skin tear on the left has polymem in place and is CDI, reviewed on 3/25.   White discharge in between toes and odor.   Neurological:     General: No focal deficit present.     Mental Status: She is alert. Mental status is at baseline.  Psychiatric:        Mood and Affect: Mood normal.     Labs reviewed: Recent Labs    02/01/19 0000 05/11/19 0000  NA 145 143  K 3.9 3.8  CL  --  103  CO2  --  27*  BUN 16 16  CREATININE 0.8 0.7  CALCIUM  --  9.5   Recent Labs    02/01/19 0600 05/11/19 0000  AST 20 20  ALT 21 6*  ALKPHOS 77 55  ALBUMIN  --  4.2   Recent Labs    12/07/18 0000 02/01/19 0000 05/11/19 0000  WBC 5.2 6.1 4.9  HGB 14.3 14.9 14.4  HCT 41 43 42  PLT 172 155 162   Lab Results  Component Value Date   TSH 1.76 05/11/2019   No results found for: HGBA1C  Lab Results  Component Value Date   CHOL 199 08/12/2014   HDL 87 08/12/2014   LDLCALC 104 (H) 08/12/2014   LDLDIRECT 105.9 06/15/2013   TRIG 38 08/12/2014   CHOLHDL 2.3 08/12/2014    Significant Diagnostic Results in last 30 days:  No results found.  Assessment/Plan 1. Cellulitis of lower extremity, unspecified laterality Difficult to differentiate between cellulitis and venous stasis dermatitis in the setting. Her daughter is concerned as the redness is slightly worse and she is had an episode of sweats. No fever or red streaking or signs of toxicity are noted.  Will try Keflex 500 mg tid x 7 days Leg elevation and wraps as previously ordered  2. Tinea pedis of both feet Cleanse feet with dial soap and soak in warm water 15 min qday. May try 1/4 cup of hydrogen peroxide in water per daughter request. Dry in between toes each day.     Family/ staff Communication: resident and her daughter  Labs/tests ordered:  NA

## 2019-09-03 ENCOUNTER — Non-Acute Institutional Stay (SKILLED_NURSING_FACILITY): Payer: Medicare Other | Admitting: Adult Health

## 2019-09-03 DIAGNOSIS — I872 Venous insufficiency (chronic) (peripheral): Secondary | ICD-10-CM

## 2019-09-03 DIAGNOSIS — F0280 Dementia in other diseases classified elsewhere without behavioral disturbance: Secondary | ICD-10-CM

## 2019-09-03 DIAGNOSIS — G20 Parkinson's disease: Secondary | ICD-10-CM | POA: Diagnosis not present

## 2019-09-03 DIAGNOSIS — K5909 Other constipation: Secondary | ICD-10-CM | POA: Diagnosis not present

## 2019-09-03 DIAGNOSIS — E039 Hypothyroidism, unspecified: Secondary | ICD-10-CM | POA: Diagnosis not present

## 2019-09-03 DIAGNOSIS — I10 Essential (primary) hypertension: Secondary | ICD-10-CM

## 2019-09-06 ENCOUNTER — Encounter: Payer: Self-pay | Admitting: Adult Health

## 2019-09-06 NOTE — Progress Notes (Signed)
Location:  [facility] psychologist of Service:  SNF (31) Provider:   Cindi Carbon, ANP [place] [phone]   Gayland Curry, DO  Patient Care Team: Gayland Curry, DO as PCP - General (Geriatric Medicine) Jola Schmidt, MD as Consulting Physician (Ophthalmology) Tat, Eustace Quail, DO as Consulting Physician (Neurology) Martinique, Peter M, MD as Consulting Physician (Cardiology)  Extended Emergency Contact Information Primary Emergency Contact: [name] Address: [address]          [name] Phone: [phone] Mobile Phone: [phone] Relation: Daughter  Code Status:  DNR Goals of care: Advanced Directive information Advanced Directives 08/03/2019  Does Patient Have a Medical Advance Directive? Yes  Type of Advance Directive Out of facility DNR (pink MOST or yellow form)  Does patient want to make changes to medical advance directive? No - Patient declined  Copy of [place] in Chart? -  Would patient like information on creating a medical advance directive? -  Pre-existing out of facility DNR order (yellow form or pink MOST form) Pink MOST/Yellow Form most recent copy in chart - Physician notified to receive inpatient order     Chief Complaint  Patient presents with  . Medical Management of Chronic Issues    HPI:  Pt is a 86 y.o. female seen today for medical management of chronic diseases.    Dementia associated with PD: has periods of delusions, can be more anxious in the afternoon. Pleasant for my visit today with no acute complaints. MMSE 13/30 on 07/30/19. Ativan has not been used recently for behavior issues.   Venous insuff: treated for cellulitis last month due to erythema and increased swelling with improvement noted. Continues on compression hose and has prn lasix. Weight down 3 lbs  Wt Readings from Last 3 Encounters:  09/06/19 122 lb 12.8 oz (55.7 kg)  08/19/19  125 lb 9.6 oz (57 kg)  07/09/19 120 lb 6.4 oz (54.6 kg)   No issues with constipation  Appetite fair with no issues chewing or swallowing.   Past Medical History:  Diagnosis Date  . Aortic stenosis, mild   . Constipation, chronic   . Gilbert syndrome   . HTN (hypertension)   . Hyperlipidemia   . Hypothyroidism   . Melanoma ([place])   . Neuropathy   . Osteoporosis   . Parkinson's disease ([place])   . Peripheral neuropathy    Bilateral  . Rheumatic fever    does not take Flu shot    Past Surgical History:  Procedure Laterality Date  . ABDOMINAL HYSTERECTOMY  1963   no BSO; no abnormal PAPs  . COLONOSCOPY  2000   negative; [place]  . [place]  . MELANOMA EXCISION  08/2014    Allergies  Allergen Reactions  . Sulfonamide Derivatives     REACTION:generalized  swelling  . Gabapentin Hypertension and Swelling  . Sulfa Antibiotics Swelling    Outpatient Encounter Medications as of 09/03/2019  Medication Sig  . acetaminophen (TYLENOL) 325 MG tablet Take 325 mg by mouth at bedtime.  Marland Kitchen amLODipine (NORVASC) 5 MG tablet Take 5 mg by mouth daily.  . Calcium Carbonate-Vitamin D (CALCIUM 600/VITAMIN D PO) Take 1 tablet by mouth daily.   . Carbidopa-Levodopa ER (SINEMET CR) 25-100 MG tablet controlled release 2 by mouth at 6 am, 1 by mouth at 11 am, and 1 by mouth at 4 pm  . Cholecalciferol (VITAMIN D) 2000 units  CAPS Take 1 capsule by mouth daily.  . furosemide (LASIX) 20 MG tablet Take 10 mg by mouth as needed.   Marland Kitchen levothyroxine (SYNTHROID, LEVOTHROID) 50 MCG tablet Take one by mouth on Tuesday and Thursday 75 mcg other days  . levothyroxine (SYNTHROID, LEVOTHROID) 75 MCG tablet Take 1 by mouth on Sunday, Monday, Wednesday, Friday, and Saturday  . LORazepam (ATIVAN) 0.5 MG tablet Take 0.5 tablets (0.25 mg total) by mouth 2 (two) times daily as needed for anxiety.  . Magnesium Sulfate, Laxative, (EPSOM SALT) GRAN To soak feet daily at bedtime after taking ted hose off as  needed  . Melatonin 5 MG CAPS Take by mouth.  . Multiple Vitamin (MULTIVITAMIN) capsule Take 1 capsule by mouth daily.    Marland Kitchen omeprazole (PRILOSEC) 20 MG capsule Take 20 mg by mouth daily.   . polyethylene glycol (MIRALAX / GLYCOLAX) 17 g packet Take 17 g by mouth daily.   . potassium chloride (K-DUR) 10 MEQ tablet Take 10 mEq by mouth as needed. Use when taking Lasix  . [DISCONTINUED] acetaminophen (TYLENOL) 325 MG tablet Take 325 mg by mouth every 6 (six) hours as needed for mild pain.   No facility-administered encounter medications on file as of 09/03/2019.    Review of Systems  Constitutional: Negative for activity change, appetite change, chills, diaphoresis, fatigue, fever and unexpected weight change.  HENT: Negative for congestion.   Respiratory: Negative for cough, shortness of breath and wheezing.   Cardiovascular: Positive for leg swelling. Negative for chest pain and palpitations.  Gastrointestinal: Positive for abdominal distention (chronic). Negative for abdominal pain, constipation and diarrhea.  Genitourinary: Negative for difficulty urinating and dysuria.  Musculoskeletal: Positive for gait problem. Negative for arthralgias, back pain, joint swelling and myalgias.  Neurological: Negative for dizziness, tremors, seizures, syncope, facial asymmetry, speech difficulty, weakness, light-headedness, numbness and headaches.       Festinating shuffling gait  Psychiatric/Behavioral: Positive for behavioral problems and confusion. Negative for agitation.    Immunization History  Administered Date(s) Administered  . Moderna SARS-COVID-2 Vaccination 06/29/2019, 07/27/2019  . PPD Test 04/29/2016  . Tdap 08/11/2014   Pertinent  Health Maintenance Due  Topic Date Due  . DEXA SCAN  Completed  . INFLUENZA VACCINE  Discontinued  . PNA vac Low Risk Adult  Discontinued   Fall Risk  01/13/2019 09/16/2018 05/13/2018 05/11/2018 03/18/2018  Falls in the past year? 0 0 1 0 Yes  Number falls  in past yr: 0 0 0 0 1  Comment - - - - -  Injury with Fall? 0 0 0 0 No  Comment - - - - -  Risk Factor Category  - - - - -  Follow up - - - Falls evaluation completed -   Functional Status Survey:    Vitals:   09/06/19 1614  Weight: 122 lb 12.8 oz (55.7 kg)   Body mass index is 21.08 kg/m. Physical Exam Vitals and nursing note reviewed.  Constitutional:      General: She is not in acute distress.    Appearance: She is not diaphoretic.  HENT:     Head: Normocephalic and atraumatic.  Neck:     Vascular: No JVD.  Cardiovascular:     Rate and Rhythm: Normal rate and regular rhythm.     Heart sounds: Murmur present.  Pulmonary:     Effort: Pulmonary effort is normal. No respiratory distress.     Breath sounds: Normal breath sounds. No wheezing.  Abdominal:  General: There is distension (chronic).     Palpations: Abdomen is soft. There is no mass.     Tenderness: There is no abdominal tenderness.  Musculoskeletal:     Right lower leg: Edema (+1) present.     Left lower leg: Edema (+1) present.     Comments: Minimal erythema to bilateral lower ext. Much improved  Skin:    General: Skin is warm and dry.  Neurological:     General: No focal deficit present.     Mental Status: She is alert. Mental status is at baseline.  Psychiatric:        Mood and Affect: Mood normal.     Labs reviewed: Recent Labs    02/01/19 0000 05/11/19 0000  NA 145 143  K 3.9 3.8  CL  --  103  CO2  --  27*  BUN 16 16  CREATININE 0.8 0.7  CALCIUM  --  9.5   Recent Labs    02/01/19 0600 05/11/19 0000  AST 20 20  ALT 21 6*  ALKPHOS 77 55  ALBUMIN  --  4.2   Recent Labs    12/07/18 0000 02/01/19 0000 05/11/19 0000  WBC 5.2 6.1 4.9  HGB 14.3 14.9 14.4  HCT 41 43 42  PLT 172 155 162   Lab Results  Component Value Date   TSH 1.76 05/11/2019   No results found for: HGBA1C Lab Results  Component Value Date   CHOL 199 08/12/2014   HDL 87 08/12/2014   LDLCALC 104 (H)  08/12/2014   LDLDIRECT 105.9 06/15/2013   TRIG 38 08/12/2014   CHOLHDL 2.3 08/12/2014    Significant Diagnostic Results in last 30 days:  No results found.  Assessment/Plan 1. Chronic venous insufficiency Improved Continue compression hose and leg elevation Continue lasix as needed for edema and weight gain  2. Essential hypertension Controlled Continue Norvasc 5 mg qd  3. Chronic constipation Controlled, continue miralax qd   4. Hypothyroidism, unspecified type Lab Results  Component Value Date   TSH 1.76 05/11/2019  Continue alternating dosage of synthroid  5. 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 Seems to be doing fairly well at this time Continue prn ativan at this time  6. Gilbert's syndrome Continue to monitor labs periodically    Family/ staff Communication: nurse  Labs/tests ordered:  NA

## 2019-09-08 DIAGNOSIS — Z9189 Other specified personal risk factors, not elsewhere classified: Secondary | ICD-10-CM | POA: Diagnosis not present

## 2019-09-08 DIAGNOSIS — Z20828 Contact with and (suspected) exposure to other viral communicable diseases: Secondary | ICD-10-CM | POA: Diagnosis not present

## 2019-09-17 ENCOUNTER — Non-Acute Institutional Stay (SKILLED_NURSING_FACILITY): Payer: Medicare Other | Admitting: Adult Health

## 2019-09-17 ENCOUNTER — Encounter: Payer: Self-pay | Admitting: Adult Health

## 2019-09-17 DIAGNOSIS — I10 Essential (primary) hypertension: Secondary | ICD-10-CM

## 2019-09-17 DIAGNOSIS — I872 Venous insufficiency (chronic) (peripheral): Secondary | ICD-10-CM | POA: Diagnosis not present

## 2019-09-17 NOTE — Progress Notes (Signed)
Location:  [facility] of Service:  SNF (31) Provider:   Cindi Carbon, ANP [place] [phone]   Gayland Curry, DO  Patient Care Team: Gayland Curry, DO as PCP - General (Geriatric Medicine) Jola Schmidt, MD as Consulting Physician (Ophthalmology) Tat, Eustace Quail, DO as Consulting Physician (Neurology) Martinique, Peter M, MD as Consulting Physician (Cardiology)  Extended Emergency Contact Information Primary Emergency Contact: [name] Address: [address]          [name] Phone: [phone] Mobile Phone: [phone] Relation: Daughter  Code Status:  DNR Goals of care: Advanced Directive information Advanced Directives 08/03/2019  Does Patient Have a Medical Advance Directive? Yes  Type of Advance Directive Out of facility DNR (pink MOST or yellow form)  Does patient want to make changes to medical advance directive? No - Patient declined  Copy of [place] in Chart? -  Would patient like information on creating a medical advance directive? -  Pre-existing out of facility DNR order (yellow form or pink MOST form) Pink MOST/Yellow Form most recent copy in chart - Physician notified to receive inpatient order     Chief Complaint  Patient presents with  . Acute Visit    edema     HPI:  Pt is a 86 y.o. female seen today for an acute visit for edema. She has a hx of chronic venous insuff and varicose veins along with htn  The nurse asked me to review her edema. She has some mild edema in both legs, with concentration in the calves. She does not have any doe, cp, pnd, erythema in the legs, or weeping. She has gained 1 lb in the past month. The nurse is currently using compression hose. Her bp ranges [id number] systolic.    Past Medical History:  Diagnosis Date  . Aortic stenosis, mild   . Constipation, chronic   . Gilbert syndrome   . HTN (hypertension)   .  Hyperlipidemia   . Hypothyroidism   . Melanoma ([place])   . Neuropathy   . Osteoporosis   . Parkinson's disease ([place])   . Peripheral neuropathy    Bilateral  . Rheumatic fever    does not take Flu shot    Past Surgical History:  Procedure Laterality Date  . ABDOMINAL HYSTERECTOMY  1963   no BSO; no abnormal PAPs  . COLONOSCOPY  2000   negative; [place]  . [place]  . MELANOMA EXCISION  08/2014    Allergies  Allergen Reactions  . Sulfonamide Derivatives     REACTION:generalized  swelling  . Gabapentin Hypertension and Swelling  . Sulfa Antibiotics Swelling    Outpatient Encounter Medications as of 09/17/2019  Medication Sig  . acetaminophen (TYLENOL) 325 MG tablet Take 325 mg by mouth at bedtime.  Marland Kitchen amLODipine (NORVASC) 5 MG tablet Take 5 mg by mouth daily.  . Calcium Carbonate-Vitamin D (CALCIUM 600/VITAMIN D PO) Take 1 tablet by mouth daily.   . Carbidopa-Levodopa ER (SINEMET CR) 25-100 MG tablet controlled release 2 by mouth at 6 am, 1 by mouth at 11 am, and 1 by mouth at 4 pm  . Cholecalciferol (VITAMIN D) 2000 units CAPS Take 1 capsule by mouth daily.  . furosemide (LASIX) 20 MG tablet Take 10 mg by mouth as needed.   Marland Kitchen levothyroxine (SYNTHROID, LEVOTHROID) 50 MCG tablet Take one by mouth on Tuesday and Thursday 75 mcg other days  .  levothyroxine (SYNTHROID, LEVOTHROID) 75 MCG tablet Take 1 by mouth on Sunday, Monday, Wednesday, Friday, and Saturday  . LORazepam (ATIVAN) 0.5 MG tablet Take 0.5 tablets (0.25 mg total) by mouth 2 (two) times daily as needed for anxiety.  . Magnesium Sulfate, Laxative, (EPSOM SALT) GRAN To soak feet daily at bedtime after taking ted hose off as needed  . Melatonin 5 MG CAPS Take by mouth.  . Multiple Vitamin (MULTIVITAMIN) capsule Take 1 capsule by mouth daily.    Marland Kitchen omeprazole (PRILOSEC) 20 MG capsule Take 20 mg by mouth daily.   . polyethylene glycol (MIRALAX / GLYCOLAX) 17 g packet Take 17 g by mouth daily.   . potassium  chloride (K-DUR) 10 MEQ tablet Take 10 mEq by mouth as needed. Use when taking Lasix   No facility-administered encounter medications on file as of 09/17/2019.    Review of Systems  Constitutional: Negative for activity change, appetite change, chills, diaphoresis, fatigue, fever and unexpected weight change.  HENT: Negative for congestion.   Respiratory: Negative for cough, shortness of breath and wheezing.   Cardiovascular: Positive for leg swelling. Negative for chest pain and palpitations.       Varicose veins   Gastrointestinal: Negative for abdominal distention, abdominal pain, constipation and diarrhea.  Genitourinary: Negative for difficulty urinating and dysuria.  Musculoskeletal: Positive for gait problem. Negative for arthralgias, back pain, joint swelling and myalgias.  Neurological: Negative for dizziness, tremors, seizures, syncope, facial asymmetry, speech difficulty, weakness, light-headedness, numbness and headaches.       Slow gait   Psychiatric/Behavioral: Positive for behavioral problems and confusion. Negative for agitation.    Immunization History  Administered Date(s) Administered  . Moderna SARS-COVID-2 Vaccination 06/29/2019, 07/27/2019  . PPD Test 04/29/2016  . Tdap 08/11/2014   Pertinent  Health Maintenance Due  Topic Date Due  . DEXA SCAN  Completed  . INFLUENZA VACCINE  Discontinued  . PNA vac Low Risk Adult  Discontinued   Fall Risk  01/13/2019 09/16/2018 05/13/2018 05/11/2018 03/18/2018  Falls in the past year? 0 0 1 0 Yes  Number falls in past yr: 0 0 0 0 1  Comment - - - - -  Injury with Fall? 0 0 0 0 No  Comment - - - - -  Risk Factor Category  - - - - -  Follow up - - - Falls evaluation completed -   Functional Status Survey:    Vitals:   09/17/19 1335  Weight: 122 lb 12.8 oz (55.7 kg)   Body mass index is 21.08 kg/m.  Wt Readings from Last 3 Encounters:  09/17/19 122 lb 12.8 oz (55.7 kg)  09/06/19 122 lb 12.8 oz (55.7 kg)  08/19/19  125 lb 9.6 oz (57 kg)    Physical Exam Vitals and nursing note reviewed.  Constitutional:      General: She is not in acute distress.    Appearance: She is not diaphoretic.  HENT:     Head: Normocephalic and atraumatic.  Musculoskeletal:     Right lower leg: Edema (+1) present.     Left lower leg: Edema (+1) present.     Comments: Bilateral varicose veins   Skin:    General: Skin is warm and dry.     Findings: No erythema.  Neurological:     General: No focal deficit present.     Mental Status: She is alert. Mental status is at baseline.  Psychiatric:        Mood and Affect: Mood normal.  Labs reviewed: Recent Labs    02/01/19 0000 05/11/19 0000  NA 145 143  K 3.9 3.8  CL  --  103  CO2  --  27*  BUN 16 16  CREATININE 0.8 0.7  CALCIUM  --  9.5   Recent Labs    02/01/19 0600 05/11/19 0000  AST 20 20  ALT 21 6*  ALKPHOS 77 55  ALBUMIN  --  4.2   Recent Labs    12/07/18 0000 02/01/19 0000 05/11/19 0000  WBC 5.2 6.1 4.9  HGB 14.3 14.9 14.4  HCT 41 43 42  PLT 172 155 162   Lab Results  Component Value Date   TSH 1.76 05/11/2019   No results found for: HGBA1C Lab Results  Component Value Date   CHOL 199 08/12/2014   HDL 87 08/12/2014   LDLCALC 104 (H) 08/12/2014   LDLDIRECT 105.9 06/15/2013   TRIG 38 08/12/2014   CHOLHDL 2.3 08/12/2014    Significant Diagnostic Results in last 30 days:  No results found.  Assessment/Plan 1. Chronic venous insufficiency Unchanged edema with concentration in the calf area. Recommend continuing compression hose or wraps depending on the comfort of the patient and response to therapy. Does not need any diuretic at this time.   2. Essential hypertension Controlled, consider reducing norvasc to 2.5 mg at next visit if edema is worse     Family/ staff Communication: discussed with her nurse  Labs/tests ordered:  NA

## 2019-10-01 ENCOUNTER — Encounter: Payer: Self-pay | Admitting: Internal Medicine

## 2019-10-01 ENCOUNTER — Non-Acute Institutional Stay (SKILLED_NURSING_FACILITY): Payer: Medicare Other | Admitting: Adult Health

## 2019-10-01 ENCOUNTER — Encounter: Payer: Self-pay | Admitting: Adult Health

## 2019-10-01 DIAGNOSIS — K5901 Slow transit constipation: Secondary | ICD-10-CM | POA: Diagnosis not present

## 2019-10-01 DIAGNOSIS — F0280 Dementia in other diseases classified elsewhere without behavioral disturbance: Secondary | ICD-10-CM | POA: Diagnosis not present

## 2019-10-01 DIAGNOSIS — F22 Delusional disorders: Secondary | ICD-10-CM | POA: Diagnosis not present

## 2019-10-01 DIAGNOSIS — G20 Parkinson's disease: Secondary | ICD-10-CM | POA: Diagnosis not present

## 2019-10-01 NOTE — Progress Notes (Signed)
Location:  Occup[facility] psychologist of Service:  SNF (31) Provider:   Cindi Carbon, ANP [place] [phone]   Gayland Curry, DO  Patient Care Team: Gayland Curry, DO as PCP - General (Geriatric Medicine) Jola Schmidt, MD as Consulting Physician (Ophthalmology) Tat, Eustace Quail, DO as Consulting Physician (Neurology) Martinique, Peter M, MD as Consulting Physician (Cardiology)  Extended Emergency Contact Information Primary Emergency Contact: [name] Address: [address]          [name] Phone: [phone] Mobile Phone: [phone] Relation: Daughter  Code Status:  DNR Goals of care: Advanced Directive information Advanced Directives 08/03/2019  Does Patient Have a Medical Advance Directive? Yes  Type of Advance Directive Out of facility DNR (pink MOST or yellow form)  Does patient want to make changes to medical advance directive? No - Patient declined  Copy of [place] in Chart? -  Would patient like information on creating a medical advance directive? -  Pre-existing out of facility DNR order (yellow form or pink MOST form) Pink MOST/Yellow Form most recent copy in chart - Physician notified to receive inpatient order     Chief Complaint  Patient presents with  . Medical Management of Chronic Issues    HPI:  Pt is a 86 y.o. female seen today for medical management of chronic diseases.    [name] is a pleasant resident of [facility] memory care unit. She has dementia associated with Parkinson's disease. Unfortunately, she continues to have behavioral issues in the evening hours. The nurse on duty and the nursing notes confirm that during the day she is calm and needs minimal intervention, however, in the evening she becomes paranoid, anxious, and has delusions. One note indicates she was banging her walker at the door saying "call the fireman" and appearing very upset.  We originally ordered Nuplazid for this issue in hopes to help her behaviors without exacerbating her PD but it was not covered by insurance.  She has a very slow gait with a walker.  She also has issues with constipation. She [facility] to the [facility] and so its hard for the nurse to keep up with her BMs as she forgets whether she has gone or not. At this time she is not complaining of feelings of pain or bloating.    Past Medical History:  Diagnosis Date  . Aortic stenosis, mild   . Constipation, chronic   . Gilbert syndrome   . HTN (hypertension)   . Hyperlipidemia   . Hypothyroidism   . Melanoma ([place])   . Neuropathy   . Osteoporosis   . Parkinson's disease ([place])   . Peripheral neuropathy    Bilateral  . Rheumatic fever    does not take Flu shot    Past Surgical History:  Procedure Laterality Date  . ABDOMINAL HYSTERECTOMY  1963   no BSO; no abnormal PAPs  . COLONOSCOPY  2000   negative; [place]  . [place]  . MELANOMA EXCISION  08/2014    Allergies  Allergen Reactions  . Sulfonamide Derivatives     REACTION:generalized  swelling  . Gabapentin Hypertension and Swelling  . Sulfa Antibiotics Swelling    Outpatient Encounter Medications as of 10/01/2019  Medication Sig  . acetaminophen (TYLENOL) 325 MG tablet Take 325 mg by mouth at bedtime.  Marland Kitchen amLODipine (NORVASC) 5 MG tablet Take 5 mg by mouth daily.  . Calcium Carbonate-Vitamin  D (CALCIUM 600/VITAMIN D PO) Take 1 tablet by mouth daily.   . Carbidopa-Levodopa ER (SINEMET CR) 25-100 MG tablet controlled release 2 by mouth at 6 am, 1 by mouth at 11 am, and 1 by mouth at 4 pm  . Cholecalciferol (VITAMIN D) 2000 units CAPS Take 1 capsule by mouth daily.  . furosemide (LASIX) 20 MG tablet Take 10 mg by mouth as needed.   Marland Kitchen levothyroxine (SYNTHROID, LEVOTHROID) 50 MCG tablet Take one by mouth on Tuesday and Thursday 75 mcg other days  . levothyroxine (SYNTHROID, LEVOTHROID) 75 MCG tablet Take 1 by mouth on  Sunday, Monday, Wednesday, Friday, and Saturday  . LORazepam (ATIVAN) 0.5 MG tablet Take 0.5 tablets (0.25 mg total) by mouth 2 (two) times daily as needed for anxiety.  . Magnesium Sulfate, Laxative, (EPSOM SALT) GRAN To soak feet daily at bedtime after taking ted hose off as needed  . Melatonin 5 MG CAPS Take by mouth.  . Multiple Vitamin (MULTIVITAMIN) capsule Take 1 capsule by mouth daily.    Marland Kitchen omeprazole (PRILOSEC) 20 MG capsule Take 20 mg by mouth daily.   . polyethylene glycol (MIRALAX / GLYCOLAX) 17 g packet Take 17 g by mouth daily.   . potassium chloride (K-DUR) 10 MEQ tablet Take 10 mEq by mouth as needed. Use when taking Lasix   No facility-administered encounter medications on file as of 10/01/2019.    Review of Systems  Constitutional: Negative for activity change, appetite change, chills, diaphoresis, fatigue, fever and unexpected weight change.  HENT: Negative for congestion.   Respiratory: Negative for cough, shortness of breath and wheezing.   Cardiovascular: Positive for leg swelling. Negative for chest pain and palpitations.  Gastrointestinal: Positive for abdominal distention (chronic) and constipation. Negative for abdominal pain and diarrhea.  Genitourinary: Negative for difficulty urinating and dysuria.  Musculoskeletal: Positive for gait problem (slow festinating gait). Negative for arthralgias, back pain, joint swelling and myalgias.  Neurological: Negative for dizziness, tremors, seizures, syncope, facial asymmetry, speech difficulty, weakness, light-headedness, numbness and headaches.  Psychiatric/Behavioral: Positive for agitation, behavioral problems and confusion. Negative for decreased concentration, dysphoric mood, hallucinations, self-injury, sleep disturbance and suicidal ideas. The patient is nervous/anxious. The patient is not hyperactive.        Delusions    Immunization History  Administered Date(s) Administered  . Moderna SARS-COVID-2 Vaccination  06/29/2019, 07/27/2019  . PPD Test 04/29/2016  . Tdap 08/11/2014   Pertinent  Health Maintenance Due  Topic Date Due  . DEXA SCAN  Completed  . INFLUENZA VACCINE  Discontinued  . PNA vac Low Risk Adult  Discontinued   Fall Risk  01/13/2019 09/16/2018 05/13/2018 05/11/2018 03/18/2018  Falls in the past year? 0 0 1 0 Yes  Number falls in past yr: 0 0 0 0 1  Comment - - - - -  Injury with Fall? 0 0 0 0 No  Comment - - - - -  Risk Factor Category  - - - - -  Follow up - - - Falls evaluation completed -   Functional Status Survey:    Vitals:   10/01/19 1531  Weight: 124 lb 12.8 oz (56.6 kg)   Body mass index is 21.42 kg/m. Physical Exam Vitals and nursing note reviewed.  Constitutional:      General: She is not in acute distress.    Appearance: She is not diaphoretic.  HENT:     Head: Normocephalic and atraumatic.  Neck:     Vascular: No carotid bruit or JVD.  Cardiovascular:     Rate and Rhythm: Normal rate and regular rhythm.     Heart sounds: Murmur present.  Pulmonary:     Effort: Pulmonary effort is normal. No respiratory distress.     Breath sounds: Normal breath sounds. No wheezing.  Abdominal:     General: Bowel sounds are normal. There is distension (chronic).     Palpations: Abdomen is soft. There is no mass.     Tenderness: There is no abdominal tenderness. There is no guarding.     Hernia: No hernia is present.  Musculoskeletal:     Cervical back: No rigidity or tenderness.     Right lower leg: Edema (+1) present.     Left lower leg: Edema (concentrated on the calf) present.  Lymphadenopathy:     Cervical: No cervical adenopathy.  Skin:    General: Skin is warm and dry.  Neurological:     General: No focal deficit present.     Mental Status: She is alert. Mental status is at baseline.     Comments: Slow gait  Psychiatric:        Mood and Affect: Mood normal.     Labs reviewed: Recent Labs    02/01/19 0000 05/11/19 0000  NA 145 143  K 3.9 3.8   CL  --  103  CO2  --  27*  BUN 16 16  CREATININE 0.8 0.7  CALCIUM  --  9.5   Recent Labs    02/01/19 0600 05/11/19 0000  AST 20 20  ALT 21 6*  ALKPHOS 77 55  ALBUMIN  --  4.2   Recent Labs    12/07/18 0000 02/01/19 0000 05/11/19 0000  WBC 5.2 6.1 4.9  HGB 14.3 14.9 14.4  HCT 41 43 42  PLT 172 155 162   Lab Results  Component Value Date   TSH 1.76 05/11/2019   No results found for: HGBA1C Lab Results  Component Value Date   CHOL 199 08/12/2014   HDL 87 08/12/2014   LDLCALC 104 (H) 08/12/2014   LDLDIRECT 105.9 06/15/2013   TRIG 38 08/12/2014   CHOLHDL 2.3 08/12/2014    Significant Diagnostic Results in last 30 days:  No results found.  Assessment/Plan 1. 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 I will discuss her case with Dr. Mariea Clonts and verify with the DON that Nuplazid is not an option. We are considering starting seroquel low dose for her issues. Will call her daughter and discuss after a plan is made. At this time we will continue the prn ativan for behaviors.   2. Dementia associated with Parkinson's disease ([place]) Moderate with psychosis due to PD as listed above. Remains ambulatory, able to feed herself, and toilet herself.   3. Parkinson's disease ([facility]) Unchanged.  Continue Sinemet 2 at 6 am, 1 at 11 am, and 1 at 4 pm  4. Slow transit constipation Continue Miralax 17 gram s qd     Family/ staff Communication: discussed with her nurse and with Dr. Mariea Clonts  Labs/tests ordered:  NA

## 2019-10-01 NOTE — Telephone Encounter (Signed)
Routed to Dr. Magdalene Molly

## 2019-11-26 ENCOUNTER — Encounter: Payer: Self-pay | Admitting: Adult Health

## 2019-11-26 ENCOUNTER — Non-Acute Institutional Stay (SKILLED_NURSING_FACILITY): Payer: Medicare Other | Admitting: Adult Health

## 2019-11-26 DIAGNOSIS — G20 Parkinson's disease: Secondary | ICD-10-CM | POA: Diagnosis not present

## 2019-11-26 DIAGNOSIS — F22 Delusional disorders: Secondary | ICD-10-CM | POA: Diagnosis not present

## 2019-11-26 DIAGNOSIS — I872 Venous insufficiency (chronic) (peripheral): Secondary | ICD-10-CM | POA: Diagnosis not present

## 2019-11-26 DIAGNOSIS — S81812A Laceration without foreign body, left lower leg, initial encounter: Secondary | ICD-10-CM

## 2019-11-26 DIAGNOSIS — F0280 Dementia in other diseases classified elsewhere without behavioral disturbance: Secondary | ICD-10-CM

## 2019-11-26 DIAGNOSIS — I10 Essential (primary) hypertension: Secondary | ICD-10-CM

## 2019-11-26 NOTE — Progress Notes (Signed)
Location:  Occupational psychologist of Service:  SNF (31) Provider:   Cindi Carbon, ANP [place] [phone]   Gayland Curry, DO  Patient Care Team: Gayland Curry, DO as PCP - General (Geriatric Medicine) Jola Schmidt, MD as Consulting Physician (Ophthalmology) Tat, Eustace Quail, DO as Consulting Physician (Neurology) Martinique, Peter M, MD as Consulting Physician (Cardiology)  Extended Emergency Contact Information Primary Emergency Contact: [name] Address: [address]          [name] Phone: [phone] Mobile Phone: [phone] Relation: Daughter  Code Status:  DNR Goals of care: Advanced Directive information Advanced Directives 08/03/2019  Does Patient Have a Medical Advance Directive? Yes  Type of Advance Directive Out of facility DNR (pink MOST or yellow form)  Does patient want to make changes to medical advance directive? No - Patient declined  Copy of [place] in Chart? -  Would patient like information on creating a medical advance directive? -  Pre-existing out of facility DNR order (yellow form or pink MOST form) Pink MOST/Yellow Form most recent copy in chart - Physician notified to receive inpatient order     Chief Complaint  Patient presents with  . Medical Management of Chronic Issues    HPI:  Pt is a 86 y.o. female seen today for medical management of chronic diseases.    Started on seroquel last month for agitation and paranoia. The nurse reports the behaviors have improved. For my visit Ms. Bonnette is pleasant and has no acute concerns. I reviewed the notes in matrix and there are no recent notes of agitation. She has not used any prn ativan in the past week.   She has a new skin tear to the left anterior shin. Its unclear how it was obtained. It is slightly round and there is a question as to whether this is a venous ulcer vs a skin tear. It is located  to the anterior shin (shaft area).  The area is not draining and is not painful. Ms. Bai has chronic venous insuff which is unchanged and wears compression hose. She denies any sob or pain in her legs at this time.   BP was recorded high at 197/81.  Retake was 130/74.    Past Medical History:  Diagnosis Date  . Aortic stenosis, mild   . Constipation, chronic   . Gilbert syndrome   . HTN (hypertension)   . Hyperlipidemia   . Hypothyroidism   . Melanoma ([place])   . Neuropathy   . Osteoporosis   . Parkinson's disease ([place])   . Peripheral neuropathy    Bilateral  . Rheumatic fever    does not take Flu shot    Past Surgical History:  Procedure Laterality Date  . ABDOMINAL HYSTERECTOMY  1963   no BSO; no abnormal PAPs  . COLONOSCOPY  2000   negative; [place]  . [place]  . MELANOMA EXCISION  08/2014    Allergies  Allergen Reactions  . Sulfonamide Derivatives     REACTION:generalized  swelling  . Gabapentin Hypertension and Swelling  . Sulfa Antibiotics Swelling    Outpatient Encounter Medications as of 11/26/2019  Medication Sig  . LORazepam (ATIVAN) 0.5 MG tablet Take 0.5 tablets (0.25 mg total) by mouth 2 (two) times daily as needed for anxiety.  Marland Kitchen QUEtiapine (SEROQUEL) 25 MG tablet Take 25 mg by mouth daily in the afternoon. At 4 pm  .  acetaminophen (TYLENOL) 325 MG tablet Take 325 mg by mouth at bedtime.  Marland Kitchen amLODipine (NORVASC) 5 MG tablet Take 5 mg by mouth daily.  . Calcium Carbonate-Vitamin D (CALCIUM 600/VITAMIN D PO) Take 1 tablet by mouth daily.   . Carbidopa-Levodopa ER (SINEMET CR) 25-100 MG tablet controlled release 2 by mouth at 6 am, 1 by mouth at 11 am, and 1 by mouth at 4 pm  . Cholecalciferol (VITAMIN D) 2000 units CAPS Take 1 capsule by mouth daily.  . furosemide (LASIX) 20 MG tablet Take 10 mg by mouth as needed.   Marland Kitchen levothyroxine (SYNTHROID, LEVOTHROID) 50 MCG tablet Take one by mouth on Tuesday and Thursday 75 mcg other days  .  levothyroxine (SYNTHROID, LEVOTHROID) 75 MCG tablet Take 1 by mouth on Sunday, Monday, Wednesday, Friday, and Saturday  . Magnesium Sulfate, Laxative, (EPSOM SALT) GRAN To soak feet daily at bedtime after taking ted hose off as needed  . Melatonin 5 MG CAPS Take by mouth.  . Multiple Vitamin (MULTIVITAMIN) capsule Take 1 capsule by mouth daily.    Marland Kitchen omeprazole (PRILOSEC) 20 MG capsule Take 20 mg by mouth daily.   . polyethylene glycol (MIRALAX / GLYCOLAX) 17 g packet Take 17 g by mouth daily.   . potassium chloride (K-DUR) 10 MEQ tablet Take 10 mEq by mouth as needed. Use when taking Lasix   No facility-administered encounter medications on file as of 11/26/2019.    Review of Systems  Constitutional: Negative for activity change, appetite change, chills, diaphoresis, fatigue, fever and unexpected weight change.  HENT: Negative for congestion.   Respiratory: Negative for cough, shortness of breath and wheezing.   Cardiovascular: Positive for leg swelling. Negative for chest pain and palpitations.  Gastrointestinal: Positive for abdominal distention (chronic) and constipation. Negative for abdominal pain and diarrhea.  Genitourinary: Negative for difficulty urinating and dysuria.  Musculoskeletal: Positive for gait problem (slow festinating gait). Negative for arthralgias, back pain, joint swelling and myalgias.  Skin: Positive for wound.  Neurological: Negative for dizziness, seizures, syncope, facial asymmetry, speech difficulty, weakness, light-headedness, numbness and headaches.  Psychiatric/Behavioral: Positive for confusion. Negative for agitation, behavioral problems, decreased concentration, dysphoric mood, hallucinations, self-injury, sleep disturbance and suicidal ideas. The patient is nervous/anxious (improved). The patient is not hyperactive.        Delusions    Immunization History  Administered Date(s) Administered  . Moderna SARS-COVID-2 Vaccination 06/29/2019, 07/27/2019  .  PPD Test 04/29/2016  . Tdap 08/11/2014   Pertinent  Health Maintenance Due  Topic Date Due  . DEXA SCAN  Completed  . INFLUENZA VACCINE  Discontinued  . PNA vac Low Risk Adult  Discontinued   Fall Risk  01/13/2019 09/16/2018 05/13/2018 05/11/2018 03/18/2018  Falls in the past year? 0 0 1 0 Yes  Number falls in past yr: 0 0 0 0 1  Comment - - - - -  Injury with Fall? 0 0 0 0 No  Comment - - - - -  Risk Factor Category  - - - - -  Follow up - - - Falls evaluation completed -   Functional Status Survey:    Vitals:   11/26/19 1339  Weight: 125 lb 3.2 oz (56.8 kg)   Body mass index is 21.49 kg/m. Physical Exam Vitals and nursing note reviewed.  Constitutional:      General: She is not in acute distress.    Appearance: She is not diaphoretic.  HENT:     Head: Normocephalic and atraumatic.  Neck:  Vascular: No carotid bruit or JVD.  Cardiovascular:     Rate and Rhythm: Normal rate and regular rhythm.     Heart sounds: Murmur heard.   Pulmonary:     Effort: Pulmonary effort is normal. No respiratory distress.     Breath sounds: Normal breath sounds. No wheezing.  Abdominal:     General: Bowel sounds are normal. There is distension (chronic).     Palpations: Abdomen is soft. There is no mass.     Tenderness: There is no abdominal tenderness. There is no guarding.     Hernia: No hernia is present.  Musculoskeletal:     Cervical back: No rigidity or tenderness.     Right lower leg: Edema (+1) present.     Left lower leg: Edema (concentrated on the calf) present.  Lymphadenopathy:     Cervical: No cervical adenopathy.  Skin:    General: Skin is warm and dry.     Comments: Left anterior shin with round wound 1cm 50 % pink tissue 50% yellow tissue. No drainage or tenderness. Mild surrounding erythema.   Neurological:     General: No focal deficit present.     Mental Status: She is alert. Mental status is at baseline.     Comments: Slow gait  Psychiatric:        Mood  and Affect: Mood normal.     Labs reviewed: Recent Labs    02/01/19 0000 05/11/19 0000  NA 145 143  K 3.9 3.8  CL  --  103  CO2  --  27*  BUN 16 16  CREATININE 0.8 0.7  CALCIUM  --  9.5   Recent Labs    02/01/19 0600 05/11/19 0000  AST 20 20  ALT 21 6*  ALKPHOS 77 55  ALBUMIN  --  4.2   Recent Labs    12/07/18 0000 02/01/19 0000 05/11/19 0000  WBC 5.2 6.1 4.9  HGB 14.3 14.9 14.4  HCT 41 43 42  PLT 172 155 162   Lab Results  Component Value Date   TSH 1.76 05/11/2019   No results found for: HGBA1C Lab Results  Component Value Date   CHOL 199 08/12/2014   HDL 87 08/12/2014   LDLCALC 104 (H) 08/12/2014   LDLDIRECT 105.9 06/15/2013   TRIG 38 08/12/2014   CHOLHDL 2.3 08/12/2014    Significant Diagnostic Results in last 30 days:  No results found.  Assessment/Plan  1. 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  Continue Seroquel 25 mg at 4 pm and as needed ativan.   2. Noninfected skin tear of left lower extremity, initial encounter Noted to anterior left shin. This area is questionable for a skin tear vs an ulceration due to there round appearance and the fact that a mechanism of injury was not identified. It does not appear to be infected. Staff will provide dressing changes per facility protocol   3. 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   4. Chronic venous insufficiency With associated varicose veins.  Continue as needed lasix.  Continue compression hose and elevation  5. Essential hypertension 1 reading in matrix records was elevated other are WNL. Recommend manual cuff use when higher than normal readings are noted. Continue Norvasc 5 mg qd.  Family/ staff Communication: discussed with her nurse and with Dr. Mariea Clonts  Labs/tests ordered:  NA

## 2020-01-10 DIAGNOSIS — Z9189 Other specified personal risk factors, not elsewhere classified: Secondary | ICD-10-CM | POA: Diagnosis not present

## 2020-01-10 DIAGNOSIS — Z20828 Contact with and (suspected) exposure to other viral communicable diseases: Secondary | ICD-10-CM | POA: Diagnosis not present

## 2020-01-13 ENCOUNTER — Non-Acute Institutional Stay (SKILLED_NURSING_FACILITY): Payer: Medicare Other | Admitting: Adult Health

## 2020-01-13 DIAGNOSIS — F0280 Dementia in other diseases classified elsewhere without behavioral disturbance: Secondary | ICD-10-CM

## 2020-01-13 DIAGNOSIS — M810 Age-related osteoporosis without current pathological fracture: Secondary | ICD-10-CM

## 2020-01-13 DIAGNOSIS — I350 Nonrheumatic aortic (valve) stenosis: Secondary | ICD-10-CM

## 2020-01-13 DIAGNOSIS — I872 Venous insufficiency (chronic) (peripheral): Secondary | ICD-10-CM

## 2020-01-13 DIAGNOSIS — G20 Parkinson's disease: Secondary | ICD-10-CM | POA: Diagnosis not present

## 2020-01-13 DIAGNOSIS — I10 Essential (primary) hypertension: Secondary | ICD-10-CM

## 2020-01-14 ENCOUNTER — Encounter: Payer: Self-pay | Admitting: Adult Health

## 2020-01-14 NOTE — Progress Notes (Signed)
Location:  Occupational psychologist of Service:  SNF (31) Provider:   Cindi Carbon, ANP [place] [phone]   Gayland Curry, DO  Patient Care Team: Gayland Curry, DO as PCP - General (Geriatric Medicine) Jola Schmidt, MD as Consulting Physician (Ophthalmology) Tat, Eustace Quail, DO as Consulting Physician (Neurology) Martinique, Peter M, MD as Consulting Physician (Cardiology)  Extended Emergency Contact Information Primary Emergency Contact: [name] Address: [address]          [name] Phone: [phone] Mobile Phone: [phone] Relation: Daughter  Code Status:  DNR Goals of care: Advanced Directive information Advanced Directives 01/14/2020  Does Patient Have a Medical Advance Directive? Yes  Type of Paramedic of [place];Living will;Out of facility DNR (pink MOST or yellow form)  Does patient want to make changes to medical advance directive? -  Copy of [name] in Chart? Yes - validated most recent copy scanned in chart (See row information)  Would patient like information on creating a medical advance directive? -  Pre-existing out of facility DNR order (yellow form or pink MOST form) -     Chief Complaint  Patient presents with   Medical Management of Chronic Issues    HPI:  Pt is a 86 y.o. female seen today for medical management of chronic diseases.    Natalie Lam has a hx of PD with associated dementia and delusions. She was started on Seroquel earlier this year due to paranoia and delusions. The nurse reports that these symptoms have improved. In review of matrix nursing notes there are no notes to support severe agitation or delusions in the month of August. For my visit today Natalie Lam is sitting on her bed and has no complaints. She remains ambulatory with a walker but has a slow gait and festination at times. There are no signs of  increased rigidity after starting the seroquel.   VS reviewed showed controlled bp   Weight is stable at 125 lbs. She carries edema in her legs which is unchanged.   Noted hx of Gilberts syndrome  Lab Results  Component Value Date   ALT 6 (A) 05/11/2019   AST 20 05/11/2019   ALKPHOS 55 05/11/2019   BILITOT 0.9 12/04/2016   Mild aortic stenosis: currently no symptoms of worsening disease such as sob or syncope.  2 D echo 08/12/14: mild aortic stenosis with peak gradient 23 mm Hgb. EF 55%  Past Medical History:  Diagnosis Date   Aortic stenosis, mild    Constipation, chronic    Gilbert syndrome    HTN (hypertension)    Hyperlipidemia    Hypothyroidism    Melanoma ([place])    Neuropathy    Osteoporosis    Parkinson's disease ([place])    Peripheral neuropathy    Bilateral   Rheumatic fever    does not take Flu shot    Past Surgical History:  Procedure Laterality Date   ABDOMINAL HYSTERECTOMY  1963   no BSO; no abnormal PAPs   COLONOSCOPY  2000   negative; [place]  08/2014    Allergies  Allergen Reactions   Sulfonamide Derivatives     REACTION:generalized  swelling   Gabapentin Hypertension and Swelling   Sulfa Antibiotics Swelling    Outpatient Encounter Medications as of 01/13/2020  Medication Sig   QUEtiapine (SEROQUEL) 25 MG tablet Take 12.5 mg  by mouth at bedtime.   acetaminophen (TYLENOL) 325 MG tablet Take 325 mg by mouth at bedtime. And q 4 prn   amLODipine (NORVASC) 5 MG tablet Take 5 mg by mouth daily.   Calcium Carbonate-Vitamin D (CALCIUM 600/VITAMIN D PO) Take 1 tablet by mouth daily.    Carbidopa-Levodopa ER (SINEMET CR) 25-100 MG tablet controlled release 2 by mouth at 6 am, 1 by mouth at 11 am, and 1 by mouth at 4 pm   Cholecalciferol (VITAMIN D) 2000 units CAPS Take 1 capsule by mouth daily.   furosemide (LASIX) 20 MG tablet Take 10 mg by mouth as needed.    levothyroxine  (SYNTHROID, LEVOTHROID) 50 MCG tablet Take one by mouth on Tuesday and Thursday 75 mcg other days   levothyroxine (SYNTHROID, LEVOTHROID) 75 MCG tablet Take 1 by mouth on Sunday, Monday, Wednesday, Friday, and Saturday   LORazepam (ATIVAN) 0.5 MG tablet Take 0.5 tablets (0.25 mg total) by mouth 2 (two) times daily as needed for anxiety.   Magnesium Sulfate, Laxative, (EPSOM SALT) GRAN To soak feet daily at bedtime after taking ted hose off as needed   Melatonin 5 MG CAPS Take by mouth.   Multiple Vitamin (MULTIVITAMIN) capsule Take 1 capsule by mouth daily.     omeprazole (PRILOSEC) 20 MG capsule Take 20 mg by mouth daily.    polyethylene glycol (MIRALAX / GLYCOLAX) 17 g packet Take 17 g by mouth daily.    potassium chloride (K-DUR) 10 MEQ tablet Take 10 mEq by mouth as needed. Use when taking Lasix   QUEtiapine (SEROQUEL) 25 MG tablet Take 25 mg by mouth daily in the afternoon. At 4 pm   No facility-administered encounter medications on file as of 01/13/2020.    Review of Systems  Constitutional: Negative for activity change, appetite change, chills, diaphoresis, fatigue, fever and unexpected weight change.  HENT: Negative for congestion.   Respiratory: Negative for cough, shortness of breath and wheezing.   Cardiovascular: Positive for leg swelling. Negative for chest pain and palpitations.  Gastrointestinal: Positive for abdominal distention (chronic) and constipation. Negative for abdominal pain and diarrhea.  Genitourinary: Negative for difficulty urinating and dysuria.  Musculoskeletal: Positive for gait problem (slow festinating gait). Negative for arthralgias, back pain, joint swelling and myalgias.  Skin: Negative for wound.  Neurological: Negative for dizziness, seizures, syncope, facial asymmetry, speech difficulty, weakness, light-headedness, numbness and headaches.  Psychiatric/Behavioral: Positive for confusion. Negative for agitation, behavioral problems, decreased  concentration, dysphoric mood, hallucinations, self-injury, sleep disturbance and suicidal ideas. The patient is not nervous/anxious and is not hyperactive.        Delusions    Immunization History  Administered Date(s) Administered   Moderna SARS-COVID-2 Vaccination 06/29/2019, 07/27/2019   PPD Test 04/29/2016   Tdap 08/11/2014   Pertinent  Health Maintenance Due  Topic Date Due   DEXA SCAN  Completed   INFLUENZA VACCINE  Discontinued   PNA vac Low Risk Adult  Discontinued   Fall Risk  01/13/2019 09/16/2018 05/13/2018 05/11/2018 03/18/2018  Falls in the past year? 0 0 1 0 Yes  Number falls in past yr: 0 0 0 0 1  Comment - - - - -  Injury with Fall? 0 0 0 0 No  Comment - - - - -  Risk Factor Category  - - - - -  Follow up - - - Falls evaluation completed -   Functional Status Survey:    Vitals:   01/14/20 1252  Weight: 125 lb (56.7 kg)  Body mass index is 21.46 kg/m. Physical Exam Vitals and nursing note reviewed.  Constitutional:      General: She is not in acute distress.    Appearance: She is not diaphoretic.  HENT:     Head: Normocephalic and atraumatic.  Neck:     Vascular: No carotid bruit or JVD.  Cardiovascular:     Rate and Rhythm: Normal rate and regular rhythm.     Heart sounds: Murmur heard.   Pulmonary:     Effort: Pulmonary effort is normal. No respiratory distress.     Breath sounds: Normal breath sounds. No wheezing.  Abdominal:     General: Bowel sounds are normal. There is distension (chronic).     Palpations: Abdomen is soft. There is no mass.     Tenderness: There is no abdominal tenderness. There is no guarding.     Hernia: No hernia is present.  Musculoskeletal:     Cervical back: No rigidity or tenderness.     Right lower leg: Edema (+1) present.     Left lower leg: Edema (concentrated on the calf) present.  Lymphadenopathy:     Cervical: No cervical adenopathy.  Skin:    General: Skin is warm and dry.  Neurological:      General: No focal deficit present.     Mental Status: She is alert. Mental status is at baseline.     Comments: Slow gait  Psychiatric:        Mood and Affect: Mood normal.     Labs reviewed: Recent Labs    02/01/19 0000 05/11/19 0000  NA 145 143  K 3.9 3.8  CL  --  103  CO2  --  27*  BUN 16 16  CREATININE 0.8 0.7  CALCIUM  --  9.5   Recent Labs    02/01/19 0600 05/11/19 0000  AST 20 20  ALT 21 6*  ALKPHOS 77 55  ALBUMIN  --  4.2   Recent Labs    02/01/19 0000 05/11/19 0000  WBC 6.1 4.9  HGB 14.9 14.4  HCT 43 42  PLT 155 162   Lab Results  Component Value Date   TSH 1.76 05/11/2019   No results found for: HGBA1C Lab Results  Component Value Date   CHOL 199 08/12/2014   HDL 87 08/12/2014   LDLCALC 104 (H) 08/12/2014   LDLDIRECT 105.9 06/15/2013   TRIG 38 08/12/2014   CHOLHDL 2.3 08/12/2014    Significant Diagnostic Results in last 30 days:  No results found.  Assessment/Plan  1. 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  Continue seroquel   2. Aortic valve stenosis, etiology of cardiac valve disease unspecified No current symptoms Continue to monitor  3. Chronic venous insufficiency Continue compression and elevation, as well as prn lasix   4. Essential hypertension Controlled  Continue Norvasc 5 mg qd   5. Gilbert's syndrome Monitor LFTs annually  6. Senile osteoporosis Continue Ca with Vit D supplementation Given her progressive dementia no additional testing is warranted   Labs/tests ordered:  NA

## 2020-01-17 DIAGNOSIS — Z9189 Other specified personal risk factors, not elsewhere classified: Secondary | ICD-10-CM | POA: Diagnosis not present

## 2020-01-17 DIAGNOSIS — Z20828 Contact with and (suspected) exposure to other viral communicable diseases: Secondary | ICD-10-CM | POA: Diagnosis not present

## 2020-02-04 DIAGNOSIS — Z20828 Contact with and (suspected) exposure to other viral communicable diseases: Secondary | ICD-10-CM | POA: Diagnosis not present

## 2020-02-04 DIAGNOSIS — Z9189 Other specified personal risk factors, not elsewhere classified: Secondary | ICD-10-CM | POA: Diagnosis not present

## 2020-02-10 DIAGNOSIS — Z20828 Contact with and (suspected) exposure to other viral communicable diseases: Secondary | ICD-10-CM | POA: Diagnosis not present

## 2020-02-10 DIAGNOSIS — Z9189 Other specified personal risk factors, not elsewhere classified: Secondary | ICD-10-CM | POA: Diagnosis not present

## 2020-02-14 DIAGNOSIS — Z20828 Contact with and (suspected) exposure to other viral communicable diseases: Secondary | ICD-10-CM | POA: Diagnosis not present

## 2020-02-14 DIAGNOSIS — Z9189 Other specified personal risk factors, not elsewhere classified: Secondary | ICD-10-CM | POA: Diagnosis not present

## 2020-02-21 DIAGNOSIS — Z9189 Other specified personal risk factors, not elsewhere classified: Secondary | ICD-10-CM | POA: Diagnosis not present

## 2020-02-21 DIAGNOSIS — Z20828 Contact with and (suspected) exposure to other viral communicable diseases: Secondary | ICD-10-CM | POA: Diagnosis not present

## 2020-03-02 ENCOUNTER — Non-Acute Institutional Stay (SKILLED_NURSING_FACILITY): Payer: Medicare Other | Admitting: Adult Health

## 2020-03-02 DIAGNOSIS — I10 Essential (primary) hypertension: Secondary | ICD-10-CM | POA: Diagnosis not present

## 2020-03-02 DIAGNOSIS — R2681 Unsteadiness on feet: Secondary | ICD-10-CM

## 2020-03-02 DIAGNOSIS — I872 Venous insufficiency (chronic) (peripheral): Secondary | ICD-10-CM | POA: Diagnosis not present

## 2020-03-02 DIAGNOSIS — K219 Gastro-esophageal reflux disease without esophagitis: Secondary | ICD-10-CM | POA: Diagnosis not present

## 2020-03-02 DIAGNOSIS — G20 Parkinson's disease: Secondary | ICD-10-CM | POA: Diagnosis not present

## 2020-03-02 DIAGNOSIS — I350 Nonrheumatic aortic (valve) stenosis: Secondary | ICD-10-CM | POA: Diagnosis not present

## 2020-03-02 DIAGNOSIS — F0280 Dementia in other diseases classified elsewhere without behavioral disturbance: Secondary | ICD-10-CM | POA: Diagnosis not present

## 2020-03-02 DIAGNOSIS — M810 Age-related osteoporosis without current pathological fracture: Secondary | ICD-10-CM

## 2020-03-02 DIAGNOSIS — F22 Delusional disorders: Secondary | ICD-10-CM | POA: Diagnosis not present

## 2020-03-02 DIAGNOSIS — E039 Hypothyroidism, unspecified: Secondary | ICD-10-CM | POA: Diagnosis not present

## 2020-03-02 DIAGNOSIS — K5909 Other constipation: Secondary | ICD-10-CM | POA: Diagnosis not present

## 2020-03-03 ENCOUNTER — Encounter: Payer: Self-pay | Admitting: Adult Health

## 2020-03-03 NOTE — Progress Notes (Signed)
Provider:   Cindi Carbon, ANP [place] [phone]  Location: [place]:  SNF (31)   PCP: Gayland Curry, DO Patient Care Team: Gayland Curry, DO as PCP - General (Geriatric Medicine) Jola Schmidt, MD as Consulting Physician (Ophthalmology) Tat, Eustace Quail, DO as Consulting Physician (Neurology) Martinique, Peter M, MD as Consulting Physician (Cardiology)  Extended Emergency Contact Information Primary Emergency Contact: [name] Address: [address]          [place] Phone: [phone] Mobile Phone: [phone] Relation: Daughter  Code Status: DNR Goals of Care: Advanced Directive information Advanced Directives 03/03/2020  Does Patient Have a Medical Advance Directive? Yes  Type of Paramedic of [place];Living will;Out of facility DNR (pink MOST or yellow form)  Does patient want to make changes to medical advance directive? -  Copy of [place] in Chart? Yes - validated most recent copy scanned in chart (See row information)  Would patient like information on creating a medical advance directive? -  Pre-existing out of facility DNR order (yellow form or pink MOST form) -     Chief Complaint  Patient presents with  . Annual Exam    HPI: Patient is a 86 y.o. female seen today for an annual comprehensive examination. Ms. Erich has PD with dementia and resides in the memory care unit. She remains ambulatory with a walker but uses the [facility] for long distances. She experienced paranoia and anxiety earlier this year and was started on Seroquel. These issues tended to occur in the afternoon/evening hrs. The nurse reports the seroquel has helped as she is more calm in the afternoon and has not needed prn ativan. There are no other issues reported regarding her care. She has aged out of screening measures such as mammograms and  colonoscopies.   Past Medical History:  Diagnosis Date  . Aortic stenosis, mild   . Constipation, chronic   . Gilbert syndrome   . HTN (hypertension)   . Hyperlipidemia   . Hypothyroidism   . Melanoma ([place])   . Neuropathy   . Osteoporosis   . Parkinson's disease ([place])   . Peripheral neuropathy    Bilateral  . Rheumatic fever    does not take Flu shot    Past Surgical History:  Procedure Laterality Date  . ABDOMINAL HYSTERECTOMY  1963   no BSO; no abnormal PAPs  . COLONOSCOPY  2000   negative; [place]  . [place]  . MELANOMA EXCISION  08/2014    reports that she has never smoked. She has never used smokeless tobacco. She reports that she does not drink alcohol and does not use drugs. Social History   Socioeconomic History  . Marital status: Widowed    Spouse name: Not on file  . Number of children: Not on file  . Years of education: Not on file  . Highest education level: Not on file  Occupational History  . Occupation: retired own [employer]  . Smoking status: Never Smoker  . Smokeless tobacco: Never Used  Vaping Use  . Vaping Use: Never used  Substance and Sexual Activity  . Alcohol use: No  . Drug use: No  . Sexual activity: Not on file  Other Topics Concern  . Not on file  Social History Narrative   Lives at [facility] [place] 01/05/2015   Widowed   Never  smoked   Exercise walking   Alcohol none   POA/Living Will      Social Determinants of Health   Financial Resource Strain:   . Difficulty of Paying Living Expenses: Not on file  Food Insecurity:   . Worried About Charity fundraiser in the Last Year: Not on file  . Ran Out of Food in the Last Year: Not on file  Transportation Needs:   . Lack of Transportation (Medical): Not on file  . Lack of Transportation (Non-Medical): Not on file  Physical Activity:   . Days of Exercise per Week: Not on file  . Minutes of Exercise per Session: Not on file  Stress:    . Feeling of Stress : Not on file  Social Connections:   . Frequency of Communication with Friends and Family: Not on file  . Frequency of Social Gatherings with Friends and Family: Not on file  . Attends Religious Services: Not on file  . Active Member of Clubs or Organizations: Not on file  . Attends Archivist Meetings: Not on file  . Marital Status: Not on file   Family History  Problem Relation Age of Onset  . Dementia Mother        Alzheimers  . Stroke Mother        ? 64  . Cancer Son        multiple myeloma  . Pancreatic cancer Son   . Heart disease Brother   . Lung cancer Maternal Uncle        smoker  . Heart attack Neg Hx   . Diabetes Neg Hx   . Hearing loss Neg Hx     Pertinent  Health Maintenance Due  Topic Date Due  . DEXA SCAN  Completed  . INFLUENZA VACCINE  Discontinued  . PNA vac Low Risk Adult  Discontinued   Fall Risk  03/03/2020 01/13/2019 09/16/2018 05/13/2018 05/11/2018  Falls in the past year? 1 0 0 1 0  Number falls in past yr: 0 0 0 0 0  Comment - - - - -  Injury with Fall? 1 0 0 0 0  Comment - - - - -  Risk Factor Category  - - - - -  Risk for fall due to : History of fall(s);Impaired balance/gait - - - -  Follow up Falls evaluation completed - - - Falls evaluation completed   Depression screen [facility] 2/9 01/13/2019 09/16/2018 03/18/2018 02/18/2018 02/04/2018  Decreased Interest 0 0 0 0 0  Down, Depressed, Hopeless 0 0 0 0 0  PHQ - 2 Score 0 0 0 0 0  Altered sleeping - - - - -  Tired, decreased energy - - - - -  Change in appetite - - - - -  Feeling bad or failure about yourself  - - - - -  Trouble concentrating - - - - -  Moving slowly or fidgety/restless - - - - -  Suicidal thoughts - - - - -  PHQ-9 Score - - - - -    Functional Status Survey: Is the patient deaf or have difficulty hearing?: Yes Does the patient have difficulty seeing, even when wearing glasses/contacts?: Yes Does the patient have difficulty concentrating,  remembering, or making decisions?: Yes Does the patient have difficulty walking or climbing stairs?: Yes Does the patient have difficulty dressing or bathing?: Yes Does the patient have difficulty doing errands alone such as visiting a doctor's office or shopping?: Yes  Allergies  Allergen Reactions  . Sulfonamide Derivatives     REACTION:generalized  swelling  . Gabapentin Hypertension and Swelling  . Sulfa Antibiotics Swelling    Outpatient Encounter Medications as of 03/02/2020  Medication Sig  . acetaminophen (TYLENOL) 325 MG tablet Take 325 mg by mouth at bedtime. And q 4 prn  . amLODipine (NORVASC) 5 MG tablet Take 5 mg by mouth daily.  . Calcium Carbonate-Vitamin D (CALCIUM 600/VITAMIN D PO) Take 1 tablet by mouth daily.   . Carbidopa-Levodopa ER (SINEMET CR) 25-100 MG tablet controlled release 2 by mouth at 6 am, 1 by mouth at 11 am, and 1 by mouth at 4 pm  . Cholecalciferol (VITAMIN D) 2000 units CAPS Take 1 capsule by mouth daily.  . furosemide (LASIX) 20 MG tablet Take 10 mg by mouth as needed.   Marland Kitchen levothyroxine (SYNTHROID, LEVOTHROID) 50 MCG tablet Take one by mouth on Tuesday and Thursday 75 mcg other days  . levothyroxine (SYNTHROID, LEVOTHROID) 75 MCG tablet Take 1 by mouth on Sunday, Monday, Wednesday, Friday, and Saturday  . LORazepam (ATIVAN) 0.5 MG tablet Take 0.5 tablets (0.25 mg total) by mouth 2 (two) times daily as needed for anxiety.  . Magnesium Sulfate, Laxative, (EPSOM SALT) GRAN To soak feet daily at bedtime after taking ted hose off as needed  . Melatonin 5 MG CAPS Take by mouth.  . Multiple Vitamin (MULTIVITAMIN) capsule Take 1 capsule by mouth daily.    Marland Kitchen omeprazole (PRILOSEC) 20 MG capsule Take 20 mg by mouth daily.   . polyethylene glycol (MIRALAX / GLYCOLAX) 17 g packet Take 17 g by mouth daily.   . potassium chloride (K-DUR) 10 MEQ tablet Take 10 mEq by mouth as needed. Use when taking Lasix  . QUEtiapine (SEROQUEL) 25 MG tablet Take 25 mg by mouth  daily in the afternoon. At 4 pm  . QUEtiapine (SEROQUEL) 25 MG tablet Take 12.5 mg by mouth at bedtime.   No facility-administered encounter medications on file as of 03/02/2020.    Review of Systems  Constitutional: Negative for activity change, appetite change, chills, diaphoresis, fatigue, fever and unexpected weight change.  HENT: Negative for congestion, sore throat and trouble swallowing.   Respiratory: Negative for cough, shortness of breath and wheezing.   Cardiovascular: Positive for leg swelling. Negative for chest pain and palpitations.  Gastrointestinal: Positive for constipation. Negative for abdominal distention, abdominal pain and diarrhea.  Genitourinary: Negative for difficulty urinating and dysuria.  Musculoskeletal: Positive for gait problem. Negative for arthralgias, back pain, joint swelling and myalgias.  Neurological: Negative for dizziness, tremors, seizures, syncope, facial asymmetry, speech difficulty, weakness, light-headedness, numbness and headaches.  Psychiatric/Behavioral: Positive for confusion. Negative for agitation, behavioral problems and sleep disturbance. The patient is not nervous/anxious.     Vitals:   03/03/20 1534  Weight: 126 lb 6.4 oz (57.3 kg)   Body mass index is 21.7 kg/m. Physical Exam Vitals and nursing note reviewed.  Constitutional:      General: She is not in acute distress.    Appearance: She is not diaphoretic.  HENT:     Head: Normocephalic and atraumatic.     Right Ear: Tympanic membrane and ear canal normal.     Left Ear: Tympanic membrane and ear canal normal.     Nose: Nose normal. No congestion.     Mouth/Throat:     Mouth: Mucous membranes are moist.     Pharynx: Oropharynx is clear.  Eyes:     Conjunctiva/sclera: Conjunctivae normal.  Pupils: Pupils are equal, round, and reactive to light.  Neck:     Vascular: No JVD.  Cardiovascular:     Rate and Rhythm: Normal rate and regular rhythm.     Heart sounds:  Murmur heard.   Pulmonary:     Effort: Pulmonary effort is normal. No respiratory distress.     Breath sounds: Normal breath sounds. No wheezing.  Abdominal:     General: There is distension (mild chronic).     Palpations: Abdomen is soft.     Tenderness: There is no abdominal tenderness. There is no right CVA tenderness, left CVA tenderness or guarding.  Musculoskeletal:     Right lower leg: Edema (+1) present.     Left lower leg: Edema (+1) present.  Skin:    General: Skin is warm and dry.  Neurological:     General: No focal deficit present.     Mental Status: She is alert. Mental status is at baseline.  Psychiatric:        Mood and Affect: Mood normal.     Labs reviewed: Basic Metabolic Panel: Recent Labs    05/11/19 0000  NA 143  K 3.8  CL 103  CO2 27*  BUN 16  CREATININE 0.7  CALCIUM 9.5   Liver Function Tests: Recent Labs    05/11/19 0000  AST 20  ALT 6*  ALKPHOS 55  ALBUMIN 4.2   No results for input(s): LIPASE, AMYLASE in the last 8760 hours. No results for input(s): AMMONIA in the last 8760 hours. CBC: Recent Labs    05/11/19 0000  WBC 4.9  HGB 14.4  HCT 42  PLT 162   Cardiac Enzymes: No results for input(s): CKTOTAL, CKMB, CKMBINDEX, TROPONINI in the last 8760 hours. BNP: Invalid input(s): POCBNP No results found for: HGBA1C Lab Results  Component Value Date   TSH 1.76 05/11/2019   Lab Results  Component Value Date   [id number] 329 08/11/2014   No results found for: FOLATE No results found for: IRON, TIBC, FERRITIN  Imaging and Procedures obtained recently: No results found.  Assessment/Plan 1. Dementia associated with Parkinson's disease ([place]) MMSE scores declined over the past year from 21/30 to 18/30 on 01/19/20. She continues to be pleasant, communicative, and ambulatory. Continue memory care supprt.   2. Parkinson's disease ([place]) Remains at baseline on sinemet  3. Hypothyroidism, unspecified type Lab Results  Component  Value Date   TSH 1.76 05/11/2019  Continue Synthroid 75 mcg Sun, M, W, F, Sat and 50 mcg T and Thurs   4. Gastroesophageal reflux disease, unspecified whether esophagitis present Continue Prilosec 20 mg qd   5. Chronic constipation Controlled with Miralax 17 grams qd   6. Primary hypertension Controlled  Continue Norvasc 5 mg qd   7. Chronic venous insufficiency Unchanged. Continues compression hose, leg elevation and as needed lasix.   8. Aortic valve stenosis, etiology of cardiac valve disease unspecified 2 D echo 08/12/14: mild aortic stenosis with peak gradient 23 mm Hgb. EF 55% No current symptoms. Due to her age and underlying dementia/PD would not pursue further treatment   9. Senile osteoporosis Prior hx of compression fracture. Prior hx of bisphosphonate use. Currently on Ca with Vit D supplementation. Continue current regimen  10. Gilbert's syndrome Lab Results  Component Value Date   ALT 6 (A) 05/11/2019   AST 20 05/11/2019   ALKPHOS 55 05/11/2019   BILITOT 0.9 12/04/2016   Continue to monitor   11. Psychotic paranoia ([place]) Improved with  seroquel with no s/e  12. Unsteady gait Due to PD Continue walker use with all transfers and ambulation      Labs/tests ordered: Due Dec

## 2020-04-05 DIAGNOSIS — Z23 Encounter for immunization: Secondary | ICD-10-CM | POA: Diagnosis not present

## 2020-04-06 ENCOUNTER — Encounter: Payer: Self-pay | Admitting: Adult Health

## 2020-04-06 ENCOUNTER — Non-Acute Institutional Stay (SKILLED_NURSING_FACILITY): Payer: Medicare Other | Admitting: Adult Health

## 2020-04-06 DIAGNOSIS — M810 Age-related osteoporosis without current pathological fracture: Secondary | ICD-10-CM | POA: Diagnosis not present

## 2020-04-06 DIAGNOSIS — F0280 Dementia in other diseases classified elsewhere without behavioral disturbance: Secondary | ICD-10-CM

## 2020-04-06 DIAGNOSIS — G20 Parkinson's disease: Secondary | ICD-10-CM

## 2020-04-06 DIAGNOSIS — R2681 Unsteadiness on feet: Secondary | ICD-10-CM | POA: Diagnosis not present

## 2020-04-06 DIAGNOSIS — K5909 Other constipation: Secondary | ICD-10-CM | POA: Diagnosis not present

## 2020-04-06 DIAGNOSIS — E039 Hypothyroidism, unspecified: Secondary | ICD-10-CM | POA: Diagnosis not present

## 2020-04-06 DIAGNOSIS — I10 Essential (primary) hypertension: Secondary | ICD-10-CM | POA: Diagnosis not present

## 2020-04-06 DIAGNOSIS — I872 Venous insufficiency (chronic) (peripheral): Secondary | ICD-10-CM | POA: Diagnosis not present

## 2020-04-06 NOTE — Progress Notes (Signed)
Provider:   Cindi Carbon, ANP [place] [phone]  Location: [place]:  SNF (31)   PCP: Gayland Curry, DO Patient Care Team: Gayland Curry, DO as PCP - General (Geriatric Medicine) Jola Schmidt, MD as Consulting Physician (Ophthalmology) Tat, Eustace Quail, DO as Consulting Physician (Neurology) Martinique, Peter M, MD as Consulting Physician (Cardiology)  Extended Emergency Contact Information Primary Emergency Contact: [name] Address: [address]          [place] Phone: [phone] Mobile Phone: [phone] Relation: Daughter  Code Status: DNR Goals of Care: Advanced Directive information Advanced Directives 03/03/2020  Does Patient Have a Medical Advance Directive? Yes  Type of Paramedic of [place];Living will;Out of facility DNR (pink MOST or yellow form)  Does patient want to make changes to medical advance directive? -  Copy of [place] in Chart? Yes - validated most recent copy scanned in chart (See row information)  Would patient like information on creating a medical advance directive? -  Pre-existing out of facility DNR order (yellow form or pink MOST form) -     Chief Complaint  Patient presents with   Medical Management of Chronic Issues    HPI: Patient is a 86 y.o. female seen today for management of chronic issues.   She remains ambulatory with slow festinating gait and bradykinesia. Uses a WC for longer distances.  Has a hx of psychosis with delusions due to dementia but this is well controlled with seroquel. Her nurse reports periods of agitation in the afternoon are few.   Bowels are moving well on current regimen  Has chronic edema in both legs and wears hose. Weight is unchanged with no sob, pnd, doe, etc Has murmur with mild aortic stenosis   No issues with chewing or swallowing. Tolerating  regular diet well with no reports of coughing or choking.    Past Medical History:  Diagnosis Date   Aortic stenosis, mild    Constipation, chronic    Gilbert syndrome    HTN (hypertension)    Hyperlipidemia    Hypothyroidism    Melanoma ([place])    Neuropathy    Osteoporosis    Parkinson's disease ([place])    Peripheral neuropathy    Bilateral   Rheumatic fever    does not take Flu shot    Past Surgical History:  Procedure Laterality Date   ABDOMINAL HYSTERECTOMY  1963   no BSO; no abnormal PAPs   COLONOSCOPY  2000   negative; [place]  08/2014    reports that she has never smoked. She has never used smokeless tobacco. She reports that she does not drink alcohol and does not use drugs. Social History   Socioeconomic History   Marital status: Widowed    Spouse name: Not on file   Number of children: Not on file   Years of education: Not on file   Highest education level: Not on file  Occupational History   Occupation: retired own [employer]  Tobacco Use   Smoking status: Never Smoker   Smokeless tobacco: Never Used  Scientific laboratory technician Use: Never used  Substance and Sexual Activity   Alcohol use: No   Drug use: No   Sexual activity: Not on file  Other Topics Concern   Not on file  Social History Narrative  Lives at [facility] 01/05/2015   Widowed   Never smoked   Exercise walking   Alcohol none   POA/Living Will      Social Determinants of Health   Financial Resource Strain:    Difficulty of Paying Living Expenses: Not on file  Food Insecurity:    Worried About Charity fundraiser in the Last Year: Not on file   [employer] of Food in the Last Year: Not on file  Transportation Needs:    Lack of Transportation (Medical): Not on file   Lack of Transportation (Non-Medical): Not on file  Physical Activity:    Days of Exercise per Week: Not on file   Minutes  of Exercise per Session: Not on file  Stress:    Feeling of Stress : Not on file  Social Connections:    Frequency of Communication with Friends and Family: Not on file   Frequency of Social Gatherings with Friends and Family: Not on file   Attends Religious Services: Not on file   Active Member of Clubs or Organizations: Not on file   Attends Archivist Meetings: Not on file   Marital Status: Not on file   Family History  Problem Relation Age of Onset   Dementia Mother        Alzheimers   Stroke Mother        ? 19   Cancer Son        multiple myeloma   Pancreatic cancer Son    Heart disease Brother    Lung cancer Maternal Uncle        smoker   Heart attack Neg Hx    Diabetes Neg Hx    Hearing loss Neg Hx     Pertinent  Health Maintenance Due  Topic Date Due   DEXA SCAN  Completed   INFLUENZA VACCINE  Discontinued   PNA vac Low Risk Adult  Discontinued   Fall Risk  03/03/2020 01/13/2019 09/16/2018 05/13/2018 05/11/2018  Falls in the past year? 1 0 0 1 0  Number falls in past yr: 0 0 0 0 0  Comment - - - - -  Injury with Fall? 1 0 0 0 0  Comment - - - - -  Risk Factor Category  - - - - -  Risk for fall due to : History of fall(s);Impaired balance/gait - - - -  Follow up Falls evaluation completed - - - Falls evaluation completed   Depression screen [facility] 2/9 01/13/2019 09/16/2018 03/18/2018 02/18/2018 02/04/2018  Decreased Interest 0 0 0 0 0  Down, Depressed, Hopeless 0 0 0 0 0  PHQ - 2 Score 0 0 0 0 0  Altered sleeping - - - - -  Tired, decreased energy - - - - -  Change in appetite - - - - -  Feeling bad or failure about yourself  - - - - -  Trouble concentrating - - - - -  Moving slowly or fidgety/restless - - - - -  Suicidal thoughts - - - - -  PHQ-9 Score - - - - -    Functional Status Survey:    Allergies  Allergen Reactions   Sulfonamide Derivatives     REACTION:generalized  swelling   Gabapentin Hypertension and Swelling     Sulfa Antibiotics Swelling    Outpatient Encounter Medications as of 04/06/2020  Medication Sig   acetaminophen (TYLENOL) 325 MG tablet Take 325 mg by mouth at bedtime. And  q 4 prn   amLODipine (NORVASC) 5 MG tablet Take 5 mg by mouth daily.   Calcium Carbonate-Vitamin D (CALCIUM 600/VITAMIN D PO) Take 1 tablet by mouth daily.    Carbidopa-Levodopa ER (SINEMET CR) 25-100 MG tablet controlled release 2 by mouth at 6 am, 1 by mouth at 11 am, and 1 by mouth at 4 pm   Cholecalciferol (VITAMIN D) 2000 units CAPS Take 1 capsule by mouth daily.   furosemide (LASIX) 20 MG tablet Take 10 mg by mouth as needed.    levothyroxine (SYNTHROID, LEVOTHROID) 50 MCG tablet Take one by mouth on Tuesday and Thursday 75 mcg other days   levothyroxine (SYNTHROID, LEVOTHROID) 75 MCG tablet Take 1 by mouth on Sunday, Monday, Wednesday, Friday, and Saturday   LORazepam (ATIVAN) 0.5 MG tablet Take 0.5 tablets (0.25 mg total) by mouth 2 (two) times daily as needed for anxiety.   Magnesium Sulfate, Laxative, (EPSOM SALT) GRAN To soak feet daily at bedtime after taking ted hose off as needed   Melatonin 5 MG CAPS Take by mouth.   Multiple Vitamin (MULTIVITAMIN) capsule Take 1 capsule by mouth daily.     omeprazole (PRILOSEC) 20 MG capsule Take 20 mg by mouth daily.    polyethylene glycol (MIRALAX / GLYCOLAX) 17 g packet Take 17 g by mouth daily.    potassium chloride (K-DUR) 10 MEQ tablet Take 10 mEq by mouth as needed. Use when taking Lasix   QUEtiapine (SEROQUEL) 25 MG tablet Take 25 mg by mouth daily in the afternoon. At 4 pm   QUEtiapine (SEROQUEL) 25 MG tablet Take 12.5 mg by mouth at bedtime.   No facility-administered encounter medications on file as of 04/06/2020.    Review of Systems  Constitutional: Negative for activity change, appetite change, chills, diaphoresis, fatigue, fever and unexpected weight change.  HENT: Negative for congestion, sore throat and trouble swallowing.    Respiratory: Negative for cough, shortness of breath and wheezing.   Cardiovascular: Positive for leg swelling. Negative for chest pain and palpitations.  Gastrointestinal: Positive for constipation. Negative for abdominal distention, abdominal pain and diarrhea.  Genitourinary: Negative for difficulty urinating and dysuria.  Musculoskeletal: Positive for gait problem. Negative for arthralgias, back pain, joint swelling and myalgias.  Neurological: Negative for dizziness, tremors, seizures, syncope, facial asymmetry, speech difficulty, weakness, light-headedness, numbness and headaches.  Psychiatric/Behavioral: Positive for confusion. Negative for agitation, behavioral problems and sleep disturbance. The patient is not nervous/anxious.     Vitals:   04/06/20 1051  Weight: 126 lb 3.2 oz (57.2 kg)   Body mass index is 21.66 kg/m. Physical Exam Vitals and nursing note reviewed.  Constitutional:      General: She is not in acute distress.    Appearance: She is not diaphoretic.  HENT:     Head: Normocephalic and atraumatic.     Right Ear: Tympanic membrane and ear canal normal.     Left Ear: Tympanic membrane and ear canal normal.     Nose: Nose normal. No congestion.     Mouth/Throat:     Mouth: Mucous membranes are moist.     Pharynx: Oropharynx is clear.  Eyes:     Conjunctiva/sclera: Conjunctivae normal.     Pupils: Pupils are equal, round, and reactive to light.  Neck:     Vascular: No JVD.  Cardiovascular:     Rate and Rhythm: Normal rate and regular rhythm.     Heart sounds: Murmur heard.   Pulmonary:     Effort: Pulmonary  effort is normal. No respiratory distress.     Breath sounds: Normal breath sounds. No wheezing.  Abdominal:     General: There is distension (mild chronic).     Palpations: Abdomen is soft.     Tenderness: There is no abdominal tenderness. There is no right CVA tenderness, left CVA tenderness or guarding.  Musculoskeletal:     Right lower leg:  Edema (+1) present.     Left lower leg: Edema (+1) present.  Skin:    General: Skin is warm and dry.  Neurological:     General: No focal deficit present.     Mental Status: She is alert. Mental status is at baseline.  Psychiatric:        Mood and Affect: Mood normal.     Labs reviewed: Basic Metabolic Panel: Recent Labs    05/11/19 0000  NA 143  K 3.8  CL 103  CO2 27*  BUN 16  CREATININE 0.7  CALCIUM 9.5   Liver Function Tests: Recent Labs    05/11/19 0000  AST 20  ALT 6*  ALKPHOS 55  ALBUMIN 4.2   No results for input(s): LIPASE, AMYLASE in the last 8760 hours. No results for input(s): AMMONIA in the last 8760 hours. CBC: Recent Labs    05/11/19 0000  WBC 4.9  HGB 14.4  HCT 42  PLT 162   Cardiac Enzymes: No results for input(s): CKTOTAL, CKMB, CKMBINDEX, TROPONINI in the last 8760 hours. BNP: Invalid input(s): POCBNP No results found for: HGBA1C Lab Results  Component Value Date   TSH 1.76 05/11/2019   Lab Results  Component Value Date   [id number] 655 08/11/2014   No results found for: FOLATE No results found for: IRON, TIBC, FERRITIN  Imaging and Procedures obtained recently: No results found.  Assessment/Plan  1. Acquired hypothyroidism Lab Results  Component Value Date   TSH 1.76 05/11/2019  Continue Synthroid at current dosing and check TSH annually  2. Chronic constipation Controlled with current regimen  3. Chronic venous insufficiency Continue compression hose on in the am and off in the pm Monitoring for skin issues in the memory care unit  4. Primary hypertension Controlled Continue Norvasc 5 mg qd   5. Dementia associated with Parkinson's disease ([place]) Moderate and progressing over the past year Agitation and psychosis well controlled with seroquel, would not taper at this time.  Continue supportive care in the memory care setting  6. Parkinson's disease ([place]) Continue Sinemet at currently dosing   7. Unsteady  gait Needs walker with short distances and WC for longer distances.  Reminders for safety due to underlying dementia, fall prec in place.   8. Senile osteoporosis With prior hx of compression fx Continue Ca with Vit D No additional testing due to memory loss.

## 2020-04-08 ENCOUNTER — Encounter: Payer: Self-pay | Admitting: Internal Medicine

## 2020-05-17 ENCOUNTER — Encounter: Payer: Self-pay | Admitting: Internal Medicine

## 2020-07-03 ENCOUNTER — Encounter: Payer: Self-pay | Admitting: Internal Medicine

## 2020-07-04 ENCOUNTER — Non-Acute Institutional Stay (SKILLED_NURSING_FACILITY): Payer: Medicare Other | Admitting: Internal Medicine

## 2020-07-04 ENCOUNTER — Encounter: Payer: Self-pay | Admitting: Internal Medicine

## 2020-07-04 DIAGNOSIS — R2681 Unsteadiness on feet: Secondary | ICD-10-CM | POA: Diagnosis not present

## 2020-07-04 DIAGNOSIS — F0280 Dementia in other diseases classified elsewhere without behavioral disturbance: Secondary | ICD-10-CM

## 2020-07-04 DIAGNOSIS — E039 Hypothyroidism, unspecified: Secondary | ICD-10-CM

## 2020-07-04 DIAGNOSIS — M810 Age-related osteoporosis without current pathological fracture: Secondary | ICD-10-CM

## 2020-07-04 DIAGNOSIS — K5909 Other constipation: Secondary | ICD-10-CM

## 2020-07-04 DIAGNOSIS — I872 Venous insufficiency (chronic) (peripheral): Secondary | ICD-10-CM

## 2020-07-04 DIAGNOSIS — I10 Essential (primary) hypertension: Secondary | ICD-10-CM

## 2020-07-04 DIAGNOSIS — G20A1 Parkinson's disease without dyskinesia, without mention of fluctuations: Secondary | ICD-10-CM

## 2020-07-04 DIAGNOSIS — G20 Parkinson's disease: Secondary | ICD-10-CM | POA: Diagnosis not present

## 2020-07-04 NOTE — Progress Notes (Signed)
Location:  [place] Room Number: 102 Place of Service:  SNF (31) Provider:  Goble Fudala L. Mariea Clonts, D.O., C.M.D.  Gayland Curry, DO  Patient Care Team: Gayland Curry, DO as PCP - General (Geriatric Medicine) Jola Schmidt, MD as Consulting Physician (Ophthalmology) Tat, Eustace Quail, DO as Consulting Physician (Neurology) Martinique, Peter M, MD as Consulting Physician (Cardiology)  Extended Emergency Contact Information Primary Emergency Contact: [name] Address: [address]          [name] Phone: [phone] Mobile Phone: [phone] Relation: Daughter  Code Status:  DNR Goals of care: Advanced Directive information Advanced Directives 07/04/2020  Does Patient Have a Medical Advance Directive? Yes  Type of Advance Directive Out of facility DNR (pink MOST or yellow form)  Does patient want to make changes to medical advance directive? No - Patient declined  Copy of [place] in Chart? -  Would patient like information on creating a medical advance directive? -  Pre-existing out of facility DNR order (yellow form or pink MOST form) Pink MOST/Yellow Form most recent copy in chart - Physician notified to receive inpatient order   Chief Complaint  Patient presents with  . Acute Visit    Daughter, [name]) wanted an assessment of her prognosis due to concerns about resources     HPI:  Pt is a 87 y.o. female with h/o Parkinson's disease with dementia, neuropathy, osteoporosis, compression fx, hypothyroidism, constipation among others seen today for an acute visit for evaluation of prognosis due to decreasing resources and family needing to develop the best plan for her.  Ms. Brevik has continued to decline cognitively.  She still ambulates with her walker and participates in the activities.  She remains sociable and knows who I am when I visit.  When in her room, she looks through her  personal items.  She also is noted by activities to move slowly and tire easily.  She talks about wanting to go see her mother.    Medically, she has had little change over the past couple of years outside of moving even slower with her PD.  She did sustain a fall and was found sitting on the floor 1/24.  She has had a skin tear to her left lateral leg when transferred from toilet to wheelchair on 1/29--this has been dressed with polymem. She denies pain and feels fine.    Past Medical History:  Diagnosis Date  . Aortic stenosis, mild   . Constipation, chronic   . Gilbert syndrome   . HTN (hypertension)   . Hyperlipidemia   . Hypothyroidism   . Melanoma ([place])   . Neuropathy   . Osteoporosis   . Parkinson's disease ([place])   . Peripheral neuropathy    Bilateral  . Rheumatic fever    does not take Flu shot    Past Surgical History:  Procedure Laterality Date  . ABDOMINAL HYSTERECTOMY  1963   no BSO; no abnormal PAPs  . COLONOSCOPY  2000   negative; [place]  . [place]  . MELANOMA EXCISION  08/2014    Allergies  Allergen Reactions  . Sulfonamide Derivatives     REACTION:generalized  swelling  . Gabapentin Hypertension and Swelling  . Sulfa Antibiotics Swelling    Outpatient Encounter Medications as of 07/04/2020  Medication Sig  . acetaminophen (TYLENOL) 325 MG tablet Take 325 mg by mouth at bedtime. And q 4 prn  . amLODipine (  NORVASC) 5 MG tablet Take 5 mg by mouth daily.  . Calcium Carbonate-Vitamin D (CALCIUM 600/VITAMIN D PO) Take 1 tablet by mouth daily.   . Carbidopa-Levodopa ER (SINEMET CR) 25-100 MG tablet controlled release 2 by mouth at 6 am, 1 by mouth at 11 am, and 1 by mouth at 4 pm  . Cholecalciferol (VITAMIN D) 2000 units CAPS Take 1 capsule by mouth daily.  . furosemide (LASIX) 20 MG tablet Take 10 mg by mouth as needed.   Marland Kitchen levothyroxine (SYNTHROID, LEVOTHROID) 50 MCG tablet Take one by mouth on Tuesday and Thursday 75 mcg other days  .  levothyroxine (SYNTHROID, LEVOTHROID) 75 MCG tablet Take 1 by mouth on Sunday, Monday, Wednesday, Friday, and Saturday  . Magnesium Sulfate, Laxative, GRAN To soak feet daily at bedtime after taking ted hose off as needed  . Melatonin 5 MG CAPS Take by mouth.  . Multiple Vitamin (MULTIVITAMIN) capsule Take 1 capsule by mouth daily.  Marland Kitchen omeprazole (PRILOSEC) 20 MG capsule Take 20 mg by mouth daily.   . polyethylene glycol (MIRALAX / GLYCOLAX) 17 g packet Take 17 g by mouth daily.   . potassium chloride (K-DUR) 10 MEQ tablet Take 10 mEq by mouth as needed. Use when taking Lasix  . QUEtiapine (SEROQUEL) 25 MG tablet Take 25 mg by mouth daily in the afternoon. At 4 pm  . QUEtiapine (SEROQUEL) 25 MG tablet Take 12.5 mg by mouth at bedtime.  . [DISCONTINUED] LORazepam (ATIVAN) 0.5 MG tablet Take 0.5 tablets (0.25 mg total) by mouth 2 (two) times daily as needed for anxiety.   No facility-administered encounter medications on file as of 07/04/2020.    Review of Systems  Constitutional: Negative for chills, fever and malaise/fatigue.  HENT: Negative for congestion and sore throat.   Eyes: Negative for blurred vision.       Glasses  Respiratory: Negative for cough and shortness of breath.   Cardiovascular: Positive for leg swelling. Negative for chest pain and palpitations.       Compression stockings worn  Gastrointestinal: Positive for constipation. Negative for abdominal pain, blood in stool and melena.       Gets occasional mag citrate  Genitourinary: Negative for dysuria.  Musculoskeletal: Positive for falls. Negative for joint pain.  Skin: Negative for itching and rash.       Skin tear LLE  Neurological: Negative for dizziness and loss of consciousness.  Endo/Heme/Allergies: Bruises/bleeds easily.  Psychiatric/Behavioral: Positive for memory loss. Negative for depression. The patient is not nervous/anxious and does not have insomnia.        Speaks about her mother     Immunization History   Administered Date(s) Administered  . Influenza-Unspecified 03/20/2020  . Moderna Sars-Covid-2 Vaccination 06/29/2019, 07/27/2019  . PPD Test 04/29/2016  . Tdap 08/11/2014   Pertinent  Health Maintenance Due  Topic Date Due  . DEXA SCAN  Completed  . INFLUENZA VACCINE  Discontinued  . PNA vac Low Risk Adult  Discontinued   Fall Risk  03/03/2020 01/13/2019 09/16/2018 05/13/2018 05/11/2018  Falls in the past year? 1 0 0 1 0  Number falls in past yr: 0 0 0 0 0  Comment - - - - -  Injury with Fall? 1 0 0 0 0  Comment - - - - -  Risk Factor Category  - - - - -  Risk for fall due to : History of fall(s);Impaired balance/gait - - - -  Follow up Falls evaluation completed - - - Falls  evaluation completed   Functional Status Survey:    Vitals:   07/04/20 1602  BP: (!) 144/62  Pulse: 60  Temp: 97.8 F (36.6 C)  SpO2: 94%  Weight: 127 lb 3.2 oz (57.7 kg)  Height: 5\' 4"  (1.626 m)   Body mass index is 21.83 kg/m. Physical Exam Vitals reviewed.  Constitutional:      General: She is not in acute distress.    Appearance: Normal appearance. She is not toxic-appearing.  HENT:     Head: Normocephalic and atraumatic.  Eyes:     Conjunctiva/sclera: Conjunctivae normal.     Pupils: Pupils are equal, round, and reactive to light.  Cardiovascular:     Rate and Rhythm: Normal rate and regular rhythm.     Pulses: Normal pulses.     Heart sounds: Murmur heard.    Pulmonary:     Effort: Pulmonary effort is normal.     Breath sounds: Normal breath sounds. No wheezing, rhonchi or rales.  Abdominal:     General: Bowel sounds are normal.     Palpations: Abdomen is soft.     Tenderness: There is no abdominal tenderness. There is no guarding or rebound.  Musculoskeletal:        General: Normal range of motion.     Comments: Kyphotic posture; edema with compression stockings in place  Skin:    General: Skin is warm and dry.     Comments: Skin tear on left lower extremity with polymem in  place and no surrounding erythema, warmth, drainage  Neurological:     Mental Status: She is alert.     Cranial Nerves: No cranial nerve deficit.     Sensory: Sensory deficit present.     Motor: No weakness.     Coordination: Coordination normal.     Gait: Gait abnormal.     Deep Tendon Reflexes: Reflexes normal.     Comments: Oriented to person and familiar faces, ambulates slowly with shuffling gait with rollator walker, occasional freezing, resting tremor  Psychiatric:        Mood and Affect: Mood normal.     Comments: Pleasant and conversive     Labs reviewed: No results for input(s): NA, K, CL, CO2, GLUCOSE, BUN, CREATININE, CALCIUM, MG, PHOS in the last 8760 hours. No results for input(s): AST, ALT, ALKPHOS, BILITOT, PROT, ALBUMIN in the last 8760 hours. No results for input(s): WBC, NEUTROABS, HGB, HCT, MCV, PLT in the last 8760 hours. Lab Results  Component Value Date   TSH 1.76 05/11/2019   No results found for: HGBA1C Lab Results  Component Value Date   CHOL 199 08/12/2014   HDL 87 08/12/2014   LDLCALC 104 (H) 08/12/2014   LDLDIRECT 105.9 06/15/2013   TRIG 38 08/12/2014   CHOLHDL 2.3 08/12/2014    Significant Diagnostic Results in last 30 days:  No results found.  Assessment/Plan 1. Parkinson's disease ([place]) -remains ambulatory with rollator and did have recent fall during transfer -cont sinemet and nursing support in memory care -prognostically, with her intake being good and her cognition very gradually declining, she likely has a couple of more years unless she were to have a fall with major injury or a new medical event we cannot anticipate -weight has actually trended up over months  2. Dementia associated with Parkinson's disease ([place]) -is progressing gradually, but behavior and sociability, as well as evening sundowning have not changed much over time -continues on seroquel for her delusions and hallucinations and does not seem  distressed by these    3. Acquired hypothyroidism -cont current alternating dose levothyroxine and monitor Lab Results  Component Value Date   TSH 1.76 05/11/2019    4. Chronic constipation -cont occasional mag sulfate laxative  5. Chronic venous insufficiency -cont compression hose   6. Primary hypertension -bp controlled fairly well with current regimen and no reported dizziness (at risk for orthostasis with her PD)  7. Senile osteoporosis -remains on ca with D and additional D3, walks around unit -not on other osteoporosis meds at this point   8. Unsteady gait -with recent fall -cont rollator walker use  Family/ staff Communication: note will be released for review  Labs/tests ordered:  No new added today  Carel Schnee L. Nikolaus Pienta, D.O. [facility] [address], [place] [address] Cell Phone (Mon-Fri 8am-5pm):  [phone] On Call:  [phone] & follow prompts after 5pm & weekends Office Phone:  [phone] Office Fax:  [phone]

## 2020-07-17 ENCOUNTER — Encounter: Payer: Self-pay | Admitting: Internal Medicine

## 2020-08-04 ENCOUNTER — Non-Acute Institutional Stay (SKILLED_NURSING_FACILITY): Payer: Medicare Other | Admitting: Adult Health

## 2020-08-04 ENCOUNTER — Encounter: Payer: Self-pay | Admitting: Adult Health

## 2020-08-04 DIAGNOSIS — I10 Essential (primary) hypertension: Secondary | ICD-10-CM

## 2020-08-04 DIAGNOSIS — G20 Parkinson's disease: Secondary | ICD-10-CM

## 2020-08-04 DIAGNOSIS — F0280 Dementia in other diseases classified elsewhere without behavioral disturbance: Secondary | ICD-10-CM

## 2020-08-04 DIAGNOSIS — K5909 Other constipation: Secondary | ICD-10-CM

## 2020-08-04 DIAGNOSIS — E039 Hypothyroidism, unspecified: Secondary | ICD-10-CM | POA: Diagnosis not present

## 2020-08-04 DIAGNOSIS — I872 Venous insufficiency (chronic) (peripheral): Secondary | ICD-10-CM | POA: Diagnosis not present

## 2020-08-04 NOTE — Progress Notes (Signed)
Location:  [facility] Room Number: 761-P Place of Service:  SNF [phone]) Provider:  Royal Hawthorn, NP   Patient Care Team: Gayland Curry, DO as PCP - General (Geriatric Medicine) Jola Schmidt, MD as Consulting Physician (Ophthalmology) Tat, Eustace Quail, DO as Consulting Physician (Neurology) Martinique, Peter M, MD as Consulting Physician (Cardiology)  Extended Emergency Contact Information Primary Emergency Contact: [name] Address: [address]          [place] Phone: [phone] Mobile Phone: [phone] Relation: Daughter  Code Status:  DNR  Goals of care: Advanced Directive information Advanced Directives 08/04/2020  Does Patient Have a Medical Advance Directive? Yes  Type of Paramedic of [place];Living will;Out of facility DNR (pink MOST or yellow form)  Does patient want to make changes to medical advance directive? No - Patient declined  Copy of [place] in Chart? Yes - validated most recent copy scanned in chart (See row information)  Would patient like information on creating a medical advance directive? -  Pre-existing out of facility DNR order (yellow form or pink MOST form) Yellow form placed in chart (order not valid for inpatient use);Pink MOST form placed in chart (order not valid for inpatient use)     Chief Complaint  Patient presents with  . Medical Management of Chronic Issues    Routine Visit     HPI:  Pt is a 87 y.o. female seen today for medical management of chronic diseases.    Ms. Fedorchak resides in the memory care unit due to progressive PD and dementia. She is no longer ambulatory and spends most of her time in the wheelchair. Prior to this she had festination and bradykinesia. She is on seroquel for agitation and paranoid behaviors. She is pleasant for the visit, able to f/c, and has no acute complaints.   SBP in the  130s  Has chronic edema in her legs, weight down 4 lbs  Wt Readings from Last 3 Encounters:  08/04/20 123 lb 9.6 oz (56.1 kg)  07/04/20 127 lb 3.2 oz (57.7 kg)  04/06/20 126 lb 3.2 oz (57.2 kg)   LBM 3/11 on miralax   Past Medical History:  Diagnosis Date  . Aortic stenosis, mild   . Constipation, chronic   . Gilbert syndrome   . HTN (hypertension)   . Hyperlipidemia   . Hypothyroidism   . Melanoma ([place])   . Neuropathy   . Osteoporosis   . Parkinson's disease ([place])   . Peripheral neuropathy    Bilateral  . Rheumatic fever    does not take Flu shot    Past Surgical History:  Procedure Laterality Date  . ABDOMINAL HYSTERECTOMY  1963   no BSO; no abnormal PAPs  . COLONOSCOPY  2000   negative; [place]  . [place]  . MELANOMA EXCISION  08/2014    Allergies  Allergen Reactions  . Sulfonamide Derivatives     REACTION:generalized  swelling  . Gabapentin Hypertension and Swelling  . Sulfa Antibiotics Swelling    Outpatient Encounter Medications as of 08/04/2020  Medication Sig  . acetaminophen (TYLENOL) 325 MG tablet Take 325 mg by mouth at bedtime. And q 4 prn  . amLODipine (NORVASC) 5 MG tablet Take 5 mg by mouth daily.  . Calcium Carbonate-Vitamin D (CALCIUM 600/VITAMIN D PO) Take 1 tablet by mouth daily.   . Carbidopa-Levodopa ER (SINEMET CR) 25-100 MG tablet controlled release 2  by mouth at 6 am, 1 by mouth at 11 am, and 1 by mouth at 4 pm  . Cholecalciferol (VITAMIN D) 2000 units CAPS Take 1 capsule by mouth daily.  Marland Kitchen levothyroxine (SYNTHROID, LEVOTHROID) 50 MCG tablet Take one by mouth on Tuesday and Thursday 75 mcg other days  . levothyroxine (SYNTHROID, LEVOTHROID) 75 MCG tablet Take 1 by mouth on Sunday, Monday, Wednesday, Friday, and Saturday  . Melatonin 5 MG CAPS Take by mouth.  . Multiple Vitamin (MULTIVITAMIN) capsule Take 1 capsule by mouth daily.  Marland Kitchen omeprazole (PRILOSEC) 20 MG capsule Take 20 mg by mouth daily.   . polyethylene glycol  (MIRALAX / GLYCOLAX) 17 g packet Take 17 g by mouth daily.   . QUEtiapine (SEROQUEL) 25 MG tablet Take 25 mg by mouth daily in the afternoon. At 4 pm  . QUEtiapine (SEROQUEL) 25 MG tablet Take 12.5 mg by mouth at bedtime.  . [DISCONTINUED] furosemide (LASIX) 20 MG tablet Take 10 mg by mouth as needed.   . [DISCONTINUED] Magnesium Sulfate, Laxative, GRAN To soak feet daily at bedtime after taking ted hose off as needed  . [DISCONTINUED] potassium chloride (K-DUR) 10 MEQ tablet Take 10 mEq by mouth as needed. Use when taking Lasix   No facility-administered encounter medications on file as of 08/04/2020.    Review of Systems  Constitutional: Negative for activity change, appetite change, chills, diaphoresis, fatigue, fever and unexpected weight change.  HENT: Negative for congestion.   Respiratory: Negative for cough, shortness of breath and wheezing.   Cardiovascular: Positive for leg swelling. Negative for chest pain and palpitations.  Gastrointestinal: Positive for abdominal distention (chronic ). Negative for abdominal pain, constipation and diarrhea.  Genitourinary: Negative for difficulty urinating and dysuria.  Musculoskeletal: Positive for gait problem. Negative for arthralgias, back pain, joint swelling and myalgias.  Neurological: Positive for weakness. Negative for dizziness, tremors, seizures, syncope, facial asymmetry, speech difficulty, light-headedness, numbness and headaches.  Psychiatric/Behavioral: Positive for confusion. Negative for agitation and behavioral problems.    Immunization History  Administered Date(s) Administered  . Influenza-Unspecified 03/20/2020  . Moderna SARS-COV2 Booster Vaccination 04/05/2020  . Moderna Sars-Covid-2 Vaccination 06/29/2019, 07/27/2019  . PPD Test 04/29/2016  . Tdap 08/11/2014   Pertinent  Health Maintenance Due  Topic Date Due  . DEXA SCAN  Completed  . INFLUENZA VACCINE  Discontinued  . PNA vac Low Risk Adult  Discontinued    Fall Risk  03/03/2020 01/13/2019 09/16/2018 05/13/2018 05/11/2018  Falls in the past year? 1 0 0 1 0  Number falls in past yr: 0 0 0 0 0  Comment - - - - -  Injury with Fall? 1 0 0 0 0  Comment - - - - -  Risk Factor Category  - - - - -  Risk for fall due to : History of fall(s);Impaired balance/gait - - - -  Follow up Falls evaluation completed - - - Falls evaluation completed   Functional Status Survey:    Vitals:   08/04/20 0958  BP: 138/62  Pulse: 64  Temp: (!) 97.4 F (36.3 C)  SpO2: 97%  Weight: 123 lb 9.6 oz (56.1 kg)  Height: 5\' 4"  (1.626 m)   Body mass index is 21.22 kg/m. Physical Exam Vitals and nursing note reviewed.  Constitutional:      General: She is not in acute distress.    Appearance: She is not diaphoretic.  HENT:     Head: Normocephalic and atraumatic.     Mouth/Throat:  Mouth: Mucous membranes are moist.     Pharynx: Oropharynx is clear. No oropharyngeal exudate.  Neck:     Vascular: No JVD.  Cardiovascular:     Rate and Rhythm: Normal rate and regular rhythm.     Heart sounds: Murmur heard.    Pulmonary:     Effort: Pulmonary effort is normal. No respiratory distress.     Breath sounds: Normal breath sounds. No wheezing.  Abdominal:     General: Bowel sounds are normal. There is distension (rotund).     Palpations: Abdomen is soft.     Tenderness: There is no abdominal tenderness.  Musculoskeletal:        General: No swelling, deformity or signs of injury.     Cervical back: No rigidity or tenderness.     Comments: BLE edema trace   Lymphadenopathy:     Cervical: No cervical adenopathy.  Skin:    General: Skin is warm and dry.  Neurological:     General: No focal deficit present.     Mental Status: She is alert. Mental status is at baseline.  Psychiatric:        Mood and Affect: Mood normal.     Labs reviewed: No results for input(s): NA, K, CL, CO2, GLUCOSE, BUN, CREATININE, CALCIUM, MG, PHOS in the last 8760 hours. No  results for input(s): AST, ALT, ALKPHOS, BILITOT, PROT, ALBUMIN in the last 8760 hours. No results for input(s): WBC, NEUTROABS, HGB, HCT, MCV, PLT in the last 8760 hours. Lab Results  Component Value Date   TSH 1.76 05/11/2019   No results found for: HGBA1C Lab Results  Component Value Date   CHOL 199 08/12/2014   HDL 87 08/12/2014   LDLCALC 104 (H) 08/12/2014   LDLDIRECT 105.9 06/15/2013   TRIG 38 08/12/2014   CHOLHDL 2.3 08/12/2014    Significant Diagnostic Results in last 30 days:  No results found.  Assessment/Plan  1. Primary hypertension Controlled Continue norvasc 5 mg qd   2. Chronic venous insufficiency Continue compression hose and elevation Has not needed lasix  3. Chronic constipation Controlled with miralax qd   4. Acquired hypothyroidism Needs TSH  5. Dementia associated with Parkinson's disease ([place]) MMSE 13/30 08/01/20. Continues to benefit from the support at [facility]  Would continue seroquel at this time.   6. Gilbert's syndrome No current issues, labs ordered    Labs/tests ordered:  CBC BMP TSH

## 2020-08-07 DIAGNOSIS — R531 Weakness: Secondary | ICD-10-CM | POA: Diagnosis not present

## 2020-08-07 DIAGNOSIS — E039 Hypothyroidism, unspecified: Secondary | ICD-10-CM | POA: Diagnosis not present

## 2020-08-07 DIAGNOSIS — E038 Other specified hypothyroidism: Secondary | ICD-10-CM | POA: Diagnosis not present

## 2020-08-07 LAB — CBC AND DIFFERENTIAL
HCT: 41 (ref 36–46)
Hemoglobin: 13.8 (ref 12.0–16.0)
Platelets: 164 (ref 150–399)
WBC: 5.5

## 2020-08-07 LAB — CBC: RBC: 4.34 (ref 3.87–5.11)

## 2020-08-07 LAB — COMPREHENSIVE METABOLIC PANEL
Albumin: 3.9 (ref 3.5–5.0)
Calcium: 9.2 (ref 8.7–10.7)
Globulin: 2.3

## 2020-08-07 LAB — BASIC METABOLIC PANEL
BUN: 20 (ref 4–21)
CO2: 26 — AB (ref 13–22)
Chloride: 109 — AB (ref 99–108)
Creatinine: 0.7 (ref 0.5–1.1)
Glucose: 84
Potassium: 4 (ref 3.4–5.3)
Sodium: 146 (ref 137–147)

## 2020-08-07 LAB — HEPATIC FUNCTION PANEL
AST: 18 (ref 13–35)
Alkaline Phosphatase: 51 (ref 25–125)
Bilirubin, Total: 0.6

## 2020-08-07 LAB — TSH: TSH: 2.06 (ref 0.41–5.90)

## 2020-08-09 ENCOUNTER — Encounter: Payer: Self-pay | Admitting: *Deleted

## 2020-09-15 ENCOUNTER — Encounter: Payer: Self-pay | Admitting: Orthopedic Surgery

## 2020-09-15 ENCOUNTER — Non-Acute Institutional Stay (SKILLED_NURSING_FACILITY): Payer: Medicare Other | Admitting: Orthopedic Surgery

## 2020-09-15 DIAGNOSIS — I872 Venous insufficiency (chronic) (peripheral): Secondary | ICD-10-CM

## 2020-09-15 DIAGNOSIS — K219 Gastro-esophageal reflux disease without esophagitis: Secondary | ICD-10-CM | POA: Diagnosis not present

## 2020-09-15 DIAGNOSIS — I10 Essential (primary) hypertension: Secondary | ICD-10-CM

## 2020-09-15 DIAGNOSIS — G20 Parkinson's disease: Secondary | ICD-10-CM | POA: Diagnosis not present

## 2020-09-15 DIAGNOSIS — E039 Hypothyroidism, unspecified: Secondary | ICD-10-CM | POA: Diagnosis not present

## 2020-09-15 DIAGNOSIS — K5909 Other constipation: Secondary | ICD-10-CM | POA: Diagnosis not present

## 2020-09-15 DIAGNOSIS — F0280 Dementia in other diseases classified elsewhere without behavioral disturbance: Secondary | ICD-10-CM | POA: Diagnosis not present

## 2020-09-15 NOTE — Progress Notes (Signed)
Location:    [facility] Room Number: 425 ZDGLO of Service:  SNF (31) Provider: Windell Moulding, AGNP-C  Gayland Curry, DO  Patient Care Team: Gayland Curry, DO as PCP - General (Geriatric Medicine) Jola Schmidt, MD as Consulting Physician (Ophthalmology) Tat, Eustace Quail, DO as Consulting Physician (Neurology) [place], Peter M, MD as Consulting Physician (Cardiology)  Extended Emergency Contact Information Primary Emergency Contact: [name] Address: [address]          [place] Phone: [phone] Mobile Phone: [phone] Relation: Daughter  Code Status:  DNR Goals of care: Advanced Directive information Advanced Directives 09/15/2020  Does Patient Have a Medical Advance Directive? Yes  Type of Paramedic of [place];Living will;Out of facility DNR (pink MOST or yellow form)  Does patient want to make changes to medical advance directive? No - Patient declined  Copy of [place] in Chart? Yes - validated most recent copy scanned in chart (See row information)  Would patient like information on creating a medical advance directive? -  Pre-existing out of facility DNR order (yellow form or pink MOST form) Yellow form placed in chart (order not valid for inpatient use);Pink MOST form placed in chart (order not valid for inpatient use)     Chief Complaint  Patient presents with  . Medical Management of Chronic Issues    HPI:  Pt is a 87 y.o. female seen today for medical management of chronic diseases.    Today, she is alert to self and familiar faces. Follows commands and can express needs. Continues to reside on memory care unit due to progressive dementia. No recent falls. Ambulates with wheelchair. Able to feed self. During our encounter she would ask where her car was. She would also state " my daughter is going to [place], [place] Wednesday, she is  helping me with my car," every few minutes." Very pleasant during our conversation.   No recent falls, injuries or behavioral outbursts.   Last BM today.   Recent blood pressure are as follows:  04/19- 134/74  04/12- 118/74  03/30- 134/76  03/25- 128/70  Weight trends are as follows:  04/01- 128.8 lbs  03/01- 128.0 lbs  02/01- 128.8 lbs  01/03- 126.4 lbs  Left skin tear painless and slow healing, no sign of infection.   Nurse does not report any concerns, vitals stable.    Past Medical History:  Diagnosis Date  . Aortic stenosis, mild   . Constipation, chronic   . Gilbert syndrome   . HTN (hypertension)   . Hyperlipidemia   . Hypothyroidism   . Melanoma ([place])   . Neuropathy   . Osteoporosis   . Parkinson's disease ([place])   . Peripheral neuropathy    Bilateral  . Rheumatic fever    does not take Flu shot    Past Surgical History:  Procedure Laterality Date  . ABDOMINAL HYSTERECTOMY  1963   no BSO; no abnormal PAPs  . COLONOSCOPY  2000   negative; [place]  . [place]  . MELANOMA EXCISION  08/2014    Allergies  Allergen Reactions  . Sulfonamide Derivatives     REACTION:generalized  swelling  . Gabapentin Hypertension and Swelling  . Sulfa Antibiotics Swelling    Allergies as of 09/15/2020      Reactions   Sulfonamide Derivatives    REACTION:generalized  swelling   Gabapentin Hypertension, Swelling   Sulfa Antibiotics Swelling  Medication List       Accurate as of September 15, 2020  2:22 PM. If you have any questions, ask your nurse or doctor.        acetaminophen 325 MG tablet Commonly known as: TYLENOL Take 325 mg by mouth at bedtime. And q 4 prn   amLODipine 5 MG tablet Commonly known as: NORVASC Take 5 mg by mouth daily.   CALCIUM 600/VITAMIN D PO Take 1 tablet by mouth daily.   Carbidopa-Levodopa ER 25-100 MG tablet controlled release Commonly known as: SINEMET CR 2 by mouth at 6 am, 1 by mouth at 11 am, and 1 by  mouth at 4 pm   levothyroxine 50 MCG tablet Commonly known as: SYNTHROID Take one by mouth on Tuesday and Thursday 75 mcg other days   levothyroxine 75 MCG tablet Commonly known as: SYNTHROID Take 1 by mouth on Sunday, Monday, Wednesday, Friday, and Saturday   Melatonin 5 MG Caps Take by mouth.   multivitamin capsule Take 1 capsule by mouth daily.   omeprazole 20 MG capsule Commonly known as: PRILOSEC Take 20 mg by mouth daily.   polyethylene glycol 17 g packet Commonly known as: MIRALAX / GLYCOLAX Take 17 g by mouth daily.   QUEtiapine 25 MG tablet Commonly known as: SEROQUEL Take 25 mg by mouth daily in the afternoon. At 4 pm   QUEtiapine 25 MG tablet Commonly known as: SEROQUEL Take 12.5 mg by mouth at bedtime.   Vitamin D 50 MCG (2000 UT) Caps Take 1 capsule by mouth daily.       Review of Systems  Unable to perform ROS: Dementia    Immunization History  Administered Date(s) Administered  . Influenza-Unspecified 03/20/2020  . Moderna SARS-COV2 Booster Vaccination 04/05/2020  . Moderna Sars-Covid-2 Vaccination 06/29/2019, 07/27/2019  . PPD Test 04/29/2016  . Tdap 08/11/2014   Pertinent  Health Maintenance Due  Topic Date Due  . DEXA SCAN  Completed  . INFLUENZA VACCINE  Discontinued  . PNA vac Low Risk Adult  Discontinued   Fall Risk  03/03/2020 01/13/2019 09/16/2018 05/13/2018 05/11/2018  Falls in the past year? 1 0 0 1 0  Number falls in past yr: 0 0 0 0 0  Comment - - - - -  Injury with Fall? 1 0 0 0 0  Comment - - - - -  Risk Factor Category  - - - - -  Risk for fall due to : History of fall(s);Impaired balance/gait - - - -  Follow up Falls evaluation completed - - - Falls evaluation completed   Functional Status Survey:    Vitals:   09/15/20 1416  BP: 134/74  Pulse: 60  Resp: 18  Temp: (!) 97.5 F (36.4 C)  SpO2: 96%  Weight: 128 lb 12.8 oz (58.4 kg)  Height: 5\' 4"  (1.626 m)   Body mass index is 22.11 kg/m. Physical  Exam Constitutional:      General: She is not in acute distress. HENT:     Head: Normocephalic.     Right Ear: There is no impacted cerumen.     Left Ear: There is no impacted cerumen.     Nose: Nose normal.     Mouth/Throat:     Mouth: Mucous membranes are moist.  Eyes:     General:        Right eye: No discharge.        Left eye: No discharge.  Neck:     Thyroid: No thyroid mass  or thyromegaly.  Cardiovascular:     Rate and Rhythm: Normal rate and regular rhythm.     Pulses: Normal pulses.     Heart sounds: Murmur heard.    Pulmonary:     Effort: Pulmonary effort is normal. No respiratory distress.     Breath sounds: Normal breath sounds. No wheezing.  Abdominal:     General: Bowel sounds are normal. There is distension.     Palpations: There is no mass.     Tenderness: There is no abdominal tenderness.     Hernia: No hernia is present.  Musculoskeletal:     Cervical back: Normal range of motion.     Right lower leg: Edema present.     Left lower leg: Edema present.     Comments: Right +1, left non-pitting  Lymphadenopathy:     Cervical: No cervical adenopathy.  Skin:    General: Skin is warm and dry.     Capillary Refill: Capillary refill takes less than 2 seconds.     Findings: Lesion present.     Comments: Skin tear to left elbow CDI, no sign of infection.   Neurological:     General: No focal deficit present.     Mental Status: She is alert. Mental status is at baseline.     Motor: Weakness present.     Gait: Gait abnormal.     Comments: wheelchair  Psychiatric:        Mood and Affect: Mood normal.        Behavior: Behavior normal.        Cognition and Memory: Memory is impaired.     Labs reviewed: Recent Labs    08/07/20 0000  NA 146  K 4.0  CL 109*  CO2 26*  BUN 20  CREATININE 0.7  CALCIUM 9.2   Recent Labs    08/07/20 0000  AST 18  ALKPHOS 51  ALBUMIN 3.9   Recent Labs    08/07/20 0000  WBC 5.5  HGB 13.8  HCT 41  PLT 164   Lab  Results  Component Value Date   TSH 2.06 08/07/2020   No results found for: HGBA1C Lab Results  Component Value Date   CHOL 199 08/12/2014   HDL 87 08/12/2014   LDLCALC 104 (H) 08/12/2014   LDLDIRECT 105.9 06/15/2013   TRIG 38 08/12/2014   CHOLHDL 2.3 08/12/2014    Significant Diagnostic Results in last 30 days:  No results found.  Assessment/Plan 1. 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 Primary hypertension - bp at goal < 150/90 - cont amlodipine - cont low sodium diet  3. Chronic venous insufficiency - RLE worse than left - cont compression stockings - encourage leg elevation a few hours daily  4. Acquired hypothyroidism - TSH 2.06 08/07/20 - cont levothyroxine regimen  5. Chronic constipation - BM today - cont daily miralax  6. Gilbert's syndrome - abdomen distended, no pain with palpation - AST 18, ALP 51 08/07/2020  7. Gastroesophageal reflux disease, unspecified whether esophagitis present - stable with prilosec    Family/ staff Communication: plan discussed with patient and nurse  Labs/tests ordered:  none

## 2020-10-16 ENCOUNTER — Non-Acute Institutional Stay (SKILLED_NURSING_FACILITY): Payer: Medicare Other | Admitting: Adult Health

## 2020-10-16 DIAGNOSIS — I350 Nonrheumatic aortic (valve) stenosis: Secondary | ICD-10-CM | POA: Diagnosis not present

## 2020-10-16 DIAGNOSIS — F0280 Dementia in other diseases classified elsewhere without behavioral disturbance: Secondary | ICD-10-CM

## 2020-10-16 DIAGNOSIS — I10 Essential (primary) hypertension: Secondary | ICD-10-CM

## 2020-10-16 DIAGNOSIS — I872 Venous insufficiency (chronic) (peripheral): Secondary | ICD-10-CM

## 2020-10-16 DIAGNOSIS — G609 Hereditary and idiopathic neuropathy, unspecified: Secondary | ICD-10-CM

## 2020-10-16 DIAGNOSIS — M810 Age-related osteoporosis without current pathological fracture: Secondary | ICD-10-CM

## 2020-10-16 DIAGNOSIS — E039 Hypothyroidism, unspecified: Secondary | ICD-10-CM

## 2020-10-16 DIAGNOSIS — G20 Parkinson's disease: Secondary | ICD-10-CM | POA: Diagnosis not present

## 2020-10-19 ENCOUNTER — Encounter: Payer: Self-pay | Admitting: Adult Health

## 2020-10-19 NOTE — Progress Notes (Signed)
Location:  [facility] of Service:  SNF (31) Provider:   Cindi Carbon, ANP [place] [id number]  Gayland Curry, DO  Patient Care Team: Gayland Curry, DO as PCP - General (Geriatric Medicine) Jola Schmidt, MD as Consulting Physician (Ophthalmology) Tat, Eustace Quail, DO as Consulting Physician (Neurology) Martinique, Peter M, MD as Consulting Physician (Cardiology)  Extended Emergency Contact Information Primary Emergency Contact: [name] Address: [address]          [name] Phone: [phone] Mobile Phone: [phone] Relation: Daughter  Code Status:  DNR Goals of care: Advanced Directive information Advanced Directives 10/19/2020  Does Patient Have a Medical Advance Directive? Yes  Type of Paramedic of [place];Living will;Out of facility DNR (pink MOST or yellow form)  Does patient want to make changes to medical advance directive? -  Copy of [place] in Chart? Yes - validated most recent copy scanned in chart (See row information)  Would patient like information on creating a medical advance directive? -  Pre-existing out of facility DNR order (yellow form or pink MOST form) Yellow form placed in chart (order not valid for inpatient use);Pink MOST form placed in chart (order not valid for inpatient use)     Chief Complaint  Patient presents with  . Medical Management of Chronic Issues    HPI:  Pt is a 87 y.o. female seen today for medical management of chronic diseases.  PMH significant for AS, constipation, PD, dementia, gilbert syndrome, HTN, venous insuff, HLD, and neuropathy.   She resides in the memory care setting. No longer ambulatory and requires assistance for ADLs MMSE 13/30 3/22  Nurse reports occasional period of resistance to care but she is generally pleasant. Continues on Seroquel for issues of agitation/delusions  which has been helpful.   No acute complaints are noted.    Wt Readings from Last 3 Encounters:  10/19/20 127 lb (57.6 kg)  09/15/20 128 lb 12.8 oz (58.4 kg)  08/04/20 123 lb 9.6 oz (56.1 kg)   BP variable ranging 112-160  Goals of care are comfort based with most form and DNR in place.   Past Medical History:  Diagnosis Date  . Aortic stenosis, mild   . Constipation, chronic   . Gilbert syndrome   . HTN (hypertension)   . Hyperlipidemia   . Hypothyroidism   . Melanoma ([place])   . Neuropathy   . Osteoporosis   . Parkinson's disease ([place])   . Peripheral neuropathy    Bilateral  . Rheumatic fever    does not take Flu shot    Past Surgical History:  Procedure Laterality Date  . ABDOMINAL HYSTERECTOMY  1963   no BSO; no abnormal PAPs  . COLONOSCOPY  2000   negative;   . [place]  . MELANOMA EXCISION  08/2014    Allergies  Allergen Reactions  . Sulfonamide Derivatives     REACTION:generalized  swelling  . Gabapentin Hypertension and Swelling  . Sulfa Antibiotics Swelling    Outpatient Encounter Medications as of 10/16/2020  Medication Sig  . acetaminophen (TYLENOL) 325 MG tablet Take 325 mg by mouth at bedtime. And q 4 prn  . amLODipine (NORVASC) 5 MG tablet Take 5 mg by mouth daily.  . Calcium Carbonate-Vitamin D (CALCIUM 600/VITAMIN D PO) Take 1 tablet by mouth daily.   . Carbidopa-Levodopa ER (SINEMET CR) 25-100 MG tablet controlled release 2  by mouth at 6 am, 1 by mouth at 11 am, and 1 by mouth at 4 pm  . Cholecalciferol (VITAMIN D) 2000 units CAPS Take 1 capsule by mouth daily.  Marland Kitchen levothyroxine (SYNTHROID, LEVOTHROID) 50 MCG tablet Take one by mouth on Tuesday and Thursday 75 mcg other days  . levothyroxine (SYNTHROID, LEVOTHROID) 75 MCG tablet Take 1 by mouth on Sunday, Monday, Wednesday, Friday, and Saturday  . Melatonin 5 MG CAPS Take by mouth.  . Multiple Vitamin (MULTIVITAMIN) capsule Take 1 capsule by mouth daily.  Marland Kitchen omeprazole  (PRILOSEC) 20 MG capsule Take 20 mg by mouth daily.   . polyethylene glycol (MIRALAX / GLYCOLAX) 17 g packet Take 17 g by mouth daily.   . QUEtiapine (SEROQUEL) 25 MG tablet Take 25 mg by mouth daily in the afternoon. At 4 pm  . QUEtiapine (SEROQUEL) 25 MG tablet Take 12.5 mg by mouth at bedtime.   No facility-administered encounter medications on file as of 10/16/2020.    Review of Systems  Constitutional: Negative for activity change, appetite change, chills, diaphoresis, fatigue, fever and unexpected weight change.  HENT: Negative for congestion.   Respiratory: Negative for cough, shortness of breath and wheezing.   Cardiovascular: Positive for leg swelling. Negative for chest pain and palpitations.  Gastrointestinal: Negative for abdominal distention, abdominal pain, constipation and diarrhea.  Genitourinary: Negative for difficulty urinating and dysuria.  Musculoskeletal: Positive for gait problem. Negative for arthralgias, back pain, joint swelling and myalgias.  Skin: Negative for wound.  Neurological: Negative for dizziness, tremors, seizures, syncope, facial asymmetry, speech difficulty, weakness (general), light-headedness, numbness and headaches.  Psychiatric/Behavioral: Positive for confusion. Negative for agitation, behavioral problems and sleep disturbance.    Immunization History  Administered Date(s) Administered  . Influenza-Unspecified 03/20/2020  . Moderna SARS-COV2 Booster Vaccination 04/05/2020  . Moderna Sars-Covid-2 Vaccination 06/29/2019, 07/27/2019  . PPD Test 04/29/2016  . Tdap 08/11/2014   Pertinent  Health Maintenance Due  Topic Date Due  . DEXA SCAN  Completed  . INFLUENZA VACCINE  Discontinued  . PNA vac Low Risk Adult  Discontinued   Fall Risk  10/19/2020 03/03/2020 01/13/2019 09/16/2018 05/13/2018  Falls in the past year? Exclusion - non ambulatory 1 0 0 1  Number falls in past yr: - 0 0 0 0  Comment - - - - -  Injury with Fall? - 1 0 0 0  Comment -  - - - -  Risk Factor Category  - - - - -  Risk for fall due to : Impaired balance/gait;History of fall(s) History of fall(s);Impaired balance/gait - - -  Follow up Falls evaluation completed Falls evaluation completed - - -   Functional Status Survey:    Vitals:   10/19/20 1137  Weight: 127 lb (57.6 kg)   Body mass index is 21.8 kg/m. Physical Exam Vitals and nursing note reviewed.  Constitutional:      General: She is not in acute distress.    Appearance: She is not diaphoretic.  HENT:     Head: Normocephalic and atraumatic.     Nose: Nose normal. No congestion.     Mouth/Throat:     Mouth: Mucous membranes are moist.     Pharynx: Oropharynx is clear.  Neck:     Vascular: No JVD.  Cardiovascular:     Rate and Rhythm: Normal rate and regular rhythm.     Heart sounds: Murmur heard.    Pulmonary:     Effort: Pulmonary effort is normal. No respiratory distress.  Breath sounds: Normal breath sounds. No wheezing.  Abdominal:     General: Bowel sounds are normal. There is distension (mild and chronic ).  Musculoskeletal:     Comments: BLE edema +1  Skin:    General: Skin is warm and dry.  Neurological:     General: No focal deficit present.     Mental Status: She is alert. Mental status is at baseline.     Comments: Mild rigidity noted to BUE and BLE no tremor  Psychiatric:        Mood and Affect: Mood normal.     Labs reviewed: Recent Labs    08/07/20 0000  NA 146  K 4.0  CL 109*  CO2 26*  BUN 20  CREATININE 0.7  CALCIUM 9.2   Recent Labs    08/07/20 0000  AST 18  ALKPHOS 51  ALBUMIN 3.9   Recent Labs    08/07/20 0000  WBC 5.5  HGB 13.8  HCT 41  PLT 164   Lab Results  Component Value Date   TSH 2.06 08/07/2020   No results found for: HGBA1C Lab Results  Component Value Date   CHOL 199 08/12/2014   HDL 87 08/12/2014   LDLCALC 104 (H) 08/12/2014   LDLDIRECT 105.9 06/15/2013   TRIG 38 08/12/2014   CHOLHDL 2.3 08/12/2014     Significant Diagnostic Results in last 30 days:  No results found.  Assessment/Plan  1. Parkinson's disease ([place]) Continue Sinemet Cr 2 tabs at 6, 1 at 11 and 1 at 4p  2. Acquired hypothyroidism Lab Results  Component Value Date   TSH 2.06 08/07/2020  Continue Synthroid 50 mcg on Tues and Thurs and 75 mcg the other days   3. Hereditary and idiopathic peripheral neuropathy No current complaints of pain or wounds  4. 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 Continue supportive care in the skilled memory care environment.  5. Senile osteoporosis With prior hx of compression fx Continue Ca and Vit D  6. Aortic valve stenosis, etiology of cardiac valve disease unspecified Murmur noted on exam, no current symptoms  7. Chronic venous insufficiency Encourage elevation Compression hose  8. Primary hypertension Controlled Continue Norvasc 5 mg qd  Labs/tests ordered:  NA

## 2020-11-16 ENCOUNTER — Encounter: Payer: Self-pay | Admitting: Adult Health

## 2020-11-16 ENCOUNTER — Non-Acute Institutional Stay (SKILLED_NURSING_FACILITY): Payer: Medicare Other | Admitting: Adult Health

## 2020-11-16 DIAGNOSIS — I872 Venous insufficiency (chronic) (peripheral): Secondary | ICD-10-CM

## 2020-11-16 DIAGNOSIS — K5909 Other constipation: Secondary | ICD-10-CM

## 2020-11-16 DIAGNOSIS — I10 Essential (primary) hypertension: Secondary | ICD-10-CM | POA: Diagnosis not present

## 2020-11-16 DIAGNOSIS — E039 Hypothyroidism, unspecified: Secondary | ICD-10-CM | POA: Diagnosis not present

## 2020-11-16 DIAGNOSIS — F0280 Dementia in other diseases classified elsewhere without behavioral disturbance: Secondary | ICD-10-CM | POA: Diagnosis not present

## 2020-11-16 DIAGNOSIS — G20 Parkinson's disease: Secondary | ICD-10-CM | POA: Diagnosis not present

## 2020-11-16 NOTE — Progress Notes (Signed)
Location:  [facility] of Service:  SNF (31) Provider:   Cindi Carbon, [place] [phone]   Royal Hawthorn, NP  Patient Care Team: Royal Hawthorn, NP as PCP - General (Nurse Practitioner) Jola Schmidt, MD as Consulting Physician (Ophthalmology) Tat, Eustace Quail, DO as Consulting Physician (Neurology) [place]ique, Peter M, MD as Consulting Physician (Cardiology)  Extended Emergency Contact Information Primary Emergency Contact: [name] Address: [address]          [place] Phone: [phone] Mobile Phone: [phone] Relation: Daughter  Code Status:  DNR Goals of care: Advanced Directive information Advanced Directives 11/16/2020  Does Patient Have a Medical Advance Directive? Yes  Type of Paramedic of [place];Living will;Out of facility DNR (pink MOST or yellow form)  Does patient want to make changes to medical advance directive? -  Copy of [place] in Chart? Yes - validated most recent copy scanned in chart (See row information)  Would patient like information on creating a medical advance directive? -  Pre-existing out of facility DNR order (yellow form or pink MOST form) Yellow form placed in chart (order not valid for inpatient use);Pink MOST form placed in chart (order not valid for inpatient use)     Chief Complaint  Patient presents with   Medical Management of Chronic Issues    HPI:  Pt is a 87 y.o. female seen today for medical management of chronic diseases.   PMH significant for AS, constipation, PD, dementia, gilbert syndrome, HTN, venous insuff, HLD, and neuropathy.   She resides in the memory care setting. No longer ambulatory and requires assistance for ADLs MMSE 13/30 3/22  Nurse reports resistance to care at times but this is unchanged and she is re-directed fairly easily per staff  BP 130/67 BP 149/78 BP  139/90  No issues with weight gain or cold intolerance Lab Results  Component Value Date   TSH 2.06 08/07/2020   PD: can walk short distances in her room with a walker but mostly uses the WC, has some rigidity and shuffling gait     Past Medical History:  Diagnosis Date   Aortic stenosis, mild    Constipation, chronic    Gilbert syndrome    HTN (hypertension)    Hyperlipidemia    Hypothyroidism    Melanoma ([place])    Neuropathy    Osteoporosis    Parkinson's disease ([place])    Peripheral neuropathy    Bilateral   Rheumatic fever    does not take Flu shot    Past Surgical History:  Procedure Laterality Date   ABDOMINAL HYSTERECTOMY  1963   no BSO; no abnormal PAPs   COLONOSCOPY  2000   negative; [place]  08/2014    Allergies  Allergen Reactions   Sulfonamide Derivatives     REACTION:generalized  swelling   Gabapentin Hypertension and Swelling   Sulfa Antibiotics Swelling    Outpatient Encounter Medications as of 11/16/2020  Medication Sig   acetaminophen (TYLENOL) 325 MG tablet Take 325 mg by mouth at bedtime. And q 4 prn   amLODipine (NORVASC) 5 MG tablet Take 5 mg by mouth daily.   Calcium Carbonate-Vitamin D (CALCIUM 600/VITAMIN D PO) Take 1 tablet by mouth daily.    Carbidopa-Levodopa ER (SINEMET CR) 25-100 MG tablet controlled release 2 by mouth at 6 am, 1 by mouth at 11  am, and 1 by mouth at 4 pm   Cholecalciferol (VITAMIN D) 2000 units CAPS Take 1 capsule by mouth daily.   levothyroxine (SYNTHROID, LEVOTHROID) 50 MCG tablet Take one by mouth on Tuesday and Thursday 75 mcg other days   levothyroxine (SYNTHROID, LEVOTHROID) 75 MCG tablet Take 1 by mouth on Sunday, Monday, Wednesday, Friday, and Saturday   Melatonin 5 MG CAPS Take by mouth.   Multiple Vitamin (MULTIVITAMIN) capsule Take 1 capsule by mouth daily.   omeprazole (PRILOSEC) 20 MG capsule Take 20 mg by mouth daily.    polyethylene glycol (MIRALAX /  GLYCOLAX) 17 g packet Take 17 g by mouth daily.    QUEtiapine (SEROQUEL) 25 MG tablet Take 25 mg by mouth daily in the afternoon. At 4 pm   QUEtiapine (SEROQUEL) 25 MG tablet Take 12.5 mg by mouth at bedtime.   No facility-administered encounter medications on file as of 11/16/2020.    Review of Systems  Constitutional:  Negative for activity change, appetite change, chills, diaphoresis, fatigue, fever and unexpected weight change.  HENT:  Negative for congestion.   Respiratory:  Negative for cough, shortness of breath and wheezing.   Cardiovascular:  Negative for chest pain, palpitations and leg swelling.  Gastrointestinal:  Negative for abdominal distention, abdominal pain, constipation and diarrhea.  Genitourinary:  Negative for difficulty urinating and dysuria.  Musculoskeletal:  Positive for gait problem. Negative for arthralgias, back pain, joint swelling and myalgias.  Neurological:  Negative for dizziness, tremors, seizures, syncope, facial asymmetry, speech difficulty, weakness, light-headedness, numbness and headaches.  Psychiatric/Behavioral:  Positive for confusion. The patient is not nervous/anxious.    Immunization History  Administered Date(s) Administered   Influenza-Unspecified 03/20/2020   Moderna SARS-COV2 Booster Vaccination 04/05/2020   Moderna Sars-Covid-2 Vaccination 06/29/2019, 07/27/2019   PPD Test 04/29/2016   Tdap 08/11/2014   Pertinent  Health Maintenance Due  Topic Date Due   DEXA SCAN  Completed   INFLUENZA VACCINE  Discontinued   PNA vac Low Risk Adult  Discontinued   Fall Risk  11/16/2020 10/19/2020 03/03/2020 01/13/2019 09/16/2018  Falls in the past year? 0 Exclusion - non ambulatory 1 0 0  Number falls in past yr: 0 - 0 0 0  Comment - - - - -  Injury with Fall? 0 - 1 0 0  Comment - - - - -  Risk Factor Category  - - - - -  Risk for fall due to : History of fall(s);Impaired balance/gait Impaired balance/gait;History of fall(s) History of  fall(s);Impaired balance/gait - -  Follow up Falls evaluation completed Falls evaluation completed Falls evaluation completed - -   Functional Status Survey:    Vitals:   11/16/20 1057  Weight: 126 lb 6.4 oz (57.3 kg)   Body mass index is 21.7 kg/m. Physical Exam Vitals and nursing note reviewed.  Constitutional:      General: She is not in acute distress.    Appearance: She is not diaphoretic.  HENT:     Head: Normocephalic and atraumatic.  Neck:     Vascular: No JVD.  Cardiovascular:     Rate and Rhythm: Normal rate and regular rhythm.     Heart sounds: Murmur heard.  Pulmonary:     Effort: Pulmonary effort is normal. No respiratory distress.     Breath sounds: Normal breath sounds. No wheezing.  Abdominal:     General: Bowel sounds are normal. There is distension (mild and chronic).     Palpations: There is no mass.  Tenderness: There is no abdominal tenderness.  Musculoskeletal:        General: Deformity (kyphosis) present. No swelling, tenderness or signs of injury.     Comments: Trace BLE edema  Skin:    General: Skin is warm and dry.  Neurological:     General: No focal deficit present.     Mental Status: She is alert. Mental status is at baseline.  Psychiatric:        Mood and Affect: Mood normal.    Labs reviewed: Recent Labs    08/07/20 0000  NA 146  K 4.0  CL 109*  CO2 26*  BUN 20  CREATININE 0.7  CALCIUM 9.2   Recent Labs    08/07/20 0000  AST 18  ALKPHOS 51  ALBUMIN 3.9   Recent Labs    08/07/20 0000  WBC 5.5  HGB 13.8  HCT 41  PLT 164   Lab Results  Component Value Date   TSH 2.06 08/07/2020   No results found for: HGBA1C Lab Results  Component Value Date   CHOL 199 08/12/2014   HDL 87 08/12/2014   LDLCALC 104 (H) 08/12/2014   LDLDIRECT 105.9 06/15/2013   TRIG 38 08/12/2014   CHOLHDL 2.3 08/12/2014   Wt Readings from Last 3 Encounters:  11/16/20 126 lb 6.4 oz (57.3 kg)  10/19/20 127 lb (57.6 kg)  09/15/20 128 lb  12.8 oz (58.4 kg)     Significant Diagnostic Results in last 30 days:  No results found.  Assessment/Plan  1. 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   2. Parkinson's disease ([place]) Walking only short distances now, mostly in the [place] to benefit from sinemet Continue to monitor for s/e and or progression   3. Acquired hypothyroidism Lab Results  Component Value Date   TSH 2.06 08/07/2020   Continue Synthroid  4. Chronic constipation Continue miralax 17 grams qd   5. Primary hypertension Controlled Continue norvasc 5 mg qd   6. Chronic venous insufficiency Weight stable  Compression and elevation   Labs/tests ordered:  NA

## 2020-12-08 DIAGNOSIS — Z9189 Other specified personal risk factors, not elsewhere classified: Secondary | ICD-10-CM | POA: Diagnosis not present

## 2020-12-08 DIAGNOSIS — Z20828 Contact with and (suspected) exposure to other viral communicable diseases: Secondary | ICD-10-CM | POA: Diagnosis not present

## 2020-12-11 DIAGNOSIS — Z20828 Contact with and (suspected) exposure to other viral communicable diseases: Secondary | ICD-10-CM | POA: Diagnosis not present

## 2020-12-11 DIAGNOSIS — Z9189 Other specified personal risk factors, not elsewhere classified: Secondary | ICD-10-CM | POA: Diagnosis not present

## 2020-12-19 ENCOUNTER — Non-Acute Institutional Stay (SKILLED_NURSING_FACILITY): Payer: Medicare Other | Admitting: Orthopedic Surgery

## 2020-12-19 ENCOUNTER — Encounter: Payer: Self-pay | Admitting: Orthopedic Surgery

## 2020-12-19 DIAGNOSIS — E039 Hypothyroidism, unspecified: Secondary | ICD-10-CM

## 2020-12-19 DIAGNOSIS — G20 Parkinson's disease: Secondary | ICD-10-CM

## 2020-12-19 DIAGNOSIS — I10 Essential (primary) hypertension: Secondary | ICD-10-CM | POA: Diagnosis not present

## 2020-12-19 DIAGNOSIS — I872 Venous insufficiency (chronic) (peripheral): Secondary | ICD-10-CM | POA: Diagnosis not present

## 2020-12-19 DIAGNOSIS — K219 Gastro-esophageal reflux disease without esophagitis: Secondary | ICD-10-CM | POA: Diagnosis not present

## 2020-12-19 DIAGNOSIS — U071 COVID-19: Secondary | ICD-10-CM | POA: Diagnosis not present

## 2020-12-19 DIAGNOSIS — K5909 Other constipation: Secondary | ICD-10-CM

## 2020-12-19 DIAGNOSIS — F0280 Dementia in other diseases classified elsewhere without behavioral disturbance: Secondary | ICD-10-CM

## 2020-12-19 DIAGNOSIS — G20A1 Parkinson's disease without dyskinesia, without mention of fluctuations: Secondary | ICD-10-CM

## 2020-12-19 NOTE — Progress Notes (Signed)
Location:  [facility] Room Number: [place] of Service:  SNF ([phone]) Provider:  Windell Moulding, AGNP-C  Royal Hawthorn, NP  Patient Care Team: Royal Hawthorn, NP as PCP - General (Nurse Practitioner) Jola Schmidt, MD as Consulting Physician (Ophthalmology) Tat, Eustace Quail, DO as Consulting Physician (Neurology) Martinique, Peter M, MD as Consulting Physician (Cardiology)  Extended Emergency Contact Information Primary Emergency Contact: [name] Address: [address], [place] Phone: [phone] Mobile Phone: [phone] Relation: Daughter  Code Status:  DNR Goals of care: Advanced Directive information Advanced Directives 11/16/2020  Does Patient Have a Medical Advance Directive? Yes  Type of Paramedic of [place];Living will;Out of facility DNR (pink MOST or yellow form)  Does patient want to make changes to medical advance directive? -  Copy of [place] in Chart? Yes - validated most recent copy scanned in chart (See row information)  Would patient like information on creating a medical advance directive? -  Pre-existing out of facility DNR order (yellow form or pink MOST form) Yellow form placed in chart (order not valid for inpatient use);Pink MOST form placed in chart (order not valid for inpatient use)     Chief Complaint  Patient presents with   Medical Management of Chronic Issues    HPI:  Pt is a 87 y.o. female seen today for medical management of chronic diseases.    She continues to reside on the memory care unit at [facility] due to Parkinson's dementia. Past medical history includes: aortic valve stenosis, chronic venous insufficiency, HTN, constipation, esophageal reflux, hypothyroidism, Parkinson's, skin cancer, HLD, Gilbert's syndrome, and unsteady gait.   07/20 pcr positive for covid 19. At this time she presents with a runny nose  and watery eyes. She has been placed on isolation x 10 days.   She remains non-ambulatory and moves around in wheelchair. She was max assist with transfers, but has lately required the lift due to progressive weakness.   Today, she is very pleasant. She does not follow all commands and or answer questions. MMSE 13/30 07/2020. No recent behavioral outbursts noted by staff. She is known to have some resistance to ADLS, but often re-directed easily.   No recent falls or injuries.   Recent blood pressure:   07/26- 122/76  07/25- 150/85, 132/69, 103/66  07/24- 148/83, 148/72  Recent weights:  07/01- 122.8 lbs  06/01- 126.4 lbs  05/01- 127 bs  Nurse does not report any concerns, vitals stable.    Past Medical History:  Diagnosis Date   Aortic stenosis, mild    Constipation, chronic    Gilbert syndrome    HTN (hypertension)    Hyperlipidemia    Hypothyroidism    Melanoma (HCC)    Neuropathy    Osteoporosis    Parkinson's disease ([place])    Peripheral neuropathy    Bilateral   Rheumatic fever    does not take Flu shot    Past Surgical History:  Procedure Laterality Date   ABDOMINAL HYSTERECTOMY  1963   no BSO; no abnormal PAPs   COLONOSCOPY  2000   negative; [place]  08/2014    Allergies  Allergen Reactions   Sulfonamide Derivatives     REACTION:generalized  swelling   Gabapentin Hypertension and Swelling   Sulfa Antibiotics Swelling    Outpatient Encounter Medications as of  12/19/2020  Medication Sig   acetaminophen (TYLENOL) 325 MG tablet Take 325 mg by mouth at bedtime. And q 4 prn   amLODipine (NORVASC) 5 MG tablet Take 5 mg by mouth daily.   Calcium Carbonate-Vitamin D (CALCIUM 600/VITAMIN D PO) Take 1 tablet by mouth daily.    Carbidopa-Levodopa ER (SINEMET CR) 25-100 MG tablet controlled release 2 by mouth at 6 am, 1 by mouth at 11 am, and 1 by mouth at 4 pm   Cholecalciferol (VITAMIN D) 2000 units CAPS Take 1  capsule by mouth daily.   levothyroxine (SYNTHROID, LEVOTHROID) 50 MCG tablet Take one by mouth on Tuesday and Thursday 75 mcg other days   levothyroxine (SYNTHROID, LEVOTHROID) 75 MCG tablet Take 1 by mouth on Sunday, Monday, Wednesday, Friday, and Saturday   Melatonin 5 MG CAPS Take by mouth.   Multiple Vitamin (MULTIVITAMIN) capsule Take 1 capsule by mouth daily.   omeprazole (PRILOSEC) 20 MG capsule Take 20 mg by mouth daily.    polyethylene glycol (MIRALAX / GLYCOLAX) 17 g packet Take 17 g by mouth daily.    QUEtiapine (SEROQUEL) 25 MG tablet Take 25 mg by mouth daily in the afternoon. At 4 pm   QUEtiapine (SEROQUEL) 25 MG tablet Take 12.5 mg by mouth at bedtime.   No facility-administered encounter medications on file as of 12/19/2020.    Review of Systems  Unable to perform ROS: Dementia   Immunization History  Administered Date(s) Administered   Influenza-Unspecified 03/20/2020   Moderna SARS-COV2 Booster Vaccination 04/05/2020   Moderna Sars-Covid-2 Vaccination 06/29/2019, 07/27/2019   PPD Test 04/29/2016   Tdap 08/11/2014   Pertinent  Health Maintenance Due  Topic Date Due   DEXA SCAN  Completed   INFLUENZA VACCINE  Discontinued   PNA vac Low Risk Adult  Discontinued   Fall Risk  11/16/2020 10/19/2020 03/03/2020 01/13/2019 09/16/2018  Falls in the past year? 0 Exclusion - non ambulatory 1 0 0  Number falls in past yr: 0 - 0 0 0  Comment - - - - -  Injury with Fall? 0 - 1 0 0  Comment - - - - -  Risk Factor Category  - - - - -  Risk for fall due to : History of fall(s);Impaired balance/gait Impaired balance/gait;History of fall(s) History of fall(s);Impaired balance/gait - -  Follow up Falls evaluation completed Falls evaluation completed Falls evaluation completed - -   Functional Status Survey:    Vitals:   12/19/20 1423  BP: 122/76  Pulse: 98  Resp: 16  Temp: (!) 97 F (36.1 C)  SpO2: 98%  Weight: 122 lb 12.8 oz (55.7 kg)  Height: '5\' 4"'$  (1.626 m)   Body  mass index is 21.08 kg/m. Physical Exam Vitals reviewed.  Constitutional:      General: She is not in acute distress. HENT:     Head: Normocephalic.     Right Ear: There is impacted cerumen.     Left Ear: There is impacted cerumen.     Nose: Congestion present.     Comments: clear    Mouth/Throat:     Mouth: Mucous membranes are moist.     Pharynx: No posterior oropharyngeal erythema.  Eyes:     General:        Right eye: No discharge.        Left eye: No discharge.  Cardiovascular:     Rate and Rhythm: Normal rate and regular rhythm.     Pulses: Normal pulses.  Heart sounds: Murmur heard.  Pulmonary:     Effort: Pulmonary effort is normal. No respiratory distress.     Breath sounds: Normal breath sounds. No wheezing.  Abdominal:     General: Bowel sounds are normal. There is no distension.     Palpations: Abdomen is soft.     Tenderness: There is no abdominal tenderness.  Musculoskeletal:     Cervical back: Normal range of motion.     Right lower leg: Edema present.     Left lower leg: Edema present.     Comments: Non-pitting, kyphosis to back  Lymphadenopathy:     Cervical: No cervical adenopathy.  Skin:    General: Skin is warm and dry.     Capillary Refill: Capillary refill takes less than 2 seconds.  Neurological:     General: No focal deficit present.     Mental Status: She is alert. Mental status is at baseline.     Motor: Weakness present.     Gait: Gait abnormal.     Comments: Wheelchair/lift  Psychiatric:        Mood and Affect: Mood normal.        Speech: Speech is delayed.        Behavior: Behavior normal.        Cognition and Memory: Memory is impaired.     Comments: Responds with "yes" or "no"     Labs reviewed: Recent Labs    08/07/20 0000  NA 146  K 4.0  CL 109*  CO2 26*  BUN 20  CREATININE 0.7  CALCIUM 9.2   Recent Labs    08/07/20 0000  AST 18  ALKPHOS 51  ALBUMIN 3.9   Recent Labs    08/07/20 0000  WBC 5.5  HGB 13.8   HCT 41  PLT 164   Lab Results  Component Value Date   TSH 2.06 08/07/2020   No results found for: HGBA1C Lab Results  Component Value Date   CHOL 199 08/12/2014   HDL 87 08/12/2014   LDLCALC 104 (H) 08/12/2014   LDLDIRECT 105.9 06/15/2013   TRIG 38 08/12/2014   CHOLHDL 2.3 08/12/2014    Significant Diagnostic Results in last 30 days:  No results found.  Assessment/Plan 1. COVID-19 - positive pcr 07/20 - symptoms include runny nose and watery eyes - cont isolation precautions - she has received covid series and first booster  2. 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 Parkinson's disease ([place]) - stable with sinemet 25-100 tid  4. Acquired hypothyroidism - TSH 2.06 08/07/2020 - cont levothyroxine 50 mcg T/TH and 75 mg rest of week  5. Chronic constipation - LBM today - cont miralax daily  6. Primary hypertension - controlled - cont amlodipine  7. Chronic venous insufficiency - some non-pitting edema - cont ted hose   8. Gastroesophageal reflux disease, unspecified whether esophagitis present - cont prilosec daily     Family/ staff Communication: plan discussed with nurse  Labs/tests ordered:  none

## 2020-12-28 DIAGNOSIS — Z8616 Personal history of COVID-19: Secondary | ICD-10-CM | POA: Diagnosis not present

## 2020-12-28 DIAGNOSIS — F0280 Dementia in other diseases classified elsewhere without behavioral disturbance: Secondary | ICD-10-CM | POA: Diagnosis not present

## 2020-12-28 DIAGNOSIS — E559 Vitamin D deficiency, unspecified: Secondary | ICD-10-CM | POA: Diagnosis not present

## 2020-12-28 DIAGNOSIS — I10 Essential (primary) hypertension: Secondary | ICD-10-CM | POA: Diagnosis not present

## 2020-12-28 DIAGNOSIS — K219 Gastro-esophageal reflux disease without esophagitis: Secondary | ICD-10-CM | POA: Diagnosis not present

## 2020-12-28 DIAGNOSIS — M810 Age-related osteoporosis without current pathological fracture: Secondary | ICD-10-CM | POA: Diagnosis not present

## 2020-12-28 DIAGNOSIS — E039 Hypothyroidism, unspecified: Secondary | ICD-10-CM | POA: Diagnosis not present

## 2020-12-28 DIAGNOSIS — G3183 Dementia with Lewy bodies: Secondary | ICD-10-CM | POA: Diagnosis not present

## 2020-12-28 DIAGNOSIS — E785 Hyperlipidemia, unspecified: Secondary | ICD-10-CM | POA: Diagnosis not present

## 2020-12-28 DIAGNOSIS — I350 Nonrheumatic aortic (valve) stenosis: Secondary | ICD-10-CM | POA: Diagnosis not present

## 2020-12-29 DIAGNOSIS — E785 Hyperlipidemia, unspecified: Secondary | ICD-10-CM | POA: Diagnosis not present

## 2020-12-29 DIAGNOSIS — I350 Nonrheumatic aortic (valve) stenosis: Secondary | ICD-10-CM | POA: Diagnosis not present

## 2020-12-29 DIAGNOSIS — I10 Essential (primary) hypertension: Secondary | ICD-10-CM | POA: Diagnosis not present

## 2020-12-29 DIAGNOSIS — G3183 Dementia with Lewy bodies: Secondary | ICD-10-CM | POA: Diagnosis not present

## 2020-12-29 DIAGNOSIS — Z8616 Personal history of COVID-19: Secondary | ICD-10-CM | POA: Diagnosis not present

## 2020-12-29 DIAGNOSIS — F0280 Dementia in other diseases classified elsewhere without behavioral disturbance: Secondary | ICD-10-CM | POA: Diagnosis not present

## 2021-01-04 DIAGNOSIS — I350 Nonrheumatic aortic (valve) stenosis: Secondary | ICD-10-CM | POA: Diagnosis not present

## 2021-01-04 DIAGNOSIS — E785 Hyperlipidemia, unspecified: Secondary | ICD-10-CM | POA: Diagnosis not present

## 2021-01-04 DIAGNOSIS — F0280 Dementia in other diseases classified elsewhere without behavioral disturbance: Secondary | ICD-10-CM | POA: Diagnosis not present

## 2021-01-04 DIAGNOSIS — I10 Essential (primary) hypertension: Secondary | ICD-10-CM | POA: Diagnosis not present

## 2021-01-04 DIAGNOSIS — Z8616 Personal history of COVID-19: Secondary | ICD-10-CM | POA: Diagnosis not present

## 2021-01-04 DIAGNOSIS — G3183 Dementia with Lewy bodies: Secondary | ICD-10-CM | POA: Diagnosis not present

## 2021-01-09 DIAGNOSIS — G3183 Dementia with Lewy bodies: Secondary | ICD-10-CM | POA: Diagnosis not present

## 2021-01-09 DIAGNOSIS — I350 Nonrheumatic aortic (valve) stenosis: Secondary | ICD-10-CM | POA: Diagnosis not present

## 2021-01-09 DIAGNOSIS — E785 Hyperlipidemia, unspecified: Secondary | ICD-10-CM | POA: Diagnosis not present

## 2021-01-09 DIAGNOSIS — F0280 Dementia in other diseases classified elsewhere without behavioral disturbance: Secondary | ICD-10-CM | POA: Diagnosis not present

## 2021-01-09 DIAGNOSIS — I10 Essential (primary) hypertension: Secondary | ICD-10-CM | POA: Diagnosis not present

## 2021-01-09 DIAGNOSIS — Z8616 Personal history of COVID-19: Secondary | ICD-10-CM | POA: Diagnosis not present

## 2021-01-10 DIAGNOSIS — G3183 Dementia with Lewy bodies: Secondary | ICD-10-CM | POA: Diagnosis not present

## 2021-01-10 DIAGNOSIS — I10 Essential (primary) hypertension: Secondary | ICD-10-CM | POA: Diagnosis not present

## 2021-01-10 DIAGNOSIS — I350 Nonrheumatic aortic (valve) stenosis: Secondary | ICD-10-CM | POA: Diagnosis not present

## 2021-01-10 DIAGNOSIS — Z8616 Personal history of COVID-19: Secondary | ICD-10-CM | POA: Diagnosis not present

## 2021-01-10 DIAGNOSIS — E785 Hyperlipidemia, unspecified: Secondary | ICD-10-CM | POA: Diagnosis not present

## 2021-01-10 DIAGNOSIS — F0280 Dementia in other diseases classified elsewhere without behavioral disturbance: Secondary | ICD-10-CM | POA: Diagnosis not present

## 2021-01-15 ENCOUNTER — Encounter: Payer: Self-pay | Admitting: Internal Medicine

## 2021-01-15 ENCOUNTER — Non-Acute Institutional Stay (SKILLED_NURSING_FACILITY): Payer: Medicare Other | Admitting: Internal Medicine

## 2021-01-15 DIAGNOSIS — E039 Hypothyroidism, unspecified: Secondary | ICD-10-CM | POA: Diagnosis not present

## 2021-01-15 DIAGNOSIS — F0280 Dementia in other diseases classified elsewhere without behavioral disturbance: Secondary | ICD-10-CM | POA: Diagnosis not present

## 2021-01-15 DIAGNOSIS — I350 Nonrheumatic aortic (valve) stenosis: Secondary | ICD-10-CM | POA: Diagnosis not present

## 2021-01-15 DIAGNOSIS — G609 Hereditary and idiopathic neuropathy, unspecified: Secondary | ICD-10-CM | POA: Diagnosis not present

## 2021-01-15 DIAGNOSIS — I10 Essential (primary) hypertension: Secondary | ICD-10-CM

## 2021-01-15 DIAGNOSIS — G20 Parkinson's disease: Secondary | ICD-10-CM | POA: Diagnosis not present

## 2021-01-15 NOTE — Progress Notes (Signed)
Location:   [facility] Room Number: [place] of Service:  SNF [phone]) Provider:  Veleta Miners MD  Royal Hawthorn, NP  Patient Care Team: Royal Hawthorn, NP as PCP - General (Nurse Practitioner) Jola Schmidt, MD as Consulting Physician (Ophthalmology) Tat, Eustace Quail, DO as Consulting Physician (Neurology) [place], Peter M, MD as Consulting Physician (Cardiology)  Extended Emergency Contact Information Primary Emergency Contact: [name] Address: [address]          [place] Phone: [phone] Mobile Phone: [phone] Relation: Daughter  Code Status:  DNR Hospice Goals of care: Advanced Directive information Advanced Directives 01/15/2021  Does Patient Have a Medical Advance Directive? Yes  Type of Paramedic of [place];Living will;Out of facility DNR (pink MOST or yellow form)  Does patient want to make changes to medical advance directive? No - Patient declined  Copy of [place] in Chart? Yes - validated most recent copy scanned in chart (See row information)  Would patient like information on creating a medical advance directive? -  Pre-existing out of facility DNR order (yellow form or pink MOST form) Yellow form placed in chart (order not valid for inpatient use);Pink MOST form placed in chart (order not valid for inpatient use)     Chief Complaint  Patient presents with   Medical Management of Chronic Issues   Quality Metric Gaps    Shingrix, #4 Covid     HPI:  Pt is a 87 y.o. female seen today for medical management of chronic diseases.    Patient has h/o AS, H/o Parkinson With Dementia and Behavior issues with Lutricia Horsfall, h/o Neuropathy HTN, HLD, Hypothyroid, Gilbert Syndrome Also Recent Covid infection Also Admitted in Hospice  Patient is doing well in Memory unit Her Behaviors are stable on Seroquel. Was very pleasant But repeats  herself Denied any issues.  No New Nursing issues No Falls. Weight is stable Past Medical History:  Diagnosis Date   Aortic stenosis, mild    Constipation, chronic    Gilbert syndrome    HTN (hypertension)    Hyperlipidemia    Hypothyroidism    Melanoma ([place])    Neuropathy    Osteoporosis    Parkinson's disease ([place])    Peripheral neuropathy    Bilateral   Rheumatic fever    does not take Flu shot    Past Surgical History:  Procedure Laterality Date   ABDOMINAL HYSTERECTOMY  1963   no BSO; no abnormal PAPs   COLONOSCOPY  2000   negative; [place]  08/2014    Allergies  Allergen Reactions   Sulfonamide Derivatives     REACTION:generalized  swelling   Gabapentin Hypertension and Swelling   Sulfa Antibiotics Swelling    Allergies as of 01/15/2021       Reactions   Sulfonamide Derivatives    REACTION:generalized  swelling   Gabapentin Hypertension, Swelling   Sulfa Antibiotics Swelling        Medication List        Accurate as of January 15, 2021 11:27 AM. If you have any questions, ask your nurse or doctor.          STOP taking these medications    multivitamin capsule Stopped by: Virgie Dad, MD       TAKE these medications    acetaminophen 325 MG tablet Commonly known as: TYLENOL Take 325 mg  by mouth at bedtime. And q 4 prn   amLODipine 5 MG tablet Commonly known as: NORVASC Take 5 mg by mouth daily.   CALCIUM 600/VITAMIN D PO Take 1 tablet by mouth daily.   Carbidopa-Levodopa ER 25-100 MG tablet controlled release Commonly known as: SINEMET CR 2 by mouth at 6 am, 1 by mouth at 11 am, and 1 by mouth at 4 pm   levothyroxine 50 MCG tablet Commonly known as: SYNTHROID Take one by mouth on Tuesday and Thursday 75 mcg other days   levothyroxine 75 MCG tablet Commonly known as: SYNTHROID Take 1 by mouth on Sunday, Monday, Wednesday, Friday, and Saturday   Melatonin 5 MG Caps Take by  mouth.   omeprazole 20 MG capsule Commonly known as: PRILOSEC Take 20 mg by mouth daily.   polyethylene glycol 17 g packet Commonly known as: MIRALAX / GLYCOLAX Take 17 g by mouth daily.   QUEtiapine 25 MG tablet Commonly known as: SEROQUEL Take 25 mg by mouth daily in the afternoon. At 4 pm   QUEtiapine 25 MG tablet Commonly known as: SEROQUEL Take 12.5 mg by mouth at bedtime.   Vitamin D 50 MCG (2000 UT) Caps Take 1 capsule by mouth daily.        Review of Systems  Unable to perform ROS: Dementia  Denies everything. No New issues by Nurses  Immunization History  Administered Date(s) Administered   Influenza-Unspecified 03/20/2020   Moderna SARS-COV2 Booster Vaccination 04/05/2020   Moderna Sars-Covid-2 Vaccination 06/29/2019, 07/27/2019   PPD Test 04/29/2016   Tdap 08/11/2014   Pertinent  Health Maintenance Due  Topic Date Due   DEXA SCAN  Completed   INFLUENZA VACCINE  Discontinued   PNA vac Low Risk Adult  Discontinued   Fall Risk  11/16/2020 10/19/2020 03/03/2020 01/13/2019 09/16/2018  Falls in the past year? 0 Exclusion - non ambulatory 1 0 0  Number falls in past yr: 0 - 0 0 0  Comment - - - - -  Injury with Fall? 0 - 1 0 0  Comment - - - - -  Risk Factor Category  - - - - -  Risk for fall due to : History of fall(s);Impaired balance/gait Impaired balance/gait;History of fall(s) History of fall(s);Impaired balance/gait - -  Follow up Falls evaluation completed Falls evaluation completed Falls evaluation completed - -   Functional Status Survey:    Vitals:   01/15/21 1055  BP: 120/76  Pulse: 69  Resp: 18  Temp: 97.8 F (36.6 C)  SpO2: 98%  Weight: 120 lb 12.8 oz (54.8 kg)  Height: '5\' 4"'$  (1.626 m)   Body mass index is 20.74 kg/m. Physical Exam Vitals reviewed.  Constitutional:      Appearance: Normal appearance.  HENT:     Head: Normocephalic.     Nose: Nose normal.     Mouth/Throat:     Mouth: Mucous membranes are moist.     Pharynx:  Oropharynx is clear.  Eyes:     Pupils: Pupils are equal, round, and reactive to light.  Cardiovascular:     Rate and Rhythm: Normal rate and regular rhythm.     Pulses: Normal pulses.     Heart sounds: Murmur heard.  Pulmonary:     Effort: Pulmonary effort is normal.     Breath sounds: Normal breath sounds.  Abdominal:     General: Abdomen is flat. Bowel sounds are normal.     Palpations: Abdomen is soft.  Musculoskeletal:  General: No swelling.     Cervical back: Neck supple.  Neurological:     General: No focal deficit present.     Mental Status: She is alert.  Psychiatric:        Mood and Affect: Mood normal.        Thought Content: Thought content normal.    Labs reviewed: Recent Labs    08/07/20 0000  NA 146  K 4.0  CL 109*  CO2 26*  BUN 20  CREATININE 0.7  CALCIUM 9.2   Recent Labs    08/07/20 0000  AST 18  ALKPHOS 51  ALBUMIN 3.9   Recent Labs    08/07/20 0000  WBC 5.5  HGB 13.8  HCT 41  PLT 164   Lab Results  Component Value Date   TSH 2.06 08/07/2020   No results found for: HGBA1C Lab Results  Component Value Date   CHOL 199 08/12/2014   HDL 87 08/12/2014   LDLCALC 104 (H) 08/12/2014   LDLDIRECT 105.9 06/15/2013   TRIG 38 08/12/2014   CHOLHDL 2.3 08/12/2014    Significant Diagnostic Results in last 30 days:  No results found.  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

## 2021-01-16 DIAGNOSIS — F0280 Dementia in other diseases classified elsewhere without behavioral disturbance: Secondary | ICD-10-CM | POA: Diagnosis not present

## 2021-01-16 DIAGNOSIS — E785 Hyperlipidemia, unspecified: Secondary | ICD-10-CM | POA: Diagnosis not present

## 2021-01-16 DIAGNOSIS — Z8616 Personal history of COVID-19: Secondary | ICD-10-CM | POA: Diagnosis not present

## 2021-01-16 DIAGNOSIS — I350 Nonrheumatic aortic (valve) stenosis: Secondary | ICD-10-CM | POA: Diagnosis not present

## 2021-01-16 DIAGNOSIS — I10 Essential (primary) hypertension: Secondary | ICD-10-CM | POA: Diagnosis not present

## 2021-01-16 DIAGNOSIS — G3183 Dementia with Lewy bodies: Secondary | ICD-10-CM | POA: Diagnosis not present

## 2021-01-17 DIAGNOSIS — Z8616 Personal history of COVID-19: Secondary | ICD-10-CM | POA: Diagnosis not present

## 2021-01-17 DIAGNOSIS — E785 Hyperlipidemia, unspecified: Secondary | ICD-10-CM | POA: Diagnosis not present

## 2021-01-17 DIAGNOSIS — F0280 Dementia in other diseases classified elsewhere without behavioral disturbance: Secondary | ICD-10-CM | POA: Diagnosis not present

## 2021-01-17 DIAGNOSIS — I350 Nonrheumatic aortic (valve) stenosis: Secondary | ICD-10-CM | POA: Diagnosis not present

## 2021-01-17 DIAGNOSIS — I10 Essential (primary) hypertension: Secondary | ICD-10-CM | POA: Diagnosis not present

## 2021-01-17 DIAGNOSIS — G3183 Dementia with Lewy bodies: Secondary | ICD-10-CM | POA: Diagnosis not present

## 2021-01-22 DIAGNOSIS — I10 Essential (primary) hypertension: Secondary | ICD-10-CM | POA: Diagnosis not present

## 2021-01-22 DIAGNOSIS — Z8616 Personal history of COVID-19: Secondary | ICD-10-CM | POA: Diagnosis not present

## 2021-01-22 DIAGNOSIS — F0280 Dementia in other diseases classified elsewhere without behavioral disturbance: Secondary | ICD-10-CM | POA: Diagnosis not present

## 2021-01-22 DIAGNOSIS — E785 Hyperlipidemia, unspecified: Secondary | ICD-10-CM | POA: Diagnosis not present

## 2021-01-22 DIAGNOSIS — G3183 Dementia with Lewy bodies: Secondary | ICD-10-CM | POA: Diagnosis not present

## 2021-01-22 DIAGNOSIS — I350 Nonrheumatic aortic (valve) stenosis: Secondary | ICD-10-CM | POA: Diagnosis not present

## 2021-01-25 DIAGNOSIS — E039 Hypothyroidism, unspecified: Secondary | ICD-10-CM | POA: Diagnosis not present

## 2021-01-25 DIAGNOSIS — Z8616 Personal history of COVID-19: Secondary | ICD-10-CM | POA: Diagnosis not present

## 2021-01-25 DIAGNOSIS — E785 Hyperlipidemia, unspecified: Secondary | ICD-10-CM | POA: Diagnosis not present

## 2021-01-25 DIAGNOSIS — I10 Essential (primary) hypertension: Secondary | ICD-10-CM | POA: Diagnosis not present

## 2021-01-25 DIAGNOSIS — F0280 Dementia in other diseases classified elsewhere without behavioral disturbance: Secondary | ICD-10-CM | POA: Diagnosis not present

## 2021-01-25 DIAGNOSIS — M810 Age-related osteoporosis without current pathological fracture: Secondary | ICD-10-CM | POA: Diagnosis not present

## 2021-01-25 DIAGNOSIS — K219 Gastro-esophageal reflux disease without esophagitis: Secondary | ICD-10-CM | POA: Diagnosis not present

## 2021-01-25 DIAGNOSIS — E559 Vitamin D deficiency, unspecified: Secondary | ICD-10-CM | POA: Diagnosis not present

## 2021-01-25 DIAGNOSIS — I350 Nonrheumatic aortic (valve) stenosis: Secondary | ICD-10-CM | POA: Diagnosis not present

## 2021-01-25 DIAGNOSIS — G3183 Dementia with Lewy bodies: Secondary | ICD-10-CM | POA: Diagnosis not present

## 2021-01-30 ENCOUNTER — Non-Acute Institutional Stay (SKILLED_NURSING_FACILITY): Payer: Medicare Other | Admitting: Orthopedic Surgery

## 2021-01-30 ENCOUNTER — Encounter: Payer: Self-pay | Admitting: Orthopedic Surgery

## 2021-01-30 DIAGNOSIS — G20 Parkinson's disease: Secondary | ICD-10-CM | POA: Diagnosis not present

## 2021-01-30 DIAGNOSIS — F0280 Dementia in other diseases classified elsewhere without behavioral disturbance: Secondary | ICD-10-CM

## 2021-01-30 DIAGNOSIS — F419 Anxiety disorder, unspecified: Secondary | ICD-10-CM

## 2021-01-30 NOTE — Progress Notes (Signed)
Location:   [facility] Room Number: 107 Place of Service:  SNF (31) Provider:  Windell Moulding, NP    Patient Care Team: Royal Hawthorn, NP as PCP - General (Nurse Practitioner) Jola Schmidt, MD as Consulting Physician (Ophthalmology) Tat, Eustace Quail, DO as Consulting Physician (Neurology) [place], Peter M, MD as Consulting Physician (Cardiology)  Extended Emergency Contact Information Primary Emergency Contact: [name] Address: [address]          [place] Phone: [phone] Mobile Phone: [phone] Relation: Daughter  Code Status:  DNR Goals of care: Advanced Directive information Advanced Directives 01/30/2021  Does Patient Have a Medical Advance Directive? Yes  Type of Paramedic of [place];Living will;Out of facility DNR (pink MOST or yellow form)  Does patient want to make changes to medical advance directive? No - Patient declined  Copy of [place] in Chart? Yes - validated most recent copy scanned in chart (See row information)  Would patient like information on creating a medical advance directive? -  Pre-existing out of facility DNR order (yellow form or pink MOST form) Yellow form placed in chart (order not valid for inpatient use);Pink MOST form placed in chart (order not valid for inpatient use)     Chief Complaint  Patient presents with   Acute Visit    Patient presents for increased behavioral outbursts.     HPI:  Pt is a 87 y.o. female seen today for an acute visit for increased behavioral outbursts.   12/21/2020 she was enrolled in hospice due to declining health related to parkinson's and dementia.   09/02 she was transferred from memory care unit to skilled nursing unit. Staff reports increased anxiety and behavioral outbursts since move. She has also refused to eat at times. Nursing staff has observed her yelling at staff and crying asking  to go back to her room. 09/05 she was observed using the hallway rails and trying to pull herself off the unit in her wheelchair. Ativan 0.25 mg po bid prn initiated by on call provider 09/03 with no success.   No recent falls or injuries. Uses wheelchair to ambulate.    Past Medical History:  Diagnosis Date   Aortic stenosis, mild    Constipation, chronic    Gilbert syndrome    HTN (hypertension)    Hyperlipidemia    Hypothyroidism    Melanoma ([place])    Neuropathy    Osteoporosis    Parkinson's disease ([place])    Peripheral neuropathy    Bilateral   Rheumatic fever    does not take Flu shot    Past Surgical History:  Procedure Laterality Date   ABDOMINAL HYSTERECTOMY  1963   no BSO; no abnormal PAPs   COLONOSCOPY  2000   negative; [place]  08/2014    Allergies  Allergen Reactions   Sulfonamide Derivatives     REACTION:generalized  swelling   Gabapentin Hypertension and Swelling   Sulfa Antibiotics Swelling    Allergies as of 01/30/2021       Reactions   Sulfonamide Derivatives    REACTION:generalized  swelling   Gabapentin Hypertension, Swelling   Sulfa Antibiotics Swelling        Medication List        Accurate as of January 30, 2021 10:57 AM. If you have any questions, ask your nurse or doctor.  acetaminophen 325 MG tablet Commonly known as: TYLENOL Take 325 mg by mouth at bedtime. And q 4 prn   amLODipine 5 MG tablet Commonly known as: NORVASC Take 5 mg by mouth daily.   CALCIUM 600/VITAMIN D PO Take 1 tablet by mouth daily.   Carbidopa-Levodopa ER 25-100 MG tablet controlled release Commonly known as: SINEMET CR 2 by mouth at 6 am, 1 by mouth at 11 am, and 1 by mouth at 4 pm   levothyroxine 50 MCG tablet Commonly known as: SYNTHROID Take one by mouth on Tuesday and Thursday 75 mcg other days   levothyroxine 75 MCG tablet Commonly known as: SYNTHROID Take 1 by mouth on Sunday,  Monday, Wednesday, Friday, and Saturday   LORazepam 0.5 MG tablet Commonly known as: ATIVAN Take 0.25 mg by mouth daily as needed for anxiety.   Melatonin 5 MG Caps Take by mouth.   omeprazole 20 MG capsule Commonly known as: PRILOSEC Take 20 mg by mouth daily.   polyethylene glycol 17 g packet Commonly known as: MIRALAX / GLYCOLAX Take 17 g by mouth daily.   QUEtiapine 25 MG tablet Commonly known as: SEROQUEL Take 25 mg by mouth daily in the afternoon. At 4 pm   QUEtiapine 25 MG tablet Commonly known as: SEROQUEL Take 12.5 mg by mouth at bedtime.   Vitamin D 50 MCG (2000 UT) Caps Take 1 capsule by mouth daily.        Review of Systems  Unable to perform ROS: Dementia   Immunization History  Administered Date(s) Administered   Influenza-Unspecified 03/20/2020   Moderna SARS-COV2 Booster Vaccination 04/05/2020   Moderna Sars-Covid-2 Vaccination 06/29/2019, 07/27/2019   PPD Test 04/29/2016   Tdap 08/11/2014   Pertinent  Health Maintenance Due  Topic Date Due   DEXA SCAN  Completed   INFLUENZA VACCINE  Discontinued   PNA vac Low Risk Adult  Discontinued   Fall Risk  11/16/2020 10/19/2020 03/03/2020 01/13/2019 09/16/2018  Falls in the past year? 0 Exclusion - non ambulatory 1 0 0  Number falls in past yr: 0 - 0 0 0  Comment - - - - -  Injury with Fall? 0 - 1 0 0  Comment - - - - -  Risk Factor Category  - - - - -  Risk for fall due to : History of fall(s);Impaired balance/gait Impaired balance/gait;History of fall(s) History of fall(s);Impaired balance/gait - -  Follow up Falls evaluation completed Falls evaluation completed Falls evaluation completed - -   Functional Status Survey:    Vitals:   01/30/21 1048  BP: 120/76  Pulse: 69  Resp: 18  Temp: (!) 97.2 F (36.2 C)  SpO2: 98%  Weight: 123 lb 9.6 oz (56.1 kg)  Height: '5\' 4"'$  (1.626 m)   Body mass index is 21.22 kg/m. Physical Exam Vitals reviewed.  Constitutional:      General: She is not in  acute distress. Cardiovascular:     Rate and Rhythm: Normal rate and regular rhythm.     Pulses: Normal pulses.     Heart sounds: Normal heart sounds. No murmur heard. Pulmonary:     Effort: Pulmonary effort is normal. No respiratory distress.     Breath sounds: Normal breath sounds. No wheezing.  Abdominal:     General: Bowel sounds are normal. There is no distension.     Palpations: Abdomen is soft.     Tenderness: There is no abdominal tenderness.  Skin:    General: Skin is warm and  dry.     Capillary Refill: Capillary refill takes less than 2 seconds.  Neurological:     General: No focal deficit present.     Mental Status: She is alert. Mental status is at baseline.     Motor: Weakness present.     Gait: Gait abnormal.     Comments: wheelchair  Psychiatric:        Attention and Perception: Attention normal.        Mood and Affect: Mood is anxious.        Cognition and Memory: Memory is impaired.     Comments: Repeats herself often during encounter    Labs reviewed: Recent Labs    08/07/20 0000  NA 146  K 4.0  CL 109*  CO2 26*  BUN 20  CREATININE 0.7  CALCIUM 9.2   Recent Labs    08/07/20 0000  AST 18  ALKPHOS 51  ALBUMIN 3.9   Recent Labs    08/07/20 0000  WBC 5.5  HGB 13.8  HCT 41  PLT 164   Lab Results  Component Value Date   TSH 2.06 08/07/2020   No results found for: HGBA1C Lab Results  Component Value Date   CHOL 199 08/12/2014   HDL 87 08/12/2014   LDLCALC 104 (H) 08/12/2014   LDLDIRECT 105.9 06/15/2013   TRIG 38 08/12/2014   CHOLHDL 2.3 08/12/2014    Significant Diagnostic Results in last 30 days:  No results found.  Assessment/Plan: 1. 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 Anxiety - increased anxiety and crying since move to SNF - will trial ativan 0.5 mg po bid prn - please report ativan usage after 14 days   Family/ staff Communication: plan discussed with patient and nurse  Labs/tests ordered:  none

## 2021-02-01 DIAGNOSIS — G3183 Dementia with Lewy bodies: Secondary | ICD-10-CM | POA: Diagnosis not present

## 2021-02-01 DIAGNOSIS — Z8616 Personal history of COVID-19: Secondary | ICD-10-CM | POA: Diagnosis not present

## 2021-02-01 DIAGNOSIS — F0280 Dementia in other diseases classified elsewhere without behavioral disturbance: Secondary | ICD-10-CM | POA: Diagnosis not present

## 2021-02-01 DIAGNOSIS — I10 Essential (primary) hypertension: Secondary | ICD-10-CM | POA: Diagnosis not present

## 2021-02-01 DIAGNOSIS — E785 Hyperlipidemia, unspecified: Secondary | ICD-10-CM | POA: Diagnosis not present

## 2021-02-01 DIAGNOSIS — I350 Nonrheumatic aortic (valve) stenosis: Secondary | ICD-10-CM | POA: Diagnosis not present

## 2021-02-02 DIAGNOSIS — F0280 Dementia in other diseases classified elsewhere without behavioral disturbance: Secondary | ICD-10-CM | POA: Diagnosis not present

## 2021-02-02 DIAGNOSIS — Z8616 Personal history of COVID-19: Secondary | ICD-10-CM | POA: Diagnosis not present

## 2021-02-02 DIAGNOSIS — G3183 Dementia with Lewy bodies: Secondary | ICD-10-CM | POA: Diagnosis not present

## 2021-02-02 DIAGNOSIS — I350 Nonrheumatic aortic (valve) stenosis: Secondary | ICD-10-CM | POA: Diagnosis not present

## 2021-02-02 DIAGNOSIS — I10 Essential (primary) hypertension: Secondary | ICD-10-CM | POA: Diagnosis not present

## 2021-02-02 DIAGNOSIS — E785 Hyperlipidemia, unspecified: Secondary | ICD-10-CM | POA: Diagnosis not present

## 2021-02-06 DIAGNOSIS — L602 Onychogryphosis: Secondary | ICD-10-CM | POA: Diagnosis not present

## 2021-02-06 DIAGNOSIS — Q6689 Other  specified congenital deformities of feet: Secondary | ICD-10-CM | POA: Diagnosis not present

## 2021-02-07 DIAGNOSIS — I350 Nonrheumatic aortic (valve) stenosis: Secondary | ICD-10-CM | POA: Diagnosis not present

## 2021-02-07 DIAGNOSIS — F0280 Dementia in other diseases classified elsewhere without behavioral disturbance: Secondary | ICD-10-CM | POA: Diagnosis not present

## 2021-02-07 DIAGNOSIS — Z8616 Personal history of COVID-19: Secondary | ICD-10-CM | POA: Diagnosis not present

## 2021-02-07 DIAGNOSIS — G3183 Dementia with Lewy bodies: Secondary | ICD-10-CM | POA: Diagnosis not present

## 2021-02-07 DIAGNOSIS — E785 Hyperlipidemia, unspecified: Secondary | ICD-10-CM | POA: Diagnosis not present

## 2021-02-07 DIAGNOSIS — I10 Essential (primary) hypertension: Secondary | ICD-10-CM | POA: Diagnosis not present

## 2021-02-12 DIAGNOSIS — I350 Nonrheumatic aortic (valve) stenosis: Secondary | ICD-10-CM | POA: Diagnosis not present

## 2021-02-12 DIAGNOSIS — Z8616 Personal history of COVID-19: Secondary | ICD-10-CM | POA: Diagnosis not present

## 2021-02-12 DIAGNOSIS — E785 Hyperlipidemia, unspecified: Secondary | ICD-10-CM | POA: Diagnosis not present

## 2021-02-12 DIAGNOSIS — G3183 Dementia with Lewy bodies: Secondary | ICD-10-CM | POA: Diagnosis not present

## 2021-02-12 DIAGNOSIS — I10 Essential (primary) hypertension: Secondary | ICD-10-CM | POA: Diagnosis not present

## 2021-02-12 DIAGNOSIS — F0280 Dementia in other diseases classified elsewhere without behavioral disturbance: Secondary | ICD-10-CM | POA: Diagnosis not present

## 2021-02-13 DIAGNOSIS — F0280 Dementia in other diseases classified elsewhere without behavioral disturbance: Secondary | ICD-10-CM | POA: Diagnosis not present

## 2021-02-13 DIAGNOSIS — G3183 Dementia with Lewy bodies: Secondary | ICD-10-CM | POA: Diagnosis not present

## 2021-02-13 DIAGNOSIS — I350 Nonrheumatic aortic (valve) stenosis: Secondary | ICD-10-CM | POA: Diagnosis not present

## 2021-02-13 DIAGNOSIS — I10 Essential (primary) hypertension: Secondary | ICD-10-CM | POA: Diagnosis not present

## 2021-02-13 DIAGNOSIS — Z8616 Personal history of COVID-19: Secondary | ICD-10-CM | POA: Diagnosis not present

## 2021-02-13 DIAGNOSIS — E785 Hyperlipidemia, unspecified: Secondary | ICD-10-CM | POA: Diagnosis not present

## 2021-02-14 DIAGNOSIS — I350 Nonrheumatic aortic (valve) stenosis: Secondary | ICD-10-CM | POA: Diagnosis not present

## 2021-02-14 DIAGNOSIS — E785 Hyperlipidemia, unspecified: Secondary | ICD-10-CM | POA: Diagnosis not present

## 2021-02-14 DIAGNOSIS — G3183 Dementia with Lewy bodies: Secondary | ICD-10-CM | POA: Diagnosis not present

## 2021-02-14 DIAGNOSIS — Z8616 Personal history of COVID-19: Secondary | ICD-10-CM | POA: Diagnosis not present

## 2021-02-14 DIAGNOSIS — I10 Essential (primary) hypertension: Secondary | ICD-10-CM | POA: Diagnosis not present

## 2021-02-14 DIAGNOSIS — F0280 Dementia in other diseases classified elsewhere without behavioral disturbance: Secondary | ICD-10-CM | POA: Diagnosis not present

## 2021-02-21 DIAGNOSIS — G3183 Dementia with Lewy bodies: Secondary | ICD-10-CM | POA: Diagnosis not present

## 2021-02-21 DIAGNOSIS — I350 Nonrheumatic aortic (valve) stenosis: Secondary | ICD-10-CM | POA: Diagnosis not present

## 2021-02-21 DIAGNOSIS — Z8616 Personal history of COVID-19: Secondary | ICD-10-CM | POA: Diagnosis not present

## 2021-02-21 DIAGNOSIS — E785 Hyperlipidemia, unspecified: Secondary | ICD-10-CM | POA: Diagnosis not present

## 2021-02-21 DIAGNOSIS — I10 Essential (primary) hypertension: Secondary | ICD-10-CM | POA: Diagnosis not present

## 2021-02-21 DIAGNOSIS — F0280 Dementia in other diseases classified elsewhere without behavioral disturbance: Secondary | ICD-10-CM | POA: Diagnosis not present

## 2021-02-23 DIAGNOSIS — G3183 Dementia with Lewy bodies: Secondary | ICD-10-CM | POA: Diagnosis not present

## 2021-02-23 DIAGNOSIS — I10 Essential (primary) hypertension: Secondary | ICD-10-CM | POA: Diagnosis not present

## 2021-02-23 DIAGNOSIS — F0280 Dementia in other diseases classified elsewhere without behavioral disturbance: Secondary | ICD-10-CM | POA: Diagnosis not present

## 2021-02-23 DIAGNOSIS — I350 Nonrheumatic aortic (valve) stenosis: Secondary | ICD-10-CM | POA: Diagnosis not present

## 2021-02-23 DIAGNOSIS — Z8616 Personal history of COVID-19: Secondary | ICD-10-CM | POA: Diagnosis not present

## 2021-02-23 DIAGNOSIS — E785 Hyperlipidemia, unspecified: Secondary | ICD-10-CM | POA: Diagnosis not present

## 2021-02-24 DIAGNOSIS — K219 Gastro-esophageal reflux disease without esophagitis: Secondary | ICD-10-CM | POA: Diagnosis not present

## 2021-02-24 DIAGNOSIS — G3183 Dementia with Lewy bodies: Secondary | ICD-10-CM | POA: Diagnosis not present

## 2021-02-24 DIAGNOSIS — F0280 Dementia in other diseases classified elsewhere without behavioral disturbance: Secondary | ICD-10-CM | POA: Diagnosis not present

## 2021-02-24 DIAGNOSIS — E559 Vitamin D deficiency, unspecified: Secondary | ICD-10-CM | POA: Diagnosis not present

## 2021-02-24 DIAGNOSIS — M810 Age-related osteoporosis without current pathological fracture: Secondary | ICD-10-CM | POA: Diagnosis not present

## 2021-02-24 DIAGNOSIS — Z8616 Personal history of COVID-19: Secondary | ICD-10-CM | POA: Diagnosis not present

## 2021-02-24 DIAGNOSIS — I10 Essential (primary) hypertension: Secondary | ICD-10-CM | POA: Diagnosis not present

## 2021-02-24 DIAGNOSIS — E785 Hyperlipidemia, unspecified: Secondary | ICD-10-CM | POA: Diagnosis not present

## 2021-02-24 DIAGNOSIS — E039 Hypothyroidism, unspecified: Secondary | ICD-10-CM | POA: Diagnosis not present

## 2021-02-24 DIAGNOSIS — I350 Nonrheumatic aortic (valve) stenosis: Secondary | ICD-10-CM | POA: Diagnosis not present

## 2021-02-26 ENCOUNTER — Non-Acute Institutional Stay (SKILLED_NURSING_FACILITY): Payer: Medicare Other | Admitting: Adult Health

## 2021-02-26 ENCOUNTER — Encounter: Payer: Self-pay | Admitting: Adult Health

## 2021-02-26 DIAGNOSIS — F0280 Dementia in other diseases classified elsewhere without behavioral disturbance: Secondary | ICD-10-CM | POA: Diagnosis not present

## 2021-02-26 DIAGNOSIS — E039 Hypothyroidism, unspecified: Secondary | ICD-10-CM

## 2021-02-26 DIAGNOSIS — G20 Parkinson's disease: Secondary | ICD-10-CM

## 2021-02-26 DIAGNOSIS — I872 Venous insufficiency (chronic) (peripheral): Secondary | ICD-10-CM

## 2021-02-26 DIAGNOSIS — Z8616 Personal history of COVID-19: Secondary | ICD-10-CM | POA: Diagnosis not present

## 2021-02-26 DIAGNOSIS — E785 Hyperlipidemia, unspecified: Secondary | ICD-10-CM | POA: Diagnosis not present

## 2021-02-26 DIAGNOSIS — K219 Gastro-esophageal reflux disease without esophagitis: Secondary | ICD-10-CM | POA: Diagnosis not present

## 2021-02-26 DIAGNOSIS — I350 Nonrheumatic aortic (valve) stenosis: Secondary | ICD-10-CM | POA: Diagnosis not present

## 2021-02-26 DIAGNOSIS — K5909 Other constipation: Secondary | ICD-10-CM | POA: Diagnosis not present

## 2021-02-26 DIAGNOSIS — I10 Essential (primary) hypertension: Secondary | ICD-10-CM | POA: Diagnosis not present

## 2021-02-26 DIAGNOSIS — G3183 Dementia with Lewy bodies: Secondary | ICD-10-CM | POA: Diagnosis not present

## 2021-02-26 NOTE — Progress Notes (Signed)
Location:  [facility] of Service:  SNF (31) Provider:   Cindi Carbon, [place] [phone]   Royal Hawthorn, NP  Patient Care Team: Royal Hawthorn, NP as PCP - General (Nurse Practitioner) Jola Schmidt, MD as Consulting Physician (Ophthalmology) Tat, Eustace Quail, DO as Consulting Physician (Neurology) Martinique, Peter M, MD as Consulting Physician (Cardiology)  Extended Emergency Contact Information Primary Emergency Contact: Pettie,Tonya Address: [address]          [place], [place] [address] Johnnette Litter of [place] Phone: [phone] Mobile Phone: [phone] Relation: Daughter  Code Status:  DNR Goals of care: Advanced Directive information Advanced Directives 01/30/2021  Does Patient Have a Medical Advance Directive? Yes  Type of Paramedic of [place];Living will;Out of facility DNR (pink MOST or yellow form)  Does patient want to make changes to medical advance directive? No - Patient declined  Copy of [place] in Chart? Yes - validated most recent copy scanned in chart (See row information)  Would patient like information on creating a medical advance directive? -  Pre-existing out of facility DNR order (yellow form or pink MOST form) Yellow form placed in chart (order not valid for inpatient use);Pink MOST form placed in chart (order not valid for inpatient use)     Chief Complaint  Patient presents with   Medical Management of Chronic Issues    HPI:  Pt is a 87 y.o. female seen today for medical management of chronic diseases.   PMH significant for AS, constipation, PD, dementia, gilbert syndrome, HTN, venous insuff, HLD, and neuropathy.  She has been dealing with some agitation since moving from memory care to skilled care. She is followed by hospice for dementia. They are using Haldol as needed. Seroquel dosage was also increased in the past month. She too  sleeping with scheduled haldol so this was discontinued. The nurse reports mild improvement in her symptoms. She does continue to have anxiety asking for help, asking where she is and where her family is located.   Her weight is fairly stable. No increase in edema or sob.  No issues with chewing or swallowing. She continues to feed herself. Appetite is adequate.  Wt Readings from Last 3 Encounters:  02/26/21 122 lb 8 oz (55.6 kg)  01/30/21 123 lb 9.6 oz (56.1 kg)  01/15/21 120 lb 12.8 oz (54.8 kg)     Past Medical History:  Diagnosis Date   Aortic stenosis, mild    Constipation, chronic    Gilbert syndrome    HTN (hypertension)    Hyperlipidemia    Hypothyroidism    Melanoma ([place])    Neuropathy    Osteoporosis    Parkinson's disease ([place])    Peripheral neuropathy    Bilateral   Rheumatic fever    does not take Flu shot    Past Surgical History:  Procedure Laterality Date   ABDOMINAL HYSTERECTOMY  1963   no BSO; no abnormal PAPs   COLONOSCOPY  2000   negative; [place]  08/2014    Allergies  Allergen Reactions   Sulfonamide Derivatives     REACTION:generalized  swelling   Gabapentin Hypertension and Swelling   Sulfa Antibiotics Swelling    Outpatient Encounter Medications as of 02/26/2021  Medication Sig   haloperidol (HALDOL) 0.5 MG tablet Take 0.5 mg by mouth every 6 (six) hours as needed for agitation.  acetaminophen (TYLENOL) 325 MG tablet Take 325 mg by mouth at bedtime. And q 4 prn   amLODipine (NORVASC) 5 MG tablet Take 5 mg by mouth daily.   Calcium Carbonate-Vitamin D (CALCIUM 600/VITAMIN D PO) Take 1 tablet by mouth daily.    Carbidopa-Levodopa ER (SINEMET CR) 25-100 MG tablet controlled release 2 by mouth at 6 am, 1 by mouth at 11 am, and 1 by mouth at 4 pm   Cholecalciferol (VITAMIN D) 2000 units CAPS Take 1 capsule by mouth daily.   levothyroxine (SYNTHROID, LEVOTHROID) 50 MCG tablet Take one by mouth on  Tuesday and Thursday 75 mcg other days   levothyroxine (SYNTHROID, LEVOTHROID) 75 MCG tablet Take 1 by mouth on Sunday, Monday, Wednesday, Friday, and Saturday   Melatonin 5 MG CAPS Take by mouth.   omeprazole (PRILOSEC) 20 MG capsule Take 20 mg by mouth daily.    polyethylene glycol (MIRALAX / GLYCOLAX) 17 g packet Take 17 g by mouth daily.    QUEtiapine (SEROQUEL) 25 MG tablet Take 25 mg by mouth 2 (two) times daily.   [DISCONTINUED] LORazepam (ATIVAN) 0.5 MG tablet Take 0.25 mg by mouth daily as needed for anxiety.   [DISCONTINUED] QUEtiapine (SEROQUEL) 25 MG tablet Take 25 mg by mouth daily in the afternoon. At 4 pm   No facility-administered encounter medications on file as of 02/26/2021.    Review of Systems  Constitutional:  Negative for activity change, appetite change, chills, diaphoresis, fatigue, fever and unexpected weight change.  HENT:  Negative for congestion and trouble swallowing.   Eyes:  Negative for photophobia.  Respiratory:  Negative for cough, shortness of breath and wheezing.   Cardiovascular:  Positive for leg swelling. Negative for chest pain and palpitations.  Gastrointestinal:  Positive for constipation. Negative for abdominal distention, abdominal pain and diarrhea.  Genitourinary:  Negative for difficulty urinating and dysuria.  Musculoskeletal:  Positive for gait problem. Negative for arthralgias, back pain, joint swelling and myalgias.  Neurological:  Negative for dizziness, tremors, seizures, syncope, facial asymmetry, speech difficulty, weakness, light-headedness, numbness and headaches.  Psychiatric/Behavioral:  Positive for agitation, behavioral problems and confusion.    Immunization History  Administered Date(s) Administered   Influenza-Unspecified 03/20/2020   Moderna SARS-COV2 Booster Vaccination 04/05/2020   Moderna Sars-Covid-2 Vaccination 06/29/2019, 07/27/2019   PPD Test 04/29/2016   Tdap 08/11/2014   Pertinent  Health Maintenance Due   Topic Date Due   DEXA SCAN  Completed   INFLUENZA VACCINE  Discontinued   Fall Risk  11/16/2020 10/19/2020 03/03/2020 01/13/2019 09/16/2018  Falls in the past year? 0 Exclusion - non ambulatory 1 0 0  Number falls in past yr: 0 - 0 0 0  Comment - - - - -  Injury with Fall? 0 - 1 0 0  Comment - - - - -  Risk Factor Category  - - - - -  Risk for fall due to : History of fall(s);Impaired balance/gait Impaired balance/gait;History of fall(s) History of fall(s);Impaired balance/gait - -  Follow up Falls evaluation completed Falls evaluation completed Falls evaluation completed - -   Functional Status Survey:    Vitals:   02/26/21 1040  Weight: 122 lb 8 oz (55.6 kg)   Body mass index is 21.03 kg/m. Physical Exam Vitals and nursing note reviewed.  Constitutional:      General: She is not in acute distress.    Appearance: She is not diaphoretic.  HENT:     Head: Normocephalic and atraumatic.  Neck:  Vascular: No JVD.  Cardiovascular:     Rate and Rhythm: Normal rate and regular rhythm.     Heart sounds: Murmur heard.  Pulmonary:     Effort: Pulmonary effort is normal. No respiratory distress.     Breath sounds: Normal breath sounds. No wheezing.  Abdominal:     General: Bowel sounds are normal. There is distension.     Palpations: Abdomen is soft.     Tenderness: There is no abdominal tenderness.  Musculoskeletal:     Comments: BLE edema +1  Skin:    General: Skin is warm and dry.  Neurological:     General: No focal deficit present.     Mental Status: She is alert. Mental status is at baseline.     Cranial Nerves: No cranial nerve deficit.     Comments: Bradykinesia. Decreased in ROM to BUE and BLE.     Labs reviewed: Recent Labs    08/07/20 0000  NA 146  K 4.0  CL 109*  CO2 26*  BUN 20  CREATININE 0.7  CALCIUM 9.2   Recent Labs    08/07/20 0000  AST 18  ALKPHOS 51  ALBUMIN 3.9   Recent Labs    08/07/20 0000  WBC 5.5  HGB 13.8  HCT 41  PLT 164    Lab Results  Component Value Date   TSH 2.06 08/07/2020   No results found for: HGBA1C Lab Results  Component Value Date   CHOL 199 08/12/2014   HDL 87 08/12/2014   LDLCALC 104 (H) 08/12/2014   LDLDIRECT 105.9 06/15/2013   TRIG 38 08/12/2014   CHOLHDL 2.3 08/12/2014    Significant Diagnostic Results in last 30 days:  No results found.  Assessment/Plan 1. 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 Parkinson's disease ([place]) Progressively less mobile over time Continues on sinemet  3. Acquired hypothyroidism Continue synthroid Check TSH annually  4. Gastroesophageal reflux disease, unspecified whether esophagitis present No symptoms Continue Prilosec  5. Chronic constipation Continue miralax qd  6. Chronic venous insufficiency Continue compression hose       Labs/tests ordered:  NA

## 2021-02-27 DIAGNOSIS — Z8616 Personal history of COVID-19: Secondary | ICD-10-CM | POA: Diagnosis not present

## 2021-02-27 DIAGNOSIS — F0280 Dementia in other diseases classified elsewhere without behavioral disturbance: Secondary | ICD-10-CM | POA: Diagnosis not present

## 2021-02-27 DIAGNOSIS — I350 Nonrheumatic aortic (valve) stenosis: Secondary | ICD-10-CM | POA: Diagnosis not present

## 2021-02-27 DIAGNOSIS — I10 Essential (primary) hypertension: Secondary | ICD-10-CM | POA: Diagnosis not present

## 2021-02-27 DIAGNOSIS — G3183 Dementia with Lewy bodies: Secondary | ICD-10-CM | POA: Diagnosis not present

## 2021-02-27 DIAGNOSIS — E785 Hyperlipidemia, unspecified: Secondary | ICD-10-CM | POA: Diagnosis not present

## 2021-02-28 DIAGNOSIS — Z23 Encounter for immunization: Secondary | ICD-10-CM | POA: Diagnosis not present

## 2021-03-01 DIAGNOSIS — E785 Hyperlipidemia, unspecified: Secondary | ICD-10-CM | POA: Diagnosis not present

## 2021-03-01 DIAGNOSIS — I10 Essential (primary) hypertension: Secondary | ICD-10-CM | POA: Diagnosis not present

## 2021-03-01 DIAGNOSIS — F0280 Dementia in other diseases classified elsewhere without behavioral disturbance: Secondary | ICD-10-CM | POA: Diagnosis not present

## 2021-03-01 DIAGNOSIS — Z8616 Personal history of COVID-19: Secondary | ICD-10-CM | POA: Diagnosis not present

## 2021-03-01 DIAGNOSIS — G3183 Dementia with Lewy bodies: Secondary | ICD-10-CM | POA: Diagnosis not present

## 2021-03-01 DIAGNOSIS — I350 Nonrheumatic aortic (valve) stenosis: Secondary | ICD-10-CM | POA: Diagnosis not present

## 2021-03-05 DIAGNOSIS — Z8616 Personal history of COVID-19: Secondary | ICD-10-CM | POA: Diagnosis not present

## 2021-03-05 DIAGNOSIS — F0280 Dementia in other diseases classified elsewhere without behavioral disturbance: Secondary | ICD-10-CM | POA: Diagnosis not present

## 2021-03-05 DIAGNOSIS — I350 Nonrheumatic aortic (valve) stenosis: Secondary | ICD-10-CM | POA: Diagnosis not present

## 2021-03-05 DIAGNOSIS — I10 Essential (primary) hypertension: Secondary | ICD-10-CM | POA: Diagnosis not present

## 2021-03-05 DIAGNOSIS — E785 Hyperlipidemia, unspecified: Secondary | ICD-10-CM | POA: Diagnosis not present

## 2021-03-05 DIAGNOSIS — G3183 Dementia with Lewy bodies: Secondary | ICD-10-CM | POA: Diagnosis not present

## 2021-03-14 DIAGNOSIS — I10 Essential (primary) hypertension: Secondary | ICD-10-CM | POA: Diagnosis not present

## 2021-03-14 DIAGNOSIS — E785 Hyperlipidemia, unspecified: Secondary | ICD-10-CM | POA: Diagnosis not present

## 2021-03-14 DIAGNOSIS — F0280 Dementia in other diseases classified elsewhere without behavioral disturbance: Secondary | ICD-10-CM | POA: Diagnosis not present

## 2021-03-14 DIAGNOSIS — Z8616 Personal history of COVID-19: Secondary | ICD-10-CM | POA: Diagnosis not present

## 2021-03-14 DIAGNOSIS — G3183 Dementia with Lewy bodies: Secondary | ICD-10-CM | POA: Diagnosis not present

## 2021-03-14 DIAGNOSIS — I350 Nonrheumatic aortic (valve) stenosis: Secondary | ICD-10-CM | POA: Diagnosis not present

## 2021-03-19 DIAGNOSIS — F0280 Dementia in other diseases classified elsewhere without behavioral disturbance: Secondary | ICD-10-CM | POA: Diagnosis not present

## 2021-03-19 DIAGNOSIS — I10 Essential (primary) hypertension: Secondary | ICD-10-CM | POA: Diagnosis not present

## 2021-03-19 DIAGNOSIS — I350 Nonrheumatic aortic (valve) stenosis: Secondary | ICD-10-CM | POA: Diagnosis not present

## 2021-03-19 DIAGNOSIS — E785 Hyperlipidemia, unspecified: Secondary | ICD-10-CM | POA: Diagnosis not present

## 2021-03-19 DIAGNOSIS — Z8616 Personal history of COVID-19: Secondary | ICD-10-CM | POA: Diagnosis not present

## 2021-03-19 DIAGNOSIS — G3183 Dementia with Lewy bodies: Secondary | ICD-10-CM | POA: Diagnosis not present

## 2021-03-20 DIAGNOSIS — F0280 Dementia in other diseases classified elsewhere without behavioral disturbance: Secondary | ICD-10-CM | POA: Diagnosis not present

## 2021-03-20 DIAGNOSIS — I10 Essential (primary) hypertension: Secondary | ICD-10-CM | POA: Diagnosis not present

## 2021-03-20 DIAGNOSIS — E785 Hyperlipidemia, unspecified: Secondary | ICD-10-CM | POA: Diagnosis not present

## 2021-03-20 DIAGNOSIS — I350 Nonrheumatic aortic (valve) stenosis: Secondary | ICD-10-CM | POA: Diagnosis not present

## 2021-03-20 DIAGNOSIS — Z8616 Personal history of COVID-19: Secondary | ICD-10-CM | POA: Diagnosis not present

## 2021-03-20 DIAGNOSIS — G3183 Dementia with Lewy bodies: Secondary | ICD-10-CM | POA: Diagnosis not present

## 2021-03-27 DIAGNOSIS — G3183 Dementia with Lewy bodies: Secondary | ICD-10-CM | POA: Diagnosis not present

## 2021-03-27 DIAGNOSIS — F0280 Dementia in other diseases classified elsewhere without behavioral disturbance: Secondary | ICD-10-CM | POA: Diagnosis not present

## 2021-03-27 DIAGNOSIS — M810 Age-related osteoporosis without current pathological fracture: Secondary | ICD-10-CM | POA: Diagnosis not present

## 2021-03-27 DIAGNOSIS — E039 Hypothyroidism, unspecified: Secondary | ICD-10-CM | POA: Diagnosis not present

## 2021-03-27 DIAGNOSIS — E559 Vitamin D deficiency, unspecified: Secondary | ICD-10-CM | POA: Diagnosis not present

## 2021-03-27 DIAGNOSIS — I10 Essential (primary) hypertension: Secondary | ICD-10-CM | POA: Diagnosis not present

## 2021-03-27 DIAGNOSIS — K219 Gastro-esophageal reflux disease without esophagitis: Secondary | ICD-10-CM | POA: Diagnosis not present

## 2021-03-27 DIAGNOSIS — E785 Hyperlipidemia, unspecified: Secondary | ICD-10-CM | POA: Diagnosis not present

## 2021-03-27 DIAGNOSIS — Z8616 Personal history of COVID-19: Secondary | ICD-10-CM | POA: Diagnosis not present

## 2021-03-27 DIAGNOSIS — I350 Nonrheumatic aortic (valve) stenosis: Secondary | ICD-10-CM | POA: Diagnosis not present

## 2021-03-30 DIAGNOSIS — F0280 Dementia in other diseases classified elsewhere without behavioral disturbance: Secondary | ICD-10-CM | POA: Diagnosis not present

## 2021-03-30 DIAGNOSIS — Z8616 Personal history of COVID-19: Secondary | ICD-10-CM | POA: Diagnosis not present

## 2021-03-30 DIAGNOSIS — G3183 Dementia with Lewy bodies: Secondary | ICD-10-CM | POA: Diagnosis not present

## 2021-03-30 DIAGNOSIS — I350 Nonrheumatic aortic (valve) stenosis: Secondary | ICD-10-CM | POA: Diagnosis not present

## 2021-03-30 DIAGNOSIS — E785 Hyperlipidemia, unspecified: Secondary | ICD-10-CM | POA: Diagnosis not present

## 2021-03-30 DIAGNOSIS — I10 Essential (primary) hypertension: Secondary | ICD-10-CM | POA: Diagnosis not present

## 2021-04-01 DIAGNOSIS — I350 Nonrheumatic aortic (valve) stenosis: Secondary | ICD-10-CM | POA: Diagnosis not present

## 2021-04-01 DIAGNOSIS — E785 Hyperlipidemia, unspecified: Secondary | ICD-10-CM | POA: Diagnosis not present

## 2021-04-01 DIAGNOSIS — I10 Essential (primary) hypertension: Secondary | ICD-10-CM | POA: Diagnosis not present

## 2021-04-01 DIAGNOSIS — F0280 Dementia in other diseases classified elsewhere without behavioral disturbance: Secondary | ICD-10-CM | POA: Diagnosis not present

## 2021-04-01 DIAGNOSIS — G3183 Dementia with Lewy bodies: Secondary | ICD-10-CM | POA: Diagnosis not present

## 2021-04-01 DIAGNOSIS — Z8616 Personal history of COVID-19: Secondary | ICD-10-CM | POA: Diagnosis not present

## 2021-04-03 DIAGNOSIS — I350 Nonrheumatic aortic (valve) stenosis: Secondary | ICD-10-CM | POA: Diagnosis not present

## 2021-04-03 DIAGNOSIS — F0280 Dementia in other diseases classified elsewhere without behavioral disturbance: Secondary | ICD-10-CM | POA: Diagnosis not present

## 2021-04-03 DIAGNOSIS — Z8616 Personal history of COVID-19: Secondary | ICD-10-CM | POA: Diagnosis not present

## 2021-04-03 DIAGNOSIS — G3183 Dementia with Lewy bodies: Secondary | ICD-10-CM | POA: Diagnosis not present

## 2021-04-03 DIAGNOSIS — E785 Hyperlipidemia, unspecified: Secondary | ICD-10-CM | POA: Diagnosis not present

## 2021-04-03 DIAGNOSIS — I10 Essential (primary) hypertension: Secondary | ICD-10-CM | POA: Diagnosis not present

## 2021-04-09 ENCOUNTER — Non-Acute Institutional Stay (SKILLED_NURSING_FACILITY): Payer: Medicare Other | Admitting: Internal Medicine

## 2021-04-09 ENCOUNTER — Encounter: Payer: Self-pay | Admitting: Internal Medicine

## 2021-04-09 DIAGNOSIS — G609 Hereditary and idiopathic neuropathy, unspecified: Secondary | ICD-10-CM

## 2021-04-09 DIAGNOSIS — Z8616 Personal history of COVID-19: Secondary | ICD-10-CM | POA: Diagnosis not present

## 2021-04-09 DIAGNOSIS — F0280 Dementia in other diseases classified elsewhere without behavioral disturbance: Secondary | ICD-10-CM

## 2021-04-09 DIAGNOSIS — E785 Hyperlipidemia, unspecified: Secondary | ICD-10-CM | POA: Diagnosis not present

## 2021-04-09 DIAGNOSIS — I10 Essential (primary) hypertension: Secondary | ICD-10-CM | POA: Diagnosis not present

## 2021-04-09 DIAGNOSIS — I350 Nonrheumatic aortic (valve) stenosis: Secondary | ICD-10-CM | POA: Diagnosis not present

## 2021-04-09 DIAGNOSIS — G20 Parkinson's disease: Secondary | ICD-10-CM

## 2021-04-09 DIAGNOSIS — E039 Hypothyroidism, unspecified: Secondary | ICD-10-CM

## 2021-04-09 DIAGNOSIS — G3183 Dementia with Lewy bodies: Secondary | ICD-10-CM | POA: Diagnosis not present

## 2021-04-09 NOTE — Progress Notes (Signed)
Location:   [facility] Room Number: 107 Place of Service:  SNF [id number]) Provider:  Veleta Miners MD  Royal Hawthorn, NP  Patient Care Team: Royal Hawthorn, NP as PCP - General (Nurse Practitioner) Jola Schmidt, MD as Consulting Physician (Ophthalmology) Tat, Eustace Quail, DO as Consulting Physician (Neurology) [place], Peter M, MD as Consulting Physician (Cardiology)  Extended Emergency Contact Information Primary Emergency Contact: Saad,Tonya Address: [address]          [place], [place] [address] Johnnette Litter of [place] Phone: [phone] Mobile Phone: [phone] Relation: Daughter  Code Status:  DNR Hospice Goals of care: Advanced Directive information Advanced Directives 04/09/2021  Does Patient Have a Medical Advance Directive? Yes  Type of Advance Directive Out of facility DNR (pink MOST or yellow form);Living will  Does patient want to make changes to medical advance directive? No - Patient declined  Copy of [place] in Chart? -  Would patient like information on creating a medical advance directive? -  Pre-existing out of facility DNR order (yellow form or pink MOST form) Yellow form placed in chart (order not valid for inpatient use);Pink MOST form placed in chart (order not valid for inpatient use)     Chief Complaint  Patient presents with   Medical Management of Chronic Issues   Quality Metric Gaps    PCV, Shingrix, Covid     HPI:  Pt is a 87 y.o. female seen today for medical management of chronic diseases.    Patient has h/o AS, H/o Parkinson With Dementia and Behavior issues with Lutricia Horsfall, h/o Neuropathy HTN, HLD, Hypothyroid, Gilbert Syndrome Covid infection  Admitted in Hospice  She continues to stay stable Per nurses need more help with her Transfers and they are using Stand up Lift causing her to have some Bruises. But no Behavior issues Weight stable No Falls.    Past  Medical History:  Diagnosis Date   Aortic stenosis, mild    Constipation, chronic    Gilbert syndrome    HTN (hypertension)    Hyperlipidemia    Hypothyroidism    Melanoma ([place])    Neuropathy    Osteoporosis    Parkinson's disease ([place])    Peripheral neuropathy    Bilateral   Rheumatic fever    does not take Flu shot    Past Surgical History:  Procedure Laterality Date   ABDOMINAL HYSTERECTOMY  1963   no BSO; no abnormal PAPs   COLONOSCOPY  2000   negative; [place]  08/2014    Allergies  Allergen Reactions   Sulfonamide Derivatives     REACTION:generalized  swelling   Gabapentin Hypertension and Swelling   Sulfa Antibiotics Swelling    Allergies as of 04/09/2021       Reactions   Sulfonamide Derivatives    REACTION:generalized  swelling   Gabapentin Hypertension, Swelling   Sulfa Antibiotics Swelling        Medication List        Accurate as of April 09, 2021 11:06 AM. If you have any questions, ask your nurse or doctor.          acetaminophen 325 MG tablet Commonly known as: TYLENOL Take 325 mg by mouth at bedtime. And q 4 prn   amLODipine 5 MG tablet Commonly known as: NORVASC Take 5 mg by mouth daily.   CALCIUM 600/VITAMIN D PO Take 1 tablet by mouth daily.  Carbidopa-Levodopa ER 25-100 MG tablet controlled release Commonly known as: SINEMET CR 2 by mouth at 6 am, 1 by mouth at 11 am, and 1 by mouth at 4 pm   haloperidol 0.5 MG tablet Commonly known as: HALDOL Take 0.5 mg by mouth every 6 (six) hours as needed for agitation.   levothyroxine 50 MCG tablet Commonly known as: SYNTHROID Take one by mouth on Tuesday and Thursday 75 mcg other days   levothyroxine 75 MCG tablet Commonly known as: SYNTHROID Take 1 by mouth on Sunday, Monday, Wednesday, Friday, and Saturday   Melatonin 5 MG Caps Take by mouth.   omeprazole 20 MG capsule Commonly known as: PRILOSEC Take 20 mg by mouth  daily.   polyethylene glycol 17 g packet Commonly known as: MIRALAX / GLYCOLAX Take 17 g by mouth daily.   QUEtiapine 25 MG tablet Commonly known as: SEROQUEL Take 25 mg by mouth 2 (two) times daily.   Vitamin D 50 MCG (2000 UT) Caps Take 1 capsule by mouth daily.        Review of Systems Dementia Denies Everything. No New Issues  Immunization History  Administered Date(s) Administered   Influenza-Unspecified 03/20/2020   Moderna SARS-COV2 Booster Vaccination 04/05/2020   Moderna Sars-Covid-2 Vaccination 06/29/2019, 07/27/2019   PPD Test 04/29/2016   Tdap 08/11/2014   Pertinent  Health Maintenance Due  Topic Date Due   DEXA SCAN  Completed   INFLUENZA VACCINE  Discontinued   Fall Risk 09/16/2018 01/13/2019 03/03/2020 10/19/2020 11/16/2020  Falls in the past year? 0 0 1 Exclusion - non ambulatory 0  Number of falls in past year - - - - -  Was there an injury with Fall? 0 0 1 - 0  Was there an injury with Fall? - - - - -  Fall Risk Category Calculator 0 0 2 - 0  Fall Risk Category Low Low Moderate - Low  Patient Fall Risk Level Low fall risk Low fall risk Moderate fall risk - Moderate fall risk  Patient at Risk for Falls Due to - - History of fall(s);Impaired balance/gait Impaired balance/gait;History of fall(s) History of fall(s);Impaired balance/gait  Fall risk Follow up - - Falls evaluation completed Falls evaluation completed Falls evaluation completed   Functional Status Survey:    Vitals:   04/09/21 1100  BP: 118/71  Pulse: (!) 53  Resp: 16  Temp: (!) 97.2 F (36.2 C)  SpO2: 96%  Weight: 125 lb 3.2 oz (56.8 kg)  Height: 5\' 4"  (1.626 m)   Body mass index is 21.49 kg/m. Physical Exam Vitals reviewed.  Constitutional:      Appearance: Normal appearance.  HENT:     Head: Normocephalic.     Nose: Nose normal.     Mouth/Throat:     Mouth: Mucous membranes are moist.     Pharynx: Oropharynx is clear.  Eyes:     Pupils: Pupils are equal, round, and  reactive to light.  Cardiovascular:     Rate and Rhythm: Normal rate and regular rhythm.     Pulses: Normal pulses.     Heart sounds: Murmur heard.  Pulmonary:     Effort: Pulmonary effort is normal.     Breath sounds: Normal breath sounds.  Abdominal:     General: Abdomen is flat. Bowel sounds are normal.     Palpations: Abdomen is soft.  Musculoskeletal:        General: No swelling.     Cervical back: Neck supple.  Skin:  General: Skin is warm.  Neurological:     General: No focal deficit present.     Mental Status: She is alert.     Comments: Alert. Follows Some Commands.   Psychiatric:        Mood and Affect: Mood normal.        Thought Content: Thought content normal.    Labs reviewed: Recent Labs    08/07/20 0000  NA 146  K 4.0  CL 109*  CO2 26*  BUN 20  CREATININE 0.7  CALCIUM 9.2   Recent Labs    08/07/20 0000  AST 18  ALKPHOS 51  ALBUMIN 3.9   Recent Labs    08/07/20 0000  WBC 5.5  HGB 13.8  HCT 41  PLT 164   Lab Results  Component Value Date   TSH 2.06 08/07/2020   No results found for: HGBA1C Lab Results  Component Value Date   CHOL 199 08/12/2014   HDL 87 08/12/2014   LDLCALC 104 (H) 08/12/2014   LDLDIRECT 105.9 06/15/2013   TRIG 38 08/12/2014   CHOLHDL 2.3 08/12/2014    Significant Diagnostic Results in last 30 days:  No results found.  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

## 2021-04-13 DIAGNOSIS — I350 Nonrheumatic aortic (valve) stenosis: Secondary | ICD-10-CM | POA: Diagnosis not present

## 2021-04-13 DIAGNOSIS — Z8616 Personal history of COVID-19: Secondary | ICD-10-CM | POA: Diagnosis not present

## 2021-04-13 DIAGNOSIS — G3183 Dementia with Lewy bodies: Secondary | ICD-10-CM | POA: Diagnosis not present

## 2021-04-13 DIAGNOSIS — F0280 Dementia in other diseases classified elsewhere without behavioral disturbance: Secondary | ICD-10-CM | POA: Diagnosis not present

## 2021-04-13 DIAGNOSIS — I10 Essential (primary) hypertension: Secondary | ICD-10-CM | POA: Diagnosis not present

## 2021-04-13 DIAGNOSIS — E785 Hyperlipidemia, unspecified: Secondary | ICD-10-CM | POA: Diagnosis not present

## 2021-04-17 DIAGNOSIS — Z8616 Personal history of COVID-19: Secondary | ICD-10-CM | POA: Diagnosis not present

## 2021-04-17 DIAGNOSIS — E785 Hyperlipidemia, unspecified: Secondary | ICD-10-CM | POA: Diagnosis not present

## 2021-04-17 DIAGNOSIS — I350 Nonrheumatic aortic (valve) stenosis: Secondary | ICD-10-CM | POA: Diagnosis not present

## 2021-04-17 DIAGNOSIS — G3183 Dementia with Lewy bodies: Secondary | ICD-10-CM | POA: Diagnosis not present

## 2021-04-17 DIAGNOSIS — I10 Essential (primary) hypertension: Secondary | ICD-10-CM | POA: Diagnosis not present

## 2021-04-17 DIAGNOSIS — F0280 Dementia in other diseases classified elsewhere without behavioral disturbance: Secondary | ICD-10-CM | POA: Diagnosis not present

## 2021-04-24 DIAGNOSIS — E785 Hyperlipidemia, unspecified: Secondary | ICD-10-CM | POA: Diagnosis not present

## 2021-04-24 DIAGNOSIS — I350 Nonrheumatic aortic (valve) stenosis: Secondary | ICD-10-CM | POA: Diagnosis not present

## 2021-04-24 DIAGNOSIS — I10 Essential (primary) hypertension: Secondary | ICD-10-CM | POA: Diagnosis not present

## 2021-04-24 DIAGNOSIS — F0280 Dementia in other diseases classified elsewhere without behavioral disturbance: Secondary | ICD-10-CM | POA: Diagnosis not present

## 2021-04-24 DIAGNOSIS — Z8616 Personal history of COVID-19: Secondary | ICD-10-CM | POA: Diagnosis not present

## 2021-04-24 DIAGNOSIS — G3183 Dementia with Lewy bodies: Secondary | ICD-10-CM | POA: Diagnosis not present

## 2021-04-26 DIAGNOSIS — M810 Age-related osteoporosis without current pathological fracture: Secondary | ICD-10-CM | POA: Diagnosis not present

## 2021-04-26 DIAGNOSIS — I10 Essential (primary) hypertension: Secondary | ICD-10-CM | POA: Diagnosis not present

## 2021-04-26 DIAGNOSIS — F0280 Dementia in other diseases classified elsewhere without behavioral disturbance: Secondary | ICD-10-CM | POA: Diagnosis not present

## 2021-04-26 DIAGNOSIS — E559 Vitamin D deficiency, unspecified: Secondary | ICD-10-CM | POA: Diagnosis not present

## 2021-04-26 DIAGNOSIS — Z8616 Personal history of COVID-19: Secondary | ICD-10-CM | POA: Diagnosis not present

## 2021-04-26 DIAGNOSIS — E785 Hyperlipidemia, unspecified: Secondary | ICD-10-CM | POA: Diagnosis not present

## 2021-04-26 DIAGNOSIS — I350 Nonrheumatic aortic (valve) stenosis: Secondary | ICD-10-CM | POA: Diagnosis not present

## 2021-04-26 DIAGNOSIS — E039 Hypothyroidism, unspecified: Secondary | ICD-10-CM | POA: Diagnosis not present

## 2021-04-26 DIAGNOSIS — G3183 Dementia with Lewy bodies: Secondary | ICD-10-CM | POA: Diagnosis not present

## 2021-04-26 DIAGNOSIS — K219 Gastro-esophageal reflux disease without esophagitis: Secondary | ICD-10-CM | POA: Diagnosis not present

## 2021-04-30 DIAGNOSIS — Z8616 Personal history of COVID-19: Secondary | ICD-10-CM | POA: Diagnosis not present

## 2021-04-30 DIAGNOSIS — E785 Hyperlipidemia, unspecified: Secondary | ICD-10-CM | POA: Diagnosis not present

## 2021-04-30 DIAGNOSIS — G3183 Dementia with Lewy bodies: Secondary | ICD-10-CM | POA: Diagnosis not present

## 2021-04-30 DIAGNOSIS — I10 Essential (primary) hypertension: Secondary | ICD-10-CM | POA: Diagnosis not present

## 2021-04-30 DIAGNOSIS — F0280 Dementia in other diseases classified elsewhere without behavioral disturbance: Secondary | ICD-10-CM | POA: Diagnosis not present

## 2021-04-30 DIAGNOSIS — I350 Nonrheumatic aortic (valve) stenosis: Secondary | ICD-10-CM | POA: Diagnosis not present

## 2021-05-01 ENCOUNTER — Non-Acute Institutional Stay (SKILLED_NURSING_FACILITY): Payer: Medicare Other | Admitting: Orthopedic Surgery

## 2021-05-01 ENCOUNTER — Encounter: Payer: Self-pay | Admitting: Orthopedic Surgery

## 2021-05-01 DIAGNOSIS — I10 Essential (primary) hypertension: Secondary | ICD-10-CM | POA: Diagnosis not present

## 2021-05-01 DIAGNOSIS — F0280 Dementia in other diseases classified elsewhere without behavioral disturbance: Secondary | ICD-10-CM | POA: Diagnosis not present

## 2021-05-01 DIAGNOSIS — K5909 Other constipation: Secondary | ICD-10-CM

## 2021-05-01 DIAGNOSIS — I350 Nonrheumatic aortic (valve) stenosis: Secondary | ICD-10-CM

## 2021-05-01 DIAGNOSIS — G20 Parkinson's disease: Secondary | ICD-10-CM | POA: Diagnosis not present

## 2021-05-01 DIAGNOSIS — E039 Hypothyroidism, unspecified: Secondary | ICD-10-CM | POA: Diagnosis not present

## 2021-05-01 DIAGNOSIS — K219 Gastro-esophageal reflux disease without esophagitis: Secondary | ICD-10-CM | POA: Diagnosis not present

## 2021-05-01 DIAGNOSIS — G609 Hereditary and idiopathic neuropathy, unspecified: Secondary | ICD-10-CM | POA: Diagnosis not present

## 2021-05-01 NOTE — Progress Notes (Signed)
Location:  [facility] Room Number: 107 Place of Service:  SNF (31) Provider:  Yvonna Alanis, NP   Patient Care Team: Royal Hawthorn, NP as PCP - General (Nurse Practitioner) Jola Schmidt, MD as Consulting Physician (Ophthalmology) Tat, Eustace Quail, DO as Consulting Physician (Neurology) Martinique, Peter M, MD as Consulting Physician (Cardiology)  Extended Emergency Contact Information Primary Emergency Contact: Wilmer,Tonya Address: [address]          [place], [place] [address] Johnnette Litter of [place] Phone: [phone] Mobile Phone: [phone] Relation: Daughter  Code Status:  DNR Goals of care: Advanced Directive information Advanced Directives 05/01/2021  Does Patient Have a Medical Advance Directive? Yes  Type of Advance Directive Out of facility DNR (pink MOST or yellow form);Living will  Does patient want to make changes to medical advance directive? No - Patient declined  Copy of [place] in Chart? -  Would patient like information on creating a medical advance directive? -  Pre-existing out of facility DNR order (yellow form or pink MOST form) Yellow form placed in chart (order not valid for inpatient use);Pink MOST form placed in chart (order not valid for inpatient use)     Chief Complaint  Patient presents with   Medical Management of Chronic Issues    Routine visit and discuss need for PCV, shingrix, and covid vaccines or postpone if patient refuses     HPI:  Pt is a 87 y.o. female seen today for medical management of chronic diseases.    She currently resides on the skilled nursing unit at [facility] PMH: aortic stenosis, HTN, HLD, parkinson's, hypothyroidism, melanoma, osteoporosis, peripheral neuropathy, Gilberts syndrome, and constipation.  Followed by hospice.   Parkinson's- ambulates with broda chair, able to feed self, remains on sinemet Dementia- behavioral outbursts associated with  getting dressed/ ADLs, also sundowning at times, remains on seroquel bid and haldol prn Hypothyroidism- TSH 2.06 07/2020, remains on levothyroxine HTN- BUN/creat 20/0.7 07/2020, remains on amlodipine Aortic stenosis- LVEF 55-60% 2016 Peripheral neuropathy- controlled without medication at this time Constipation- LBM 12/05, remains on miralax daily GERD- hgb 13.8 07/2020, remains on prilosec  No recent falls or injuries.   Recent weights:  12/01- 125.2 lbs  11/03- 125.2 lbs  10/03- 122.5 lbs      Past Medical History:  Diagnosis Date   Aortic stenosis, mild    Constipation, chronic    Gilbert syndrome    HTN (hypertension)    Hyperlipidemia    Hypothyroidism    Melanoma ([place])    Neuropathy    Osteoporosis    Parkinson's disease ([place])    Peripheral neuropathy    Bilateral   Rheumatic fever    does not take Flu shot    Past Surgical History:  Procedure Laterality Date   ABDOMINAL HYSTERECTOMY  1963   no BSO; no abnormal PAPs   COLONOSCOPY  2000   negative; [place]  08/2014    Allergies  Allergen Reactions   Sulfonamide Derivatives     REACTION:generalized  swelling   Gabapentin Hypertension and Swelling   Sulfa Antibiotics Swelling    Outpatient Encounter Medications as of 05/01/2021  Medication Sig   acetaminophen (TYLENOL) 325 MG tablet Take 325 mg by mouth at bedtime. And q 4 prn   amLODipine (NORVASC) 5 MG tablet Take 5 mg by mouth daily.   Calcium Carbonate-Vitamin D (CALCIUM 600/VITAMIN D PO) Take 1 tablet by  mouth daily.    Carbidopa-Levodopa ER (SINEMET CR) 25-100 MG tablet controlled release 2 by mouth at 6 am, 1 by mouth at 11 am, and 1 by mouth at 4 pm   Cholecalciferol (VITAMIN D) 2000 units CAPS Take 1 capsule by mouth daily.   haloperidol (HALDOL) 0.5 MG tablet Take 0.5 mg by mouth 2 (two) times daily. And every 6 hours as needed   levothyroxine (SYNTHROID, LEVOTHROID) 50 MCG tablet Take one by mouth  on Tuesday and Thursday 75 mcg other days   levothyroxine (SYNTHROID, LEVOTHROID) 75 MCG tablet Take 1 by mouth on Sunday, Monday, Wednesday, Friday, and Saturday   Melatonin 5 MG CAPS Take by mouth.   omeprazole (PRILOSEC) 20 MG capsule Take 20 mg by mouth daily.    polyethylene glycol (MIRALAX / GLYCOLAX) 17 g packet Take 17 g by mouth daily.    QUEtiapine (SEROQUEL) 25 MG tablet Take 25 mg by mouth 2 (two) times daily.   No facility-administered encounter medications on file as of 05/01/2021.    Review of Systems  Unable to perform ROS: Dementia   Immunization History  Administered Date(s) Administered   Influenza-Unspecified 03/20/2020   Moderna SARS-COV2 Booster Vaccination 04/05/2020   Moderna Sars-Covid-2 Vaccination 06/29/2019, 07/27/2019   PPD Test 04/29/2016   Tdap 08/11/2014   Pertinent  Health Maintenance Due  Topic Date Due   DEXA SCAN  Completed   INFLUENZA VACCINE  Discontinued   Fall Risk 09/16/2018 01/13/2019 03/03/2020 10/19/2020 11/16/2020  Falls in the past year? 0 0 1 Exclusion - non ambulatory 0  Number of falls in past year - - - - -  Was there an injury with Fall? 0 0 1 - 0  Was there an injury with Fall? - - - - -  Fall Risk Category Calculator 0 0 2 - 0  Fall Risk Category Low Low Moderate - Low  Patient Fall Risk Level Low fall risk Low fall risk Moderate fall risk - Moderate fall risk  Patient at Risk for Falls Due to - - History of fall(s);Impaired balance/gait Impaired balance/gait;History of fall(s) History of fall(s);Impaired balance/gait  Fall risk Follow up - - Falls evaluation completed Falls evaluation completed Falls evaluation completed   Functional Status Survey:    Vitals:   05/01/21 1255  BP: (!) 146/78  Pulse: 64  Resp: 18  Temp: (!) 97.2 F (36.2 C)  SpO2: 96%  Weight: 125 lb 3.2 oz (56.8 kg)  Height: 5\' 4"  (1.626 m)   Body mass index is 21.49 kg/m. Physical Exam Vitals reviewed.  Constitutional:      General: She is not in  acute distress. HENT:     Head: Normocephalic.     Right Ear: There is no impacted cerumen.     Left Ear: There is no impacted cerumen.     Nose: Nose normal.     Mouth/Throat:     Mouth: Mucous membranes are moist.  Eyes:     General:        Right eye: No discharge.        Left eye: No discharge.  Neck:     Vascular: No carotid bruit.  Cardiovascular:     Rate and Rhythm: Normal rate and regular rhythm.     Pulses: Normal pulses.     Heart sounds: Murmur heard.  Pulmonary:     Effort: Pulmonary effort is normal. No respiratory distress.     Breath sounds: Normal breath sounds. No wheezing.  Abdominal:  General: Bowel sounds are normal. There is no distension.     Palpations: Abdomen is soft.     Tenderness: There is no abdominal tenderness.  Musculoskeletal:     Cervical back: Normal range of motion.     Right lower leg: No edema.     Left lower leg: No edema.  Lymphadenopathy:     Cervical: No cervical adenopathy.  Skin:    General: Skin is warm and dry.     Capillary Refill: Capillary refill takes less than 2 seconds.  Neurological:     General: No focal deficit present.     Mental Status: She is alert. Mental status is at baseline.     Motor: Weakness present.     Gait: Gait abnormal.     Comments: Broda chair  Psychiatric:        Mood and Affect: Mood normal.        Behavior: Behavior normal.        Cognition and Memory: Memory is impaired.     Comments: Very pleasant, follows commands, oriented to place and familiar faces    Labs reviewed: Recent Labs    08/07/20 0000  NA 146  K 4.0  CL 109*  CO2 26*  BUN 20  CREATININE 0.7  CALCIUM 9.2   Recent Labs    08/07/20 0000  AST 18  ALKPHOS 51  ALBUMIN 3.9   Recent Labs    08/07/20 0000  WBC 5.5  HGB 13.8  HCT 41  PLT 164   Lab Results  Component Value Date   TSH 2.06 08/07/2020   No results found for: HGBA1C Lab Results  Component Value Date   CHOL 199 08/12/2014   HDL 87 08/12/2014    LDLCALC 104 (H) 08/12/2014   LDLDIRECT 105.9 06/15/2013   TRIG 38 08/12/2014   CHOLHDL 2.3 08/12/2014    Significant Diagnostic Results in last 30 days:  No results found.  Assessment/Plan 1. Parkinson's disease ([place]) - moving less, ambulates with wheelchair - cont skilled nursing care - cont sinemet  2. 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 Acquired hypothyroidism - TSH controlled - cont levothyroxine  4. Primary hypertension - controlled - cont amlodipine  5. Hereditary and idiopathic peripheral neuropathy - stable without medication  6. Aortic valve stenosis, etiology of cardiac valve disease unspecified - mild, EF 55-60% 2016  7. Chronic constipation - LBM 12/05 - cont miralax daily  8. Gastroesophageal reflux disease, unspecified whether esophagitis present - stable with Prilosec    Family/ staff Communication: plan discussed with patient and nurse  Labs/tests ordered:  none

## 2021-05-03 DIAGNOSIS — G3183 Dementia with Lewy bodies: Secondary | ICD-10-CM | POA: Diagnosis not present

## 2021-05-03 DIAGNOSIS — E785 Hyperlipidemia, unspecified: Secondary | ICD-10-CM | POA: Diagnosis not present

## 2021-05-03 DIAGNOSIS — Z8616 Personal history of COVID-19: Secondary | ICD-10-CM | POA: Diagnosis not present

## 2021-05-03 DIAGNOSIS — I10 Essential (primary) hypertension: Secondary | ICD-10-CM | POA: Diagnosis not present

## 2021-05-03 DIAGNOSIS — I350 Nonrheumatic aortic (valve) stenosis: Secondary | ICD-10-CM | POA: Diagnosis not present

## 2021-05-03 DIAGNOSIS — F0280 Dementia in other diseases classified elsewhere without behavioral disturbance: Secondary | ICD-10-CM | POA: Diagnosis not present

## 2021-05-07 DIAGNOSIS — Z8616 Personal history of COVID-19: Secondary | ICD-10-CM | POA: Diagnosis not present

## 2021-05-07 DIAGNOSIS — I350 Nonrheumatic aortic (valve) stenosis: Secondary | ICD-10-CM | POA: Diagnosis not present

## 2021-05-07 DIAGNOSIS — E785 Hyperlipidemia, unspecified: Secondary | ICD-10-CM | POA: Diagnosis not present

## 2021-05-07 DIAGNOSIS — F0280 Dementia in other diseases classified elsewhere without behavioral disturbance: Secondary | ICD-10-CM | POA: Diagnosis not present

## 2021-05-07 DIAGNOSIS — G3183 Dementia with Lewy bodies: Secondary | ICD-10-CM | POA: Diagnosis not present

## 2021-05-07 DIAGNOSIS — I10 Essential (primary) hypertension: Secondary | ICD-10-CM | POA: Diagnosis not present

## 2021-05-08 ENCOUNTER — Other Ambulatory Visit: Payer: Self-pay | Admitting: Orthopedic Surgery

## 2021-05-10 DIAGNOSIS — F0280 Dementia in other diseases classified elsewhere without behavioral disturbance: Secondary | ICD-10-CM | POA: Diagnosis not present

## 2021-05-10 DIAGNOSIS — G3183 Dementia with Lewy bodies: Secondary | ICD-10-CM | POA: Diagnosis not present

## 2021-05-10 DIAGNOSIS — I10 Essential (primary) hypertension: Secondary | ICD-10-CM | POA: Diagnosis not present

## 2021-05-10 DIAGNOSIS — E785 Hyperlipidemia, unspecified: Secondary | ICD-10-CM | POA: Diagnosis not present

## 2021-05-10 DIAGNOSIS — I350 Nonrheumatic aortic (valve) stenosis: Secondary | ICD-10-CM | POA: Diagnosis not present

## 2021-05-10 DIAGNOSIS — Z8616 Personal history of COVID-19: Secondary | ICD-10-CM | POA: Diagnosis not present

## 2021-05-15 DIAGNOSIS — Z8616 Personal history of COVID-19: Secondary | ICD-10-CM | POA: Diagnosis not present

## 2021-05-15 DIAGNOSIS — F0280 Dementia in other diseases classified elsewhere without behavioral disturbance: Secondary | ICD-10-CM | POA: Diagnosis not present

## 2021-05-15 DIAGNOSIS — I350 Nonrheumatic aortic (valve) stenosis: Secondary | ICD-10-CM | POA: Diagnosis not present

## 2021-05-15 DIAGNOSIS — E785 Hyperlipidemia, unspecified: Secondary | ICD-10-CM | POA: Diagnosis not present

## 2021-05-15 DIAGNOSIS — I10 Essential (primary) hypertension: Secondary | ICD-10-CM | POA: Diagnosis not present

## 2021-05-15 DIAGNOSIS — G3183 Dementia with Lewy bodies: Secondary | ICD-10-CM | POA: Diagnosis not present

## 2021-05-23 DIAGNOSIS — G3183 Dementia with Lewy bodies: Secondary | ICD-10-CM | POA: Diagnosis not present

## 2021-05-23 DIAGNOSIS — I10 Essential (primary) hypertension: Secondary | ICD-10-CM | POA: Diagnosis not present

## 2021-05-23 DIAGNOSIS — F0280 Dementia in other diseases classified elsewhere without behavioral disturbance: Secondary | ICD-10-CM | POA: Diagnosis not present

## 2021-05-23 DIAGNOSIS — Z8616 Personal history of COVID-19: Secondary | ICD-10-CM | POA: Diagnosis not present

## 2021-05-23 DIAGNOSIS — E785 Hyperlipidemia, unspecified: Secondary | ICD-10-CM | POA: Diagnosis not present

## 2021-05-23 DIAGNOSIS — I350 Nonrheumatic aortic (valve) stenosis: Secondary | ICD-10-CM | POA: Diagnosis not present

## 2021-05-29 ENCOUNTER — Encounter: Payer: Self-pay | Admitting: Orthopedic Surgery

## 2021-05-29 ENCOUNTER — Non-Acute Institutional Stay (SKILLED_NURSING_FACILITY): Payer: Medicare Other | Admitting: Orthopedic Surgery

## 2021-05-29 DIAGNOSIS — I10 Essential (primary) hypertension: Secondary | ICD-10-CM | POA: Diagnosis not present

## 2021-05-29 DIAGNOSIS — G20 Parkinson's disease: Secondary | ICD-10-CM | POA: Diagnosis not present

## 2021-05-29 DIAGNOSIS — K219 Gastro-esophageal reflux disease without esophagitis: Secondary | ICD-10-CM

## 2021-05-29 DIAGNOSIS — E039 Hypothyroidism, unspecified: Secondary | ICD-10-CM | POA: Diagnosis not present

## 2021-05-29 DIAGNOSIS — R222 Localized swelling, mass and lump, trunk: Secondary | ICD-10-CM | POA: Diagnosis not present

## 2021-05-29 DIAGNOSIS — F0280 Dementia in other diseases classified elsewhere without behavioral disturbance: Secondary | ICD-10-CM

## 2021-05-29 DIAGNOSIS — K5909 Other constipation: Secondary | ICD-10-CM

## 2021-05-29 DIAGNOSIS — I350 Nonrheumatic aortic (valve) stenosis: Secondary | ICD-10-CM

## 2021-05-29 NOTE — Progress Notes (Signed)
Location:   [facility] Room Number: 107 Place of Service:  SNF ([phone]) Provider:  Yvonna Alanis, NP  Royal Hawthorn, NP  Patient Care Team: Royal Hawthorn, NP as PCP - General (Nurse Practitioner) Jola Schmidt, MD as Consulting Physician (Ophthalmology) Tat, Eustace Quail, DO as Consulting Physician (Neurology) [place]ique, Peter M, MD as Consulting Physician (Cardiology)  Extended Emergency Contact Information Primary Emergency Contact: Gunner,Tonya Address: [address]          [place], [place] [address] Johnnette Litter of [place] Phone: [phone] Mobile Phone: [phone] Relation: Daughter  Code Status:  DNR Hospice Goals of care: Advanced Directive information Advanced Directives 05/29/2021  Does Patient Have a Medical Advance Directive? Yes  Type of Advance Directive Out of facility DNR (pink MOST or yellow form);Living will  Does patient want to make changes to medical advance directive? No - Patient declined  Copy of [place] in Chart? -  Would patient like information on creating a medical advance directive? -  Pre-existing out of facility DNR order (yellow form or pink MOST form) Yellow form placed in chart (order not valid for inpatient use);Pink MOST form placed in chart (order not valid for inpatient use)     Chief Complaint  Patient presents with   Medical Management of Chronic Issues   Quality Metric Gaps    Pneumonia Vaccine 36+ Years old (1 - PCV)   Zoster Vaccines- Shingrix (1 of 2) COVID-19 Vaccine      HPI:  Pt is a 88 y.o. female seen today for medical management of chronic diseases.    She currently resides on the skilled nursing unit at [facility] PMH: aortic stenosis, HTN, HLD, parkinson's, hypothyroidism, melanoma, osteoporosis, peripheral neuropathy, Gilberts syndrome, and constipation.   Nodule- nursing reports a quarter sized nodule to right buttocks, she denies pain Parkinson's-  followed by hospice, ambulates with broda chair, able to feed self, sinemet discontinued 05/10/2021 by hospice Dementia- behavioral outbursts associated with ADLs, remains on seroquel and haldol bid Hypothyroidism- TSH 2.06 07/2020, levothyroxine discontinued by hospice 05/10/2021 HTN- BUN/creat 20/0.7 07/2020, amlodipine discontinued by hospice 05/10/2021 Aortic stenosis- LVEF 55-60% 2016 Constipation- LBM 01/02, remains on miralax daily GERD- hgb 13.8 07/2020, Prilosec discontinued by hospice 05/10/2021  No recent falls or injuries. Ambulates with broda chair.   Recent blood pressures:  12/13- 113/72  12/06- 146/78  Recent weights:  12/01- 125.2 lbs  11/03- 125.2 lbs  10/03- 122.5 lbs   Past Medical History:  Diagnosis Date   Aortic stenosis, mild    Constipation, chronic    Gilbert syndrome    HTN (hypertension)    Hyperlipidemia    Hypothyroidism    Melanoma ([place])    Neuropathy    Osteoporosis    Parkinson's disease ([place])    Peripheral neuropathy    Bilateral   Rheumatic fever    does not take Flu shot    Past Surgical History:  Procedure Laterality Date   ABDOMINAL HYSTERECTOMY  1963   no BSO; no abnormal PAPs   COLONOSCOPY  2000   negative; [place]  08/2014    Allergies  Allergen Reactions   Sulfonamide Derivatives     REACTION:generalized  swelling   Gabapentin Hypertension and Swelling   Sulfa Antibiotics Swelling    Allergies as of 05/29/2021       Reactions   Sulfonamide Derivatives    REACTION:generalized  swelling   Gabapentin  Hypertension, Swelling   Sulfa Antibiotics Swelling        Medication List        Accurate as of May 29, 2021  4:42 PM. If you have any questions, ask your nurse or doctor.          acetaminophen 325 MG tablet Commonly known as: TYLENOL Take 325 mg by mouth at bedtime. And q 4 prn   amLODipine 5 MG tablet Commonly known as: NORVASC Take 5 mg by mouth  daily.   CALCIUM 600/VITAMIN D PO Take 1 tablet by mouth daily.   Carbidopa-Levodopa ER 25-100 MG tablet controlled release Commonly known as: SINEMET CR 2 by mouth at 6 am, 1 by mouth at 11 am, and 1 by mouth at 4 pm   haloperidol 0.5 MG tablet Commonly known as: HALDOL Take 0.5 mg by mouth every 6 (six) hours as needed. And every 6 hours as needed   levothyroxine 50 MCG tablet Commonly known as: SYNTHROID Take one by mouth on Tuesday and Thursday 75 mcg other days   levothyroxine 75 MCG tablet Commonly known as: SYNTHROID Take 1 by mouth on Sunday, Monday, Wednesday, Friday, and Saturday   Melatonin 5 MG Caps Take by mouth.   omeprazole 20 MG capsule Commonly known as: PRILOSEC Take 20 mg by mouth daily.   polyethylene glycol 17 g packet Commonly known as: MIRALAX / GLYCOLAX Take 17 g by mouth daily.   QUEtiapine 25 MG tablet Commonly known as: SEROQUEL Take 25 mg by mouth 2 (two) times daily.   Vitamin D 50 MCG (2000 UT) Caps Take 1 capsule by mouth daily.        Review of Systems  Unable to perform ROS: Dementia   Immunization History  Administered Date(s) Administered   Influenza-Unspecified 03/20/2020   Moderna SARS-COV2 Booster Vaccination 04/05/2020   Moderna Sars-Covid-2 Vaccination 06/29/2019, 07/27/2019   PPD Test 04/29/2016   Tdap 08/11/2014   Pertinent  Health Maintenance Due  Topic Date Due   DEXA SCAN  Completed   INFLUENZA VACCINE  Discontinued   Fall Risk 09/16/2018 01/13/2019 03/03/2020 10/19/2020 11/16/2020  Falls in the past year? 0 0 1 Exclusion - non ambulatory 0  Number of falls in past year - - - - -  Was there an injury with Fall? 0 0 1 - 0  Was there an injury with Fall? - - - - -  Fall Risk Category Calculator 0 0 2 - 0  Fall Risk Category Low Low Moderate - Low  Patient Fall Risk Level Low fall risk Low fall risk Moderate fall risk - Moderate fall risk  Patient at Risk for Falls Due to - - History of fall(s);Impaired  balance/gait Impaired balance/gait;History of fall(s) History of fall(s);Impaired balance/gait  Fall risk Follow up - - Falls evaluation completed Falls evaluation completed Falls evaluation completed   Functional Status Survey:    Vitals:   05/29/21 1639  BP: 113/72  Pulse: 63  Resp: 18  Temp: (!) 96.8 F (36 C)  SpO2: 92%  Weight: 125 lb 3.2 oz (56.8 kg)  Height: 5\' 4"  (1.626 m)   Body mass index is 21.49 kg/m. Physical Exam Vitals reviewed.  Constitutional:      General: She is not in acute distress. HENT:     Head: Normocephalic.     Right Ear: There is no impacted cerumen.     Left Ear: There is no impacted cerumen.     Nose: Nose normal.  Mouth/Throat:     Mouth: Mucous membranes are moist.  Eyes:     General:        Right eye: No discharge.        Left eye: No discharge.  Neck:     Vascular: No carotid bruit.  Cardiovascular:     Rate and Rhythm: Normal rate and regular rhythm.     Pulses: Normal pulses.     Heart sounds: Normal heart sounds. No murmur heard. Pulmonary:     Effort: Pulmonary effort is normal. No respiratory distress.     Breath sounds: Normal breath sounds. No wheezing.  Abdominal:     General: Bowel sounds are normal. There is no distension.     Palpations: Abdomen is soft.     Tenderness: There is no abdominal tenderness.  Musculoskeletal:     Cervical back: Rigidity present.     Right lower leg: No edema.     Left lower leg: No edema.  Lymphadenopathy:     Cervical: No cervical adenopathy.  Skin:    General: Skin is warm.     Capillary Refill: Capillary refill takes less than 2 seconds.     Comments: Quarter sized, firm, nodule to right buttocks, non tender to touch, mild redness, no skin breakdown or drainage.   Neurological:     General: No focal deficit present.     Mental Status: She is alert. Mental status is at baseline.     Motor: Weakness present.     Gait: Gait abnormal.     Comments: Broda chair  Psychiatric:         Mood and Affect: Mood normal.        Behavior: Behavior normal.        Cognition and Memory: Memory is impaired.    Labs reviewed: Recent Labs    08/07/20 0000  NA 146  K 4.0  CL 109*  CO2 26*  BUN 20  CREATININE 0.7  CALCIUM 9.2   Recent Labs    08/07/20 0000  AST 18  ALKPHOS 51  ALBUMIN 3.9   Recent Labs    08/07/20 0000  WBC 5.5  HGB 13.8  HCT 41  PLT 164   Lab Results  Component Value Date   TSH 2.06 08/07/2020   No results found for: HGBA1C Lab Results  Component Value Date   CHOL 199 08/12/2014   HDL 87 08/12/2014   LDLCALC 104 (H) 08/12/2014   LDLDIRECT 105.9 06/15/2013   TRIG 38 08/12/2014   CHOLHDL 2.3 08/12/2014    Significant Diagnostic Results in last 30 days:  No results found.  Assessment/Plan 1. Buttocks nodule - quarter sized, firm, fixed, non tender nodule, some redness - ? Lipoma - hospice notified - cont to monitor  2. Parkinson's disease ([place]) - off sinemet per hospice - cont skilled nursing  3. 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 Acquired hypothyroidism - TSH controlled - off levothyroxine per hospice  5. Primary hypertension - controlled without medication  6. Aortic valve stenosis, etiology of cardiac valve disease unspecified - LVEF 55-60% 2016  7. Chronic constipation - cont miralax and dulcolax prn  8. Gastroesophageal reflux disease, unspecified whether esophagitis present - LBM 01/02, abdomen soft - off Prilosec per hospice    Family/ staff Communication: plan discussed with patient and nurse  Labs/tests ordered:  none

## 2021-06-28 ENCOUNTER — Non-Acute Institutional Stay (SKILLED_NURSING_FACILITY): Payer: Medicare Other | Admitting: Adult Health

## 2021-06-28 ENCOUNTER — Encounter: Payer: Self-pay | Admitting: Adult Health

## 2021-06-28 DIAGNOSIS — E039 Hypothyroidism, unspecified: Secondary | ICD-10-CM

## 2021-06-28 DIAGNOSIS — K5909 Other constipation: Secondary | ICD-10-CM | POA: Diagnosis not present

## 2021-06-28 DIAGNOSIS — M810 Age-related osteoporosis without current pathological fracture: Secondary | ICD-10-CM

## 2021-06-28 DIAGNOSIS — F0280 Dementia in other diseases classified elsewhere without behavioral disturbance: Secondary | ICD-10-CM

## 2021-06-28 DIAGNOSIS — G20 Parkinson's disease: Secondary | ICD-10-CM | POA: Diagnosis not present

## 2021-06-28 DIAGNOSIS — I872 Venous insufficiency (chronic) (peripheral): Secondary | ICD-10-CM

## 2021-06-28 NOTE — Progress Notes (Signed)
Location:   [facility] Room Number: 107 Place of Service:  SNF ([phone]) Provider:  Royal Hawthorn, NP  Royal Hawthorn, NP  Patient Care Team: Royal Hawthorn, NP as PCP - General (Nurse Practitioner) Jola Schmidt, MD as Consulting Physician (Ophthalmology) Tat, Eustace Quail, DO as Consulting Physician (Neurology) Martinique, Peter M, MD as Consulting Physician (Cardiology)  Extended Emergency Contact Information Primary Emergency Contact: Helgeson,Tonya Address: [address]          [place], [place] [address] Johnnette Litter of [employer] [place] Phone: [phone] Mobile Phone: [phone] Relation: Daughter  Code Status:  DNRHospice Goals of care: Advanced Directive information Advanced Directives 06/28/2021  Does Patient Have a Medical Advance Directive? Yes  Type of Advance Directive Out of facility DNR (pink MOST or yellow form);Living will  Does patient want to make changes to medical advance directive? No - Patient declined  Copy of [place] in Chart? -  Would patient like information on creating a medical advance directive? -  Pre-existing out of facility DNR order (yellow form or pink MOST form) Yellow form placed in chart (order not valid for inpatient use);Pink MOST form placed in chart (order not valid for inpatient use)     Chief Complaint  Patient presents with   Medical Management of Chronic Issues    Pneumonia Vaccine 40+ Years old (1 - PCV)   Zoster Vaccines- Shingrix (1 of 2)      HPI:  Pt is a 88 y.o. female seen today for medical management of chronic diseases.  She is currently followed by hospice for declining status with PD and dementia.    Bps reviewed  Blood Pressure: 128 / 80 mmHg  Blood Pressure: 162 / 86 mmHg  Nurses indicate that she is progressively weaker and is now in a new supportive Broda chair. Intake is reduced and she is sleeping a little more. They notified hospice. Due to agitation and  restlessness, also with delusions she is on seroquel and haldol per hospice. She is quite calm for my visit. She is off sinemet for PD. No longer ambulatory. She sits at the nursing station for support and monitoring as she sometimes appears anxious and also attempts to get up without help.  Continues with compression hose, no edema.  Continues on miralax, did require a supp last week per nursing notes. LBM 2 days ago. No abd pain, n, v.  Very pleasant for my visit today with no acute complaints. Having a delusion that she left the building.   Past Medical History:  Diagnosis Date   Aortic stenosis, mild    Constipation, chronic    Gilbert syndrome    HTN (hypertension)    Hyperlipidemia    Hypothyroidism    Melanoma (HCC)    Neuropathy    Osteoporosis    Parkinson's disease ([place])    Peripheral neuropathy    Bilateral   Rheumatic fever    does not take Flu shot    Past Surgical History:  Procedure Laterality Date   ABDOMINAL HYSTERECTOMY  1963   no BSO; no abnormal PAPs   COLONOSCOPY  2000   negative; [place]  08/2014    Allergies  Allergen Reactions   Sulfonamide Derivatives     REACTION:generalized  swelling   Gabapentin Hypertension and Swelling   Sulfa Antibiotics Swelling    Allergies as of 06/28/2021       Reactions   Sulfonamide  Derivatives    REACTION:generalized  swelling   Gabapentin Hypertension, Swelling   Sulfa Antibiotics Swelling        Medication List        Accurate as of June 28, 2021  4:05 PM. If you have any questions, ask your nurse or doctor.          STOP taking these medications    bisacodyl 5 MG EC tablet Commonly known as: DULCOLAX Stopped by: Royal Hawthorn, NP       TAKE these medications    acetaminophen 325 MG tablet Commonly known as: TYLENOL Take 325 mg by mouth at bedtime. And q 4 prn   haloperidol 0.5 MG tablet Commonly known as: HALDOL Take 0.5 mg by mouth  every 6 (six) hours as needed. And every 6 hours as needed   Melatonin 5 MG Caps Take by mouth.   polyethylene glycol 17 g packet Commonly known as: MIRALAX / GLYCOLAX Take 17 g by mouth daily.   QUEtiapine 25 MG tablet Commonly known as: SEROQUEL Take 25 mg by mouth 2 (two) times daily.        Review of Systems  Unable to perform ROS: Dementia   Immunization History  Administered Date(s) Administered   Influenza-Unspecified 03/20/2020   Moderna SARS-COV2 Booster Vaccination 04/05/2020   Moderna Sars-Covid-2 Vaccination 06/29/2019, 07/27/2019   PPD Test 04/29/2016   Tdap 08/11/2014   Pertinent  Health Maintenance Due  Topic Date Due   DEXA SCAN  Completed   INFLUENZA VACCINE  Discontinued   Fall Risk 09/16/2018 01/13/2019 03/03/2020 10/19/2020 11/16/2020  Falls in the past year? 0 0 1 Exclusion - non ambulatory 0  Number of falls in past year - - - - -  Was there an injury with Fall? 0 0 1 - 0  Was there an injury with Fall? - - - - -  Fall Risk Category Calculator 0 0 2 - 0  Fall Risk Category Low Low Moderate - Low  Patient Fall Risk Level Low fall risk Low fall risk Moderate fall risk - Moderate fall risk  Patient at Risk for Falls Due to - - History of fall(s);Impaired balance/gait Impaired balance/gait;History of fall(s) History of fall(s);Impaired balance/gait  Fall risk Follow up - - Falls evaluation completed Falls evaluation completed Falls evaluation completed   Functional Status Survey:    Vitals:   06/28/21 1557  BP: 128/80  Pulse: 60  Resp: 16  Temp: (!) 97.5 F (36.4 C)  SpO2: 96%  Weight: 125 lb 3.2 oz (56.8 kg)  Height: 5\' 4"  (1.626 m)   Body mass index is 21.49 kg/m. Physical Exam Vitals and nursing note reviewed.  Constitutional:      General: She is not in acute distress.    Appearance: She is not diaphoretic.  HENT:     Head: Normocephalic and atraumatic.     Mouth/Throat:     Mouth: Mucous membranes are moist.     Pharynx:  Oropharynx is clear.  Neck:     Vascular: No JVD.  Cardiovascular:     Rate and Rhythm: Normal rate and regular rhythm.     Heart sounds: Murmur heard.  Pulmonary:     Effort: Pulmonary effort is normal. No respiratory distress.     Breath sounds: Normal breath sounds. No wheezing.  Abdominal:     General: Bowel sounds are normal. There is distension (chronic rotund.).     Palpations: Abdomen is soft. There is no mass.  Tenderness: There is no abdominal tenderness.  Musculoskeletal:     Right lower leg: No edema.     Left lower leg: No edema.  Skin:    General: Skin is warm and dry.  Neurological:     Mental Status: She is alert.     Comments: Oriented to self only. Decreased ROM to BUE and BLE. No facial droop. Calm and able to f/c.    Psychiatric:     Comments: delusions    Labs reviewed: Recent Labs    08/07/20 0000  NA 146  K 4.0  CL 109*  CO2 26*  BUN 20  CREATININE 0.7  CALCIUM 9.2   Recent Labs    08/07/20 0000  AST 18  ALKPHOS 51  ALBUMIN 3.9   Recent Labs    08/07/20 0000  WBC 5.5  HGB 13.8  HCT 41  PLT 164   Lab Results  Component Value Date   TSH 2.06 08/07/2020   No results found for: HGBA1C Lab Results  Component Value Date   CHOL 199 08/12/2014   HDL 87 08/12/2014   LDLCALC 104 (H) 08/12/2014   LDLDIRECT 105.9 06/15/2013   TRIG 38 08/12/2014   CHOLHDL 2.3 08/12/2014    Significant Diagnostic Results in last 30 days:  No results found.  Assessment/Plan  Parkinson's disease ([place]) Off sinemet. Non ambulatory. Has rigidity. No changes.    Dementia associated with Parkinson's disease ([place]) Progressive decline in cognition and physical function c/w the disease. Continue supportive care in the skilled environment.   Senile osteoporosis Off meds for this due to goals of care and non weight bearing status.   Gilbert's syndrome Would not monitor labs due to hospice status.  Chronic constipation Continue miralax qd If  needed add senokot  Chronic venous insufficiency NO current issues with edema.  Wears hose.   Hypothyroidism Off synthroid    Seems comfortable, under hospice care.

## 2021-06-29 ENCOUNTER — Encounter: Payer: Self-pay | Admitting: Adult Health

## 2021-06-29 NOTE — Assessment & Plan Note (Signed)
Continue miralax qd If needed add senokot

## 2021-06-29 NOTE — Assessment & Plan Note (Signed)
Progressive decline in cognition and physical function c/w the disease. Continue supportive care in the skilled environment.

## 2021-06-29 NOTE — Assessment & Plan Note (Signed)
NO current issues with edema.  Wears hose.

## 2021-06-29 NOTE — Assessment & Plan Note (Signed)
Off synthroid

## 2021-06-29 NOTE — Assessment & Plan Note (Signed)
Off sinemet. Non ambulatory. Has rigidity. No changes.

## 2021-06-29 NOTE — Assessment & Plan Note (Signed)
Off meds for this due to goals of care and non weight bearing status.

## 2021-06-29 NOTE — Assessment & Plan Note (Signed)
Would not monitor labs due to hospice status.

## 2021-07-30 ENCOUNTER — Encounter: Payer: Self-pay | Admitting: Internal Medicine

## 2021-07-30 ENCOUNTER — Non-Acute Institutional Stay (SKILLED_NURSING_FACILITY): Admitting: Internal Medicine

## 2021-07-30 DIAGNOSIS — F0280 Dementia in other diseases classified elsewhere without behavioral disturbance: Secondary | ICD-10-CM

## 2021-07-30 DIAGNOSIS — E039 Hypothyroidism, unspecified: Secondary | ICD-10-CM | POA: Diagnosis not present

## 2021-07-30 DIAGNOSIS — G20 Parkinson's disease: Secondary | ICD-10-CM | POA: Diagnosis not present

## 2021-07-30 DIAGNOSIS — I10 Essential (primary) hypertension: Secondary | ICD-10-CM

## 2021-07-30 NOTE — Progress Notes (Addendum)
?Location:   [facility] ?Nursing Home Room Number: 107 ?Place of Service:  SNF (31) ?Provider:  Veleta Miners MD  ? ?Royal Hawthorn, NP ? ?Patient Care Team: ?Royal Hawthorn, NP as PCP - General (Nurse Practitioner) ?Jola Schmidt, MD as Consulting Physician (Ophthalmology) ?Ludwig Clarks, DO as Consulting Physician (Neurology) ?Martinique, Peter M, MD as Consulting Physician (Cardiology) ? ?Extended Emergency Contact Information ?Primary Emergency Contact: Causey,Tonya ?Address: [address] ?         [place], [place] [address] [place] ?Home Phone: [phone] ?Mobile Phone: [phone] ?Relation: Daughter ? ?Code Status:  DNR Hospice ?Goals of care: Advanced Directive information ?Advanced Directives 07/30/2021  ?Does Patient Have a Medical Advance Directive? Yes  ?Type of Advance Directive Out of facility DNR (pink MOST or yellow form);Living will  ?Does patient want to make changes to medical advance directive? No - Patient declined  ?Copy of [place] in Chart? -  ?Would patient like information on creating a medical advance directive? -  ?Pre-existing out of facility DNR order (yellow form or pink MOST form) Yellow form placed in chart (order not valid for inpatient use);Pink MOST form placed in chart (order not valid for inpatient use)  ? ? ? ?Chief Complaint  ?Patient presents with  ? Medical Management of Chronic Issues  ? ? ?HPI:  ?Pt is a 88 y.o. female seen today for medical management of chronic diseases.   ? ?Patient has h/o AS, H/o Parkinson With Dementia and Behavior issues with Lutricia Horsfall, h/o Neuropathy HTN, HLD, Hypothyroid, Rosanna Randy Syndrome ?Covid infection ?  ?Admitted in Hospice ?Continuous to get weak. Now [employer] lift dependent ?Need help with feeding. Poor Appetite ?Hospice just increased her Seroquel 50 mg to BID to help with her Behaviors ? ?She did not have any acute issues ?Wt Readings from Last 3 Encounters:  ?07/30/21 122 lb  9.6 oz (55.6 kg)  ?06/28/21 125 lb 3.2 oz (56.8 kg)  ?05/29/21 125 lb 3.2 oz (56.8 kg)  ? ? ? ?Past Medical History:  ?Diagnosis Date  ? Aortic stenosis, mild   ? Constipation, chronic   ? Rosanna Randy syndrome   ? HTN (hypertension)   ? Hyperlipidemia   ? Hypothyroidism   ? Melanoma ([place])   ? Neuropathy   ? Osteoporosis   ? Parkinson's disease ([place])   ? Peripheral neuropathy   ? Bilateral  ? Rheumatic fever   ? does not take Flu shot   ? ?Past Surgical History:  ?Procedure Laterality Date  ? ABDOMINAL HYSTERECTOMY  1963  ? no BSO; no abnormal PAPs  ? COLONOSCOPY  2000  ? negative; [place]  ? [place]  ? MELANOMA EXCISION  08/2014  ? ? ?Allergies  ?Allergen Reactions  ? Sulfonamide Derivatives   ?  REACTION:generalized  swelling  ? Gabapentin Hypertension and Swelling  ? Sulfa Antibiotics Swelling  ? ? ?Allergies as of 07/30/2021   ? ?   Reactions  ? Sulfonamide Derivatives   ? REACTION:generalized  swelling  ? Gabapentin Hypertension, Swelling  ? Sulfa Antibiotics Swelling  ? ?  ? ?  ?Medication List  ?  ? ?  ? Accurate as of July 30, 2021 10:32 AM. If you have any questions, ask your nurse or doctor.  ?  ?  ? ?  ? ?acetaminophen 325 MG tablet ?Commonly known as: TYLENOL ?Take 325 mg by mouth at bedtime. And q 4 prn ?  ?haloperidol 0.5 MG tablet ?Commonly  known as: HALDOL ?Take 0.5 mg by mouth every 6 (six) hours as needed. And every 6 hours as needed ?  ?Melatonin 5 MG Caps ?Take by mouth. ?  ?polyethylene glycol 17 g packet ?Commonly known as: MIRALAX / GLYCOLAX ?Take 17 g by mouth daily. ?  ?QUEtiapine 50 MG tablet ?Commonly known as: SEROQUEL ?Take 50 mg by mouth 2 (two) times daily. ?  ? ?  ? ? ?Review of Systems  ?Unable to perform ROS: Dementia  ? ?Immunization History  ?Administered Date(s) Administered  ? Influenza-Unspecified 03/20/2020  ? Moderna SARS-COV2 Booster Vaccination 04/05/2020  ? Moderna Sars-Covid-2 Vaccination 06/29/2019, 07/27/2019  ? PPD Test 04/29/2016  ? Tdap 08/11/2014   ? ?Pertinent  Health Maintenance Due  ?Topic Date Due  ? DEXA SCAN  Completed  ? INFLUENZA VACCINE  Discontinued  ? ?Fall Risk 09/16/2018 01/13/2019 03/03/2020 10/19/2020 11/16/2020  ?Falls in the past year? 0 0 1 Exclusion - non ambulatory 0  ?Number of falls in past year - - - - -  ?Was there an injury with Fall? 0 0 1 - 0  ?Was there an injury with Fall? - - - - -  ?Fall Risk Category Calculator 0 0 2 - 0  ?Fall Risk Category Low Low Moderate - Low  ?Patient Fall Risk Level Low fall risk Low fall risk Moderate fall risk - Moderate fall risk  ?Patient at Risk for Falls Due to - - History of fall(s);Impaired balance/gait Impaired balance/gait;History of fall(s) History of fall(s);Impaired balance/gait  ?Fall risk Follow up - - Falls evaluation completed Falls evaluation completed Falls evaluation completed  ? ?Functional Status Survey: ?  ? ?Vitals:  ? 07/30/21 1029  ?BP: (!) 158/85  ?Pulse: (!) 53  ?Resp: 16  ?Temp: (!) 97.5 ?F (36.4 ?C)  ?SpO2: 94%  ?Weight: 122 lb 9.6 oz (55.6 kg)  ?Height: '5\' 4"'$  (1.626 m)  ? ?Body mass index is 21.04 kg/m?Marland Kitchen ?Physical Exam ?Vitals reviewed.  ?Constitutional:   ?   Appearance: Normal appearance.  ?HENT:  ?   Head: Normocephalic.  ?   Nose: Nose normal.  ?   Mouth/Throat:  ?   Mouth: Mucous membranes are moist.  ?   Pharynx: Oropharynx is clear.  ?Eyes:  ?   Pupils: Pupils are equal, round, and reactive to light.  ?Cardiovascular:  ?   Rate and Rhythm: Normal rate and regular rhythm.  ?   Pulses: Normal pulses.  ?   Heart sounds: Normal heart sounds. No murmur heard. ?Pulmonary:  ?   Effort: Pulmonary effort is normal.  ?   Breath sounds: Normal breath sounds.  ?Abdominal:  ?   General: Abdomen is flat. Bowel sounds are normal.  ?   Palpations: Abdomen is soft.  ?Musculoskeletal:     ?   General: No swelling.  ?   Cervical back: Neck supple.  ?Skin: ?   General: Skin is warm.  ?Neurological:  ?   General: No focal deficit present.  ?   Mental Status: She is alert.  ?Psychiatric:     ?    Mood and Affect: Mood normal.     ?   Thought Content: Thought content normal.  ? ? ?Labs reviewed: ?Recent Labs  ?  08/07/20 ?0000  ?NA 146  ?K 4.0  ?CL 109*  ?CO2 26*  ?BUN 20  ?CREATININE 0.7  ?CALCIUM 9.2  ? ?Recent Labs  ?  08/07/20 ?0000  ?AST 18  ?ALKPHOS 51  ?ALBUMIN 3.9  ? ?  Recent Labs  ?  08/07/20 ?0000  ?WBC 5.5  ?HGB 13.8  ?HCT 41  ?PLT 164  ? ?Lab Results  ?Component Value Date  ? TSH 2.06 08/07/2020  ? ?No results found for: HGBA1C ?Lab Results  ?Component Value Date  ? CHOL 199 08/12/2014  ? HDL 87 08/12/2014  ? LDLCALC 104 (H) 08/12/2014  ? LDLDIRECT 105.9 06/15/2013  ? TRIG 38 08/12/2014  ? CHOLHDL 2.3 08/12/2014  ? ? ?Significant Diagnostic Results in last 30 days:  ?No results found. ? ?Assessment/Plan ?1. 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 ?2. Parkinson's disease ([place]) ?Off sinemet now ? ?3. Acquired hypothyroidism ?Off Synthroid also ? ?4. Primary hypertension ?Off Norvasc ?Continue to monitor ? ? ? ?Family/ staff Communication:  ? ?Labs/tests ordered:   ? ?  ?

## 2021-08-28 ENCOUNTER — Encounter: Payer: Self-pay | Admitting: Orthopedic Surgery

## 2021-08-28 ENCOUNTER — Non-Acute Institutional Stay (SKILLED_NURSING_FACILITY): Payer: Medicare Other | Admitting: Orthopedic Surgery

## 2021-08-28 DIAGNOSIS — G20 Parkinson's disease: Secondary | ICD-10-CM | POA: Diagnosis not present

## 2021-08-28 DIAGNOSIS — E039 Hypothyroidism, unspecified: Secondary | ICD-10-CM | POA: Diagnosis not present

## 2021-08-28 DIAGNOSIS — K5909 Other constipation: Secondary | ICD-10-CM

## 2021-08-28 DIAGNOSIS — L89312 Pressure ulcer of right buttock, stage 2: Secondary | ICD-10-CM

## 2021-08-28 DIAGNOSIS — I10 Essential (primary) hypertension: Secondary | ICD-10-CM

## 2021-08-28 DIAGNOSIS — F0280 Dementia in other diseases classified elsewhere without behavioral disturbance: Secondary | ICD-10-CM

## 2021-08-28 DIAGNOSIS — G20A1 Parkinson's disease without dyskinesia, without mention of fluctuations: Secondary | ICD-10-CM

## 2021-08-28 DIAGNOSIS — K219 Gastro-esophageal reflux disease without esophagitis: Secondary | ICD-10-CM

## 2021-08-28 DIAGNOSIS — D692 Other nonthrombocytopenic purpura: Secondary | ICD-10-CM

## 2021-08-28 MED ORDER — HALOPERIDOL 0.5 MG PO TABS: 0.5000 mg | ORAL_TABLET | Freq: Two times a day (BID) | ORAL | 0 refills | Status: AC

## 2021-08-28 NOTE — Progress Notes (Addendum)
? ?  Subjective:  ? ? Patient ID: Natalie Lam, female    DOB: [birth year], 88 y.o.   MRN: [id number] ? ?HPI ? ?Pt seen today for routine visit. Resides on SNF unit at [facility]. Was seen lying in bed In her room. She denies any pain or concerns.  ? ?Nursing notes a wound on right buttock that has been having purulent drainage. Nursing has been cleaning with normal saline, covering with gauze and then securing with paper tape. Nursing also notes an increase in behaviors with the newer nursing staff, but has been controlled with haldol.  ? ?PMH: Parkinson's w/ dementia, HTN, HLD, Gilbert syndrome, hypothyroidism, costipation ? ?Patient is hospice enrolled. No longer on medications to treat chronic problems per goals of care.  ? ?Review of Systems  ?Unable to perform ROS: Dementia  ? ?   ?Objective:  ? Physical Exam ?Constitutional:   ?   General: She is not in acute distress. ?Eyes:  ?   Conjunctiva/sclera: Conjunctivae normal.  ?Cardiovascular:  ?   Rate and Rhythm: Normal rate.  ?   Pulses: Normal pulses.  ?   Heart sounds: Murmur heard.  ?Pulmonary:  ?   Effort: Pulmonary effort is normal. No respiratory distress.  ?   Breath sounds: Normal breath sounds.  ?Abdominal:  ?   General: There is distension.  ?   Palpations: Abdomen is soft.  ?   Tenderness: There is no abdominal tenderness.  ?Musculoskeletal:     ?   General: No swelling.  ?Skin: ?   Comments: Senile purpura on BLE. ?Wound with tunneling and  purulent drainage on rt buttock. No surrounding eythema. ~ 2cm wide and 4cm long.  ?Neurological:  ?   Mental Status: She is alert.  ?Psychiatric:     ?   Mood and Affect: Mood normal.     ?   Cognition and Memory: Memory is impaired.  ? ? ? ? ? ?   ?Assessment & Plan:  ? ?1. 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 ? ?2. Chronic constipation ?-Continue Miralax ? ?3. Pressure injury  of right buttock, stage 2  ?-Continue wound care ?-Continue off loading pressure ?

## 2021-08-28 NOTE — Progress Notes (Addendum)
?Location:  [facility] ?Nursing Home Room Number: 107 ?Place of Service:  SNF (31) ?Provider:  Yvonna Alanis, NP  ? ?Royal Hawthorn, NP ? ?Patient Care Team: ?Royal Hawthorn, NP as PCP - General (Nurse Practitioner) ?Jola Schmidt, MD as Consulting Physician (Ophthalmology) ?Ludwig Clarks, DO as Consulting Physician (Neurology) ?Martinique, Peter M, MD as Consulting Physician (Cardiology) ? ?Extended Emergency Contact Information ?Primary Emergency Contact: Damon,Tonya ?Address: [address] ?         [place], [place] [address] [place] ?Home Phone: [phone] ?Mobile Phone: [phone] ?Relation: Daughter ? ?Code Status:  DNR ?Goals of care: Advanced Directive information ? ?  07/30/2021  ? 10:31 AM  ?Advanced Directives  ?Does Patient Have a Medical Advance Directive? Yes  ?Type of Advance Directive Out of facility DNR (pink MOST or yellow form);Living will  ?Does patient want to make changes to medical advance directive? No - Patient declined  ?Pre-existing out of facility DNR order (yellow form or pink MOST form) Yellow form placed in chart (order not valid for inpatient use);Pink MOST form placed in chart (order not valid for inpatient use)  ? ? ? ?Chief Complaint  ?Patient presents with  ? Medical Management of Chronic Issues  ? ? ?HPI:  ?Pt is a 88 y.o. female seen today for medical management of chronic diseases.   ? ?She currently resides on the skilled nursing unit at [facility] PMH: aortic stenosis, HTN, HLD, parkinson's, hypothyroidism, melanoma, osteoporosis, peripheral neuropathy, Gilberts syndrome, and constipation. ? ?Dementia- behavioral outbursts associated with ADLs- doing well with Haldol prn, remains on seroquel and scheduled haldol ?Pressure ulcer- right buttocks with nodule a few months back, area now open per nursing, covered with gauze and paper tape per family request ?Parkinson's- followed by hospice, ambulates with broda chair, able to feed self,  sinemet discontinued 05/10/2021 by hospice ?Hypothyroidism- TSH 2.06 07/2020, levothyroxine discontinued by hospice 05/10/2021 ?HTN- BUN/creat 20/0.7 07/2020, amlodipine discontinued by hospice 05/10/2021 ?GERD- hgb 13.8 07/2020, Prilosec discontinued by hospice 05/10/2021 ?Constipation- LBM 04/04, remains on miralax daily ? ?No recent falls or injuries. Ambulates in [place] chair.  ? ?Recent blood pressures: ?04/04- 144/81 ?03/14- 124/78 ?03/07-144/75 ? ?Recent weights: ? 03/01- 122.6 lbs ? 12/01- 125.2 lbs ? 11/03- 125.2 lbs ? ?Past Medical History:  ?Diagnosis Date  ? Aortic stenosis, mild   ? Constipation, chronic   ? Rosanna Randy syndrome   ? HTN (hypertension)   ? Hyperlipidemia   ? Hypothyroidism   ? Melanoma ([place])   ? Neuropathy   ? Osteoporosis   ? Parkinson's disease ([place])   ? Peripheral neuropathy   ? Bilateral  ? Rheumatic fever   ? does not take Flu shot   ? ?Past Surgical History:  ?Procedure Laterality Date  ? ABDOMINAL HYSTERECTOMY  1963  ? no BSO; no abnormal PAPs  ? COLONOSCOPY  2000  ? negative; [place]  ? [place]  ? MELANOMA EXCISION  08/2014  ? ? ?Allergies  ?Allergen Reactions  ? Sulfonamide Derivatives   ?  REACTION:generalized  swelling  ? Gabapentin Hypertension and Swelling  ? Sulfa Antibiotics Swelling  ? ? ?Outpatient Encounter Medications as of 08/28/2021  ?Medication Sig  ? acetaminophen (TYLENOL) 325 MG tablet Take 325 mg by mouth at bedtime. And q 4 prn  ? haloperidol (HALDOL) 0.5 MG tablet Take 0.5 mg by mouth every 6 (six) hours as needed. And every 6 hours as needed  ? Melatonin 5 MG CAPS Take by  mouth.  ? polyethylene glycol (MIRALAX / GLYCOLAX) 17 g packet Take 17 g by mouth daily.   ? QUEtiapine (SEROQUEL) 50 MG tablet Take 50 mg by mouth 2 (two) times daily.  ? ?No facility-administered encounter medications on file as of 08/28/2021.  ? ? ?Review of Systems  ?Unable to perform ROS: Dementia  ? ?Immunization History  ?Administered Date(s) Administered  ?  Influenza-Unspecified 03/20/2020  ? Moderna SARS-COV2 Booster Vaccination 04/05/2020  ? Moderna Sars-Covid-2 Vaccination 06/29/2019, 07/27/2019  ? PPD Test 04/29/2016  ? Tdap 08/11/2014  ? ?Pertinent  Health Maintenance Due  ?Topic Date Due  ? DEXA SCAN  Completed  ? INFLUENZA VACCINE  Discontinued  ? ? ?  09/16/2018  ? 11:11 AM 01/13/2019  ? 11:05 AM 03/03/2020  ?  3:35 PM 10/19/2020  ? 11:38 AM 11/16/2020  ? 10:59 AM  ?Fall Risk  ?Falls in the past year? 0 0 1 Exclusion - non ambulatory 0  ?Was there an injury with Fall? 0 0 1  0  ?Fall Risk Category Calculator 0 0 2  0  ?Fall Risk Category Low Low Moderate  Low  ?Patient Fall Risk Level Low fall risk Low fall risk Moderate fall risk  Moderate fall risk  ?Patient at Risk for Falls Due to   History of fall(s);Impaired balance/gait Impaired balance/gait;History of fall(s) History of fall(s);Impaired balance/gait  ?Fall risk Follow up   Falls evaluation completed Falls evaluation completed Falls evaluation completed  ? ?Functional Status Survey: ?  ? ?Vitals:  ? 08/28/21 0920  ?BP: 124/78  ?Pulse: 65  ?Resp: 16  ?Temp: (!) 97.5 ?F (36.4 ?C)  ?SpO2: 97%  ?Weight: 122 lb 9.6 oz (55.6 kg)  ?Height: '5\' 4"'$  (1.626 m)  ? ?Body mass index is 21.04 kg/m?Marland Kitchen ?Physical Exam ?Vitals reviewed.  ?HENT:  ?   Head: Normocephalic.  ?   Right Ear: There is no impacted cerumen.  ?   Left Ear: There is no impacted cerumen.  ?   Nose: Nose normal.  ?   Mouth/Throat:  ?   Mouth: Mucous membranes are moist.  ?Eyes:  ?   General:     ?   Right eye: No discharge.     ?   Left eye: No discharge.  ?Neck:  ?   Vascular: No carotid bruit.  ?Cardiovascular:  ?   Rate and Rhythm: Normal rate and regular rhythm.  ?   Pulses: Normal pulses.  ?   Heart sounds: Murmur heard.  ?Pulmonary:  ?   Effort: Pulmonary effort is normal. No respiratory distress.  ?   Breath sounds: Normal breath sounds. No wheezing.  ?Abdominal:  ?   General: Bowel sounds are normal. There is no distension.  ?   Palpations: Abdomen is  soft.  ?   Tenderness: There is no abdominal tenderness.  ?Musculoskeletal:  ?   Cervical back: Neck supple.  ?   Right lower leg: No edema.  ?   Left lower leg: No edema.  ?Lymphadenopathy:  ?   Cervical: No cervical adenopathy.  ?Skin: ?   General: Skin is warm and dry.  ?   Capillary Refill: Capillary refill takes less than 2 seconds.  ?   Comments: Small pea sized lesion to right buttock < 2cm, open with slight tunneling < 2cm, purulent drainage, no odor, surrounding skin intact. Senile purpura noted to BLE, size varies, non tender.   ?Neurological:  ?   General: No focal deficit present.  ?  Mental Status: She is alert. Mental status is at baseline.  ?   Motor: Weakness present.  ?   Gait: Gait abnormal.  ?Psychiatric:     ?   Mood and Affect: Mood normal.     ?   Behavior: Behavior normal.     ?   Cognition and Memory: Memory is impaired.  ? ? ?Labs reviewed: ?No results for input(s): NA, K, CL, CO2, GLUCOSE, BUN, CREATININE, CALCIUM, MG, PHOS in the last 8760 hours. ?No results for input(s): AST, ALT, ALKPHOS, BILITOT, PROT, ALBUMIN in the last 8760 hours. ?No results for input(s): WBC, NEUTROABS, HGB, HCT, MCV, PLT in the last 8760 hours. ?Lab Results  ?Component Value Date  ? TSH 2.06 08/07/2020  ? ?No results found for: HGBA1C ?Lab Results  ?Component Value Date  ? CHOL 199 08/12/2014  ? HDL 87 08/12/2014  ? LDLCALC 104 (H) 08/12/2014  ? LDLDIRECT 105.9 06/15/2013  ? TRIG 38 08/12/2014  ? CHOLHDL 2.3 08/12/2014  ? ? ?Significant Diagnostic Results in last 30 days:  ?No results found. ? ?Assessment/Plan ?1. 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 ?2. Pressure injury of right buttock, stage 2 ([facility]) ?- was raised nodule a few months back ?- now open ?- cont air mattress ?- recommend covering with Allevyn pink foam dressing over gauze and tape ? ?3. Parkinson's disease ([place]) ?- off  sinemet ?- cont skilled nursing ? ?4. Acquired hypothyroidism ?- off levothyroxine per hospice ? ?5. Primary hypertension ?- controlled without medication ? ?6. Gastroesophageal reflux disease, unspecified whether esophagiti

## 2021-09-25 ENCOUNTER — Non-Acute Institutional Stay (SKILLED_NURSING_FACILITY): Payer: Medicare Other | Admitting: Orthopedic Surgery

## 2021-09-25 ENCOUNTER — Encounter: Payer: Self-pay | Admitting: Orthopedic Surgery

## 2021-09-25 DIAGNOSIS — K5909 Other constipation: Secondary | ICD-10-CM

## 2021-09-25 DIAGNOSIS — G20 Parkinson's disease: Secondary | ICD-10-CM | POA: Diagnosis not present

## 2021-09-25 DIAGNOSIS — F0280 Dementia in other diseases classified elsewhere without behavioral disturbance: Secondary | ICD-10-CM

## 2021-09-25 DIAGNOSIS — R634 Abnormal weight loss: Secondary | ICD-10-CM | POA: Diagnosis not present

## 2021-09-25 DIAGNOSIS — L89312 Pressure ulcer of right buttock, stage 2: Secondary | ICD-10-CM | POA: Diagnosis not present

## 2021-09-25 NOTE — Progress Notes (Signed)
?Location:  [facility] ?Nursing Home Room Number: 107/A ?Place of Service:  SNF (31) ?Provider: Yvonna Alanis, NP ? ? ?Patient Care Team: ?Royal Hawthorn, NP as PCP - General (Nurse Practitioner) ?Jola Schmidt, MD as Consulting Physician (Ophthalmology) ?Ludwig Clarks, DO as Consulting Physician (Neurology) ?Martinique, Peter M, MD as Consulting Physician (Cardiology) ? ?Extended Emergency Contact Information ?Primary Emergency Contact: Patteson,Tonya ?Address: [address] ?         [place], [place] [address] [place] ?Home Phone: [phone] ?Mobile Phone: [phone] ?Relation: Daughter ? ?Code Status:  DNR ?Goals of care: Advanced Directive information ? ?  09/25/2021  ? 10:06 AM  ?Advanced Directives  ?Does Patient Have a Medical Advance Directive? Yes  ?Type of Advance Directive Out of facility DNR (pink MOST or yellow form);Living will  ?Does patient want to make changes to medical advance directive? No - Patient declined  ?Pre-existing out of facility DNR order (yellow form or pink MOST form) Yellow form placed in chart (order not valid for inpatient use);Pink MOST form placed in chart (order not valid for inpatient use)  ? ? ? ?Chief Complaint  ?Patient presents with  ? Medical Management of Chronic Issues  ?  Routine visit.  ? ? ?HPI:  ?Pt is a 88 y.o. female seen today for medical management of chronic diseases.   ? ?She currently resides on the skilled nursing unit at [facility] PMH: aortic stenosis, HTN, HLD, parkinson's, hypothyroidism, melanoma, osteoporosis, peripheral neuropathy, Gilberts syndrome, and constipation. ?  ?Weight loss- see trends below, not skipping meals, eating on average 50% of most meals ?Pressure ulcer- began as nodule to right buttocks a few months ago- opened sore >1 month, slow healing, covered with gauze and paper tape per family request, dressing changes QOD ?Dementia- behavioral outbursts associated with ADLs, remains on seroquel and  scheduled haldol, prn Haldol discontinued due to nonuse ?Parkinson's- followed by hospice, ambulates with broda chair, able to feed self, sinemet discontinued 05/10/2021 by hospice ?Constipation- LBM 05/01, remains on miralax daily ? ?No recent falls or injuries. ? ?Recent weights: ? 05/01- 120.8 lbs ? 03/01- 122.6 lbs ? 12/01- 125.2 lbs ? ?Recent blood pressures: ? 04/25- 144/83 ? 04/11- 160/98 ? 04/04- 144/81 ?Past Medical History:  ?Diagnosis Date  ? Aortic stenosis, mild   ? Constipation, chronic   ? Rosanna Randy syndrome   ? HTN (hypertension)   ? Hyperlipidemia   ? Hypothyroidism   ? Melanoma ([place])   ? Neuropathy   ? Osteoporosis   ? Parkinson's disease ([place])   ? Peripheral neuropathy   ? Bilateral  ? Rheumatic fever   ? does not take Flu shot   ? ?Past Surgical History:  ?Procedure Laterality Date  ? ABDOMINAL HYSTERECTOMY  1963  ? no BSO; no abnormal PAPs  ? COLONOSCOPY  2000  ? negative; [place]  ? [place]  ? MELANOMA EXCISION  08/2014  ? ? ?Allergies  ?Allergen Reactions  ? Sulfonamide Derivatives   ?  REACTION:generalized  swelling  ? Gabapentin Hypertension and Swelling  ? Sulfa Antibiotics Swelling  ? ? ?Outpatient Encounter Medications as of 09/25/2021  ?Medication Sig  ? acetaminophen (TYLENOL) 325 MG tablet Take 325 mg by mouth at bedtime. And q 4 prn  ? haloperidol (HALDOL) 0.5 MG tablet Take 1 tablet (0.5 mg total) by mouth 2 (two) times daily.  ? Melatonin 5 MG CAPS Take by mouth.  ? polyethylene glycol (MIRALAX / GLYCOLAX) 17 g packet  Take 17 g by mouth daily.   ? QUEtiapine (SEROQUEL) 50 MG tablet Take 50 mg by mouth 2 (two) times daily.  ? [DISCONTINUED] haloperidol (HALDOL) 0.5 MG tablet Take 0.5 mg by mouth daily as needed. And every 6 hours as needed  ? ?No facility-administered encounter medications on file as of 09/25/2021.  ? ? ?Review of Systems  ?Unable to perform ROS: Dementia  ? ?Immunization History  ?Administered Date(s) Administered  ? Influenza-Unspecified 03/20/2020  ?  Moderna SARS-COV2 Booster Vaccination 04/05/2020  ? Moderna Sars-Covid-2 Vaccination 06/29/2019, 07/27/2019  ? PPD Test 04/29/2016  ? Tdap 08/11/2014  ? ?Pertinent  Health Maintenance Due  ?Topic Date Due  ? DEXA SCAN  Completed  ? INFLUENZA VACCINE  Discontinued  ? ? ?  09/16/2018  ? 11:11 AM 01/13/2019  ? 11:05 AM 03/03/2020  ?  3:35 PM 10/19/2020  ? 11:38 AM 11/16/2020  ? 10:59 AM  ?Fall Risk  ?Falls in the past year? 0 0 1 Exclusion - non ambulatory 0  ?Was there an injury with Fall? 0 0 1  0  ?Fall Risk Category Calculator 0 0 2  0  ?Fall Risk Category Low Low Moderate  Low  ?Patient Fall Risk Level Low fall risk Low fall risk Moderate fall risk  Moderate fall risk  ?Patient at Risk for Falls Due to   History of fall(s);Impaired balance/gait Impaired balance/gait;History of fall(s) History of fall(s);Impaired balance/gait  ?Fall risk Follow up   Falls evaluation completed Falls evaluation completed Falls evaluation completed  ? ?Functional Status Survey: ?  ? ?Vitals:  ? 09/25/21 1003  ?BP: (!) 144/83  ?Pulse: 70  ?Resp: 20  ?Temp: (!) 97 ?F (36.1 ?C)  ?SpO2: 95%  ?Weight: 120 lb 12.8 oz (54.8 kg)  ?Height: '5\' 4"'$  (1.626 m)  ? ?Body mass index is 20.74 kg/m?Marland Kitchen ?Physical Exam ?Vitals reviewed.  ?Constitutional:   ?   General: She is not in acute distress. ?HENT:  ?   Head: Normocephalic.  ?   Right Ear: There is no impacted cerumen.  ?   Left Ear: There is no impacted cerumen.  ?   Nose: Nose normal.  ?   Mouth/Throat:  ?   Mouth: Mucous membranes are moist.  ?Eyes:  ?   General:     ?   Right eye: No discharge.     ?   Left eye: No discharge.  ?Cardiovascular:  ?   Rate and Rhythm: Normal rate and regular rhythm.  ?   Pulses: Normal pulses.  ?   Heart sounds: Murmur heard.  ?Pulmonary:  ?   Effort: Pulmonary effort is normal. No respiratory distress.  ?   Breath sounds: Normal breath sounds. No wheezing.  ?Abdominal:  ?   General: Bowel sounds are normal. There is no distension.  ?   Palpations: Abdomen is soft.  ?    Tenderness: There is no abdominal tenderness.  ?Musculoskeletal:  ?   Cervical back: Neck supple.  ?   Right lower leg: No edema.  ?   Left lower leg: No edema.  ?Skin: ?   General: Skin is warm and dry.  ?   Capillary Refill: Capillary refill takes less than 2 seconds.  ?   Comments: Small pea sized lesion to right buttock < 2cm, tunneling < 2cm, no purulent drainage or odor, surrounding skin intact  ?Neurological:  ?   General: No focal deficit present.  ?   Mental Status: She is alert. Mental  status is at baseline.  ?   Motor: Weakness present.  ?   Gait: Gait abnormal.  ?   Comments: Broda chair  ?Psychiatric:     ?   Mood and Affect: Mood normal.     ?   Behavior: Behavior normal.     ?   Cognition and Memory: Memory is impaired.  ?   Comments: Very pleasant, follows some commands, alert to self and familiar face  ? ? ?Labs reviewed: ?No results for input(s): NA, K, CL, CO2, GLUCOSE, BUN, CREATININE, CALCIUM, MG, PHOS in the last 8760 hours. ?No results for input(s): AST, ALT, ALKPHOS, BILITOT, PROT, ALBUMIN in the last 8760 hours. ?No results for input(s): WBC, NEUTROABS, HGB, HCT, MCV, PLT in the last 8760 hours. ?Lab Results  ?Component Value Date  ? TSH 2.06 08/07/2020  ? ?No results found for: HGBA1C ?Lab Results  ?Component Value Date  ? CHOL 199 08/12/2014  ? HDL 87 08/12/2014  ? LDLCALC 104 (H) 08/12/2014  ? LDLDIRECT 105.9 06/15/2013  ? TRIG 38 08/12/2014  ? CHOLHDL 2.3 08/12/2014  ? ? ?Significant Diagnostic Results in last 30 days:  ?No results found. ? ?Assessment/Plan ?1. Weight loss ?- trending down, losing about 1 lb/month ?- cont monthly weights ? ?2. Pressure injury of right buttock, stage 2 ([place]) ?- slow healing, size unchanged from last month ?- cont air mattress ?- cont dressing changes QOD ? ?3. Dementia associated with Parkinson's disease ([place]) ?- followed by hospice ?- cont skilled nursing  ?- cont scheduled Haldol and Seroquel ? ?4. Parkinson's disease ([place]) ?- off sinemet ?- cont  skilled nursing ? ?5. Chronic constipation ?- LBM 05/01, abdomen soft ?- cont miralax ? ? ? ?Family/ staff Communication: plan dicussed with patient and nurse ? ?Labs/tests ordered:  none ? ? ? ?

## 2021-10-29 ENCOUNTER — Non-Acute Institutional Stay (SKILLED_NURSING_FACILITY): Admitting: Internal Medicine

## 2021-10-29 ENCOUNTER — Encounter: Payer: Self-pay | Admitting: Internal Medicine

## 2021-10-29 DIAGNOSIS — F0280 Dementia in other diseases classified elsewhere without behavioral disturbance: Secondary | ICD-10-CM

## 2021-10-29 DIAGNOSIS — E039 Hypothyroidism, unspecified: Secondary | ICD-10-CM | POA: Diagnosis not present

## 2021-10-29 DIAGNOSIS — I10 Essential (primary) hypertension: Secondary | ICD-10-CM | POA: Diagnosis not present

## 2021-10-29 DIAGNOSIS — G20 Parkinson's disease: Secondary | ICD-10-CM

## 2021-10-29 NOTE — Progress Notes (Signed)
Location:  [facility] of Service:  SNF (31)  Provider:   Code Status: Hospice Goals of Care:     09/25/2021   10:06 AM  Advanced Directives  Does Patient Have a Medical Advance Directive? Yes  Type of Advance Directive Out of facility DNR (pink MOST or yellow form);Living will  Does patient want to make changes to medical advance directive? No - Patient declined  Pre-existing out of facility DNR order (yellow form or pink MOST form) Yellow form placed in chart (order not valid for inpatient use);Pink MOST form placed in chart (order not valid for inpatient use)     Chief Complaint  Patient presents with   Medical Management of Chronic Issues    HPI: Patient is a 88 y.o. female seen today for medical management of chronic diseases.    Lives in SNF Patient has h/o AS, H/o Parkinson With Dementia and Behavior issues with Lutricia Horsfall, h/o Neuropathy HTN, HLD, Hypothyroid, Rosanna Randy Syndrome     She is stable. No new Nursing issues. No Behavior issues Her weight is stable Dependent for her ADLS Hoyer Lift and Needs help with Feeding No Falls Had Pressure wound in her Bottom which is now almost resolved Wt Readings from Last 3 Encounters:  10/29/21 122 lb (55.3 kg)  09/25/21 120 lb 12.8 oz (54.8 kg)  08/28/21 122 lb 9.6 oz (55.6 kg)    Past Medical History:  Diagnosis Date   Aortic stenosis, mild    Constipation, chronic    Gilbert syndrome    HTN (hypertension)    Hyperlipidemia    Hypothyroidism    Melanoma ([place])    Neuropathy    Osteoporosis    Parkinson's disease ([place])    Peripheral neuropathy    Bilateral   Rheumatic fever    does not take Flu shot     Past Surgical History:  Procedure Laterality Date   ABDOMINAL HYSTERECTOMY  1963   no BSO; no abnormal PAPs   COLONOSCOPY  2000   negative; [place]  08/2014    Allergies  Allergen Reactions   Sulfonamide Derivatives      REACTION:generalized  swelling   Gabapentin Hypertension and Swelling   Sulfa Antibiotics Swelling    Outpatient Encounter Medications as of 10/29/2021  Medication Sig   acetaminophen (TYLENOL) 325 MG tablet Take 325 mg by mouth at bedtime. And q 4 prn   haloperidol (HALDOL) 0.5 MG tablet Take 1 tablet (0.5 mg total) by mouth 2 (two) times daily.   Melatonin 5 MG CAPS Take by mouth.   polyethylene glycol (MIRALAX / GLYCOLAX) 17 g packet Take 17 g by mouth daily.    QUEtiapine (SEROQUEL) 50 MG tablet Take 50 mg by mouth 2 (two) times daily.   No facility-administered encounter medications on file as of 10/29/2021.    Review of Systems:  Review of Systems  Unable to perform ROS: Dementia   Health Maintenance  Topic Date Due   Zoster Vaccines- Shingrix (1 of 2) Never done   COVID-19 Vaccine (3 - Moderna risk series) 04/17/2022 (Originally 05/03/2020)   Pneumonia Vaccine 49+ Years old (1 - PCV) 06/29/2022 (Originally 12/28/1998)   TETANUS/TDAP  08/10/2024   DEXA SCAN  Completed   HPV VACCINES  Aged Out   INFLUENZA VACCINE  Discontinued    Physical Exam: Vitals:   10/29/21 1527  BP: 139/64  Pulse: 68  Resp: 16  Temp: (!)  97.2 F (36.2 C)  SpO2: 93%  Weight: 122 lb (55.3 kg)   Body mass index is 20.94 kg/m. Physical Exam Vitals reviewed.  Constitutional:      Appearance: Normal appearance.  HENT:     Head: Normocephalic.     Nose: Nose normal.     Mouth/Throat:     Mouth: Mucous membranes are moist.     Pharynx: Oropharynx is clear.  Eyes:     Pupils: Pupils are equal, round, and reactive to light.  Cardiovascular:     Rate and Rhythm: Normal rate and regular rhythm.     Pulses: Normal pulses.     Heart sounds: Murmur heard.  Pulmonary:     Effort: Pulmonary effort is normal.     Breath sounds: Normal breath sounds.  Abdominal:     General: Abdomen is flat. Bowel sounds are normal.     Palpations: Abdomen is soft.  Musculoskeletal:        General: No swelling.      Cervical back: Neck supple.  Skin:    General: Skin is warm.  Neurological:     Mental Status: She is alert.     Comments: Responds with One word Has contracture in her Arms and Mild Tremor  Psychiatric:        Mood and Affect: Mood normal.        Thought Content: Thought content normal.    Labs reviewed: Basic Metabolic Panel: No results for input(s): NA, K, CL, CO2, GLUCOSE, BUN, CREATININE, CALCIUM, MG, PHOS, TSH in the last 8760 hours. Liver Function Tests: No results for input(s): AST, ALT, ALKPHOS, BILITOT, PROT, ALBUMIN in the last 8760 hours. No results for input(s): LIPASE, AMYLASE in the last 8760 hours. No results for input(s): AMMONIA in the last 8760 hours. CBC: No results for input(s): WBC, NEUTROABS, HGB, HCT, MCV, PLT in the last 8760 hours. Lipid Panel: No results for input(s): CHOL, HDL, LDLCALC, TRIG, CHOLHDL, LDLDIRECT in the last 8760 hours. No results found for: HGBA1C  Procedures since last visit: No results found.  Assessment/Plan 1. Dementia associated with Parkinson's disease ([place]) Continues care with Hospice Off all her meds  2. Parkinson's disease ([place]) Off all her meds Now End stage   3. Acquired hypothyroidism Off Meds  4. Primary hypertension Off Norvasc    Labs/tests ordered:  * No order type specified * Next appt:  Visit date not found

## 2021-11-26 ENCOUNTER — Non-Acute Institutional Stay (SKILLED_NURSING_FACILITY): Payer: Medicare Other | Admitting: Adult Health

## 2021-11-26 ENCOUNTER — Encounter: Payer: Self-pay | Admitting: Adult Health

## 2021-11-26 DIAGNOSIS — G20 Parkinson's disease: Secondary | ICD-10-CM | POA: Diagnosis not present

## 2021-11-26 DIAGNOSIS — E039 Hypothyroidism, unspecified: Secondary | ICD-10-CM

## 2021-11-26 DIAGNOSIS — F0280 Dementia in other diseases classified elsewhere without behavioral disturbance: Secondary | ICD-10-CM

## 2021-11-26 DIAGNOSIS — I872 Venous insufficiency (chronic) (peripheral): Secondary | ICD-10-CM

## 2021-11-26 DIAGNOSIS — K5909 Other constipation: Secondary | ICD-10-CM

## 2021-11-26 NOTE — Progress Notes (Signed)
Location:    [facility] Room Number: 107 Place of Service:  SNF ([phone]) Provider:  Royal Hawthorn, NP  Royal Hawthorn, NP  Patient Care Team: Royal Hawthorn, NP as PCP - General (Nurse Practitioner) Jola Schmidt, MD as Consulting Physician (Ophthalmology) Tat, Eustace Quail, DO as Consulting Physician (Neurology) Martinique, Peter M, MD as Consulting Physician (Cardiology)  Extended Emergency Contact Information Primary Emergency Contact: Rathmann,Tonya Address: [address]          [place],  [address] Johnnette Litter of [place] Phone: [phone] Mobile Phone: [phone] Relation: Daughter  Code Status:  DNR Hospice Goals of care: Advanced Directive information    11/26/2021    3:15 PM  Advanced Directives  Does Patient Have a Medical Advance Directive? Yes  Type of Paramedic of [place];Out of facility DNR (pink MOST or yellow form)  Does patient want to make changes to medical advance directive? No - Patient declined  Copy of [place] in Chart? Yes - validated most recent copy scanned in chart (See row information)  Pre-existing out of facility DNR order (yellow form or pink MOST form) Yellow form placed in chart (order not valid for inpatient use);Pink MOST form placed in chart (order not valid for inpatient use)     Chief Complaint  Patient presents with   Medical Management of Chronic Issues   Quality Metric Gaps    Verified matrix and [facility] patient is due for shingrix.    HPI:  Pt is a 88 y.o. female seen today for medical management of chronic diseases.    PMH significant for HTN, venous insuff, aortic stenosis, GERD, constipation, hypothyroidism, PD, dementia, OP, vit def, HLD, carotid bruit, gilbert's syndrome.   Weight is the same over the past few months. Only on meds for comfort. Has a broda chair and air mattress.  Wt Readings from Last 3 Encounters:  11/26/21 120 lb  6.4 oz (54.6 kg)  10/29/21 122 lb (55.3 kg)  09/25/21 120 lb 12.8 oz (54.8 kg)   No reports of pain. Remains on tylenol at bedtime.  Skin concerns: skin tear to left hand with polymem in place. Wears geri sleeves. Also has right buttock wound with 4 x 4 dressing. Drinks juven for nutritional support.   Takes haldol and seroquel for agitation and anxiety. Very calm and pleasant for todays visit.   Past Medical History:  Diagnosis Date   Aortic stenosis, mild    Constipation, chronic    Gilbert syndrome    HTN (hypertension)    Hyperlipidemia    Hypothyroidism    Melanoma (HCC)    Neuropathy    Osteoporosis    Parkinson's disease ([place])    Peripheral neuropathy    Bilateral   Rheumatic fever    does not take Flu shot    Past Surgical History:  Procedure Laterality Date   ABDOMINAL HYSTERECTOMY  1963   no BSO; no abnormal PAPs   COLONOSCOPY  2000   negative; [place]  08/2014    Allergies  Allergen Reactions   Sulfonamide Derivatives     REACTION:generalized  swelling   Gabapentin Hypertension and Swelling   Sulfa Antibiotics Swelling    Allergies as of 11/26/2021       Reactions   Sulfonamide Derivatives    REACTION:generalized  swelling   Gabapentin Hypertension, Swelling   Sulfa Antibiotics Swelling  Medication List        Accurate as of November 26, 2021  3:16 PM. If you have any questions, ask your nurse or doctor.          acetaminophen 325 MG tablet Commonly known as: TYLENOL Take 325 mg by mouth at bedtime. And q 4 prn   haloperidol 0.5 MG tablet Commonly known as: HALDOL Take 0.5 mg by mouth as needed for agitation.   haloperidol 0.5 MG tablet Commonly known as: HALDOL Take 1 tablet (0.5 mg total) by mouth 2 (two) times daily.   Melatonin 5 MG Caps Take by mouth.   polyethylene glycol 17 g packet Commonly known as: MIRALAX / GLYCOLAX Take 17 g by mouth daily.   QUEtiapine 50 MG  tablet Commonly known as: SEROQUEL Take 50 mg by mouth 2 (two) times daily.        Review of Systems  Unable to perform ROS: Dementia    Immunization History  Administered Date(s) Administered   Influenza-Unspecified 03/20/2020   Moderna SARS-COV2 Booster Vaccination 04/05/2020   Moderna Sars-Covid-2 Vaccination 06/29/2019, 07/27/2019   PPD Test 04/29/2016   Tdap 08/11/2014   Pertinent  Health Maintenance Due  Topic Date Due   DEXA SCAN  Completed   INFLUENZA VACCINE  Discontinued      09/16/2018   11:11 AM 01/13/2019   11:05 AM 03/03/2020    3:35 PM 10/19/2020   11:38 AM 11/16/2020   10:59 AM  Fall Risk  Falls in the past year? 0 0 1 Exclusion - non ambulatory 0  Was there an injury with Fall? 0 0 1  0  Fall Risk Category Calculator 0 0 2  0  Fall Risk Category Low Low Moderate  Low  Patient Fall Risk Level Low fall risk Low fall risk Moderate fall risk  Moderate fall risk  Patient at Risk for Falls Due to   History of fall(s);Impaired balance/gait Impaired balance/gait;History of fall(s) History of fall(s);Impaired balance/gait  Fall risk Follow up   Falls evaluation completed Falls evaluation completed Falls evaluation completed   Functional Status Survey:    Vitals:   11/26/21 1504  BP: 127/79  Pulse: 65  Resp: 17  Temp: (!) 97.3 F (36.3 C)  SpO2: 90%  Weight: 120 lb 6.4 oz (54.6 kg)  Height: '5\' 4"'$  (1.626 m)   Body mass index is 20.67 kg/m. Physical Exam Vitals and nursing note reviewed.  Constitutional:      General: She is not in acute distress.    Appearance: She is not diaphoretic.  HENT:     Head: Normocephalic and atraumatic.  Neck:     Vascular: No JVD.  Cardiovascular:     Rate and Rhythm: Normal rate and regular rhythm.     Heart sounds: Murmur heard.  Pulmonary:     Effort: Pulmonary effort is normal. No respiratory distress.     Breath sounds: Normal breath sounds. No wheezing.  Abdominal:     General: Bowel sounds are normal. There  is no distension.     Palpations: Abdomen is soft.     Tenderness: There is no abdominal tenderness.  Musculoskeletal:     Right lower leg: No edema.     Left lower leg: No edema.  Skin:    General: Skin is warm and dry.     Comments: Very thin fragile skin    Neurological:     General: No focal deficit present.     Mental Status: She is alert.  Mental status is at baseline.     Labs reviewed: No results for input(s): "NA", "K", "CL", "CO2", "GLUCOSE", "BUN", "CREATININE", "CALCIUM", "MG", "PHOS" in the last 8760 hours. No results for input(s): "AST", "ALT", "ALKPHOS", "BILITOT", "PROT", "ALBUMIN" in the last 8760 hours. No results for input(s): "WBC", "NEUTROABS", "HGB", "HCT", "MCV", "PLT" in the last 8760 hours. Lab Results  Component Value Date   TSH 2.06 08/07/2020   No results found for: "HGBA1C" Lab Results  Component Value Date   CHOL 199 08/12/2014   HDL 87 08/12/2014   LDLCALC 104 (H) 08/12/2014   LDLDIRECT 105.9 06/15/2013   TRIG 38 08/12/2014   CHOLHDL 2.3 08/12/2014    Significant Diagnostic Results in last 30 days:  No results found.  Assessment/Plan  1. Dementia associated with Parkinson's disease ([place]) Progressive decline in cognition and physical function c/w the disease. Continue supportive care in the skilled environment. Hospice is managing her medications. Appears very comfortable.   2. Parkinson's disease ([place]) Non ambulatory, more rigidity Not current on meds. Comfort care.   3. Acquired hypothyroidism On hospice   4. Chronic constipation Bms recorded regularly On miralax.   5. Gilbert's syndrome No longer monitoring labs.   6. Chronic venous insufficiency Wears compression hose Legs are elevated when possible in bed.

## 2022-02-21 ENCOUNTER — Non-Acute Institutional Stay (SKILLED_NURSING_FACILITY): Payer: Medicare Other | Admitting: Adult Health

## 2022-02-21 ENCOUNTER — Encounter: Payer: Self-pay | Admitting: Adult Health

## 2022-02-21 DIAGNOSIS — G20 Parkinson's disease: Secondary | ICD-10-CM | POA: Diagnosis not present

## 2022-02-21 DIAGNOSIS — F0280 Dementia in other diseases classified elsewhere without behavioral disturbance: Secondary | ICD-10-CM | POA: Diagnosis not present

## 2022-02-21 DIAGNOSIS — R29898 Other symptoms and signs involving the musculoskeletal system: Secondary | ICD-10-CM | POA: Diagnosis not present

## 2022-02-21 MED ORDER — LORAZEPAM 2 MG/ML PO CONC: 0.5000 mg | Freq: Three times a day (TID) | ORAL | 1 refills | Status: AC

## 2022-02-21 NOTE — Progress Notes (Signed)
Location:  [facility] of Service:  SNF (31) Provider:   Cindi Carbon, [place] [phone]   Royal Hawthorn, NP  Patient Care Team: Royal Hawthorn, NP as PCP - General (Nurse Practitioner) Jola Schmidt, MD as Consulting Physician (Ophthalmology) Tat, Eustace Quail, DO as Consulting Physician (Neurology) [place]ique, Peter M, MD as Consulting Physician (Cardiology)  Extended Emergency Contact Information Primary Emergency Contact: Wedig,Tonya Address: [address]          [place], [place] [address] Johnnette Litter of [place] Phone: [phone] Mobile Phone: [phone] Relation: Daughter  Code Status:  DNR Goals of care: Advanced Directive information    11/26/2021    3:15 PM  Advanced Directives  Does Patient Have a Medical Advance Directive? Yes  Type of Paramedic of [place];Out of facility DNR (pink MOST or yellow form)  Does patient want to make changes to medical advance directive? No - Patient declined  Copy of [place] in Chart? Yes - validated most recent copy scanned in chart (See row information)  Pre-existing out of facility DNR order (yellow form or pink MOST form) Yellow form placed in chart (order not valid for inpatient use);Pink MOST form placed in chart (order not valid for inpatient use)     Chief Complaint  Patient presents with   Acute Visit    rigidity    HPI:  Pt is a 88 y.o. female seen today for an acute visit for rigidity.  She has end stage dementia and Parkinson's disease.  Resides in [facility] and under the care of hospice. She is no longer able to swallow. Not eating and drinking now. Now having increased rigidity and appears uncomfortable clinching teeth.  Currently using morphine every 4 hrs. No shortness of breath at this time.   Past Medical History:  Diagnosis Date   Aortic stenosis, mild    Constipation, chronic     Gilbert syndrome    HTN (hypertension)    Hyperlipidemia    Hypothyroidism    Melanoma (HCC)    Neuropathy    Osteoporosis    Parkinson's disease ([place])    Peripheral neuropathy    Bilateral   Rheumatic fever    does not take Flu shot    Past Surgical History:  Procedure Laterality Date   ABDOMINAL HYSTERECTOMY  1963   no BSO; no abnormal PAPs   COLONOSCOPY  2000   negative; [place]  08/2014    Allergies  Allergen Reactions   Sulfonamide Derivatives     REACTION:generalized  swelling   Gabapentin Hypertension and Swelling   Sulfa Antibiotics Swelling    Outpatient Encounter Medications as of 02/21/2022  Medication Sig   acetaminophen (TYLENOL) 650 MG suppository Place 650 mg rectally every 6 (six) hours as needed for fever.   morphine (ROXANOL) 20 MG/ML concentrated solution Take 5 mg by mouth every 4 (four) hours. And 5 mg q 2 prn sob or pain   [DISCONTINUED] LORazepam (ATIVAN) 2 MG/ML concentrated solution Take 0.5 mg by mouth in the morning, at noon, and at bedtime. And q 4 prn agitation or rigidity   haloperidol (HALDOL) 0.5 MG tablet Take 1 tablet (0.5 mg total) by mouth 2 (two) times daily.   haloperidol (HALDOL) 0.5 MG tablet Take 0.5 mg by mouth as needed for agitation.   LORazepam (ATIVAN) 2 MG/ML concentrated solution Take 0.3 mLs (0.6  mg total) by mouth in the morning, at noon, and at bedtime. Please give 0.5 mg three times daily and 0.5 mg And q 4 prn agitation or rigidity   [DISCONTINUED] acetaminophen (TYLENOL) 325 MG tablet Take 325 mg by mouth at bedtime. And q 4 prn   [DISCONTINUED] Melatonin 5 MG CAPS Take by mouth.   [DISCONTINUED] polyethylene glycol (MIRALAX / GLYCOLAX) 17 g packet Take 17 g by mouth daily.    [DISCONTINUED] QUEtiapine (SEROQUEL) 50 MG tablet Take 50 mg by mouth 2 (two) times daily.   No facility-administered encounter medications on file as of 02/21/2022.    Review of Systems  Unable to  perform ROS: Acuity of condition    Immunization History  Administered Date(s) Administered   Influenza-Unspecified 03/20/2020   Moderna SARS-COV2 Booster Vaccination 04/05/2020   Moderna Sars-Covid-2 Vaccination 06/29/2019, 07/27/2019   PPD Test 04/29/2016   Tdap 08/11/2014   Pertinent  Health Maintenance Due  Topic Date Due   DEXA SCAN  Completed   INFLUENZA VACCINE  Discontinued      09/16/2018   11:11 AM 01/13/2019   11:05 AM 03/03/2020    3:35 PM 10/19/2020   11:38 AM 11/16/2020   10:59 AM  Fall Risk  Falls in the past year? 0 0 1 Exclusion - non ambulatory 0  Was there an injury with Fall? 0 0 1  0  Fall Risk Category Calculator 0 0 2  0  Fall Risk Category Low Low Moderate  Low  Patient Fall Risk Level Low fall risk Low fall risk Moderate fall risk  Moderate fall risk  Patient at Risk for Falls Due to   History of fall(s);Impaired balance/gait Impaired balance/gait;History of fall(s) History of fall(s);Impaired balance/gait  Fall risk Follow up   Falls evaluation completed Falls evaluation completed Falls evaluation completed   Functional Status Survey:    There were no vitals filed for this visit. There is no height or weight on file to calculate BMI. Physical Exam Constitutional:      Comments: Eyes closed  Cardiovascular:     Rate and Rhythm: Normal rate and regular rhythm.     Heart sounds: Murmur heard.  Pulmonary:     Effort: Pulmonary effort is normal.     Breath sounds: Normal breath sounds.  Abdominal:     General: Bowel sounds are normal. There is distension (mild and chronic).     Palpations: Abdomen is soft.  Neurological:     Comments: Asleep, makes myoclonic jerking motions Increased rigidity, decreased ROM to BUE and BLE     Labs reviewed: No results for input(s): "NA", "K", "CL", "CO2", "GLUCOSE", "BUN", "CREATININE", "CALCIUM", "MG", "PHOS" in the last 8760 hours. No results for input(s): "AST", "ALT", "ALKPHOS", "BILITOT", "PROT", "ALBUMIN"  in the last 8760 hours. No results for input(s): "WBC", "NEUTROABS", "HGB", "HCT", "MCV", "PLT" in the last 8760 hours. Lab Results  Component Value Date   TSH 2.06 08/07/2020   No results found for: "HGBA1C" Lab Results  Component Value Date   CHOL 199 08/12/2014   HDL 87 08/12/2014   LDLCALC 104 (H) 08/12/2014   LDLDIRECT 105.9 06/15/2013   TRIG 38 08/12/2014   CHOLHDL 2.3 08/12/2014    Significant Diagnostic Results in last 30 days:  No results found.  Assessment/Plan 1. Rigidity Off seroquel and sinemet Transitioning, having more rigidity. Will add ativan.   - LORazepam (ATIVAN) 2 MG/ML concentrated solution; Take 0.5 mg three times daily and q 4 prn  2. Dementia  associated with Parkinson's disease ([place]) End stage  Followed by hospice    Family/ staff Communication: discussed with Tonya POA  Labs/tests ordered:  NA

## 2022-03-27 DEATH — deceased
